# Patient Record
Sex: Female | Born: 1985 | Race: Black or African American | Hispanic: No | Marital: Single | Smoking: Current every day smoker
Health system: Southern US, Community
[De-identification: ages and names within clinical notes are randomized; demographics above are authoritative.]

## PROBLEM LIST (undated history)

## (undated) ENCOUNTER — Inpatient Hospital Stay (HOSPITAL_COMMUNITY): Payer: Self-pay

## (undated) DIAGNOSIS — R87629 Unspecified abnormal cytological findings in specimens from vagina: Secondary | ICD-10-CM

## (undated) DIAGNOSIS — K2970 Gastritis, unspecified, without bleeding: Secondary | ICD-10-CM

## (undated) DIAGNOSIS — R87619 Unspecified abnormal cytological findings in specimens from cervix uteri: Secondary | ICD-10-CM

## (undated) DIAGNOSIS — Z8619 Personal history of other infectious and parasitic diseases: Secondary | ICD-10-CM

## (undated) DIAGNOSIS — D649 Anemia, unspecified: Secondary | ICD-10-CM

## (undated) DIAGNOSIS — IMO0002 Reserved for concepts with insufficient information to code with codable children: Secondary | ICD-10-CM

## (undated) HISTORY — DX: Unspecified abnormal cytological findings in specimens from vagina: R87.629

## (undated) HISTORY — PX: HERNIA REPAIR: SHX51

## (undated) HISTORY — PX: LEEP: SHX91

## (undated) HISTORY — DX: Personal history of other infectious and parasitic diseases: Z86.19

## (undated) HISTORY — DX: Anemia, unspecified: D64.9

---
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
## 2003-03-26 ENCOUNTER — Emergency Department (HOSPITAL_COMMUNITY): Admission: AD | Admit: 2003-03-26 | Discharge: 2003-03-26 | Payer: Self-pay | Admitting: Family Medicine

## 2003-07-24 ENCOUNTER — Emergency Department (HOSPITAL_COMMUNITY): Admission: EM | Admit: 2003-07-24 | Discharge: 2003-07-24 | Payer: Self-pay | Admitting: Emergency Medicine

## 2003-10-25 ENCOUNTER — Inpatient Hospital Stay (HOSPITAL_COMMUNITY): Admission: AD | Admit: 2003-10-25 | Discharge: 2003-10-26 | Payer: Self-pay | Admitting: Family Medicine

## 2003-10-29 ENCOUNTER — Inpatient Hospital Stay (HOSPITAL_COMMUNITY): Admission: AD | Admit: 2003-10-29 | Discharge: 2003-10-30 | Payer: Self-pay | Admitting: Obstetrics and Gynecology

## 2003-10-30 ENCOUNTER — Observation Stay (HOSPITAL_COMMUNITY): Admission: AD | Admit: 2003-10-30 | Discharge: 2003-10-31 | Payer: Self-pay | Admitting: Obstetrics and Gynecology

## 2003-10-30 ENCOUNTER — Ambulatory Visit: Payer: Self-pay | Admitting: Obstetrics and Gynecology

## 2003-11-04 ENCOUNTER — Inpatient Hospital Stay (HOSPITAL_COMMUNITY): Admission: AD | Admit: 2003-11-04 | Discharge: 2003-11-04 | Payer: Self-pay | Admitting: Family Medicine

## 2003-11-06 ENCOUNTER — Inpatient Hospital Stay (HOSPITAL_COMMUNITY): Admission: AD | Admit: 2003-11-06 | Discharge: 2003-11-06 | Payer: Self-pay | Admitting: *Deleted

## 2004-01-10 ENCOUNTER — Ambulatory Visit (HOSPITAL_COMMUNITY): Admission: RE | Admit: 2004-01-10 | Discharge: 2004-01-10 | Payer: Self-pay | Admitting: *Deleted

## 2004-01-24 ENCOUNTER — Ambulatory Visit (HOSPITAL_COMMUNITY): Admission: RE | Admit: 2004-01-24 | Discharge: 2004-01-24 | Payer: Self-pay | Admitting: *Deleted

## 2004-02-02 ENCOUNTER — Inpatient Hospital Stay (HOSPITAL_COMMUNITY): Admission: AD | Admit: 2004-02-02 | Discharge: 2004-02-02 | Payer: Self-pay | Admitting: *Deleted

## 2004-03-25 ENCOUNTER — Inpatient Hospital Stay (HOSPITAL_COMMUNITY): Admission: AD | Admit: 2004-03-25 | Discharge: 2004-03-25 | Payer: Self-pay | Admitting: *Deleted

## 2004-04-03 ENCOUNTER — Ambulatory Visit: Payer: Self-pay | Admitting: Family Medicine

## 2004-04-17 ENCOUNTER — Ambulatory Visit: Payer: Self-pay | Admitting: Family Medicine

## 2004-04-28 ENCOUNTER — Ambulatory Visit (HOSPITAL_COMMUNITY): Admission: RE | Admit: 2004-04-28 | Discharge: 2004-04-28 | Payer: Self-pay | Admitting: *Deleted

## 2004-05-08 ENCOUNTER — Ambulatory Visit: Payer: Self-pay | Admitting: Family Medicine

## 2004-05-22 ENCOUNTER — Ambulatory Visit: Payer: Self-pay | Admitting: *Deleted

## 2004-05-29 ENCOUNTER — Ambulatory Visit: Payer: Self-pay | Admitting: Family Medicine

## 2004-06-05 ENCOUNTER — Ambulatory Visit: Payer: Self-pay | Admitting: Family Medicine

## 2004-06-12 ENCOUNTER — Ambulatory Visit: Payer: Self-pay | Admitting: Family Medicine

## 2004-06-16 ENCOUNTER — Ambulatory Visit: Payer: Self-pay | Admitting: *Deleted

## 2004-06-19 ENCOUNTER — Ambulatory Visit: Payer: Self-pay | Admitting: Family Medicine

## 2004-06-24 ENCOUNTER — Inpatient Hospital Stay (HOSPITAL_COMMUNITY): Admission: AD | Admit: 2004-06-24 | Discharge: 2004-06-28 | Payer: Self-pay | Admitting: Obstetrics and Gynecology

## 2004-06-24 ENCOUNTER — Ambulatory Visit: Payer: Self-pay | Admitting: Obstetrics and Gynecology

## 2004-07-04 ENCOUNTER — Inpatient Hospital Stay (HOSPITAL_COMMUNITY): Admission: AD | Admit: 2004-07-04 | Discharge: 2004-07-04 | Payer: Self-pay | Admitting: Obstetrics and Gynecology

## 2004-11-19 ENCOUNTER — Inpatient Hospital Stay (HOSPITAL_COMMUNITY): Admission: AD | Admit: 2004-11-19 | Discharge: 2004-11-19 | Payer: Self-pay | Admitting: Obstetrics and Gynecology

## 2005-04-03 ENCOUNTER — Inpatient Hospital Stay (HOSPITAL_COMMUNITY): Admission: AD | Admit: 2005-04-03 | Discharge: 2005-04-03 | Payer: Self-pay | Admitting: Obstetrics & Gynecology

## 2005-04-21 ENCOUNTER — Emergency Department (HOSPITAL_COMMUNITY): Admission: EM | Admit: 2005-04-21 | Discharge: 2005-04-21 | Payer: Self-pay | Admitting: Family Medicine

## 2005-07-26 ENCOUNTER — Inpatient Hospital Stay (HOSPITAL_COMMUNITY): Admission: AD | Admit: 2005-07-26 | Discharge: 2005-07-26 | Payer: Self-pay | Admitting: Obstetrics and Gynecology

## 2005-08-09 ENCOUNTER — Inpatient Hospital Stay (HOSPITAL_COMMUNITY): Admission: AD | Admit: 2005-08-09 | Discharge: 2005-08-09 | Payer: Self-pay | Admitting: Obstetrics and Gynecology

## 2005-10-15 ENCOUNTER — Ambulatory Visit (HOSPITAL_COMMUNITY): Admission: RE | Admit: 2005-10-15 | Discharge: 2005-10-15 | Payer: Self-pay | Admitting: Obstetrics

## 2006-02-23 ENCOUNTER — Inpatient Hospital Stay (HOSPITAL_COMMUNITY): Admission: AD | Admit: 2006-02-23 | Discharge: 2006-02-23 | Payer: Self-pay | Admitting: Obstetrics & Gynecology

## 2006-03-12 ENCOUNTER — Inpatient Hospital Stay (HOSPITAL_COMMUNITY): Admission: AD | Admit: 2006-03-12 | Discharge: 2006-03-12 | Payer: Self-pay | Admitting: Obstetrics

## 2006-03-24 ENCOUNTER — Inpatient Hospital Stay (HOSPITAL_COMMUNITY): Admission: AD | Admit: 2006-03-24 | Discharge: 2006-03-27 | Payer: Self-pay | Admitting: Obstetrics

## 2006-04-16 ENCOUNTER — Emergency Department (HOSPITAL_COMMUNITY): Admission: EM | Admit: 2006-04-16 | Discharge: 2006-04-16 | Payer: Self-pay | Admitting: Emergency Medicine

## 2006-09-12 ENCOUNTER — Emergency Department (HOSPITAL_COMMUNITY): Admission: EM | Admit: 2006-09-12 | Discharge: 2006-09-12 | Payer: Self-pay | Admitting: Emergency Medicine

## 2006-09-23 ENCOUNTER — Emergency Department (HOSPITAL_COMMUNITY): Admission: EM | Admit: 2006-09-23 | Discharge: 2006-09-23 | Payer: Self-pay | Admitting: Emergency Medicine

## 2007-07-08 ENCOUNTER — Emergency Department (HOSPITAL_COMMUNITY): Admission: EM | Admit: 2007-07-08 | Discharge: 2007-07-08 | Payer: Self-pay | Admitting: Emergency Medicine

## 2007-07-09 ENCOUNTER — Emergency Department (HOSPITAL_COMMUNITY): Admission: EM | Admit: 2007-07-09 | Discharge: 2007-07-09 | Payer: Self-pay | Admitting: Emergency Medicine

## 2007-11-27 ENCOUNTER — Emergency Department (HOSPITAL_COMMUNITY): Admission: EM | Admit: 2007-11-27 | Discharge: 2007-11-27 | Payer: Self-pay | Admitting: Family Medicine

## 2008-02-28 ENCOUNTER — Inpatient Hospital Stay (HOSPITAL_COMMUNITY): Admission: AD | Admit: 2008-02-28 | Discharge: 2008-02-28 | Payer: Self-pay | Admitting: Obstetrics & Gynecology

## 2008-08-28 ENCOUNTER — Inpatient Hospital Stay (HOSPITAL_COMMUNITY): Admission: AD | Admit: 2008-08-28 | Discharge: 2008-08-28 | Payer: Self-pay | Admitting: Obstetrics

## 2008-09-09 ENCOUNTER — Inpatient Hospital Stay (HOSPITAL_COMMUNITY): Admission: AD | Admit: 2008-09-09 | Discharge: 2008-09-09 | Payer: Self-pay | Admitting: Obstetrics & Gynecology

## 2008-09-09 ENCOUNTER — Ambulatory Visit: Payer: Self-pay | Admitting: Advanced Practice Midwife

## 2008-11-21 ENCOUNTER — Inpatient Hospital Stay (HOSPITAL_COMMUNITY): Admission: AD | Admit: 2008-11-21 | Discharge: 2008-11-21 | Payer: Self-pay | Admitting: Obstetrics

## 2008-11-27 ENCOUNTER — Emergency Department (HOSPITAL_COMMUNITY): Admission: EM | Admit: 2008-11-27 | Discharge: 2008-11-27 | Payer: Self-pay | Admitting: Emergency Medicine

## 2009-01-11 ENCOUNTER — Emergency Department (HOSPITAL_COMMUNITY): Admission: EM | Admit: 2009-01-11 | Discharge: 2009-01-11 | Payer: Self-pay | Admitting: Family Medicine

## 2009-03-07 ENCOUNTER — Emergency Department (HOSPITAL_COMMUNITY): Admission: EM | Admit: 2009-03-07 | Discharge: 2009-03-07 | Payer: Self-pay | Admitting: Emergency Medicine

## 2009-09-15 ENCOUNTER — Emergency Department (HOSPITAL_COMMUNITY)
Admission: EM | Admit: 2009-09-15 | Discharge: 2009-09-15 | Payer: Self-pay | Source: Home / Self Care | Admitting: Emergency Medicine

## 2010-04-16 LAB — CBC
HCT: 41.1 % (ref 36.0–46.0)
Hemoglobin: 13.7 g/dL (ref 12.0–15.0)
Platelets: 251 10*3/uL (ref 150–400)
RBC: 4.23 MIL/uL (ref 3.87–5.11)
WBC: 9.8 10*3/uL (ref 4.0–10.5)

## 2010-04-16 LAB — POCT PREGNANCY, URINE
Preg Test, Ur: NEGATIVE
Preg Test, Ur: NEGATIVE

## 2010-04-16 LAB — WET PREP, GENITAL
Trich, Wet Prep: NONE SEEN
Yeast Wet Prep HPF POC: NONE SEEN

## 2010-04-16 LAB — URINALYSIS, ROUTINE W REFLEX MICROSCOPIC
Bilirubin Urine: NEGATIVE
Bilirubin Urine: NEGATIVE
Glucose, UA: NEGATIVE mg/dL
Glucose, UA: NEGATIVE mg/dL
Ketones, ur: NEGATIVE mg/dL
Ketones, ur: NEGATIVE mg/dL
Nitrite: NEGATIVE
Nitrite: NEGATIVE
Protein, ur: 100 mg/dL — AB
Protein, ur: NEGATIVE mg/dL
Specific Gravity, Urine: 1.019 (ref 1.005–1.030)
Specific Gravity, Urine: 1.015 (ref 1.005–1.030)
Urobilinogen, UA: 1 mg/dL (ref 0.0–1.0)
Urobilinogen, UA: 0.2 mg/dL (ref 0.0–1.0)
pH: 7.5 (ref 5.0–8.0)
pH: 6.5 (ref 5.0–8.0)

## 2010-04-16 LAB — GC/CHLAMYDIA PROBE AMP, GENITAL
Chlamydia, DNA Probe: NEGATIVE
GC Probe Amp, Genital: NEGATIVE

## 2010-04-16 LAB — URINE MICROSCOPIC-ADD ON

## 2010-04-19 LAB — URINALYSIS, ROUTINE W REFLEX MICROSCOPIC
Bilirubin Urine: NEGATIVE
Nitrite: NEGATIVE
Specific Gravity, Urine: >1.03 — ABNORMAL HIGH (ref 1.005–1.030)
pH: 6 (ref 5.0–8.0)

## 2010-04-19 LAB — URINE CULTURE

## 2010-04-19 LAB — URINE MICROSCOPIC-ADD ON

## 2010-04-19 LAB — GC/CHLAMYDIA PROBE AMP, GENITAL: GC Probe Amp, Genital: POSITIVE — AB

## 2010-04-19 LAB — WET PREP, GENITAL: Yeast Wet Prep HPF POC: NONE SEEN

## 2010-04-29 LAB — URINALYSIS, ROUTINE W REFLEX MICROSCOPIC
Bilirubin Urine: NEGATIVE
Glucose, UA: NEGATIVE mg/dL
Hgb urine dipstick: NEGATIVE
Ketones, ur: NEGATIVE mg/dL
Nitrite: NEGATIVE
pH: 6.5 (ref 5.0–8.0)

## 2010-04-29 LAB — WET PREP, GENITAL
Clue Cells Wet Prep HPF POC: NONE SEEN
Trich, Wet Prep: NONE SEEN
Yeast Wet Prep HPF POC: NONE SEEN

## 2010-04-29 LAB — POCT PREGNANCY, URINE: Preg Test, Ur: NEGATIVE
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
## 2010-05-30 NOTE — Op Note (Signed)
[facility]  Patient:    Dawn Brock, Dawn Brock Visit Number: [id number] MRN: [id number]          Service Type: DSU Location: [facility] Attending Physician:  Jaymes Graff A Dictated by:   Pierre Bali Normand Sloop, M.D. Proc. Date: 04/28/01 Admit Date:  04/28/2001                             Operative Report  PREOPERATIVE DIAGNOSIS:       Cervical dysplasia.  POSTOPERATIVE DIAGNOSIS:      Cervical dysplasia.  OPERATION:                    1. Loop electrosurgical excision procedure.                               2. Endocervical curettage.  SURGEON:                      Naima A. Normand Sloop, M.D.  ANESTHESIA:                   MAC with 10 cc 1% lidocaine cervical block.  ESTIMATED BLOOD LOSS:         Minimal.  COMPLICATIONS:                None.  FINDINGS:                     Normal size uterus, mobile.  No adnexal masses. Vulva and vaginal exams within normal limits.  There was an abnormal lightening area with Lugol solution at 12, 1, and 6 oclock.  The patient went to the recovery room in stable condition.  DESCRIPTION OF PROCEDURE:     The patient was taken to the operating room where she was given MAC anesthesia with a cervical block after being placed in dorsolithotomy position and prepped and draped in the normal sterile fashion. A bivalve speculum was placed into the vagina.  The anterior lip of the cervix was grasped with a single-tooth tenaculum.  The 10 cc 1% lidocaine cervical block was administered.  Lugol solution was placed on the cervix.  The abnormal areas were consistent with colposcopy done in the office.  The abnormal areas were removed in entirety with loop cautery.  The endocervical curet was placed into the cervix, and endocervical curettage was done. Specimen was sent to pathology.  The cervical bed was made hemostatic using Bovie cautery and mild salt solution.  All instruments were removed from the vagina.  Tenaculum was removed from the  cervix with good hemostasis noted. Sponge counts were correct x 2.  The patient was taken to the recovery room in stable condition. Dictated by:   Pierre Bali. Normand Sloop, M.D. Attending Physician:  Michael Litter DD:  04/28/01 TD:  04/29/01 Job: [id number]

## 2010-05-30 NOTE — Discharge Summary (Signed)
NAME:  Dawn Brock, Dawn Brock   ACCOUNT NO.:  [id number]   MEDICAL RECORD NO.:  [id number]          PATIENT TYPE:  INP   LOCATION:  9313                          FACILITY:  [facility]   PHYSICIAN:  Phil D. Okey Dupre, M.D.     DATE OF BIRTH:  11/14/1985   DATE OF ADMISSION:  10/30/2003  DATE OF DISCHARGE:                                 DISCHARGE SUMMARY   ADMISSION DIAGNOSES:  1.  A 25 year old gravida 1 at 7 and 3 weeks with emesis status post      treatment for gonorrhea and chlamydia.  2.  No history of hyperemesis with this pregnancy.   DISCHARGE DIAGNOSES:  1.  A 25 year old gravida 1 at 7 and 3 weeks with emesis.  2.  Positive gonorrhea and chlamydia.   DISCHARGE MEDICATIONS:  1.  Phenergan 25 mg q.6h. p.r.n. #30 with one refill.  2.  Zofran 4 mg q.6h. p.r.n. #30 no refills.  3.  Prenatal vitamins one p.o. daily.   ADMISSION HISTORY:  Ms. [name] was admitted at 7 weeks with emesis.  She has no history of hyperemesis and was given a Zithromax slurry as well  as an IM injection of Rocephin and she promptly vomited and continued to  vomit throughout the day in maternity admissions.  She was admitted for  observation.  She was given IV fluids and Phenergan.  This seemed to help  her vomiting, although she had persistent nausea.   The patient was discharged to home in stable condition.   INSTRUCTIONS GIVEN TO PATIENT:  The patient was told of her above medical  regimen.  She should call [facility] for a prenatal appointment to start  prenatal care as soon as possible.   As far as her gonorrhea and chlamydia, her gonorrhea was appropriately  treated with Rocephin and she was then given a gram of Zithromax IV for  chlamydia prior to discharge.      LC/MEDQ  D:  10/31/2003  T:  10/31/2003  Job:  [id number]   cc:   [facility] on [address]

## 2010-08-09 ENCOUNTER — Inpatient Hospital Stay (HOSPITAL_COMMUNITY)
Admission: AD | Admit: 2010-08-09 | Discharge: 2010-08-09 | Disposition: A | Discharge status: Home / Self Care | Payer: Medicaid Other | Source: Ambulatory Visit | Attending: Obstetrics & Gynecology | Admitting: Obstetrics & Gynecology

## 2010-08-09 ENCOUNTER — Encounter (HOSPITAL_COMMUNITY): Payer: Self-pay | Admitting: *Deleted

## 2010-08-09 DIAGNOSIS — N898 Other specified noninflammatory disorders of vagina: Secondary | ICD-10-CM | POA: Insufficient documentation

## 2010-08-09 DIAGNOSIS — R109 Unspecified abdominal pain: Secondary | ICD-10-CM | POA: Insufficient documentation

## 2010-08-09 DIAGNOSIS — A599 Trichomoniasis, unspecified: Secondary | ICD-10-CM

## 2010-08-09 LAB — URINALYSIS, ROUTINE W REFLEX MICROSCOPIC
Ketones, ur: NEGATIVE mg/dL
Nitrite: NEGATIVE
Protein, ur: NEGATIVE mg/dL
Urobilinogen, UA: 1 mg/dL (ref 0.0–1.0)

## 2010-08-09 LAB — URINE MICROSCOPIC-ADD ON

## 2010-08-09 LAB — WET PREP, GENITAL: Yeast Wet Prep HPF POC: NONE SEEN

## 2010-08-09 LAB — CBC
MCH: 30.7 pg (ref 26.0–34.0)
MCHC: 32.8 g/dL (ref 30.0–36.0)
RDW: 14.3 % (ref 11.5–15.5)

## 2010-08-09 MED ORDER — NAPROXEN SODIUM 550 MG PO TABS: 550.0000 mg | ORAL_TABLET | Freq: Two times a day (BID) | ORAL | Status: DC

## 2010-08-09 MED ORDER — METRONIDAZOLE 500 MG PO TABS: 500.0000 mg | ORAL_TABLET | Freq: Two times a day (BID) | ORAL | Status: AC

## 2010-08-09 MED ORDER — KETOROLAC TROMETHAMINE 60 MG/2ML IM SOLN
60.0000 mg | Freq: Once | INTRAMUSCULAR | Status: AC
  Administered 2010-08-09: 60 mg via INTRAMUSCULAR
  Filled 2010-08-09: qty 2
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
## 2010-08-09 NOTE — Progress Notes (Signed)
Pt states, " I had a regular period 7-19 that lasted for 5 days having normal cramping pain, and then I restarted on Thurs and using about five pads per day. The cramping has never stopped from the 07-31-10. After my period stopped I had a yellow discharge."

## 2010-08-09 NOTE — ED Provider Notes (Signed)
Agree with above note.  Luise Yamamoto A 08/09/2010 9:18 PM   

## 2010-08-09 NOTE — Progress Notes (Signed)
Pt stateds she had  period on 7/19 and it lasted 5 days and started bleeding again 2 days ago

## 2010-08-11 NOTE — Treatment Plan (Signed)
Pt called for another RX of Flagyl, pt states she lost her RX. Flagyl 500mg  1 tab bid #14 No refills. Called into [facility] on [address].

## 2010-08-12 LAB — GC/CHLAMYDIA PROBE AMP, GENITAL: Chlamydia, DNA Probe: NEGATIVE

## 2010-09-14 ENCOUNTER — Emergency Department (HOSPITAL_COMMUNITY)
Admission: EM | Admit: 2010-09-14 | Discharge: 2010-09-14 | Disposition: A | Discharge status: Home / Self Care | Payer: Medicaid Other | Attending: Emergency Medicine | Admitting: Emergency Medicine

## 2010-09-14 DIAGNOSIS — M25539 Pain in unspecified wrist: Secondary | ICD-10-CM | POA: Insufficient documentation

## 2010-09-14 DIAGNOSIS — S4980XA Other specified injuries of shoulder and upper arm, unspecified arm, initial encounter: Secondary | ICD-10-CM | POA: Insufficient documentation

## 2010-09-14 DIAGNOSIS — M79609 Pain in unspecified limb: Secondary | ICD-10-CM | POA: Insufficient documentation

## 2010-09-14 DIAGNOSIS — R51 Headache: Secondary | ICD-10-CM | POA: Insufficient documentation

## 2010-09-14 DIAGNOSIS — S46909A Unspecified injury of unspecified muscle, fascia and tendon at shoulder and upper arm level, unspecified arm, initial encounter: Secondary | ICD-10-CM | POA: Insufficient documentation

## 2010-09-14 DIAGNOSIS — IMO0002 Reserved for concepts with insufficient information to code with codable children: Secondary | ICD-10-CM | POA: Insufficient documentation

## 2010-10-09 LAB — STREP A DNA PROBE

## 2010-10-09 LAB — RAPID STREP SCREEN (MED CTR MEBANE ONLY): Streptococcus, Group A Screen (Direct): NEGATIVE

## 2010-10-09 LAB — POCT INFECTIOUS MONO SCREEN: Mono Screen: NEGATIVE

## 2011-02-05 ENCOUNTER — Emergency Department (HOSPITAL_COMMUNITY)
Admission: EM | Admit: 2011-02-05 | Discharge: 2011-02-05 | Disposition: A | Discharge status: Home / Self Care | Payer: Medicaid Other | Attending: Emergency Medicine | Admitting: Emergency Medicine

## 2011-02-05 ENCOUNTER — Encounter (HOSPITAL_COMMUNITY): Payer: Self-pay | Admitting: Emergency Medicine

## 2011-02-05 DIAGNOSIS — F17200 Nicotine dependence, unspecified, uncomplicated: Secondary | ICD-10-CM | POA: Insufficient documentation

## 2011-02-05 DIAGNOSIS — R599 Enlarged lymph nodes, unspecified: Secondary | ICD-10-CM | POA: Insufficient documentation

## 2011-02-05 DIAGNOSIS — J029 Acute pharyngitis, unspecified: Secondary | ICD-10-CM | POA: Insufficient documentation

## 2011-02-05 MED ORDER — HYDROCODONE-ACETAMINOPHEN 5-325 MG PO TABS
2.0000 | ORAL_TABLET | Freq: Once | ORAL | Status: AC
  Administered 2011-02-05: 2 via ORAL
  Filled 2011-02-05: qty 2

## 2011-02-05 MED ORDER — HYDROCODONE-ACETAMINOPHEN 5-325 MG PO TABS: 2.0000 | ORAL_TABLET | ORAL | Status: AC | PRN

## 2011-02-05 MED ORDER — PENICILLIN V POTASSIUM 500 MG PO TABS: 500.0000 mg | ORAL_TABLET | Freq: Three times a day (TID) | ORAL | Status: AC

## 2011-02-05 NOTE — ED Notes (Signed)
Pt alert, nad, c/o sore throat, "knot" in throat, onset several days ago,  resp even unlabored, skin pwd

## 2011-02-05 NOTE — ED Provider Notes (Signed)
History     CSN: [id number]  Arrival date & time 02/05/11  1907   First MD Initiated Contact with Patient 02/05/11 2025      Chief Complaint  Patient presents with  . Sore Throat  . Lymphadenopathy    (Consider location/radiation/quality/duration/timing/severity/associated sxs/prior treatment) HPI  Pt here from home with complaints of sore throat, fever, and "swollen tonsils". She states the symptoms have been going on for a couple of days now and she can't take the pain anymore. She denies a history recurrent sore throats.Denies neck pain, headaches, N/V/D, body aches, weakness, chills.  Past Medical History  Diagnosis Date  . No pertinent past medical history     Past Surgical History  Procedure Date  . Leep     Family History  Problem Relation Age of Onset  . Hypertension Mother     History  Substance Use Topics  . Smoking status: Current Everyday Smoker -- 0.5 packs/day  . Smokeless tobacco: Not on file  . Alcohol Use: 7.0 oz/week    14 drink(s) per week    OB History    Grav Para Term Preterm Abortions TAB SAB Ect Mult Living   2 2 0 0 0 0 0 0 0 2       Review of Systems  All other systems reviewed and are negative.    Allergies  Review of patient's allergies indicates no known allergies.  Home Medications   Current Outpatient Rx  Name Route Sig Dispense Refill  . HYDROCODONE-ACETAMINOPHEN 5-325 MG PO TABS Oral Take 2 tablets by mouth every 4 (four) hours as needed for pain. 6 tablet 0  . PENICILLIN V POTASSIUM 500 MG PO TABS Oral Take 1 tablet (500 mg total) by mouth 3 (three) times daily. 30 tablet 0    BP 117/81  Pulse 68  Temp(Src) 98.2 F (36.8 C) (Oral)  Resp 20  SpO2 99%  LMP 02/03/2011  Physical Exam  Nursing note and vitals reviewed. Constitutional: She appears well-developed and well-nourished.  HENT:  Head: Normocephalic and atraumatic.  Mouth/Throat: Uvula is midline and mucous membranes are normal. Oropharyngeal exudate  present.  Eyes: Conjunctivae are normal. Pupils are equal, round, and reactive to light.  Neck: Trachea normal, normal range of motion and full passive range of motion without pain. Neck supple.  Cardiovascular: Normal rate, regular rhythm and normal pulses.   Pulmonary/Chest: Effort normal. Chest wall is not dull to percussion. She exhibits no tenderness, no crepitus, no edema, no deformity and no retraction.  Abdominal: Soft. Normal appearance.  Musculoskeletal: Normal range of motion.  Neurological: She is alert. She has normal strength.  Skin: Skin is warm, dry and intact.  Psychiatric: Her speech is normal. Cognition and memory are normal.    ED Course  Procedures (including critical care time)   Labs Reviewed  RAPID STREP SCREEN   No results found.   1. Pharyngitis       MDM  Pt has tonsilar exudates. Will give abx and pain medication. Pt to return to ED if symptoms change or worsen.        Dorthula Matas, PA 02/05/11 2231

## 2011-02-06 NOTE — ED Provider Notes (Signed)
Medical screening examination/treatment/procedure(s) were performed by non-physician practitioner and as supervising physician I was immediately available for consultation/collaboration. Devoria Albe, MD, Armando Gang   Ward Givens, MD 02/06/11 [phone]

## 2011-05-25 ENCOUNTER — Encounter (HOSPITAL_COMMUNITY): Payer: Self-pay | Admitting: Emergency Medicine

## 2011-05-25 ENCOUNTER — Emergency Department (HOSPITAL_COMMUNITY)
Admission: EM | Admit: 2011-05-25 | Discharge: 2011-05-25 | Disposition: A | Discharge status: Home / Self Care | Payer: Self-pay | Attending: Emergency Medicine | Admitting: Emergency Medicine

## 2011-05-25 DIAGNOSIS — S6990XA Unspecified injury of unspecified wrist, hand and finger(s), initial encounter: Secondary | ICD-10-CM | POA: Insufficient documentation

## 2011-05-25 DIAGNOSIS — IMO0002 Reserved for concepts with insufficient information to code with codable children: Secondary | ICD-10-CM

## 2011-05-25 DIAGNOSIS — S61209A Unspecified open wound of unspecified finger without damage to nail, initial encounter: Secondary | ICD-10-CM | POA: Insufficient documentation

## 2011-05-25 DIAGNOSIS — M79609 Pain in unspecified limb: Secondary | ICD-10-CM | POA: Insufficient documentation

## 2011-05-25 DIAGNOSIS — W268XXA Contact with other sharp object(s), not elsewhere classified, initial encounter: Secondary | ICD-10-CM | POA: Insufficient documentation

## 2011-05-25 MED ORDER — IBUPROFEN 800 MG PO TABS
800.0000 mg | ORAL_TABLET | Freq: Once | ORAL | Status: AC
  Administered 2011-05-25: 800 mg via ORAL
  Filled 2011-05-25: qty 1

## 2011-05-25 NOTE — ED Notes (Signed)
Cut left middle finger on a piece of tile

## 2011-05-25 NOTE — ED Notes (Signed)
NP at bedside.

## 2011-05-25 NOTE — Progress Notes (Signed)
Orthopedic Tech Progress Note Patient Details:  Dawn Brock 06/10/1985 [id number]  Type of Splint: Finger Splint Location: (L) UE Splint Interventions: Application    Jennye Moccasin 05/25/2011, 5:51 PM

## 2011-05-25 NOTE — Discharge Instructions (Signed)
Ms. Teague we glued a laceration on her finger then we applied went keep the splint on for one or 2 days to make sure the laceration does not break open again. Return to the ER for severe pain. He can take ibuprofen 800 for pain. Followup with your PCP otherwise if you need to  Laceration Care, Adult A laceration is a cut that goes through all layers of the skin. The cut goes into the tissue beneath the skin. HOME CARE For stitches (sutures) or staples:  Keep the cut clean and dry.   If you have a bandage (dressing), change it at least once a day. Change the bandage if it gets wet or dirty, or as told by your doctor.   Wash the cut with soap and water 2 times a day. Rinse the cut with water. Pat it dry with a clean towel.   Put a thin layer of medicated cream on the cut as told by your doctor.   You may shower after the first 24 hours. Do not soak the cut in water until the stitches are removed.   Only take medicines as told by your doctor.   Have your stitches or staples removed as told by your doctor.  For skin adhesive strips:  Keep the cut clean and dry.   Do not get the strips wet. You may take a bath, but be careful to keep the cut dry.   If the cut gets wet, pat it dry with a clean towel.   The strips will fall off on their own. Do not remove the strips that are still stuck to the cut.  For wound glue:  You may shower or take baths. Do not soak or scrub the cut. Do not swim. Avoid heavy sweating until the glue falls off on its own. After a shower or bath, pat the cut dry with a clean towel.   Do not put medicine on your cut until the glue falls off.   If you have a bandage, do not put tape over the glue.   Avoid lots of sunlight or tanning lamps until the glue falls off. Put sunscreen on the cut for the first year to reduce your scar.   The glue will fall off on its own. Do not pick at the glue.  You may need a tetanus shot if:  You cannot remember when you had your  last tetanus shot.   You have never had a tetanus shot.  If you need a tetanus shot and you choose not to have one, you may get tetanus. Sickness from tetanus can be serious. GET HELP RIGHT AWAY IF:   Your pain does not get better with medicine.   Your arm, hand, leg, or foot loses feeling (numbness) or changes color.   Your cut is bleeding.   Your joint feels weak, or you cannot use your joint.   You have painful lumps on your body.   Your cut is red, puffy (swollen), or painful.   You have a red line on the skin near the cut.   You have yellowish-white fluid (pus) coming from the cut.   You have a fever.   You have a bad smell coming from the cut or bandage.   Your cut breaks open before or after stitches are removed.   You notice something coming out of the cut, such as wood or glass.   You cannot move a finger or toe.  MAKE SURE YOU:  Understand these instructions.   Will watch your condition.   Will get help right away if you are not doing well or get worse.  Document Released: 06/17/2007 Document Revised: 12/18/2010 Document Reviewed: 06/24/2010 [facility] Patient Information 2012 [place], [place].

## 2011-05-26 NOTE — ED Provider Notes (Signed)
History     CSN: [id number]  Arrival date & time 05/25/11  1456   First MD Initiated Contact with Patient 05/25/11 1625      Chief Complaint  Patient presents with  . Finger Injury    (Consider location/radiation/quality/duration/timing/severity/associated sxs/prior treatment) Patient is a 26 y.o. female presenting with skin laceration. The history is provided by the patient. No language interpreter was used.  Laceration  The incident occurred 3 to 5 hours ago. The laceration is located on the left hand. The laceration is 2 cm in size. Injury mechanism: tile. The pain is at a severity of 4/10. The pain is mild. The pain has been constant since onset. She reports no foreign bodies present. Her tetanus status is UTD.  Laceration to L middle finger on tile in the bathroom.  No pmh.  Past Medical History  Diagnosis Date  . No pertinent past medical history     Past Surgical History  Procedure Date  . Leep     Family History  Problem Relation Age of Onset  . Hypertension Mother     History  Substance Use Topics  . Smoking status: Current Everyday Smoker -- 0.5 packs/day  . Smokeless tobacco: Not on file  . Alcohol Use: 7.0 oz/week    14 drink(s) per week    OB History    Grav Para Term Preterm Abortions TAB SAB Ect Mult Living   2 2 0 0 0 0 0 0 0 2       Review of Systems  Constitutional: Negative.   HENT: Negative.   Eyes: Negative.   Respiratory: Negative.   Cardiovascular: Negative.   Gastrointestinal: Negative.   Skin:       Lsceration  Neurological: Negative.   Psychiatric/Behavioral: Negative.   All other systems reviewed and are negative.    Allergies  Review of patient's allergies indicates no known allergies.  Home Medications  No current outpatient prescriptions on file.  BP 126/73  Pulse 94  Temp(Src) 98.1 F (36.7 C) (Oral)  Resp 16  SpO2 98%  Physical Exam  Nursing note and vitals reviewed. Constitutional: She is oriented to  person, place, and time. She appears well-developed and well-nourished.  HENT:  Head: Normocephalic and atraumatic.  Eyes: Conjunctivae and EOM are normal. Pupils are equal, round, and reactive to light.  Neck: Normal range of motion. Neck supple.  Cardiovascular: Normal rate.   Pulmonary/Chest: Effort normal.  Abdominal: Soft.  Musculoskeletal: Normal range of motion. She exhibits no edema and no tenderness.  Neurological: She is alert and oriented to person, place, and time. She has normal reflexes.  Skin: Skin is warm and dry.       2cm lacer superficial to middle knuckle Bleeding controled.   Psychiatric: She has a normal mood and affect.    ED Course  Procedures (including critical care time)  Labs Reviewed - No data to display No results found.   1. Laceration       MDM  Superficial 2cm laceration to L middle finger repaired with steri-strip and dermabond.  Tetanus utd.  Splint x 2 days.  Return if worse.         Remi Haggard, NP 05/26/11 1527

## 2011-05-27 NOTE — ED Provider Notes (Signed)
Medical screening examination/treatment/procedure(s) were performed by non-physician practitioner and as supervising physician I was immediately available for consultation/collaboration.  Chiana Wamser, MD 05/27/11 1620 

## 2011-06-13 ENCOUNTER — Emergency Department (HOSPITAL_COMMUNITY)
Admission: EM | Admit: 2011-06-13 | Discharge: 2011-06-14 | Disposition: A | Discharge status: Home / Self Care | Payer: Self-pay | Attending: Emergency Medicine | Admitting: Emergency Medicine

## 2011-06-13 ENCOUNTER — Encounter (HOSPITAL_COMMUNITY): Payer: Self-pay | Admitting: Emergency Medicine

## 2011-06-13 DIAGNOSIS — F411 Generalized anxiety disorder: Secondary | ICD-10-CM | POA: Insufficient documentation

## 2011-06-13 DIAGNOSIS — R10817 Generalized abdominal tenderness: Secondary | ICD-10-CM | POA: Insufficient documentation

## 2011-06-13 DIAGNOSIS — K2920 Alcoholic gastritis without bleeding: Secondary | ICD-10-CM

## 2011-06-13 LAB — URINALYSIS, ROUTINE W REFLEX MICROSCOPIC
Bilirubin Urine: NEGATIVE
Nitrite: NEGATIVE
Protein, ur: 30 mg/dL — AB
Specific Gravity, Urine: 1.03 (ref 1.005–1.030)
Urobilinogen, UA: 0.2 mg/dL (ref 0.0–1.0)

## 2011-06-13 LAB — DIFFERENTIAL
Eosinophils Absolute: 0 10*3/uL (ref 0.0–0.7)
Eosinophils Relative: 0 % (ref 0–5)
Lymphs Abs: 1.7 10*3/uL (ref 0.7–4.0)
Monocytes Absolute: 0.6 10*3/uL (ref 0.1–1.0)
Monocytes Relative: 5 % (ref 3–12)

## 2011-06-13 LAB — CBC
HCT: 41.6 % (ref 36.0–46.0)
Hemoglobin: 14.1 g/dL (ref 12.0–15.0)
MCH: 32.8 pg (ref 26.0–34.0)
MCV: 96.7 fL (ref 78.0–100.0)
Platelets: 208 10*3/uL (ref 150–400)
RBC: 4.3 MIL/uL (ref 3.87–5.11)

## 2011-06-13 LAB — COMPREHENSIVE METABOLIC PANEL
BUN: 8 mg/dL (ref 6–23)
CO2: 23 mEq/L (ref 19–32)
Calcium: 9.3 mg/dL (ref 8.4–10.5)
GFR calc Af Amer: >90 mL/min (ref >90)
GFR calc non Af Amer: >90 mL/min (ref >90)
Glucose, Bld: 101 mg/dL — ABNORMAL HIGH (ref 70–99)
Total Protein: 7.8 g/dL (ref 6.0–8.3)

## 2011-06-13 LAB — LIPASE, BLOOD: Lipase: 12 U/L (ref 11–59)

## 2011-06-13 LAB — POCT PREGNANCY, URINE: Preg Test, Ur: NEGATIVE

## 2011-06-13 LAB — URINE MICROSCOPIC-ADD ON

## 2011-06-13 MED ORDER — METOCLOPRAMIDE HCL 5 MG/ML IJ SOLN
10.0000 mg | Freq: Once | INTRAMUSCULAR | Status: AC
  Administered 2011-06-13: 10 mg via INTRAVENOUS
  Filled 2011-06-13: qty 2

## 2011-06-13 MED ORDER — PANTOPRAZOLE SODIUM 40 MG IV SOLR
40.0000 mg | Freq: Once | INTRAVENOUS | Status: AC
  Administered 2011-06-13: 40 mg via INTRAVENOUS
  Filled 2011-06-13: qty 40

## 2011-06-13 MED ORDER — ONDANSETRON HCL 4 MG/2ML IJ SOLN
INTRAMUSCULAR | Status: AC
  Administered 2011-06-13: 18:00:00
  Filled 2011-06-13: qty 2

## 2011-06-13 MED ORDER — LORAZEPAM 2 MG/ML IJ SOLN
0.5000 mg | Freq: Once | INTRAMUSCULAR | Status: AC
  Administered 2011-06-13: 0.5 mg via INTRAVENOUS
  Filled 2011-06-13: qty 1

## 2011-06-13 MED ORDER — ONDANSETRON HCL 4 MG/2ML IJ SOLN
4.0000 mg | Freq: Once | INTRAMUSCULAR | Status: AC
  Administered 2011-06-13: 4 mg via INTRAVENOUS
  Filled 2011-06-13: qty 2

## 2011-06-13 MED ORDER — PROMETHAZINE HCL 25 MG PO TABS: 25.0000 mg | ORAL_TABLET | Freq: Four times a day (QID) | ORAL | Status: DC | PRN

## 2011-06-13 MED ORDER — SUCRALFATE 1 G PO TABS: 1.0000 g | ORAL_TABLET | Freq: Four times a day (QID) | ORAL | Status: DC

## 2011-06-13 MED ORDER — SODIUM CHLORIDE 0.9 % IV SOLN
Freq: Once | INTRAVENOUS | Status: AC
  Administered 2011-06-13: 21:00:00 via INTRAVENOUS

## 2011-06-13 MED ORDER — PANTOPRAZOLE SODIUM 20 MG PO TBEC: 20.0000 mg | DELAYED_RELEASE_TABLET | Freq: Every day | ORAL | Status: DC

## 2011-06-13 MED ORDER — SUCRALFATE 1 G PO TABS
1.0000 g | ORAL_TABLET | Freq: Once | ORAL | Status: AC
  Administered 2011-06-13: 1 g via ORAL
  Filled 2011-06-13: qty 1

## 2011-06-13 MED ORDER — LORAZEPAM 2 MG/ML IJ SOLN
1.0000 mg | Freq: Once | INTRAMUSCULAR | Status: AC
  Administered 2011-06-13: 1 mg via INTRAVENOUS
  Filled 2011-06-13: qty 1

## 2011-06-13 MED ORDER — HYDROMORPHONE HCL PF 1 MG/ML IJ SOLN
0.5000 mg | Freq: Once | INTRAMUSCULAR | Status: AC
  Administered 2011-06-13: 0.5 mg via INTRAVENOUS
  Filled 2011-06-13: qty 1

## 2011-06-13 MED ORDER — DIPHENHYDRAMINE HCL 50 MG/ML IJ SOLN
25.0000 mg | Freq: Once | INTRAMUSCULAR | Status: AC
  Administered 2011-06-13: 25 mg via INTRAVENOUS
  Filled 2011-06-13: qty 1

## 2011-06-13 MED ORDER — SODIUM CHLORIDE 0.9 % IV BOLUS (SEPSIS)
1000.0000 mL | Freq: Once | INTRAVENOUS | Status: AC
  Administered 2011-06-13: 1000 mL via INTRAVENOUS

## 2011-06-13 MED ORDER — GI COCKTAIL ~~LOC~~
30.0000 mL | Freq: Once | ORAL | Status: AC
  Administered 2011-06-13: 30 mL via ORAL
  Filled 2011-06-13: qty 30

## 2011-06-13 NOTE — ED Notes (Signed)
Pt. Was vomiting after taking  Sucralfate Po tab, MD notified. pending for discharge for now, kept monitored.
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
## 2011-06-13 NOTE — ED Notes (Signed)
Per EMS-4mg  Zofran and fluid bolus given

## 2011-06-13 NOTE — ED Notes (Signed)
Pt notified of the need for urine. States that she is unable to get up and provide a specimen at this time

## 2011-06-13 NOTE — Discharge Instructions (Signed)
Gastritis Gastritis is an inflammation (the body's way of reacting to injury and/or infection) of the stomach. It is often caused by viral or bacterial (germ) infections. It can also be caused by chemicals (including alcohol) and medications. This illness may be associated with generalized malaise (feeling tired, not well), cramps, and fever. The illness may last 2 to 7 days. If symptoms of gastritis continue, gastroscopy (looking into the stomach with a telescope-like instrument), biopsy (taking tissue samples), and/or blood tests may be necessary to determine the cause. Antibiotics will not affect the illness unless there is a bacterial infection present. One common bacterial cause of gastritis is an organism known as H. Pylori. This can be treated with antibiotics. Other forms of gastritis are caused by too much acid in the stomach. They can be treated with medications such as H2 blockers and antacids. Home treatment is usually all that is needed. Young children will quickly become dehydrated (loss of body fluids) if vomiting and diarrhea are both present. Medications may be given to control nausea. Medications are usually not given for diarrhea unless especially bothersome. Some medications slow the removal of the virus from the gastrointestinal tract. This slows down the healing process. HOME CARE INSTRUCTIONS Home care instructions for nausea and vomiting:  For adults: drink small amounts of fluids often. Drink at least 2 quarts a day. Take sips frequently. Do not drink large amounts of fluid at one time. This may worsen the nausea.   Only take over-the-counter or prescription medicines for pain, discomfort, or fever as directed by your caregiver.   Drink clear liquids only. Those are anything you can see through such as water, broth, or soft drinks.   Once you are keeping clear liquids down, you may start full liquids, soups, juices, and ice cream or sherbet. Slowly add bland (plain, not spicy)  foods to your diet.  Home care instructions for diarrhea:  Diarrhea can be caused by bacterial infections or a virus. Your condition should improve with time, rest, fluids, and/or anti-diarrheal medication.   Until your diarrhea is under control, you should drink clear liquids often in small amounts. Clear liquids include: water, broth, jell-o water and weak tea.  Avoid:  Milk.   Fruits.   Tobacco.   Alcohol.   Extremely hot or cold fluids.   Too much intake of anything at one time.  When your diarrhea stops you may add the following foods, which help the stool to become more formed:  Rice.   Bananas.   Apples without skin.   Dry toast.  Once these foods are tolerated you may add low-fat yogurt and low-fat cottage cheese. They will help to restore the normal bacterial balance in your bowel. Wash your hands well to avoid spreading bacteria (germ) or virus. SEEK IMMEDIATE MEDICAL CARE IF:   You are unable to keep fluids down.   Vomiting or diarrhea become persistent (constant).   Abdominal pain develops, increases, or localizes. (Right sided pain can be appendicitis. Left sided pain in adults can be diverticulitis.)   You develop a fever (an oral temperature above 102 F (38.9 C)).   Diarrhea becomes excessive or contains blood or mucus.   You have excessive weakness, dizziness, fainting or extreme thirst.   You are not improving or you are getting worse.   You have any other questions or concerns.  Document Released: 12/23/2000 Document Revised: 12/18/2010 Document Reviewed: 12/29/2004 [employer] Patient Information 2012 [employer]. 

## 2011-06-13 NOTE — ED Notes (Signed)
Pt laying on rt side, spitting into emesis bag. Reports heaving drinking of beer and liquor last night into early morning.

## 2011-06-13 NOTE — ED Notes (Signed)
[id number]<AV> Expected date:06/13/11<BR> Expected time:<BR> Means of arrival:<BR> Comments:<BR> EMS 41 GC - n/v

## 2011-06-13 NOTE — ED Provider Notes (Signed)
History     CSN: [id number]  Arrival date & time 06/13/11  1803   First MD Initiated Contact with Patient 06/13/11 1848      Chief Complaint  Patient presents with  . Nausea  . Emesis    (Consider location/radiation/quality/duration/timing/severity/associated sxs/prior treatment) Patient is a 26 y.o. female presenting with vomiting. The history is provided by the patient.  Emesis    patient here with abdominal pain and vomiting she began this morning. Patient notes that she consumed copious amounts of beer and liquor last night. She also admitted to using marijuana and smoking crack cocaine. Location of pain is epigastric described as burning and sharp with radiation to her back. Her emesis has been nonbilious. No change in her stool. No fever reported. No medications used prior to arrival  Past Medical History  Diagnosis Date  . No pertinent past medical history     Past Surgical History  Procedure Date  . Leep     Family History  Problem Relation Age of Onset  . Hypertension Mother     History  Substance Use Topics  . Smoking status: Current Everyday Smoker -- 0.5 packs/day  . Smokeless tobacco: Not on file  . Alcohol Use: 7.0 oz/week    14 drink(s) per week    OB History    Grav Para Term Preterm Abortions TAB SAB Ect Mult Living   2 2 0 0 0 0 0 0 0 2       Review of Systems  Gastrointestinal: Positive for vomiting.  All other systems reviewed and are negative.    Allergies  Review of patient's allergies indicates no known allergies.  Home Medications  No current outpatient prescriptions on file.  BP 123/50  Pulse 54  Temp(Src) 97.7 F (36.5 C) (Axillary)  Resp 20  SpO2 100%  Physical Exam  Nursing note and vitals reviewed. Constitutional: She is oriented to person, place, and time. She appears well-developed and well-nourished.  Non-toxic appearance. No distress.  HENT:  Head: Normocephalic and atraumatic.  Eyes: Conjunctivae, EOM and lids  are normal. Pupils are equal, round, and reactive to light.  Neck: Normal range of motion. Neck supple. No tracheal deviation present. No mass present.  Cardiovascular: Normal rate, regular rhythm and normal heart sounds.  Exam reveals no gallop.   No murmur heard. Pulmonary/Chest: Effort normal and breath sounds normal. No stridor. No respiratory distress. She has no decreased breath sounds. She has no wheezes. She has no rhonchi. She has no rales.  Abdominal: Soft. Normal appearance and bowel sounds are normal. She exhibits no distension. There is generalized tenderness. There is no rigidity, no rebound, no guarding and no CVA tenderness.  Musculoskeletal: Normal range of motion. She exhibits no edema and no tenderness.  Neurological: She is alert and oriented to person, place, and time. She has normal strength. No cranial nerve deficit or sensory deficit. GCS eye subscore is 4. GCS verbal subscore is 5. GCS motor subscore is 6.  Skin: Skin is warm and dry. No abrasion and no rash noted.  Psychiatric: Her speech is normal. Her mood appears anxious. She is slowed.    ED Course  Procedures (including critical care time)   Labs Reviewed  CBC  DIFFERENTIAL  COMPREHENSIVE METABOLIC PANEL  LIPASE, BLOOD  URINALYSIS, ROUTINE W REFLEX MICROSCOPIC   No results found.   No diagnosis found.    MDM  Patient given IV fluids, antibiotics, and medications for treatment of her alcoholic gastritis. Repeat abdominal exam  prior to discharge stable. She will be discharged       Toy Baker, MD 06/13/11 2157

## 2011-06-14 ENCOUNTER — Emergency Department (HOSPITAL_COMMUNITY)
Admission: EM | Admit: 2011-06-14 | Discharge: 2011-06-14 | Disposition: A | Discharge status: Home / Self Care | Payer: Self-pay | Attending: Emergency Medicine | Admitting: Emergency Medicine

## 2011-06-14 ENCOUNTER — Emergency Department (HOSPITAL_COMMUNITY): Payer: Self-pay

## 2011-06-14 ENCOUNTER — Encounter (HOSPITAL_COMMUNITY): Payer: Self-pay | Admitting: Nurse Practitioner

## 2011-06-14 DIAGNOSIS — R109 Unspecified abdominal pain: Secondary | ICD-10-CM | POA: Insufficient documentation

## 2011-06-14 DIAGNOSIS — R079 Chest pain, unspecified: Secondary | ICD-10-CM | POA: Insufficient documentation

## 2011-06-14 DIAGNOSIS — K2970 Gastritis, unspecified, without bleeding: Secondary | ICD-10-CM | POA: Insufficient documentation

## 2011-06-14 DIAGNOSIS — K2990 Gastroduodenitis, unspecified, without bleeding: Secondary | ICD-10-CM | POA: Insufficient documentation

## 2011-06-14 DIAGNOSIS — F17200 Nicotine dependence, unspecified, uncomplicated: Secondary | ICD-10-CM | POA: Insufficient documentation

## 2011-06-14 LAB — DIFFERENTIAL
Basophils Relative: 0 % (ref 0–1)
Eosinophils Absolute: 0 10*3/uL (ref 0.0–0.7)
Eosinophils Relative: 0 % (ref 0–5)
Lymphs Abs: 3.7 10*3/uL (ref 0.7–4.0)
Monocytes Relative: 9 % (ref 3–12)
Neutrophils Relative %: 53 % (ref 43–77)

## 2011-06-14 LAB — COMPREHENSIVE METABOLIC PANEL
CO2: 23 mEq/L (ref 19–32)
Calcium: 9.5 mg/dL (ref 8.4–10.5)
Creatinine, Ser: 0.74 mg/dL (ref 0.50–1.10)
GFR calc Af Amer: >90 mL/min (ref >90)
GFR calc non Af Amer: >90 mL/min (ref >90)
Glucose, Bld: 100 mg/dL — ABNORMAL HIGH (ref 70–99)
Total Protein: 7.6 g/dL (ref 6.0–8.3)

## 2011-06-14 LAB — CBC
MCH: 32.5 pg (ref 26.0–34.0)
MCHC: 34.2 g/dL (ref 30.0–36.0)
MCV: 95 fL (ref 78.0–100.0)
Platelets: 211 10*3/uL (ref 150–400)
RBC: 4.24 MIL/uL (ref 3.87–5.11)

## 2011-06-14 LAB — LIPASE, BLOOD: Lipase: 28 U/L (ref 11–59)

## 2011-06-14 MED ORDER — PANTOPRAZOLE SODIUM 40 MG IV SOLR
40.0000 mg | Freq: Once | INTRAVENOUS | Status: AC
  Administered 2011-06-14: 40 mg via INTRAVENOUS
  Filled 2011-06-14: qty 40

## 2011-06-14 MED ORDER — HYDROCODONE-ACETAMINOPHEN 5-325 MG PO TABS: 1.0000 | ORAL_TABLET | Freq: Four times a day (QID) | ORAL | Status: AC | PRN

## 2011-06-14 MED ORDER — HYDROMORPHONE HCL PF 1 MG/ML IJ SOLN
1.0000 mg | Freq: Once | INTRAMUSCULAR | Status: AC
  Administered 2011-06-14: 1 mg via INTRAVENOUS
  Filled 2011-06-14: qty 1

## 2011-06-14 MED ORDER — SODIUM CHLORIDE 0.9 % IV BOLUS (SEPSIS): 250.0000 mL | Freq: Once | INTRAVENOUS | Status: DC

## 2011-06-14 MED ORDER — SODIUM CHLORIDE 0.9 % IV SOLN: INTRAVENOUS | Status: DC

## 2011-06-14 MED ORDER — ONDANSETRON HCL 4 MG/2ML IJ SOLN
4.0000 mg | Freq: Once | INTRAMUSCULAR | Status: AC
  Administered 2011-06-14: 4 mg via INTRAVENOUS
  Filled 2011-06-14: qty 2

## 2011-06-14 MED ORDER — PROMETHAZINE HCL 25 MG PO TABS: 25.0000 mg | ORAL_TABLET | Freq: Four times a day (QID) | ORAL | Status: DC | PRN

## 2011-06-14 MED ORDER — FAMOTIDINE 20 MG PO TABS: 20.0000 mg | ORAL_TABLET | Freq: Two times a day (BID) | ORAL | Status: DC

## 2011-06-14 MED ORDER — SODIUM CHLORIDE 0.9 % IV BOLUS (SEPSIS)
1000.0000 mL | Freq: Once | INTRAVENOUS | Status: AC
  Administered 2011-06-14: 1000 mL via INTRAVENOUS

## 2011-06-14 NOTE — ED Notes (Signed)
Pt states n/v/abd pain since yesterday. Went to [facility] for same and was given rx for pain and nausea, "but i cant afford to get them filled." states she has had nothing to eat or drink for 24 hours.

## 2011-06-14 NOTE — ED Provider Notes (Signed)
History     CSN: [id number]  Arrival date & time 06/14/11  1738   First MD Initiated Contact with Patient 06/14/11 1900      Chief Complaint  Patient presents with  . Abdominal Pain    (Consider location/radiation/quality/duration/timing/severity/associated sxs/prior treatment) Patient is a 26 y.o. female presenting with abdominal pain. The history is provided by the patient.  Abdominal Pain The primary symptoms of the illness include abdominal pain, fatigue, nausea and vomiting. The primary symptoms of the illness do not include fever, shortness of breath, diarrhea, hematemesis, hematochezia or dysuria. The current episode started yesterday. The onset of the illness was sudden. The problem has not changed since onset. The abdominal pain began yesterday. The abdominal pain has been gradually worsening since its onset. The abdominal pain is generalized. The abdominal pain does not radiate. The severity of the abdominal pain is 10/10. The abdominal pain is relieved by nothing. The abdominal pain is exacerbated by vomiting.  The illness is associated with alcohol use. Symptoms associated with the illness do not include back pain.    Past Medical History  Diagnosis Date  . No pertinent past medical history     Past Surgical History  Procedure Date  . Leep     Family History  Problem Relation Age of Onset  . Hypertension Mother     History  Substance Use Topics  . Smoking status: Current Everyday Smoker -- 0.5 packs/day  . Smokeless tobacco: Not on file  . Alcohol Use: 7.0 oz/week    14 drink(s) per week    OB History    Grav Para Term Preterm Abortions TAB SAB Ect Mult Living   2 2 0 0 0 0 0 0 0 2       Review of Systems  Constitutional: Positive for fatigue. Negative for fever.  HENT: Negative for congestion and neck pain.   Eyes: Negative for redness and visual disturbance.  Respiratory: Negative for shortness of breath.   Cardiovascular: Positive for chest pain.    Gastrointestinal: Positive for nausea, vomiting and abdominal pain. Negative for diarrhea, hematochezia and hematemesis.  Genitourinary: Negative for dysuria.  Musculoskeletal: Negative for back pain.  Skin: Negative for rash.  Neurological: Negative for syncope and headaches.  Hematological: Does not bruise/bleed easily.    Allergies  Review of patient's allergies indicates no known allergies.  Home Medications   Current Outpatient Rx  Name Route Sig Dispense Refill  . BISMUTH SUBSALICYLATE 262 MG/15ML PO SUSP Oral Take 30 mLs by mouth every 6 (six) hours as needed. For nausea    . DIPHENHYDRAMINE-APAP (SLEEP) 25-500 MG PO TABS Oral Take 1 tablet by mouth at bedtime as needed. For sleep    . FAMOTIDINE 20 MG PO TABS Oral Take 1 tablet (20 mg total) by mouth 2 (two) times daily. 30 tablet 0  . HYDROCODONE-ACETAMINOPHEN 5-325 MG PO TABS Oral Take 1-2 tablets by mouth every 6 (six) hours as needed for pain. 12 tablet 0  . PROMETHAZINE HCL 25 MG PO TABS Oral Take 1 tablet (25 mg total) by mouth every 6 (six) hours as needed for nausea. 12 tablet 0    BP 104/57  Pulse 54  Temp(Src) 98.9 F (37.2 C) (Oral)  Resp 14  Ht 5\' 7"  (1.702 m)  Wt 155 lb (70.308 kg)  BMI 24.28 kg/m2  SpO2 97%  Physical Exam  Nursing note and vitals reviewed. Constitutional: She is oriented to person, place, and time. She appears well-developed and well-nourished.  HENT:  Head: Normocephalic and atraumatic.       Mucous membranes dry.  Eyes: Conjunctivae and EOM are normal. Pupils are equal, round, and reactive to light.  Neck: Normal range of motion. Neck supple.  Cardiovascular: Normal rate, regular rhythm and normal heart sounds.   No murmur heard. Pulmonary/Chest: Effort normal and breath sounds normal. No respiratory distress.  Abdominal: Soft. Bowel sounds are normal. There is no tenderness. There is no guarding.  Musculoskeletal: Normal range of motion. She exhibits no edema and no tenderness.   Neurological: She is alert and oriented to person, place, and time. No cranial nerve deficit. She exhibits normal muscle tone. Coordination normal.  Skin: Skin is warm. No rash noted. She is not diaphoretic. No erythema.    ED Course  Procedures (including critical care time)  Labs Reviewed  COMPREHENSIVE METABOLIC PANEL - Abnormal; Notable for the following:    Potassium 3.1 (*)    Glucose, Bld 100 (*)    All other components within normal limits  CBC  DIFFERENTIAL  LIPASE, BLOOD   Dg Abd Acute W/chest  06/14/2011  *RADIOLOGY REPORT*  Clinical Data: Abdominal pain, nausea and vomiting.  ACUTE ABDOMEN SERIES (ABDOMEN 2 VIEW & CHEST 1 VIEW)  Comparison: None  Findings: The upright chest x-ray is normal.  Two views of the abdomen demonstrate a normal bowel gas pattern. No findings for obstruction and/or perforation.  No worrisome calcifications.  The soft tissue shadows are maintained.  No significant bony findings.  IMPRESSION:  1.  No acute cardiopulmonary findings. 2.  No plain film findings for an acute abdominal process.  Original Report Authenticated By: P. Loralie Champagne, M.D.   Results for orders placed during the hospital encounter of 06/14/11  CBC      Component Value Range   WBC 9.7  4.0 - 10.5 (K/uL)   RBC 4.24  3.87 - 5.11 (MIL/uL)   Hemoglobin 13.8  12.0 - 15.0 (g/dL)   HCT 16.1  09.6 - 04.5 (%)   MCV 95.0  78.0 - 100.0 (fL)   MCH 32.5  26.0 - 34.0 (pg)   MCHC 34.2  30.0 - 36.0 (g/dL)   RDW 40.9  81.1 - 91.4 (%)   Platelets 211  150 - 400 (K/uL)  DIFFERENTIAL      Component Value Range   Neutrophils Relative 53  43 - 77 (%)   Neutro Abs 5.1  1.7 - 7.7 (K/uL)   Lymphocytes Relative 38  12 - 46 (%)   Lymphs Abs 3.7  0.7 - 4.0 (K/uL)   Monocytes Relative 9  3 - 12 (%)   Monocytes Absolute 0.8  0.1 - 1.0 (K/uL)   Eosinophils Relative 0  0 - 5 (%)   Eosinophils Absolute 0.0  0.0 - 0.7 (K/uL)   Basophils Relative 0  0 - 1 (%)   Basophils Absolute 0.0  0.0 - 0.1 (K/uL)   LIPASE, BLOOD      Component Value Range   Lipase 28  11 - 59 (U/L)  COMPREHENSIVE METABOLIC PANEL      Component Value Range   Sodium 142  135 - 145 (mEq/L)   Potassium 3.1 (*) 3.5 - 5.1 (mEq/L)   Chloride 106  96 - 112 (mEq/L)   CO2 23  19 - 32 (mEq/L)   Glucose, Bld 100 (*) 70 - 99 (mg/dL)   BUN 9  6 - 23 (mg/dL)   Creatinine, Ser 7.82  0.50 - 1.10 (mg/dL)   Calcium 9.5  8.4 - 10.5 (mg/dL)   Total Protein 7.6  6.0 - 8.3 (g/dL)   Albumin 4.4  3.5 - 5.2 (g/dL)   AST 30  0 - 37 (U/L)   ALT 15  0 - 35 (U/L)   Alkaline Phosphatase 68  39 - 117 (U/L)   Total Bilirubin 0.8  0.3 - 1.2 (mg/dL)   GFR calc non Af Amer >90  >90 (mL/min)   GFR calc Af Amer >90  >90 (mL/min)     1. Gastritis   2. Abdominal pain       MDM   The patient seen at [facility] long yesterday diagnosis of a call gastritis was hydrated antinausea medicine provided labs done. Patient discharged home with the Carafate and Phenergan. But did not get them filled states he Carafate was too expensive. Patient's here again today with persistent vomiting abdominal pain nausea. Workup here to day patient had IV fluids lab workup without any significant changes compared to yesterday abdominal films flat plate and upright without any signs of obstruction patient felt sniffily better with pain medication and Zofran. Patient hydrated in the ED we sent home with Pepcid Phenergan and hydrocodone. Resource guide provided to help find a primary care Dr. Abdomen was soft nontender no acute surgical process anticipated.        Shelda Jakes, MD 06/14/11 2233

## 2011-06-14 NOTE — ED Notes (Signed)
Patient here for nausea and vomiting.  Patient states she was seen at [facility] for same yesterday after consumption of alcohol, THC and smoking crack.  Patient is vomiting, clear mucous at this time.  Patient was vomiting on floor, after being given multiple emesis bags and buckets.  Patient states she did not have time to fill her prescriptions that she was given at [facility].

## 2011-06-14 NOTE — ED Notes (Addendum)
Pt states pain began yesterday morning and she has not kept anything down. Pt is actively heaving and vomiting. Denies diarrhea. Cannot remember if she ate anything unusual. Was seen at [facility] yesterday for same problem. Did not fill Rx given when d/c'd.

## 2011-06-14 NOTE — Discharge Instructions (Signed)
Gastritis Gastritis is an irritation of the stomach. It can be caused by anything that bothers the stomach. Some irritants include:  Alcohol.   Caffeine.   Nicotine.   Spicy, acidic, greasy, and fried foods.   Medicines for pain and arthritis.   Emotional distress.  HOME CARE   Only take medicine as told by your doctor.   Take small sips of clear liquids often. Do not drink large amounts of liquids at one time.   If you have watery poop (diarrhea), avoid milk, fruit, tobacco, alcohol, really hot or cold liquids, or too much of anything at one time.   Rest.   Wash your hands often.   Once you can keep clear liquids down, start soups, juices, apple sauce, crackers, and sherbet. Slowly add plain, not spicy, foods to your diet.  GET HELP RIGHT AWAY IF:   You cannot keep fluids down.   You cannot stop throwing up (vomiting) or you throw up blood.   You have more stomach or chest pain.   You have a temperature by mouth above 102 F (38.9 C), not controlled by medicine.   You pass out (faint), feel lightheaded, or are more thirsty than normal.   You have bloody or black poop (stools).   Your watery poop will not go away.   You are not improving or are getting worse.  MAKE SURE YOU:   Understand these instructions.   Will watch your condition.   Will get help right away if you are not doing well or get worse.  Document Released: 06/17/2007 Document Revised: 12/18/2010 Document Reviewed: 11/16/2008 [facility] Patient Information 2012 [place], [place].  Take medications as directed. Take the Pepcid to calm her stomach. Take pain medicine as needed take Phenergan for nausea and vomiting. All these meds are recently priced may want to try to get him at [employer]. Resource guide provided below to help you find a primary care Dr. Return for new worse symptoms.   RESOURCE GUIDE  Chronic Pain Problems: Contact [facility]  [phone] Patients need to be  referred by their primary care doctor.  Insufficient Money for Medicine: Contact United Way:  call "211" or [facility] [phone].  No Primary Care Doctor: - Call [facility]  [phone] - can help you locate a primary care doctor that  accepts your insurance, provides certain services, etc. - Physician Referral [phone]  Agencies that provide inexpensive medical care: - [facility]  [phone] - [facility]  [phone] - [facility]  [phone] [facility]  [phone] - [facility]  [phone] [facility]  [phone]  Medicaid-accepting [facility] Services Providers: - [facility]- [address]  [phone], Mon-Fri 9am-7pm, Sat 9am-1pm - [facility]- [address] [place], Suite [place]  [phone] - [facility]- [address], [address] [place]  [phone] [facility]- [address]  [phone] - Renaye Rakers- [address] [place], [address], [phone]  Only accepts [place] patients after they have their name  applied to their card  Self Pay (no insurance) in [place]: - Sickle Cell Patients: Dr Willey Blade, [facility]  [address] [place], [phone] - [facility]- [address] [place]  [phone]       [facility] [place]- [address]       -     [employer]- see information above (Speak to [employer] if you do not have insurance)       -  [facility]- [address] [place], [phone]       -  [facility]- [address],  [phone]       -  [facility]- [address], [phone]       -  Dr Julio Sicks-  [address], [place], [phone]       -  [facility]- [address], [phone]       -  [facility]- [address], [phone], also [address] [phone]       -    [facility] [place]- [address] [place], [phone], 1st & 3rd Saturday   every month, 10am-1pm  1) Find a Doctor and Pay Out of Pocket Although you won't have to find out who is covered by your insurance plan, it is a good idea to ask around and get recommendations. You will then need to call the office and see if the doctor you have chosen will accept you as a new patient and what types of options they offer for patients who are self-pay. Some doctors offer discounts or will set up payment plans for their patients who do not have insurance, but you will need to ask so you aren't surprised when you get to your appointment.  2) Contact Your Local Health Department Not all health departments have doctors that can see patients for sick visits, but many do, so it is worth a call to see if yours does. If you don't know where your local health department is, you can check in your phone book. The CDC also has a tool to help you locate your state's health department, and many state websites also have listings of all of their local health departments.  3) Find a Walk-in Clinic If your illness is not likely to be very severe or complicated, you may want to try a walk in clinic. These are popping up all over the country in pharmacies, drugstores, and shopping centers. They're usually staffed by nurse practitioners or physician assistants that have been trained to treat common illnesses and complaints. They're usually fairly quick and inexpensive. However, if you have serious medical issues or chronic medical problems, these are probably not your best option  STD Testing - [facility] Department of [facility] [place], STD Clinic, [address] [place], phone [phone] or [phone].  Monday - Friday, call for an appointment. [facility] Department of [employer], STD Clinic, [address], [place], phone [phone] or [phone].   Monday - Friday, call for an appointment.  Abuse/Neglect: [facility] Child Abuse Hotline 317-733-5999 [facility] Child Abuse Hotline [phone] (After Hours)  Emergency Shelter:  [facility] 270-821-2368  Maternity Homes: - [facility] at [facility] [place] [facility] [phone] - [facility] [phone]  MRSA Hotline #:   435-216-9665  [facility] Resources  [facility]  [employer] [facility] Dept. [address]                 [address]         [address] [place] [address]  [address]  Cristobal Goldmann Phone:  [phone]                                  Phone:  [phone]                   Phone:  [phone]  [facility], [phone] - Mid - [facility] - [phone]       -     [facility] in [place], [address],                                  [phone], [facility] Child Abuse Hotline 289-785-7810 or 802-753-6564 (After Hours)   Behavioral Health Services  Substance Abuse Resources: - [facility] and Drug Services  [phone] - [facility] [phone] - [facility] [place] [phone] Floydene Flock [phone] - Residential & Outpatient Substance Abuse Program  [phone]  Psychological Services: [facility]  [phone] [facility] Services  [phone] - [facility], [phone] [place]. [address], [place], ACCESS LINE: [phone] or [phone], [email or web address]  Dental Assistance  If unable to pay or uninsured, contact:  [facility] or [facility]. to become qualified for the adult dental clinic.  Patients with Medicaid: [facility] [phone] [address], [phone] [address]  [phone]  If unable to pay, or uninsured, contact [facility] [phone]) or [facility] Department [phone] in [place], [phone] in [facility]) to become qualified for the adult dental clinic  Other Low-Cost Community Dental Services: - [facility]- [address] [place], [place], [place], [address], [phone], [phone], 2nd and 4th Thursday of the month at 6:30am.  10 clients each day by appointment, can sometimes see walk-in patients if someone does not show for an appointment. [facility] [place]- [address] Ether Griffins [place], [place], [address], [phone] - [facility]- [address], [place], [place], [address], [phone] - [place] [facility]- [phone] [facility] Department- [phone] [facility] Department- [phone]

## 2011-07-16 ENCOUNTER — Observation Stay (HOSPITAL_COMMUNITY)
Admission: EM | Admit: 2011-07-16 | Discharge: 2011-07-17 | Disposition: A | Discharge status: Home / Self Care | Payer: Self-pay | Attending: Emergency Medicine | Admitting: Emergency Medicine

## 2011-07-16 ENCOUNTER — Encounter (HOSPITAL_COMMUNITY): Payer: Self-pay | Admitting: *Deleted

## 2011-07-16 DIAGNOSIS — K2970 Gastritis, unspecified, without bleeding: Secondary | ICD-10-CM

## 2011-07-16 DIAGNOSIS — K2920 Alcoholic gastritis without bleeding: Principal | ICD-10-CM | POA: Insufficient documentation

## 2011-07-16 LAB — COMPREHENSIVE METABOLIC PANEL
Albumin: 4.6 g/dL (ref 3.5–5.2)
Alkaline Phosphatase: 72 U/L (ref 39–117)
BUN: 7 mg/dL (ref 6–23)
CO2: 23 mEq/L (ref 19–32)
Chloride: 107 mEq/L (ref 96–112)
GFR calc non Af Amer: >90 mL/min (ref >90)
Glucose, Bld: 127 mg/dL — ABNORMAL HIGH (ref 70–99)
Potassium: 3.5 mEq/L (ref 3.5–5.1)
Total Bilirubin: 0.5 mg/dL (ref 0.3–1.2)

## 2011-07-16 LAB — CBC WITH DIFFERENTIAL/PLATELET
Basophils Relative: 0 % (ref 0–1)
HCT: 39.6 % (ref 36.0–46.0)
Hemoglobin: 13.6 g/dL (ref 12.0–15.0)
Lymphocytes Relative: 20 % (ref 12–46)
Lymphs Abs: 2 10*3/uL (ref 0.7–4.0)
Monocytes Relative: 3 % (ref 3–12)
Neutro Abs: 7.9 10*3/uL — ABNORMAL HIGH (ref 1.7–7.7)
Neutrophils Relative %: 77 % (ref 43–77)
RBC: 4.18 MIL/uL (ref 3.87–5.11)

## 2011-07-16 LAB — URINALYSIS, ROUTINE W REFLEX MICROSCOPIC
Bilirubin Urine: NEGATIVE
Ketones, ur: 15 mg/dL — AB
Nitrite: NEGATIVE
pH: 7.5 (ref 5.0–8.0)

## 2011-07-16 LAB — URINE MICROSCOPIC-ADD ON

## 2011-07-16 LAB — LIPASE, BLOOD: Lipase: 12 U/L (ref 11–59)

## 2011-07-16 MED ORDER — LANSOPRAZOLE 30 MG PO CPDR: 30.0000 mg | DELAYED_RELEASE_CAPSULE | Freq: Every day | ORAL | Status: DC

## 2011-07-16 MED ORDER — DICYCLOMINE HCL 10 MG/ML IM SOLN
20.0000 mg | Freq: Once | INTRAMUSCULAR | Status: AC
  Administered 2011-07-16: 20 mg via INTRAMUSCULAR
  Filled 2011-07-16: qty 2

## 2011-07-16 MED ORDER — SODIUM CHLORIDE 0.9 % IV BOLUS (SEPSIS)
1000.0000 mL | Freq: Once | INTRAVENOUS | Status: AC
  Administered 2011-07-16: 1000 mL via INTRAVENOUS

## 2011-07-16 MED ORDER — TRAMADOL HCL 50 MG PO TABS: 50.0000 mg | ORAL_TABLET | Freq: Four times a day (QID) | ORAL | Status: AC | PRN

## 2011-07-16 MED ORDER — ONDANSETRON HCL 4 MG/2ML IJ SOLN
4.0000 mg | Freq: Once | INTRAMUSCULAR | Status: AC
  Administered 2011-07-16: 4 mg via INTRAVENOUS

## 2011-07-16 MED ORDER — GI COCKTAIL ~~LOC~~
30.0000 mL | Freq: Once | ORAL | Status: AC
  Administered 2011-07-16: 30 mL via ORAL
  Filled 2011-07-16: qty 30

## 2011-07-16 MED ORDER — ONDANSETRON HCL 4 MG/2ML IJ SOLN
INTRAMUSCULAR | Status: AC
Start: 2011-07-16 — End: 2011-07-17
  Filled 2011-07-16: qty 2

## 2011-07-16 MED ORDER — PROMETHAZINE HCL 25 MG/ML IJ SOLN
25.0000 mg | INTRAMUSCULAR | Status: AC
  Administered 2011-07-16: 25 mg via INTRAVENOUS
  Filled 2011-07-16: qty 1

## 2011-07-16 MED ORDER — ONDANSETRON HCL 4 MG/2ML IJ SOLN
INTRAMUSCULAR | Status: AC
  Filled 2011-07-16: qty 2

## 2011-07-16 MED ORDER — PANTOPRAZOLE SODIUM 40 MG IV SOLR
40.0000 mg | Freq: Once | INTRAVENOUS | Status: AC
  Administered 2011-07-16: 40 mg via INTRAVENOUS
  Filled 2011-07-16: qty 40

## 2011-07-16 MED ORDER — PROMETHAZINE HCL 25 MG PO TABS: 25.0000 mg | ORAL_TABLET | Freq: Four times a day (QID) | ORAL | Status: DC | PRN

## 2011-07-16 NOTE — ED Provider Notes (Addendum)
History     CSN: [id number]  Arrival date & time 07/16/11  6962   First MD Initiated Contact with Patient 07/16/11 1905      Chief Complaint  Patient presents with  . Abdominal Pain    (Consider location/radiation/quality/duration/timing/severity/associated sxs/prior treatment) HPI Pt has been seen before for similar symptoms and diagnosed with alcoholic gastritis. She states that abdominal pain and vomiting are similar to past presentations. This episodes started this AM. Pain is in the upper abd and does not radiate. States she drank several alcoholic drinks last night. She has had multiple episodes of vomiting. No blood or coffee ground appearance. Normal BM today. No tarry stool. No fever, chills, urinary or vaginal complaints Past Medical History  Diagnosis Date  . No pertinent past medical history     Past Surgical History  Procedure Date  . Leep     Family History  Problem Relation Age of Onset  . Hypertension Mother     History  Substance Use Topics  . Smoking status: Current Everyday Smoker -- 0.5 packs/day  . Smokeless tobacco: Not on file  . Alcohol Use: 7.0 oz/week    14 drink(s) per week    OB History    Grav Para Term Preterm Abortions TAB SAB Ect Mult Living   2 2 0 0 0 0 0 0 0 2       Review of Systems  Constitutional: Positive for diaphoresis. Negative for fever and chills.  Respiratory: Negative for shortness of breath.   Cardiovascular: Negative for chest pain.  Gastrointestinal: Positive for nausea, vomiting and abdominal pain. Negative for diarrhea, constipation and blood in stool.  Genitourinary: Negative for dysuria, vaginal bleeding and vaginal discharge.  Skin: Negative for pallor and rash.  Neurological: Negative for weakness and numbness.    Allergies  Review of patient's allergies indicates no known allergies.  Home Medications   Current Outpatient Rx  Name Route Sig Dispense Refill  . LANSOPRAZOLE 30 MG PO CPDR Oral Take 1  capsule (30 mg total) by mouth daily. 30 capsule 0  . PROMETHAZINE HCL 25 MG PO TABS Oral Take 1 tablet (25 mg total) by mouth every 6 (six) hours as needed for nausea. 30 tablet 0  . TRAMADOL HCL 50 MG PO TABS Oral Take 1 tablet (50 mg total) by mouth every 6 (six) hours as needed for pain. 15 tablet 0    BP 113/69  Pulse 69  Temp 97.8 F (36.6 C) (Oral)  Resp 16  SpO2 100%  Physical Exam  Nursing note and vitals reviewed. Constitutional: She is oriented to person, place, and time. She appears well-developed and well-nourished.       Vomiting in triage. Vomit is clear.   HENT:  Head: Normocephalic and atraumatic.  Mouth/Throat: Oropharynx is clear and moist.  Eyes: EOM are normal. Pupils are equal, round, and reactive to light.  Neck: Normal range of motion. Neck supple.  Cardiovascular: Normal rate and regular rhythm.   Pulmonary/Chest: Effort normal and breath sounds normal. No respiratory distress. She has no wheezes. She has no rales.  Abdominal: Soft. Bowel sounds are normal. There is tenderness (Diffuse bl upper quadrant TTP). There is no rebound and no guarding.  Musculoskeletal: Normal range of motion. She exhibits no edema and no tenderness.  Neurological: She is alert and oriented to person, place, and time.  Skin: Skin is warm. No rash noted. She is diaphoretic. No erythema.  Psychiatric: She has a normal mood and affect. Her behavior  is normal.    ED Course  Procedures (including critical care time)  Labs Reviewed  CBC WITH DIFFERENTIAL - Abnormal; Notable for the following:    Neutro Abs 7.9 (*)     All other components within normal limits  COMPREHENSIVE METABOLIC PANEL - Abnormal; Notable for the following:    Glucose, Bld 127 (*)     All other components within normal limits  URINALYSIS, ROUTINE W REFLEX MICROSCOPIC - Abnormal; Notable for the following:    Color, Urine AMBER (*)  BIOCHEMICALS MAY BE AFFECTED BY COLOR   APPearance CLOUDY (*)     Ketones, ur  15 (*)     Protein, ur 30 (*)     Leukocytes, UA SMALL (*)     All other components within normal limits  URINE MICROSCOPIC-ADD ON - Abnormal; Notable for the following:    Squamous Epithelial / LPF MANY (*)     Bacteria, UA FEW (*)     All other components within normal limits  LIPASE, BLOOD  PREGNANCY, URINE   No results found.   1. Gastritis       MDM   Pt resting pain improved, tolerating PO's. Advised to avoid alcohol.        Loren Racer, MD 07/16/11 2251   At time of discharge, I was asked to assess patient for return of epigastric pain with nausea and vomiting. Patient states her pain is returned and is severe and sharp in quality and not radiating. She is actively vomiting dark emesis without blood. She denies any blood in stools or black or cardiac stools. She has some mild epigastric tenderness but no right upper quadrant tenderness and no rebound or guarding. Heart regular rate and rhythm with lungs clear bilaterally. Moves all extremity x4 without peripheral edema. No rashes. Skin is warm and dry. Patient still has IV in place IV Phenergan ordered. Decision made to place patient on CT observation protocol for hyperemesis. IV fluids initiated. Plan serial rechecks and continue antiemetics as needed. Chest x-ray ordered.  Results for orders placed during the hospital encounter of 07/16/11  CBC WITH DIFFERENTIAL      Component Value Range   WBC 10.2  4.0 - 10.5 K/uL   RBC 4.18  3.87 - 5.11 MIL/uL   Hemoglobin 13.6  12.0 - 15.0 g/dL   HCT 46.9  62.9 - 52.8 %   MCV 94.7  78.0 - 100.0 fL   MCH 32.5  26.0 - 34.0 pg   MCHC 34.3  30.0 - 36.0 g/dL   RDW 41.3  24.4 - 01.0 %   Platelets 216  150 - 400 K/uL   Neutrophils Relative 77  43 - 77 %   Neutro Abs 7.9 (*) 1.7 - 7.7 K/uL   Lymphocytes Relative 20  12 - 46 %   Lymphs Abs 2.0  0.7 - 4.0 K/uL   Monocytes Relative 3  3 - 12 %   Monocytes Absolute 0.3  0.1 - 1.0 K/uL   Eosinophils Relative 0  0 - 5 %    Eosinophils Absolute 0.0  0.0 - 0.7 K/uL   Basophils Relative 0  0 - 1 %   Basophils Absolute 0.0  0.0 - 0.1 K/uL  COMPREHENSIVE METABOLIC PANEL      Component Value Range   Sodium 144  135 - 145 mEq/L   Potassium 3.5  3.5 - 5.1 mEq/L   Chloride 107  96 - 112 mEq/L   CO2 23  19 -  32 mEq/L   Glucose, Bld 127 (*) 70 - 99 mg/dL   BUN 7  6 - 23 mg/dL   Creatinine, Ser 2.95  0.50 - 1.10 mg/dL   Calcium 9.7  8.4 - 62.1 mg/dL   Total Protein 7.9  6.0 - 8.3 g/dL   Albumin 4.6  3.5 - 5.2 g/dL   AST 20  0 - 37 U/L   ALT 7  0 - 35 U/L   Alkaline Phosphatase 72  39 - 117 U/L   Total Bilirubin 0.5  0.3 - 1.2 mg/dL   GFR calc non Af Amer >90  >90 mL/min   GFR calc Af Amer >90  >90 mL/min  LIPASE, BLOOD      Component Value Range   Lipase 12  11 - 59 U/L  URINALYSIS, ROUTINE W REFLEX MICROSCOPIC      Component Value Range   Color, Urine AMBER (*) YELLOW   APPearance CLOUDY (*) CLEAR   Specific Gravity, Urine 1.029  1.005 - 1.030   pH 7.5  5.0 - 8.0   Glucose, UA NEGATIVE  NEGATIVE mg/dL   Hgb urine dipstick NEGATIVE  NEGATIVE   Bilirubin Urine NEGATIVE  NEGATIVE   Ketones, ur 15 (*) NEGATIVE mg/dL   Protein, ur 30 (*) NEGATIVE mg/dL   Urobilinogen, UA 0.2  0.0 - 1.0 mg/dL   Nitrite NEGATIVE  NEGATIVE   Leukocytes, UA SMALL (*) NEGATIVE  PREGNANCY, URINE      Component Value Range   Preg Test, Ur NEGATIVE  NEGATIVE  URINE MICROSCOPIC-ADD ON      Component Value Range   Squamous Epithelial / LPF MANY (*) RARE   WBC, UA 7-10  <3 WBC/hpf   RBC / HPF 0-2  <3 RBC/hpf   Bacteria, UA FEW (*) RARE   Urine-Other MUCOUS PRESENT     Dg Chest Portable 1 View  07/17/2011  *RADIOLOGY REPORT*  Clinical Data: Vomiting.  Abdominal pain.  PORTABLE CHEST - 1 VIEW  Comparison: 04/16/2006  Findings: Heart and mediastinal contours are within normal limits. No focal opacities or effusions.  No acute bony abnormality.  IMPRESSION: No active cardiopulmonary disease.  Original Report Authenticated By: Cyndie Chime, M.D.    Recheck at 7:40 AM is feeling much better drinking ginger ale. Patient feels comfortable for discharge home. Abdomen soft nontender without distention. Heart regular rate and rhythm lungs clear. Discharge instructions by the width prescriptions. Plan outpatient followup as needed.   Sunnie Nielsen, MD 07/17/11 [phone]

## 2011-07-16 NOTE — ED Notes (Signed)
Received report from Alvino Chapel, Charity fundraiser. Patient alert and responsive. After given ordered GI cocktail, patient began with heavy emesis.

## 2011-07-16 NOTE — ED Notes (Signed)
MD at bedside. 

## 2011-07-16 NOTE — ED Notes (Signed)
Patient continues to have episodes of emesis. Now with bile colored emesis.

## 2011-07-16 NOTE — ED Notes (Signed)
abd pain nv no diarrhea since this am.  Actively vomiting in triage

## 2011-07-16 NOTE — ED Notes (Signed)
Patient was noted to continue to have emesis after PO challenge. EDP was made aware. Medication for stomach spasms was ordered.

## 2011-07-17 ENCOUNTER — Observation Stay (HOSPITAL_COMMUNITY): Payer: Self-pay

## 2011-07-17 MED ORDER — PROMETHAZINE HCL 25 MG/ML IJ SOLN
25.0000 mg | Freq: Once | INTRAMUSCULAR | Status: AC
  Administered 2011-07-17: 25 mg via INTRAVENOUS
  Filled 2011-07-17: qty 1

## 2011-07-17 MED ORDER — SODIUM CHLORIDE 0.9 % IV SOLN
1000.0000 mL | INTRAVENOUS | Status: DC
  Administered 2011-07-17: 1000 mL via INTRAVENOUS

## 2011-07-17 MED ORDER — ONDANSETRON HCL 4 MG/2ML IJ SOLN
4.0000 mg | Freq: Four times a day (QID) | INTRAMUSCULAR | Status: DC | PRN
Start: 2011-07-17 — End: 2011-07-17

## 2011-07-17 MED ORDER — HYDROMORPHONE HCL PF 1 MG/ML IJ SOLN
1.0000 mg | Freq: Once | INTRAMUSCULAR | Status: AC
  Administered 2011-07-17: 1 mg via INTRAVENOUS
  Filled 2011-07-17: qty 1

## 2011-07-17 NOTE — ED Notes (Signed)
Pt ready to leave. Reports no nausea or abdominal pain at this time. Pt shown to restroom and given coke before discharge.

## 2011-07-17 NOTE — ED Notes (Signed)
Patient states she feels a little better after receiving IV pain medication.

## 2011-07-17 NOTE — Progress Notes (Signed)
Observation review is complete. 

## 2011-07-17 NOTE — ED Notes (Signed)
Care assumed at this time, nad noted, abc intact.

## 2011-07-17 NOTE — ED Notes (Signed)
Pt states she would like to sleep a little bit longer while she waits for her ride to pick her up, able to tolerate gingerale. Verbalized to pt this would be fine.

## 2011-07-17 NOTE — ED Notes (Signed)
Transferred from B15 for eval of generalized abd pain.  Rates pain 6/10 .  Denies nausea at this time

## 2011-07-17 NOTE — ED Notes (Signed)
Pt report to paula t in cdu

## 2011-07-17 NOTE — ED Notes (Signed)
Patient continues to have nausea and vomiting. EDP was made aware of po challenge X 2.

## 2011-09-07 ENCOUNTER — Encounter (HOSPITAL_COMMUNITY): Payer: Self-pay | Admitting: *Deleted

## 2011-09-07 ENCOUNTER — Inpatient Hospital Stay (HOSPITAL_COMMUNITY)
Admission: AD | Admit: 2011-09-07 | Discharge: 2011-09-07 | Disposition: A | Discharge status: Home / Self Care | Payer: Self-pay | Source: Ambulatory Visit | Attending: Obstetrics & Gynecology | Admitting: Obstetrics & Gynecology

## 2011-09-07 DIAGNOSIS — B9689 Other specified bacterial agents as the cause of diseases classified elsewhere: Secondary | ICD-10-CM

## 2011-09-07 DIAGNOSIS — N760 Acute vaginitis: Secondary | ICD-10-CM

## 2011-09-07 DIAGNOSIS — A499 Bacterial infection, unspecified: Secondary | ICD-10-CM

## 2011-09-07 DIAGNOSIS — R109 Unspecified abdominal pain: Secondary | ICD-10-CM | POA: Insufficient documentation

## 2011-09-07 HISTORY — DX: Reserved for concepts with insufficient information to code with codable children: IMO0002

## 2011-09-07 HISTORY — DX: Unspecified abnormal cytological findings in specimens from cervix uteri: R87.619

## 2011-09-07 LAB — WET PREP, GENITAL

## 2011-09-07 LAB — URINALYSIS, ROUTINE W REFLEX MICROSCOPIC
Bilirubin Urine: NEGATIVE
Glucose, UA: NEGATIVE mg/dL
Ketones, ur: 15 mg/dL — AB
Leukocytes, UA: NEGATIVE
pH: 6 (ref 5.0–8.0)

## 2011-09-07 LAB — CBC
MCV: 97.4 fL (ref 78.0–100.0)
Platelets: 224 10*3/uL (ref 150–400)
RDW: 13.1 % (ref 11.5–15.5)
WBC: 7.8 10*3/uL (ref 4.0–10.5)

## 2011-09-07 MED ORDER — METRONIDAZOLE 500 MG PO TABS: 500.0000 mg | ORAL_TABLET | Freq: Two times a day (BID) | ORAL | Status: AC

## 2011-09-07 MED ORDER — NORGESTIMATE-ETH ESTRADIOL 0.25-35 MG-MCG PO TABS: 1.0000 | ORAL_TABLET | Freq: Every day | ORAL | Status: DC

## 2011-09-07 NOTE — MAU Note (Signed)
Sat night started having abdominal pain.  Started period Aug 4.  Missed depo in July

## 2011-09-07 NOTE — Progress Notes (Signed)
States pain has "eased up a little bit." Also C/O irritation "down there."

## 2011-09-07 NOTE — Progress Notes (Signed)
Not in lobby when called 

## 2011-09-07 NOTE — MAU Provider Note (Signed)
History     CSN: [id number]  Arrival date and time: 09/07/11 1356   None     Chief Complaint  Patient presents with  . Abdominal Pain   HPI  Pt is not pregnant and started having abdominal pain 8/24.  She missed her Depo injection due in July. Pt denies fever, nausea or vomiting.  Pt states her urine is dark and has some discomfort/irritation with urination.  She has some beige vaginal discharge.  She last had IC several weeks ago- she uses a condom sometimes.  She is waiting for her insurance to get another Depo injection  Past Medical History  Diagnosis Date  . No pertinent past medical history     Past Surgical History  Procedure Date  . Leep     Family History  Problem Relation Age of Onset  . Hypertension Mother     History  Substance Use Topics  . Smoking status: Current Everyday Smoker -- 0.5 packs/day  . Smokeless tobacco: Not on file  . Alcohol Use: 7.0 oz/week    14 drink(s) per week    Allergies: No Known Allergies  Prescriptions prior to admission  Medication Sig Dispense Refill  . lansoprazole (PREVACID) 30 MG capsule Take 1 capsule (30 mg total) by mouth daily.  30 capsule  0    Review of Systems  Constitutional: Negative for fever and chills.  Gastrointestinal: Positive for abdominal pain. Negative for nausea, vomiting, diarrhea and constipation.  Genitourinary: Positive for dysuria. Negative for hematuria.  Musculoskeletal: Positive for back pain.   Physical Exam   Blood pressure 123/68, pulse 71, temperature 98.1 F (36.7 C), temperature source Oral, resp. rate 20, weight 73.936 kg (163 lb), last menstrual period 08/16/2011.  Physical Exam  Vitals reviewed. Constitutional: She is oriented to person, place, and time. She appears well-developed and well-nourished.  HENT:  Head: Normocephalic.  Eyes: Pupils are equal, round, and reactive to light.  Neck: Normal range of motion. Neck supple.  Cardiovascular: Normal rate.   Respiratory:  Effort normal.  GI: Soft. Bowel sounds are normal. She exhibits no distension. There is no tenderness. There is no rebound and no guarding.  Genitourinary: Vagina normal.       Small amount of watery beige discharge; cervix clean, nontender; uterus NSSC NT adnexa without palpable enlargement or tenderness  Musculoskeletal: Normal range of motion.  Neurological: She is alert and oriented to person, place, and time.  Skin: Skin is warm and dry.  Psychiatric: She has a normal mood and affect.    MAU Course  Procedures Results for orders placed during the hospital encounter of 09/07/11 (from the past 24 hour(s))  URINALYSIS, ROUTINE W REFLEX MICROSCOPIC     Status: Abnormal   Collection Time   09/07/11  2:25 PM      Component Value Range   Color, Urine YELLOW  YELLOW   APPearance CLEAR  CLEAR   Specific Gravity, Urine >1.030 (*) 1.005 - 1.030   pH 6.0  5.0 - 8.0   Glucose, UA NEGATIVE  NEGATIVE mg/dL   Hgb urine dipstick NEGATIVE  NEGATIVE   Bilirubin Urine NEGATIVE  NEGATIVE   Ketones, ur 15 (*) NEGATIVE mg/dL   Protein, ur NEGATIVE  NEGATIVE mg/dL   Urobilinogen, UA 0.2  0.0 - 1.0 mg/dL   Nitrite NEGATIVE  NEGATIVE   Leukocytes, UA NEGATIVE  NEGATIVE  POCT PREGNANCY, URINE     Status: Normal   Collection Time   09/07/11  2:31 PM  Component Value Range   Preg Test, Ur NEGATIVE  NEGATIVE  CBC     Status: Normal   Collection Time   09/07/11  3:41 PM      Component Value Range   WBC 7.8  4.0 - 10.5 K/uL   RBC 4.24  3.87 - 5.11 MIL/uL   Hemoglobin 13.6  12.0 - 15.0 g/dL   HCT 16.1  09.6 - 04.5 %   MCV 97.4  78.0 - 100.0 fL   MCH 32.1  26.0 - 34.0 pg   MCHC 32.9  30.0 - 36.0 g/dL   RDW 40.9  81.1 - 91.4 %   Platelets 224  150 - 400 K/uL   Results for orders placed during the hospital encounter of 09/07/11 (from the past 24 hour(s))  URINALYSIS, ROUTINE W REFLEX MICROSCOPIC     Status: Abnormal   Collection Time   09/07/11  2:25 PM      Component Value Range   Color, Urine  YELLOW  YELLOW   APPearance CLEAR  CLEAR   Specific Gravity, Urine >1.030 (*) 1.005 - 1.030   pH 6.0  5.0 - 8.0   Glucose, UA NEGATIVE  NEGATIVE mg/dL   Hgb urine dipstick NEGATIVE  NEGATIVE   Bilirubin Urine NEGATIVE  NEGATIVE   Ketones, ur 15 (*) NEGATIVE mg/dL   Protein, ur NEGATIVE  NEGATIVE mg/dL   Urobilinogen, UA 0.2  0.0 - 1.0 mg/dL   Nitrite NEGATIVE  NEGATIVE   Leukocytes, UA NEGATIVE  NEGATIVE  POCT PREGNANCY, URINE     Status: Normal   Collection Time   09/07/11  2:31 PM      Component Value Range   Preg Test, Ur NEGATIVE  NEGATIVE  CBC     Status: Normal   Collection Time   09/07/11  3:41 PM      Component Value Range   WBC 7.8  4.0 - 10.5 K/uL   RBC 4.24  3.87 - 5.11 MIL/uL   Hemoglobin 13.6  12.0 - 15.0 g/dL   HCT 78.2  95.6 - 21.3 %   MCV 97.4  78.0 - 100.0 fL   MCH 32.1  26.0 - 34.0 pg   MCHC 32.9  30.0 - 36.0 g/dL   RDW 08.6  57.8 - 46.9 %   Platelets 224  150 - 400 K/uL  WET PREP, GENITAL     Status: Abnormal   Collection Time   09/07/11  5:55 PM      Component Value Range   Yeast Wet Prep HPF POC NONE SEEN  NONE SEEN   Trich, Wet Prep NONE SEEN  NONE SEEN   Clue Cells Wet Prep HPF POC MODERATE (*) NONE SEEN   WBC, Wet Prep HPF POC FEW (*) NONE SEEN    Assessment and Plan  Bacterial vaginosis- prescription for Flagyl 500mg  BID for 7 days Contraception- prescription for OrthoCyclen until pt can get Depo  [name] 09/07/2011, 4:27 PM

## 2011-11-19 ENCOUNTER — Emergency Department (HOSPITAL_COMMUNITY)
Admission: EM | Admit: 2011-11-19 | Discharge: 2011-11-19 | Disposition: A | Discharge status: Home / Self Care | Payer: Self-pay | Attending: Emergency Medicine | Admitting: Emergency Medicine

## 2011-11-19 ENCOUNTER — Encounter (HOSPITAL_COMMUNITY): Payer: Self-pay | Admitting: Emergency Medicine

## 2011-11-19 DIAGNOSIS — Z87440 Personal history of urinary (tract) infections: Secondary | ICD-10-CM | POA: Insufficient documentation

## 2011-11-19 DIAGNOSIS — F17200 Nicotine dependence, unspecified, uncomplicated: Secondary | ICD-10-CM | POA: Insufficient documentation

## 2011-11-19 DIAGNOSIS — R112 Nausea with vomiting, unspecified: Secondary | ICD-10-CM | POA: Insufficient documentation

## 2011-11-19 LAB — COMPREHENSIVE METABOLIC PANEL
ALT: 7 U/L (ref 0–35)
AST: 18 U/L (ref 0–37)
Albumin: 4.6 g/dL (ref 3.5–5.2)
Alkaline Phosphatase: 85 U/L (ref 39–117)
Potassium: 3.4 mEq/L — ABNORMAL LOW (ref 3.5–5.1)
Sodium: 141 mEq/L (ref 135–145)
Total Protein: 8.3 g/dL (ref 6.0–8.3)

## 2011-11-19 LAB — URINE MICROSCOPIC-ADD ON

## 2011-11-19 LAB — CBC WITH DIFFERENTIAL/PLATELET
Basophils Absolute: 0 10*3/uL (ref 0.0–0.1)
Basophils Relative: 0 % (ref 0–1)
Eosinophils Absolute: 0.1 10*3/uL (ref 0.0–0.7)
Lymphs Abs: 2.8 10*3/uL (ref 0.7–4.0)
MCH: 33.3 pg (ref 26.0–34.0)
MCHC: 34.8 g/dL (ref 30.0–36.0)
Neutrophils Relative %: 54 % (ref 43–77)
Platelets: 241 10*3/uL (ref 150–400)
RBC: 4.35 MIL/uL (ref 3.87–5.11)
RDW: 12.3 % (ref 11.5–15.5)

## 2011-11-19 LAB — POCT PREGNANCY, URINE: Preg Test, Ur: NEGATIVE

## 2011-11-19 LAB — URINALYSIS, ROUTINE W REFLEX MICROSCOPIC
Bilirubin Urine: NEGATIVE
Hgb urine dipstick: NEGATIVE
Nitrite: NEGATIVE
Specific Gravity, Urine: 1.035 — ABNORMAL HIGH (ref 1.005–1.030)
pH: 8 (ref 5.0–8.0)

## 2011-11-19 MED ORDER — ONDANSETRON 4 MG PO TBDP
4.0000 mg | ORAL_TABLET | Freq: Once | ORAL | Status: AC
  Administered 2011-11-19: 4 mg via ORAL
  Filled 2011-11-19: qty 1

## 2011-11-19 MED ORDER — ONDANSETRON 4 MG PO TBDP
4.0000 mg | ORAL_TABLET | Freq: Once | ORAL | Status: AC
  Administered 2011-11-19: 4 mg via ORAL

## 2011-11-19 MED ORDER — PROMETHAZINE HCL 25 MG PO TABS: 25.0000 mg | ORAL_TABLET | Freq: Four times a day (QID) | ORAL | Status: DC | PRN

## 2011-11-19 MED ORDER — ONDANSETRON 4 MG PO TBDP
ORAL_TABLET | ORAL | Status: AC
  Filled 2011-11-19: qty 1

## 2011-11-19 NOTE — ED Notes (Signed)
Pt has had 1 1/2 cans of ginger ale. Denies nausea. States "I just feel woozy."

## 2011-11-19 NOTE — ED Notes (Signed)
Patient complaining of nausea and vomiting since 0400 this morning; reports 5 episodes of vomiting within the last 24 hours.  Patient denies abdominal pain and diarrhea.

## 2011-11-19 NOTE — ED Provider Notes (Signed)
History     CSN: [id number]  Arrival date & time 11/19/11  1847   First MD Initiated Contact with Patient 11/19/11 2223      Chief Complaint  Patient presents with  . Emesis    (Consider location/radiation/quality/duration/timing/severity/associated sxs/prior treatment) HPI Comments: Patient presents today with a chief complaint of nausea and vomiting.  Symptoms began earlier today.  She reports that she has had 5-7 episodes of vomiting.  No blood in her emesis.  She has not taken anything for symptoms prior to arrival.  She denies abdominal pain.  Denies diarrhea.  Last BM was earlier today and was normal.  She reports that she is currently on her menstrual cycle.  She denies vaginal discharge.  No fever or chills.  No known sick contacts.  The history is provided by the patient.    Past Medical History  Diagnosis Date  . Abnormal Pap smear   . Urinary tract infection     Past Surgical History  Procedure Date  . Leep   . Leep   . Hernia repair     As an infant    Family History  Problem Relation Age of Onset  . Hypertension Mother     History  Substance Use Topics  . Smoking status: Current Every Day Smoker -- 0.5 packs/day  . Smokeless tobacco: Never Used  . Alcohol Use: 0.0 oz/week     Comment: Weekends    OB History    Grav Para Term Preterm Abortions TAB SAB Ect Mult Living   2 2 0 0 0 0 0 0 0 2       Review of Systems  Constitutional: Negative for fever and chills.  Gastrointestinal: Positive for nausea and vomiting. Negative for abdominal pain, diarrhea, constipation, blood in stool and abdominal distention.  Genitourinary: Negative for dysuria, urgency, hematuria, flank pain, vaginal bleeding, vaginal discharge and pelvic pain.    Allergies  Review of patient's allergies indicates no known allergies.  Home Medications  No current outpatient prescriptions on file.  BP 132/83  Pulse 59  Temp 98.6 F (37 C) (Oral)  Resp 18  SpO2 96%  LMP  11/12/2011  Physical Exam  Nursing note and vitals reviewed. Constitutional: She appears well-developed and well-nourished. No distress.  HENT:  Head: Normocephalic and atraumatic.  Mouth/Throat: Oropharynx is clear and moist.  Neck: Neck supple.  Cardiovascular: Normal rate, regular rhythm and normal heart sounds.   Pulmonary/Chest: Effort normal and breath sounds normal.  Abdominal: Soft. Bowel sounds are normal. She exhibits no distension and no mass. There is no tenderness. There is no rebound and no guarding.  Neurological: She is alert.  Skin: Skin is warm and dry. She is not diaphoretic.  Psychiatric: She has a normal mood and affect.    ED Course  Procedures (including critical care time)  Labs Reviewed  COMPREHENSIVE METABOLIC PANEL - Abnormal; Notable for the following:    Potassium 3.4 (*)     All other components within normal limits  URINALYSIS, ROUTINE W REFLEX MICROSCOPIC - Abnormal; Notable for the following:    Color, Urine AMBER (*)  BIOCHEMICALS MAY BE AFFECTED BY COLOR   APPearance HAZY (*)     Specific Gravity, Urine 1.035 (*)     Ketones, ur 15 (*)     Protein, ur 100 (*)     Leukocytes, UA TRACE (*)     All other components within normal limits  URINE MICROSCOPIC-ADD ON - Abnormal; Notable for the following:  Squamous Epithelial / LPF FEW (*)     Bacteria, UA FEW (*)     All other components within normal limits  CBC WITH DIFFERENTIAL  LIPASE, BLOOD  POCT PREGNANCY, URINE  URINE CULTURE   No results found.   No diagnosis found.  Patient able to tolerate po liquids.  Patient drank 8 ounces of Ginger Ale without difficulty.  MDM  Patient presents with a chief complaint of nausea and vomiting.  No abdominal pain on exam.  Urine pregnancy and UA negative.  Labs unremarkable.  Patient able to tolerate po liquids.  Therefore, feel that patient can be discharged home.  Return precautions discussed.          Pascal Lux [place], PA-C 11/20/11  2314

## 2011-11-19 NOTE — ED Notes (Signed)
PT states that the Zofran has "helped me feel better." Reports mild nausea.

## 2011-11-19 NOTE — ED Notes (Addendum)
Pt states that "the ginger ale made my stomach feel better." Requesting more nausea medication.

## 2011-11-20 LAB — URINE CULTURE: Colony Count: NO GROWTH

## 2011-11-21 NOTE — ED Provider Notes (Signed)
Medical screening examination/treatment/procedure(s) were performed by non-physician practitioner and as supervising physician I was immediately available for consultation/collaboration.   Celene Kras, MD 11/21/11 1556

## 2011-11-24 ENCOUNTER — Inpatient Hospital Stay (HOSPITAL_COMMUNITY)
Admission: AD | Admit: 2011-11-24 | Discharge: 2011-11-24 | Disposition: A | Discharge status: Home / Self Care | Payer: Self-pay | Source: Ambulatory Visit | Attending: Obstetrics & Gynecology | Admitting: Obstetrics & Gynecology

## 2011-11-24 ENCOUNTER — Encounter (HOSPITAL_COMMUNITY): Payer: Self-pay | Admitting: *Deleted

## 2011-11-24 DIAGNOSIS — M79609 Pain in unspecified limb: Secondary | ICD-10-CM | POA: Insufficient documentation

## 2011-11-24 DIAGNOSIS — IMO0002 Reserved for concepts with insufficient information to code with codable children: Secondary | ICD-10-CM

## 2011-11-24 DIAGNOSIS — L02419 Cutaneous abscess of limb, unspecified: Secondary | ICD-10-CM

## 2011-11-24 MED ORDER — OXYCODONE-ACETAMINOPHEN 5-325 MG PO TABS
1.0000 | ORAL_TABLET | Freq: Once | ORAL | Status: AC
  Administered 2011-11-24: 1 via ORAL
  Filled 2011-11-24: qty 1

## 2011-11-24 NOTE — MAU Provider Note (Signed)
  History     CSN: [id number]  Arrival date and time: 11/24/11 2052   First Provider Initiated Contact with Patient 11/24/11 2241      Chief Complaint  Patient presents with  . Cyst   HPI MARKELLE ZARAGOZA 26 y.o. Here with swelling and pain under left arm for 2 days.  No other problems.  Has not tried any warm soaks or OTC pain medication.  OB History    Grav Para Term Preterm Abortions TAB SAB Ect Mult Living   2 2 0 0 0 0 0 0 0 2       Past Medical History  Diagnosis Date  . Abnormal Pap smear   . Urinary tract infection     Past Surgical History  Procedure Date  . Leep   . Leep   . Hernia repair     As an infant    Family History  Problem Relation Age of Onset  . Hypertension Mother     History  Substance Use Topics  . Smoking status: Current Every Day Smoker -- 0.5 packs/day  . Smokeless tobacco: Never Used  . Alcohol Use: 0.0 oz/week     Comment: Weekends    Allergies: No Known Allergies  No prescriptions prior to admission    Review of Systems  Constitutional: Negative for fever.  Gastrointestinal: Negative for nausea and vomiting.  Musculoskeletal:       Pain in left armpit   Physical Exam   Blood pressure 122/71, pulse 65, temperature 98.6 F (37 C), temperature source Oral, resp. rate 18, height 5\' 6"  (1.676 m), weight 75.297 kg (166 lb), last menstrual period 11/12/2011.  Physical Exam  Nursing note and vitals reviewed. Constitutional: She is oriented to person, place, and time. She appears well-developed and well-nourished.  HENT:  Head: Normocephalic.  Eyes: EOM are normal.  Neck: Neck supple.  Musculoskeletal: Normal range of motion.       Left armpit has swollen and very tender area which is 5cm x 2cm .  No area of pointing or drainage seen.  Almost unable to palpate due to the severity of client's pain.  Neurological: She is alert and oriented to person, place, and time.  Skin: Skin is warm and dry.  Psychiatric: She has a  normal mood and affect.    MAU Course  Procedures  MDM  Assessment and Plan  Abscess in left armpit  Plan Will give Percocet one tablet PO now for pain. Follow up at Urgent Care tomorrow for further evaluation and possible drainage.  Jabarri Stefanelli 11/24/2011, 11:11 PM

## 2011-11-24 NOTE — MAU Note (Signed)
Patient complains of a bump under the left arm pit that is causing a lot of pain for the last two days.

## 2011-11-25 ENCOUNTER — Encounter (HOSPITAL_COMMUNITY): Payer: Self-pay

## 2011-11-25 ENCOUNTER — Emergency Department (INDEPENDENT_AMBULATORY_CARE_PROVIDER_SITE_OTHER)
Admission: EM | Admit: 2011-11-25 | Discharge: 2011-11-25 | Disposition: A | Discharge status: Home / Self Care | Payer: Self-pay | Source: Home / Self Care | Attending: Family Medicine | Admitting: Family Medicine

## 2011-11-25 DIAGNOSIS — L02412 Cutaneous abscess of left axilla: Secondary | ICD-10-CM

## 2011-11-25 DIAGNOSIS — IMO0002 Reserved for concepts with insufficient information to code with codable children: Secondary | ICD-10-CM

## 2011-11-25 MED ORDER — SULFAMETHOXAZOLE-TRIMETHOPRIM 800-160 MG PO TABS: 1.0000 | ORAL_TABLET | Freq: Two times a day (BID) | ORAL | Status: AC

## 2011-11-25 MED ORDER — IBUPROFEN 600 MG PO TABS: 600.0000 mg | ORAL_TABLET | Freq: Three times a day (TID) | ORAL | Status: DC

## 2011-11-25 MED ORDER — TRAMADOL HCL 50 MG PO TABS: 50.0000 mg | ORAL_TABLET | Freq: Four times a day (QID) | ORAL | Status: DC | PRN

## 2011-11-25 NOTE — MAU Provider Note (Signed)
Attestation of Attending Supervision of Advanced Practitioner (CNM/NP): Evaluation and management procedures were performed by the Advanced Practitioner under my supervision and collaboration.  I have reviewed the Advanced Practitioner's note and chart, and I agree with the management and plan.  Nataly Pacifico, MD, FACOG Attending Obstetrician & Gynecologist Faculty Practice, [facility]  

## 2011-11-25 NOTE — ED Notes (Signed)
Patient c/o boil/abcess under left arm since 11/22/2011

## 2011-11-25 NOTE — ED Provider Notes (Signed)
History     CSN: [id number]  Arrival date & time 11/25/11  1349   First MD Initiated Contact with Patient 11/25/11 1419      Chief Complaint  Patient presents with  . Recurrent Skin Infections    (Consider location/radiation/quality/duration/timing/severity/associated sxs/prior treatment) HPI Comments: 26 year old nondiabetic female here complaining of a tender area in her left axilla for 3 days. She was seen yesterday at [facility] hospital was given a prescription for Percocet and asked to put warm compresses and come here today for follow up and possible I&D. Denies fever or chills. No spontaneous drainage. No nausea vomiting.   Past Medical History  Diagnosis Date  . Abnormal Pap smear   . Urinary tract infection     Past Surgical History  Procedure Date  . Leep   . Leep   . Hernia repair     As an infant    Family History  Problem Relation Age of Onset  . Hypertension Mother     History  Substance Use Topics  . Smoking status: Current Every Day Smoker -- 0.5 packs/day  . Smokeless tobacco: Never Used  . Alcohol Use: 0.0 oz/week     Comment: Weekends    OB History    Grav Para Term Preterm Abortions TAB SAB Ect Mult Living   2 2 0 0 0 0 0 0 0 2       Review of Systems  Constitutional: Negative for fever and chills.  Gastrointestinal: Negative for nausea and vomiting.  Skin:       As per HPI  Neurological: Negative for headaches.  All other systems reviewed and are negative.    Allergies  Review of patient's allergies indicates no known allergies.  Home Medications   Current Outpatient Rx  Name  Route  Sig  Dispense  Refill  . IBUPROFEN 600 MG PO TABS   Oral   Take 1 tablet (600 mg total) by mouth 3 (three) times daily. Take with food   20 tablet   0   . SULFAMETHOXAZOLE-TRIMETHOPRIM 800-160 MG PO TABS   Oral   Take 1 tablet by mouth 2 (two) times daily.   14 tablet   0   . TRAMADOL HCL 50 MG PO TABS   Oral   Take 1 tablet (50 mg  total) by mouth every 6 (six) hours as needed for pain.   15 tablet   0     BP 112/85  Pulse 63  Temp 98.7 F (37.1 C) (Oral)  Resp 22  SpO2 100%  LMP 11/12/2011  Physical Exam  Nursing note and vitals reviewed. Constitutional: She is oriented to person, place, and time. She appears well-developed and well-nourished. No distress.  HENT:  Head: Normocephalic and atraumatic.  Eyes: Conjunctivae normal are normal. No scleral icterus.  Neck: Neck supple.  Cardiovascular: Normal heart sounds.   Pulmonary/Chest: Breath sounds normal.  Lymphadenopathy:    She has no cervical adenopathy.  Neurological: She is alert and oriented to person, place, and time.  Skin: She is not diaphoretic.       Left axilla: 2 area of about 3 cm and 2 cm indurations and tenderness in left arm pit. Associated with focal erythema. No spontaneous drainage    ED Course  INCISION AND DRAINAGE Performed by: Sharin Grave Authorized by: Sharin Grave Consent: Verbal consent obtained. Risks and benefits: risks, benefits and alternatives were discussed Consent given by: patient Patient understanding: patient states understanding of the procedure being performed Patient consent:  the patient's understanding of the procedure matches consent given Type: abscess (infected epydermal cyst) Body area: upper extremity (left axilla) Anesthesia: local infiltration Local anesthetic: lidocaine 1% with epinephrine Anesthetic total: 3 ml Scalpel size: 11 Incision type: 2 single straight incitions. Complexity: simple Drainage: purulent and serous (one lesion with only sebaceus content excised. other lesion purulent  after sebaceus plug .) Drainage amount: moderate Wound treatment: wound left open Packing material: no packing. Patient tolerance: Patient tolerated the procedure well with no immediate complications. Comments: Antibiotic ointment and dry dressing applied on top.   (including critical care  time)  Labs Reviewed - No data to display No results found.   1. Cutaneous abscess of left axilla       MDM  Epidermal cyst over infected; status post incision and drainage today. Wound culture pending. Treated with Septra, ibuprofen and tramadol. Wound care instructions and red flags that should prompt her return to medical attention discussed with patient and provided in writing.          Sharin Grave, MD 11/26/11 [phone]

## 2011-12-05 ENCOUNTER — Emergency Department (HOSPITAL_COMMUNITY)
Admission: EM | Admit: 2011-12-05 | Discharge: 2011-12-06 | Disposition: A | Discharge status: Home / Self Care | Payer: Self-pay | Attending: Emergency Medicine | Admitting: Emergency Medicine

## 2011-12-05 ENCOUNTER — Encounter (HOSPITAL_COMMUNITY): Payer: Self-pay | Admitting: *Deleted

## 2011-12-05 DIAGNOSIS — Z87440 Personal history of urinary (tract) infections: Secondary | ICD-10-CM | POA: Insufficient documentation

## 2011-12-05 DIAGNOSIS — K2920 Alcoholic gastritis without bleeding: Secondary | ICD-10-CM | POA: Insufficient documentation

## 2011-12-05 DIAGNOSIS — R1013 Epigastric pain: Secondary | ICD-10-CM | POA: Insufficient documentation

## 2011-12-05 DIAGNOSIS — F17200 Nicotine dependence, unspecified, uncomplicated: Secondary | ICD-10-CM | POA: Insufficient documentation

## 2011-12-05 MED ORDER — SODIUM CHLORIDE 0.9 % IV BOLUS (SEPSIS)
1000.0000 mL | Freq: Once | INTRAVENOUS | Status: AC
  Administered 2011-12-05: 1000 mL via INTRAVENOUS

## 2011-12-05 MED ORDER — FAMOTIDINE 20 MG PO TABS
20.0000 mg | ORAL_TABLET | Freq: Once | ORAL | Status: AC
  Administered 2011-12-06: 20 mg via ORAL
  Filled 2011-12-05: qty 1

## 2011-12-05 MED ORDER — SODIUM CHLORIDE 0.9 % IV BOLUS (SEPSIS)
1000.0000 mL | Freq: Once | INTRAVENOUS | Status: AC
  Administered 2011-12-06: 1000 mL via INTRAVENOUS

## 2011-12-05 MED ORDER — ONDANSETRON 8 MG PO TBDP
8.0000 mg | ORAL_TABLET | Freq: Once | ORAL | Status: AC
  Administered 2011-12-05: 8 mg via ORAL
  Filled 2011-12-05: qty 1

## 2011-12-05 MED ORDER — PANTOPRAZOLE SODIUM 40 MG IV SOLR
40.0000 mg | Freq: Once | INTRAVENOUS | Status: AC
  Administered 2011-12-05: 40 mg via INTRAVENOUS
  Filled 2011-12-05: qty 40

## 2011-12-05 MED ORDER — GI COCKTAIL ~~LOC~~
30.0000 mL | Freq: Once | ORAL | Status: AC
  Administered 2011-12-06: 30 mL via ORAL
  Filled 2011-12-05: qty 30

## 2011-12-05 NOTE — ED Provider Notes (Signed)
History     CSN: [id number]  Arrival date & time 12/05/11  2156   First MD Initiated Contact with Patient 12/05/11 2254      Chief Complaint  Patient presents with  . Emesis  . Alcohol Intoxication    (Consider location/radiation/quality/duration/timing/severity/associated sxs/prior treatment) HPI History provided by patient. Presents with epigastric pain after being awake for over 24 hours. On Friday night admits to excessive amounts of alcohol and taking ecstasy. She drank a half gallon of liquor and 6 pack of beer and tonight is vomiting and feels terrible presents here for evaluation. No hematemesis. No fevers or chills. No trauma. She denies syncope. No diarrhea. Moderate severity. Past Medical History  Diagnosis Date  . Abnormal Pap smear   . Urinary tract infection     Past Surgical History  Procedure Date  . Leep   . Leep   . Hernia repair     As an infant    Family History  Problem Relation Age of Onset  . Hypertension Mother     History  Substance Use Topics  . Smoking status: Current Every Day Smoker -- 0.5 packs/day    Types: Cigarettes  . Smokeless tobacco: Never Used  . Alcohol Use: 0.0 oz/week     Comment: Weekends    OB History    Grav Para Term Preterm Abortions TAB SAB Ect Mult Living   2 2 0 0 0 0 0 0 0 2       Review of Systems  Constitutional: Negative for fever and chills.  HENT: Negative for neck pain and neck stiffness.   Eyes: Negative for pain.  Respiratory: Negative for shortness of breath.   Cardiovascular: Negative for chest pain.  Gastrointestinal: Positive for vomiting and abdominal pain. Negative for blood in stool.  Genitourinary: Negative for dysuria.  Musculoskeletal: Negative for back pain.  Skin: Negative for rash.  Neurological: Negative for seizures.  All other systems reviewed and are negative.    Allergies  Review of patient's allergies indicates no known allergies.  Home Medications   Current Outpatient  Rx  Name  Route  Sig  Dispense  Refill  . IBUPROFEN 600 MG PO TABS   Oral   Take 1 tablet (600 mg total) by mouth 3 (three) times daily. Take with food   20 tablet   0   . TRAMADOL HCL 50 MG PO TABS   Oral   Take 1 tablet (50 mg total) by mouth every 6 (six) hours as needed for pain.   15 tablet   0     BP 124/64  Pulse 64  Temp 97.5 F (36.4 C) (Oral)  Resp 15  Ht 5\' 7"  (1.702 m)  Wt 166 lb (75.297 kg)  BMI 26.00 kg/m2  SpO2 100%  LMP 11/12/2011  Physical Exam  Constitutional: She is oriented to person, place, and time. She appears well-developed and well-nourished.  HENT:  Head: Normocephalic and atraumatic.       Mildly dry mm  Eyes: Conjunctivae normal and EOM are normal. Pupils are equal, round, and reactive to light.  Neck: Trachea normal. Neck supple. No thyromegaly present.  Cardiovascular: Normal rate, regular rhythm, S1 normal, S2 normal and normal pulses.     No systolic murmur is present   No diastolic murmur is present  Pulses:      Radial pulses are 2+ on the right side, and 2+ on the left side.  Pulmonary/Chest: Effort normal and breath sounds normal. She has no  wheezes. She has no rhonchi. She has no rales. She exhibits no tenderness.  Abdominal: Soft. Normal appearance and bowel sounds are normal. There is no CVA tenderness and negative Murphy's sign.       TTP epigastric , no acute ABD  Musculoskeletal:       BLE:s Calves nontender, no cords or erythema, negative Homans sign  Neurological: She is alert and oriented to person, place, and time. She has normal strength. No cranial nerve deficit or sensory deficit. GCS eye subscore is 4. GCS verbal subscore is 5. GCS motor subscore is 6.  Skin: Skin is warm and dry. No rash noted. She is not diaphoretic.  Psychiatric: Her speech is normal.       Cooperative and appropriate    ED Course  Procedures (including critical care time)  Results for orders placed during the hospital encounter of 12/05/11    LIPASE, BLOOD      Component Value Range   Lipase 13  11 - 59 U/L    IVFs. zofran Pepcid, protonix. GI cocktail  4:01 AM condition improved. No emesis in ED. Tolerating by mouth fluids.  Alcohol precautions verbalizes understood. Plan discharge home with Pepcid Protonix MDM   Epigastric pain after drinking excessive amounts of alcohol. Presentation suggests alcohol gastritis. Left upper quadrant abdominal pain lipase checked as above is within normal limits. IV fluids provided. GI medications provided. Condition improved.        Sunnie Nielsen, MD 12/06/11 [phone]

## 2011-12-05 NOTE — ED Notes (Signed)
Pt to room via [facility] for c/o N/V, ETOH; pt reports out drinking last pm until 3am; pt reports >1/2 gallon of liquor and more than a 6 pack of beer; pt also reports taking 1 unknown pill; pt reports N/V since early am; diarrhea x 2; LUQ abd pain. Pt received 20g IV to Rt AC and 4mg  Zofran en route via EMS.

## 2011-12-05 NOTE — ED Notes (Signed)
Pt reports constant nausea and vomiting after ETOH consumption last pm; pt reports drinking more than 1/2 gallon of liquor and more than a 6 pack of beer. Pt also reports taking unknown pill while out last pm as well. Pt now c/o N/V abd pain to LUQ.

## 2011-12-05 NOTE — ED Notes (Signed)
WUJ:[id number]<JY> Expected date:12/05/11<BR> Expected time: 9:30 PM<BR> Means of arrival:Ambulance<BR> Comments:<BR> RM 10: ETOH, n/v

## 2011-12-06 MED ORDER — LORAZEPAM 2 MG/ML IJ SOLN
1.0000 mg | Freq: Once | INTRAMUSCULAR | Status: AC
  Administered 2011-12-06: 1 mg via INTRAVENOUS
  Filled 2011-12-06: qty 1

## 2011-12-06 MED ORDER — HYDROMORPHONE HCL PF 1 MG/ML IJ SOLN: 1.0000 mg | Freq: Once | INTRAMUSCULAR | Status: DC

## 2011-12-06 MED ORDER — ONDANSETRON HCL 4 MG/2ML IJ SOLN
4.0000 mg | Freq: Once | INTRAMUSCULAR | Status: AC
  Administered 2011-12-06: 4 mg via INTRAVENOUS
  Filled 2011-12-06: qty 2

## 2011-12-06 MED ORDER — FAMOTIDINE 20 MG PO TABS: 20.0000 mg | ORAL_TABLET | Freq: Two times a day (BID) | ORAL | Status: DC

## 2011-12-06 MED ORDER — PANTOPRAZOLE SODIUM 20 MG PO TBEC: 20.0000 mg | DELAYED_RELEASE_TABLET | Freq: Every day | ORAL | Status: DC

## 2011-12-06 MED ORDER — SODIUM CHLORIDE 0.9 % IV SOLN
INTRAVENOUS | Status: DC
  Administered 2011-12-06: 01:00:00 via INTRAVENOUS

## 2011-12-06 NOTE — ED Notes (Signed)
Message left for mother and sister to come pick up pt; pt does not know anyone else to call at present.

## 2011-12-06 NOTE — ED Notes (Signed)
Pt taking sips of fluids but makes gaging noise while taking sips; no emesis noted; Dr Dierdre Highman notified.

## 2011-12-06 NOTE — ED Notes (Signed)
Pt has ambulated to BR x 2 without difficulty; steady gait.

## 2011-12-07 ENCOUNTER — Emergency Department (HOSPITAL_COMMUNITY)
Admission: EM | Admit: 2011-12-07 | Discharge: 2011-12-07 | Disposition: A | Discharge status: Home / Self Care | Payer: Self-pay | Attending: Emergency Medicine | Admitting: Emergency Medicine

## 2011-12-07 ENCOUNTER — Encounter (HOSPITAL_COMMUNITY): Payer: Self-pay | Admitting: *Deleted

## 2011-12-07 DIAGNOSIS — B9689 Other specified bacterial agents as the cause of diseases classified elsewhere: Secondary | ICD-10-CM

## 2011-12-07 DIAGNOSIS — Z3202 Encounter for pregnancy test, result negative: Secondary | ICD-10-CM | POA: Insufficient documentation

## 2011-12-07 DIAGNOSIS — R112 Nausea with vomiting, unspecified: Secondary | ICD-10-CM | POA: Insufficient documentation

## 2011-12-07 DIAGNOSIS — N760 Acute vaginitis: Secondary | ICD-10-CM | POA: Insufficient documentation

## 2011-12-07 DIAGNOSIS — F17200 Nicotine dependence, unspecified, uncomplicated: Secondary | ICD-10-CM | POA: Insufficient documentation

## 2011-12-07 DIAGNOSIS — R51 Headache: Secondary | ICD-10-CM | POA: Insufficient documentation

## 2011-12-07 DIAGNOSIS — N390 Urinary tract infection, site not specified: Secondary | ICD-10-CM | POA: Insufficient documentation

## 2011-12-07 LAB — URINALYSIS, ROUTINE W REFLEX MICROSCOPIC
Glucose, UA: NEGATIVE mg/dL
Ketones, ur: >80 mg/dL — AB
Nitrite: NEGATIVE
Protein, ur: 30 mg/dL — AB
Specific Gravity, Urine: 1.037 — ABNORMAL HIGH (ref 1.005–1.030)
Urobilinogen, UA: 1 mg/dL (ref 0.0–1.0)
pH: 7.5 (ref 5.0–8.0)

## 2011-12-07 LAB — CBC WITH DIFFERENTIAL/PLATELET
Basophils Absolute: 0 10*3/uL (ref 0.0–0.1)
Basophils Relative: 0 % (ref 0–1)
Eosinophils Absolute: 0 10*3/uL (ref 0.0–0.7)
Eosinophils Relative: 0 % (ref 0–5)
HCT: 41.9 % (ref 36.0–46.0)
Hemoglobin: 14.6 g/dL (ref 12.0–15.0)
Lymphocytes Relative: 30 % (ref 12–46)
Lymphs Abs: 2.3 10*3/uL (ref 0.7–4.0)
MCH: 33 pg (ref 26.0–34.0)
MCHC: 34.8 g/dL (ref 30.0–36.0)
MCV: 94.6 fL (ref 78.0–100.0)
Monocytes Absolute: 0.7 10*3/uL (ref 0.1–1.0)
Monocytes Relative: 10 % (ref 3–12)
Neutro Abs: 4.7 10*3/uL (ref 1.7–7.7)
Neutrophils Relative %: 61 % (ref 43–77)
Platelets: 224 10*3/uL (ref 150–400)
RBC: 4.43 MIL/uL (ref 3.87–5.11)
RDW: 12.3 % (ref 11.5–15.5)
WBC: 7.8 10*3/uL (ref 4.0–10.5)

## 2011-12-07 LAB — BASIC METABOLIC PANEL
BUN: 10 mg/dL (ref 6–23)
CO2: 27 mEq/L (ref 19–32)
Calcium: 10 mg/dL (ref 8.4–10.5)
Chloride: 101 mEq/L (ref 96–112)
Creatinine, Ser: 0.73 mg/dL (ref 0.50–1.10)
GFR calc Af Amer: >90 mL/min (ref >90)
GFR calc non Af Amer: >90 mL/min (ref >90)
Glucose, Bld: 99 mg/dL (ref 70–99)
Potassium: 3 mEq/L — ABNORMAL LOW (ref 3.5–5.1)
Sodium: 140 mEq/L (ref 135–145)

## 2011-12-07 LAB — URINE MICROSCOPIC-ADD ON

## 2011-12-07 LAB — POCT PREGNANCY, URINE: Preg Test, Ur: NEGATIVE

## 2011-12-07 LAB — LIPASE, BLOOD: Lipase: 28 U/L (ref 11–59)

## 2011-12-07 LAB — WET PREP, GENITAL: Trich, Wet Prep: NONE SEEN

## 2011-12-07 MED ORDER — SODIUM CHLORIDE 0.9 % IV BOLUS (SEPSIS)
1000.0000 mL | Freq: Once | INTRAVENOUS | Status: AC
  Administered 2011-12-07: 1000 mL via INTRAVENOUS

## 2011-12-07 MED ORDER — HYDROMORPHONE HCL PF 1 MG/ML IJ SOLN
1.0000 mg | Freq: Once | INTRAMUSCULAR | Status: AC
  Administered 2011-12-07: 1 mg via INTRAVENOUS
  Filled 2011-12-07: qty 1

## 2011-12-07 MED ORDER — ONDANSETRON HCL 4 MG PO TABS: 4.0000 mg | ORAL_TABLET | Freq: Four times a day (QID) | ORAL | Status: DC

## 2011-12-07 MED ORDER — ONDANSETRON HCL 4 MG/2ML IJ SOLN
4.0000 mg | Freq: Once | INTRAMUSCULAR | Status: AC
  Administered 2011-12-07: 4 mg via INTRAVENOUS
  Filled 2011-12-07: qty 2

## 2011-12-07 MED ORDER — METRONIDAZOLE 500 MG PO TABS: 500.0000 mg | ORAL_TABLET | Freq: Two times a day (BID) | ORAL | Status: DC

## 2011-12-07 MED ORDER — CEPHALEXIN 500 MG PO CAPS: 500.0000 mg | ORAL_CAPSULE | Freq: Four times a day (QID) | ORAL | Status: DC

## 2011-12-07 NOTE — ED Provider Notes (Signed)
History     CSN: [id number]  Arrival date & time 12/07/11  1233   First MD Initiated Contact with Patient 12/07/11 1345      Chief Complaint  Patient presents with  . Emesis  . Abdominal Pain    (Consider location/radiation/quality/duration/timing/severity/associated sxs/prior treatment) HPI Comments: 26 year old female presents the emergency department complaining of continuing nausea and vomiting since early Saturday morning. She was seen in the emergency department yesterday morning and was diagnosed with alcoholic gastritis. Patient states she was unable to get medications filled due to money problems. The nausea and vomiting has worsened and now she has developed generalized abdominal pain. Denies any more alcohol intake. Admits to associated thick vaginal discharge. Denies vaginal bleeding. States she is beginning to feel very weak. Unable to keep any food down.  The history is provided by the patient.    Past Medical History  Diagnosis Date  . Abnormal Pap smear   . Urinary tract infection     Past Surgical History  Procedure Date  . Leep   . Leep   . Hernia repair     As an infant    Family History  Problem Relation Age of Onset  . Hypertension Mother     History  Substance Use Topics  . Smoking status: Current Every Day Smoker -- 0.5 packs/day    Types: Cigarettes  . Smokeless tobacco: Never Used  . Alcohol Use: 0.0 oz/week     Comment: Weekends    OB History    Grav Para Term Preterm Abortions TAB SAB Ect Mult Living   2 2 0 0 0 0 0 0 0 2       Review of Systems  Constitutional: Negative for fever and chills.  HENT: Negative for neck pain and neck stiffness.   Respiratory: Negative for shortness of breath.   Cardiovascular: Negative for chest pain.  Gastrointestinal: Positive for nausea, vomiting and abdominal pain.  Genitourinary: Positive for vaginal discharge. Negative for dysuria, urgency, frequency, vaginal bleeding and difficulty urinating.   Musculoskeletal: Negative.   Skin: Negative.   Neurological: Positive for headaches. Negative for dizziness and light-headedness.  Psychiatric/Behavioral: Negative for confusion.    Allergies  Review of patient's allergies indicates no known allergies.  Home Medications   Current Outpatient Rx  Name  Route  Sig  Dispense  Refill  . FAMOTIDINE 20 MG PO TABS   Oral   Take 1 tablet (20 mg total) by mouth 2 (two) times daily.   30 tablet   0   . IBUPROFEN 600 MG PO TABS   Oral   Take 1 tablet (600 mg total) by mouth 3 (three) times daily. Take with food   20 tablet   0   . PANTOPRAZOLE SODIUM 20 MG PO TBEC   Oral   Take 1 tablet (20 mg total) by mouth daily.   30 tablet   0   . TRAMADOL HCL 50 MG PO TABS   Oral   Take 1 tablet (50 mg total) by mouth every 6 (six) hours as needed for pain.   15 tablet   0     BP 125/71  Pulse 53  Temp 98.5 F (36.9 C) (Oral)  Resp 20  SpO2 98%  LMP 11/12/2011  Physical Exam  Nursing note and vitals reviewed. Constitutional: She is oriented to person, place, and time. She appears well-developed and well-nourished. No distress.       Groaning while laying in the bed.  HENT:  Head: Normocephalic and atraumatic.  Mouth/Throat: Oropharynx is clear and moist.  Eyes: Conjunctivae normal and EOM are normal. Pupils are equal, round, and reactive to light.  Neck: Normal range of motion. Neck supple.  Cardiovascular: Normal rate, regular rhythm, normal heart sounds and intact distal pulses.   Pulmonary/Chest: Effort normal and breath sounds normal.  Abdominal: Soft. Normal appearance and bowel sounds are normal. There is tenderness in the suprapubic area. There is guarding. There is no rigidity, no rebound and no CVA tenderness.  Genitourinary: Uterus normal. Cervix exhibits discharge. Cervix exhibits no motion tenderness and no friability. Right adnexum displays no mass, no tenderness and no fullness. Left adnexum displays no mass, no  tenderness and no fullness. No erythema, tenderness or bleeding around the vagina. Vaginal discharge (thick, white, strong foul odor) found.  Neurological: She is alert and oriented to person, place, and time.  Skin: Skin is warm and dry.  Psychiatric: Her speech is normal.       Poor eye contact.    ED Course  Procedures (including critical care time)  Labs Reviewed  BASIC METABOLIC PANEL - Abnormal; Notable for the following:    Potassium 3.0 (*)     All other components within normal limits  URINALYSIS, ROUTINE W REFLEX MICROSCOPIC - Abnormal; Notable for the following:    Color, Urine AMBER (*)  BIOCHEMICALS MAY BE AFFECTED BY COLOR   APPearance HAZY (*)     Specific Gravity, Urine 1.037 (*)     Hgb urine dipstick TRACE (*)     Bilirubin Urine SMALL (*)     Ketones, ur >80 (*)     Protein, ur 30 (*)     Leukocytes, UA SMALL (*)     All other components within normal limits  WET PREP, GENITAL - Abnormal; Notable for the following:    Clue Cells Wet Prep HPF POC FEW (*)     WBC, Wet Prep HPF POC FEW (*)     All other components within normal limits  URINE MICROSCOPIC-ADD ON - Abnormal; Notable for the following:    Squamous Epithelial / LPF FEW (*)     Bacteria, UA FEW (*)     All other components within normal limits  CBC WITH DIFFERENTIAL  LIPASE, BLOOD  POCT PREGNANCY, URINE  GC/CHLAMYDIA PROBE AMP  URINE CULTURE   No results found.   1. Urinary tract infection   2. Nausea & vomiting   3. BV (bacterial vaginosis)       MDM  26 y/o female diagnosed with alcoholic gastritis yesterday returning to ED with continuing nausea, vomiting and vaginal discharge. Urine +UTI. Foul smelling thick white discharge on pelvic exam. Wet prep with few clue cells, correlated with exam will treat for BV. Keflex given for UTI, flagyl for BV, zofran for nausea. Return precautions discussed.       Trevor Mace, PA-C 12/07/11 [phone]

## 2011-12-07 NOTE — ED Notes (Signed)
Pt states she was seen here on Friday and has not been able to stop vomiting since pt feels so weak has not been able to get rx filled she had no $

## 2011-12-07 NOTE — ED Notes (Signed)
Pt feels much better has stopped vomiting 

## 2011-12-07 NOTE — ED Notes (Signed)
Pt is here with vomiting since Saturday and left lower abdominal pain.  Pt does report some diarrhea

## 2011-12-08 LAB — URINE CULTURE
Colony Count: NO GROWTH
Culture: NO GROWTH

## 2011-12-08 LAB — GC/CHLAMYDIA PROBE AMP: GC Probe RNA: NEGATIVE

## 2011-12-08 NOTE — ED Provider Notes (Signed)
Medical screening examination/treatment/procedure(s) were performed by non-physician practitioner and as supervising physician I was immediately available for consultation/collaboration.  Raeford Razor, MD 12/08/11 [phone]

## 2011-12-09 ENCOUNTER — Emergency Department (HOSPITAL_COMMUNITY)
Admission: EM | Admit: 2011-12-09 | Discharge: 2011-12-09 | Disposition: A | Discharge status: Home / Self Care | Payer: Self-pay | Attending: Emergency Medicine | Admitting: Emergency Medicine

## 2011-12-09 ENCOUNTER — Emergency Department (HOSPITAL_COMMUNITY): Payer: Self-pay

## 2011-12-09 ENCOUNTER — Encounter (HOSPITAL_COMMUNITY): Payer: Self-pay | Admitting: Emergency Medicine

## 2011-12-09 DIAGNOSIS — F17200 Nicotine dependence, unspecified, uncomplicated: Secondary | ICD-10-CM | POA: Insufficient documentation

## 2011-12-09 DIAGNOSIS — E876 Hypokalemia: Secondary | ICD-10-CM | POA: Insufficient documentation

## 2011-12-09 DIAGNOSIS — Z87440 Personal history of urinary (tract) infections: Secondary | ICD-10-CM | POA: Insufficient documentation

## 2011-12-09 DIAGNOSIS — R109 Unspecified abdominal pain: Secondary | ICD-10-CM | POA: Insufficient documentation

## 2011-12-09 DIAGNOSIS — Z79899 Other long term (current) drug therapy: Secondary | ICD-10-CM | POA: Insufficient documentation

## 2011-12-09 DIAGNOSIS — R112 Nausea with vomiting, unspecified: Secondary | ICD-10-CM

## 2011-12-09 DIAGNOSIS — B9689 Other specified bacterial agents as the cause of diseases classified elsewhere: Secondary | ICD-10-CM

## 2011-12-09 DIAGNOSIS — N83209 Unspecified ovarian cyst, unspecified side: Secondary | ICD-10-CM | POA: Insufficient documentation

## 2011-12-09 DIAGNOSIS — N760 Acute vaginitis: Secondary | ICD-10-CM | POA: Insufficient documentation

## 2011-12-09 DIAGNOSIS — N83201 Unspecified ovarian cyst, right side: Secondary | ICD-10-CM

## 2011-12-09 DIAGNOSIS — N390 Urinary tract infection, site not specified: Secondary | ICD-10-CM | POA: Insufficient documentation

## 2011-12-09 LAB — URINALYSIS, ROUTINE W REFLEX MICROSCOPIC
Glucose, UA: NEGATIVE mg/dL
Ketones, ur: 40 mg/dL — AB
Leukocytes, UA: NEGATIVE
Protein, ur: 30 mg/dL — AB

## 2011-12-09 LAB — COMPREHENSIVE METABOLIC PANEL
AST: 33 U/L (ref 0–37)
BUN: 7 mg/dL (ref 6–23)
CO2: 28 mEq/L (ref 19–32)
Chloride: 98 mEq/L (ref 96–112)
Creatinine, Ser: 0.89 mg/dL (ref 0.50–1.10)
GFR calc non Af Amer: 87 mL/min — ABNORMAL LOW (ref >90)
Total Bilirubin: 1.4 mg/dL — ABNORMAL HIGH (ref 0.3–1.2)

## 2011-12-09 LAB — URINE MICROSCOPIC-ADD ON

## 2011-12-09 LAB — CBC WITH DIFFERENTIAL/PLATELET
HCT: 43.9 % (ref 36.0–46.0)
Hemoglobin: 14.8 g/dL (ref 12.0–15.0)
Lymphocytes Relative: 43 % (ref 12–46)
Monocytes Absolute: 0.5 10*3/uL (ref 0.1–1.0)
Monocytes Relative: 8 % (ref 3–12)
Neutro Abs: 3 10*3/uL (ref 1.7–7.7)
WBC: 6.2 10*3/uL (ref 4.0–10.5)

## 2011-12-09 MED ORDER — ONDANSETRON HCL 4 MG/2ML IJ SOLN
4.0000 mg | Freq: Once | INTRAMUSCULAR | Status: AC
  Administered 2011-12-09: 4 mg via INTRAVENOUS
  Filled 2011-12-09: qty 2

## 2011-12-09 MED ORDER — OXYCODONE-ACETAMINOPHEN 5-325 MG PO TABS: ORAL_TABLET | ORAL | Status: DC

## 2011-12-09 MED ORDER — POTASSIUM CHLORIDE 20 MEQ/15ML (10%) PO LIQD
40.0000 meq | Freq: Once | ORAL | Status: AC
  Administered 2011-12-09: 40 meq via ORAL
  Filled 2011-12-09: qty 30

## 2011-12-09 MED ORDER — PROMETHAZINE HCL 25 MG/ML IJ SOLN
25.0000 mg | Freq: Once | INTRAMUSCULAR | Status: AC
  Administered 2011-12-09: 25 mg via INTRAVENOUS
  Filled 2011-12-09: qty 1

## 2011-12-09 MED ORDER — SODIUM CHLORIDE 0.9 % IV BOLUS (SEPSIS)
1000.0000 mL | Freq: Once | INTRAVENOUS | Status: AC
  Administered 2011-12-09: 1000 mL via INTRAVENOUS

## 2011-12-09 MED ORDER — CEPHALEXIN 500 MG PO CAPS: 500.0000 mg | ORAL_CAPSULE | Freq: Four times a day (QID) | ORAL | Status: DC

## 2011-12-09 MED ORDER — IOHEXOL 300 MG/ML  SOLN: 20.0000 mL | INTRAMUSCULAR | Status: AC

## 2011-12-09 MED ORDER — SODIUM CHLORIDE 0.9 % IV SOLN
Freq: Once | INTRAVENOUS | Status: AC
  Administered 2011-12-09: 16:00:00 via INTRAVENOUS

## 2011-12-09 MED ORDER — IOHEXOL 300 MG/ML  SOLN
100.0000 mL | Freq: Once | INTRAMUSCULAR | Status: AC | PRN
  Administered 2011-12-09: 100 mL via INTRAVENOUS

## 2011-12-09 MED ORDER — POTASSIUM CHLORIDE 10 MEQ/100ML IV SOLN
10.0000 meq | Freq: Once | INTRAVENOUS | Status: AC
  Administered 2011-12-09: 10 meq via INTRAVENOUS
  Filled 2011-12-09: qty 100

## 2011-12-09 MED ORDER — METRONIDAZOLE 500 MG PO TABS: 500.0000 mg | ORAL_TABLET | Freq: Two times a day (BID) | ORAL | Status: DC

## 2011-12-09 MED ORDER — MORPHINE SULFATE 4 MG/ML IJ SOLN
4.0000 mg | Freq: Once | INTRAMUSCULAR | Status: AC
  Administered 2011-12-09: 4 mg via INTRAVENOUS
  Filled 2011-12-09: qty 1

## 2011-12-09 NOTE — ED Notes (Signed)
Pt returned from radiology.

## 2011-12-09 NOTE — ED Provider Notes (Signed)
Dawn Brock is a 26 y.o. female transferred to CDU from [facility]. Signout from [place] Muthersbaugh as follows: Patient is into the emergency room 3 times in the last 5 days for abdominal pain, nausea and vomiting. Last time she was seen, a pelvic exam was performed she was diagnosed with bacterial vaginosis and UTI; started on Keflex and Flagyl. Patient is presenting today with persistent abdominal pain and vomiting.  Plan is to rehydrate the patient, obtaining abdominal CT and performed by mouth challenge.  Pt seen and examined at the bedside she is resting comfortably and states that her abdominal pain has resolved and now she has low back pain, rated at 7/10. She is tolerating her PO contrast. Lung sounds are clear to auscultation bilaterally, there is no CVA tenderness bilaterally, heart is regular rate and rhythm with no murmurs rubs and gallops. Patient has normal bowel sounds with no tenderness to palpation or peritoneal signs.  5:20 repeat abdominal exam remains benign and nonsurgical. Patient states that she could not afford her Keflex or Flagyl she only filled the prescription for Zofran. She states that she went to [facility] and a total of all 3 medications was $90.   Results for orders placed during the hospital encounter of 12/09/11  CBC WITH DIFFERENTIAL      Component Value Range   WBC 6.2  4.0 - 10.5 K/uL   RBC 4.67  3.87 - 5.11 MIL/uL   Hemoglobin 14.8  12.0 - 15.0 g/dL   HCT 16.1  09.6 - 04.5 %   MCV 94.0  78.0 - 100.0 fL   MCH 31.7  26.0 - 34.0 pg   MCHC 33.7  30.0 - 36.0 g/dL   RDW 40.9  81.1 - 91.4 %   Platelets 210  150 - 400 K/uL   Neutrophils Relative 49  43 - 77 %   Neutro Abs 3.0  1.7 - 7.7 K/uL   Lymphocytes Relative 43  12 - 46 %   Lymphs Abs 2.6  0.7 - 4.0 K/uL   Monocytes Relative 8  3 - 12 %   Monocytes Absolute 0.5  0.1 - 1.0 K/uL   Eosinophils Relative 0  0 - 5 %   Eosinophils Absolute 0.0  0.0 - 0.7 K/uL   Basophils Relative 0  0 - 1 %   Basophils  Absolute 0.0  0.0 - 0.1 K/uL  COMPREHENSIVE METABOLIC PANEL      Component Value Range   Sodium 138  135 - 145 mEq/L   Potassium 3.0 (*) 3.5 - 5.1 mEq/L   Chloride 98  96 - 112 mEq/L   CO2 28  19 - 32 mEq/L   Glucose, Bld 99  70 - 99 mg/dL   BUN 7  6 - 23 mg/dL   Creatinine, Ser 7.82  0.50 - 1.10 mg/dL   Calcium 9.9  8.4 - 95.6 mg/dL   Total Protein 8.3  6.0 - 8.3 g/dL   Albumin 4.5  3.5 - 5.2 g/dL   AST 33  0 - 37 U/L   ALT 22  0 - 35 U/L   Alkaline Phosphatase 82  39 - 117 U/L   Total Bilirubin 1.4 (*) 0.3 - 1.2 mg/dL   GFR calc non Af Amer 87 (*) >90 mL/min   GFR calc Af Amer >90  >90 mL/min  LIPASE, BLOOD      Component Value Range   Lipase 31  11 - 59 U/L  URINALYSIS, ROUTINE W REFLEX MICROSCOPIC  Component Value Range   Color, Urine AMBER (*) YELLOW   APPearance CLOUDY (*) CLEAR   Specific Gravity, Urine 1.027  1.005 - 1.030   pH 7.5  5.0 - 8.0   Glucose, UA NEGATIVE  NEGATIVE mg/dL   Hgb urine dipstick NEGATIVE  NEGATIVE   Bilirubin Urine SMALL (*) NEGATIVE   Ketones, ur 40 (*) NEGATIVE mg/dL   Protein, ur 30 (*) NEGATIVE mg/dL   Urobilinogen, UA 2.0 (*) 0.0 - 1.0 mg/dL   Nitrite NEGATIVE  NEGATIVE   Leukocytes, UA NEGATIVE  NEGATIVE  POCT PREGNANCY, URINE      Component Value Range   Preg Test, Ur NEGATIVE  NEGATIVE  URINE MICROSCOPIC-ADD ON      Component Value Range   Squamous Epithelial / LPF RARE  RARE   WBC, UA 0-2  <3 WBC/hpf   RBC / HPF 0-2  <3 RBC/hpf   Bacteria, UA FEW (*) RARE   Urine-Other AMORPHOUS URATES/PHOSPHATES     Ct Abdomen Pelvis W Contrast  12/09/2011  *RADIOLOGY REPORT*  Clinical Data: Nausea and vomiting  CT ABDOMEN AND PELVIS WITH CONTRAST  Technique:  Multidetector CT imaging of the abdomen and pelvis was performed following the standard protocol during bolus administration of intravenous contrast.  Contrast: OMNIPAQUE IOHEXOL 300 MG/ML  SOLN  Comparison: Pelvic ultrasound 11/21/2008  Findings: Lung bases are clear. No  pericardial fluid.  No focal hepatic lesion.  The gallbladder, pancreas, spleen, adrenal glands, and kidneys are normal.  The stomach, small bowel, appendix, and cecum are normal.  The colon and rectosigmoid colon normal.  Trace of free fluid the pelvis.  The bladder is normal.  There is a 3.8 x 3.3 cm cyst associated with the right ovary which has simple fluid attenuation.  The uterus is normal.  The left ovary is normal.  No pelvic lymphadenopathy. Review of  bone windows demonstrates no aggressive osseous lesions.  IMPRESSION:  1.  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  This could be confirmed at follow-up ultrasound in 6 to 12 weeks. 2.  Normal gallbladder and appendix. 3.  Trace free fluid the pelvis is likely physiologic.   Original Report Authenticated By: Genevive Bi, M.D.    Dg Abd Acute W/chest  12/09/2011  *RADIOLOGY REPORT*  Clinical Data: Abdominal pain, vomiting, chest pain, shortness of breath and fever.  ACUTE ABDOMEN SERIES (ABDOMEN 2 VIEW & CHEST 1 VIEW)  Comparison: Chest radiograph 07/17/2011.  Findings: Frontal view of the chest shows midline trachea and normal heart size.  Lungs are clear.  No pleural fluid.  Two views of the abdomen show stool and gas scattered in the colon. No small bowel dilatation.  Phleboliths are seen in the anatomic pelvis.  IMPRESSION: Negative.   Original Report Authenticated By: Leanna Battles, M.D.    CT abdomen pelvis shows no acute abnormality there is a cyst in the right ovary is likely a functional ovarian cyst. Discussed results with attending Dr. Ranae Palms who aggrees that further workup of this is warranted at this time.  6:45 discussed CT results with patient. Repeat abdominal exam remains nonsurgical patient has had no emesis and denies any abdominal pain at this time.  7:28 patient reexamined. Abdominal exam remains benign with no tenderness to palpation. She continued to tolerate by mouth.  Patient deferred pain medication at this time.   8:15 Patient seen and resting comfortably she has had no emesis. Abdominal exam remains benign with normal bowel  sounds, no tenderness to palpation or rebound. Continues to tolerate by mouth with no emesis  9:04 patient seen and reexamined at the bedside abdominal exam remains benign. Tolerating by mouth patient requests food I advised her to stick to a clear liquid diet.  As patient is not able to afford her Keflex and Flagyl we have consulted case management who will [facility] and we can dispense the medication to her.  Patient has had no episodes of recurrent emesis while observed today. Serial abdominal exams remained benign. She reports a full resolution to her abdominal pain. Vital signs are stable.   Pt verbalized understanding and agrees with care plan. Outpatient follow-up and return precautions given.    New Prescriptions   CEPHALEXIN (KEFLEX) 500 MG CAPSULE    Take 1 capsule (500 mg total) by mouth 4 (four) times daily.   METRONIDAZOLE (FLAGYL) 500 MG TABLET    Take 1 tablet (500 mg total) by mouth 2 (two) times daily.   OXYCODONE-ACETAMINOPHEN (PERCOCET/ROXICET) 5-325 MG PER TABLET    1 to 2 tabs PO q6hrs  PRN for pain     Wynetta Emery, PA-C 12/09/11 2147

## 2011-12-09 NOTE — ED Provider Notes (Signed)
Medical screening examination/treatment/procedure(s) were performed by non-physician practitioner and as supervising physician I was immediately available for consultation/collaboration.   Loren Racer, MD 12/09/11 [phone]

## 2011-12-09 NOTE — ED Notes (Addendum)
Per patient she has been throwing up x5 days and has not been able to eat or drink anything during that time. Pt c/o weakness. Pt states she was seen in the ED last night for the same and has not felt better. Pt is unable to take nausea medication due to vomiting.

## 2011-12-09 NOTE — ED Provider Notes (Signed)
History     CSN: [id number]  Arrival date & time 12/09/11  1023   First MD Initiated Contact with Patient 12/09/11 1050      Chief Complaint  Patient presents with  . Nausea  . Emesis    (Consider location/radiation/quality/duration/timing/severity/associated sxs/prior treatment) HPI Dawn Brock is a 26 y.o. female who presents with complaint of nausea, vomiting, abdominal pain. States symptoms stared several days ago. Nausea, vomiting is persistent, not improved with zofran at home. States has not eaten or drank anything in 4 days. States was seen two days ago, was not given a diagnosis, was started on antibiotic. States pain is worsening. States pain is all over abdomen, denies urinary or vaginal complaints. Denies fever.   Past Medical History  Diagnosis Date  . Abnormal Pap smear   . Urinary tract infection     Past Surgical History  Procedure Date  . Leep   . Leep   . Hernia repair     As an infant    Family History  Problem Relation Age of Onset  . Hypertension Mother     History  Substance Use Topics  . Smoking status: Current Every Day Smoker -- 0.5 packs/day    Types: Cigarettes  . Smokeless tobacco: Never Used  . Alcohol Use: 0.0 oz/week     Comment: Weekends    OB History    Grav Para Term Preterm Abortions TAB SAB Ect Mult Living   2 2 0 0 0 0 0 0 0 2       Review of Systems  Constitutional: Negative for fever and chills.  HENT: Negative for neck pain and neck stiffness.   Respiratory: Negative.   Cardiovascular: Negative.   Gastrointestinal: Positive for nausea, vomiting and abdominal pain.  Genitourinary: Negative for dysuria, hematuria, vaginal bleeding, vaginal discharge, vaginal pain and menstrual problem.  Musculoskeletal: Negative for gait problem.  Skin: Negative.   Neurological: Positive for weakness. Negative for headaches.    Allergies  Review of patient's allergies indicates no known allergies.  Home Medications    Current Outpatient Rx  Name  Route  Sig  Dispense  Refill  . ONDANSETRON HCL 4 MG PO TABS   Oral   Take 1 tablet (4 mg total) by mouth every 6 (six) hours.   12 tablet   0   . CEPHALEXIN 500 MG PO CAPS   Oral   Take 1 capsule (500 mg total) by mouth 4 (four) times daily.   40 capsule   0   . FAMOTIDINE 20 MG PO TABS   Oral   Take 1 tablet (20 mg total) by mouth 2 (two) times daily.   30 tablet   0   . IBUPROFEN 600 MG PO TABS   Oral   Take 1 tablet (600 mg total) by mouth 3 (three) times daily. Take with food   20 tablet   0   . METRONIDAZOLE 500 MG PO TABS   Oral   Take 1 tablet (500 mg total) by mouth 2 (two) times daily. One po bid x 7 days   14 tablet   0   . PANTOPRAZOLE SODIUM 20 MG PO TBEC   Oral   Take 1 tablet (20 mg total) by mouth daily.   30 tablet   0   . TRAMADOL HCL 50 MG PO TABS   Oral   Take 1 tablet (50 mg total) by mouth every 6 (six) hours as needed for pain.   15  tablet   0     BP 136/87  Pulse 58  Temp 98.4 F (36.9 C) (Oral)  Resp 22  SpO2 98%  LMP 11/12/2011  Physical Exam  Nursing note and vitals reviewed. Constitutional: She appears well-developed and well-nourished. No distress.  HENT:  Head: Normocephalic.  Neck: Neck supple.  Cardiovascular: Normal rate, regular rhythm, normal heart sounds and intact distal pulses.   Pulmonary/Chest: Effort normal and breath sounds normal. No respiratory distress. She has no wheezes. She has no rales.  Abdominal: Soft. Bowel sounds are normal. She exhibits no distension. There is tenderness. There is no rebound and no guarding.       Diffuse tenderness  Musculoskeletal: She exhibits no edema.  Neurological: She is alert.  Skin: Skin is warm and dry.  Psychiatric: She has a normal mood and affect.    ED Course  Procedures (including critical care time) Pt with nausea, vomiting, diffuse abdominal pain. Abdomen is soft, doubt acute abdomen. Will get labs, abd x-ray, fluids, anti  emetics.   Results for orders placed during the hospital encounter of 12/09/11  CBC WITH DIFFERENTIAL      Component Value Range   WBC 6.2  4.0 - 10.5 K/uL   RBC 4.67  3.87 - 5.11 MIL/uL   Hemoglobin 14.8  12.0 - 15.0 g/dL   HCT 16.1  09.6 - 04.5 %   MCV 94.0  78.0 - 100.0 fL   MCH 31.7  26.0 - 34.0 pg   MCHC 33.7  30.0 - 36.0 g/dL   RDW 40.9  81.1 - 91.4 %   Platelets 210  150 - 400 K/uL   Neutrophils Relative 49  43 - 77 %   Neutro Abs 3.0  1.7 - 7.7 K/uL   Lymphocytes Relative 43  12 - 46 %   Lymphs Abs 2.6  0.7 - 4.0 K/uL   Monocytes Relative 8  3 - 12 %   Monocytes Absolute 0.5  0.1 - 1.0 K/uL   Eosinophils Relative 0  0 - 5 %   Eosinophils Absolute 0.0  0.0 - 0.7 K/uL   Basophils Relative 0  0 - 1 %   Basophils Absolute 0.0  0.0 - 0.1 K/uL  COMPREHENSIVE METABOLIC PANEL      Component Value Range   Sodium 138  135 - 145 mEq/L   Potassium 3.0 (*) 3.5 - 5.1 mEq/L   Chloride 98  96 - 112 mEq/L   CO2 28  19 - 32 mEq/L   Glucose, Bld 99  70 - 99 mg/dL   BUN 7  6 - 23 mg/dL   Creatinine, Ser 7.82  0.50 - 1.10 mg/dL   Calcium 9.9  8.4 - 95.6 mg/dL   Total Protein 8.3  6.0 - 8.3 g/dL   Albumin 4.5  3.5 - 5.2 g/dL   AST 33  0 - 37 U/L   ALT 22  0 - 35 U/L   Alkaline Phosphatase 82  39 - 117 U/L   Total Bilirubin 1.4 (*) 0.3 - 1.2 mg/dL   GFR calc non Af Amer 87 (*) >90 mL/min   GFR calc Af Amer >90  >90 mL/min  LIPASE, BLOOD      Component Value Range   Lipase 31  11 - 59 U/L  URINALYSIS, ROUTINE W REFLEX MICROSCOPIC      Component Value Range   Color, Urine AMBER (*) YELLOW   APPearance CLOUDY (*) CLEAR   Specific Gravity, Urine 1.027  1.005 - 1.030   pH 7.5  5.0 - 8.0   Glucose, UA NEGATIVE  NEGATIVE mg/dL   Hgb urine dipstick NEGATIVE  NEGATIVE   Bilirubin Urine SMALL (*) NEGATIVE   Ketones, ur 40 (*) NEGATIVE mg/dL   Protein, ur 30 (*) NEGATIVE mg/dL   Urobilinogen, UA 2.0 (*) 0.0 - 1.0 mg/dL   Nitrite NEGATIVE  NEGATIVE   Leukocytes, UA NEGATIVE  NEGATIVE    POCT PREGNANCY, URINE      Component Value Range   Preg Test, Ur NEGATIVE  NEGATIVE  URINE MICROSCOPIC-ADD ON      Component Value Range   Squamous Epithelial / LPF RARE  RARE   WBC, UA 0-2  <3 WBC/hpf   RBC / HPF 0-2  <3 RBC/hpf   Bacteria, UA FEW (*) RARE   Urine-Other AMORPHOUS URATES/PHOSPHATES     Dg Abd Acute W/chest  12/09/2011  *RADIOLOGY REPORT*  Clinical Data: Abdominal pain, vomiting, chest pain, shortness of breath and fever.  ACUTE ABDOMEN SERIES (ABDOMEN 2 VIEW & CHEST 1 VIEW)  Comparison: Chest radiograph 07/17/2011.  Findings: Frontal view of the chest shows midline trachea and normal heart size.  Lungs are clear.  No pleural fluid.  Two views of the abdomen show stool and gas scattered in the colon. No small bowel dilatation.  Phleboliths are seen in the anatomic pelvis.  IMPRESSION: Negative.   Original Report Authenticated By: Leanna Battles, M.D.     2:05 PM Pt reassessed. She has now received 2L of NS, zofran 4mg  x2, phenergan 25mg  IV, morphine 4mg  IV. Pt just now feeling better. States both pain and nausea improved. Abdomen continues to be diffusely tender. Will get CT abd/pelvis for further evaluation.  Pt signed out at shift change, pt placed in CDU awaiting CT.   No diagnosis found.    MDM          Lottie Mussel, PA 12/15/11 [phone]

## 2011-12-18 NOTE — ED Provider Notes (Signed)
Medical screening examination/treatment/procedure(s) were performed by non-physician practitioner and as supervising physician I was immediately available for consultation/collaboration.  Jujhar Everett M Arsen Mangione, MD 12/18/11 0201 

## 2012-01-26 ENCOUNTER — Emergency Department (HOSPITAL_COMMUNITY): Payer: Medicaid Other

## 2012-01-26 ENCOUNTER — Encounter (HOSPITAL_COMMUNITY): Payer: Self-pay | Admitting: *Deleted

## 2012-01-26 ENCOUNTER — Inpatient Hospital Stay (HOSPITAL_COMMUNITY)
Admission: EM | Admit: 2012-01-26 | Discharge: 2012-01-30 | DRG: 690 | Disposition: A | Discharge status: Home / Self Care | Payer: Medicaid Other | Attending: Internal Medicine | Admitting: Internal Medicine

## 2012-01-26 DIAGNOSIS — R824 Acetonuria: Secondary | ICD-10-CM

## 2012-01-26 DIAGNOSIS — R112 Nausea with vomiting, unspecified: Secondary | ICD-10-CM

## 2012-01-26 DIAGNOSIS — F17200 Nicotine dependence, unspecified, uncomplicated: Secondary | ICD-10-CM | POA: Diagnosis present

## 2012-01-26 DIAGNOSIS — E876 Hypokalemia: Secondary | ICD-10-CM

## 2012-01-26 DIAGNOSIS — Z23 Encounter for immunization: Secondary | ICD-10-CM

## 2012-01-26 DIAGNOSIS — N390 Urinary tract infection, site not specified: Principal | ICD-10-CM

## 2012-01-26 LAB — URINALYSIS, ROUTINE W REFLEX MICROSCOPIC
Nitrite: NEGATIVE
Specific Gravity, Urine: 1.03 (ref 1.005–1.030)
Urobilinogen, UA: 1 mg/dL (ref 0.0–1.0)

## 2012-01-26 LAB — CBC WITH DIFFERENTIAL/PLATELET
Basophils Absolute: 0 10*3/uL (ref 0.0–0.1)
Lymphocytes Relative: 27 % (ref 12–46)
Neutro Abs: 6 10*3/uL (ref 1.7–7.7)
Platelets: 269 10*3/uL (ref 150–400)
RBC: 4.82 MIL/uL (ref 3.87–5.11)
RDW: 12.6 % (ref 11.5–15.5)
WBC: 8.9 10*3/uL (ref 4.0–10.5)

## 2012-01-26 LAB — COMPREHENSIVE METABOLIC PANEL
ALT: 8 U/L (ref 0–35)
Albumin: 4.9 g/dL (ref 3.5–5.2)
Alkaline Phosphatase: 90 U/L (ref 39–117)
BUN: 14 mg/dL (ref 6–23)
Potassium: 2.6 mEq/L — CL (ref 3.5–5.1)
Sodium: 141 mEq/L (ref 135–145)
Total Protein: 9 g/dL — ABNORMAL HIGH (ref 6.0–8.3)

## 2012-01-26 LAB — URINE MICROSCOPIC-ADD ON

## 2012-01-26 LAB — HCG, SERUM, QUALITATIVE: Preg, Serum: NEGATIVE

## 2012-01-26 LAB — LIPASE, BLOOD: Lipase: 22 U/L (ref 11–59)

## 2012-01-26 MED ORDER — SODIUM CHLORIDE 0.9 % IV SOLN
INTRAVENOUS | Status: AC
  Administered 2012-01-27 (×2): via INTRAVENOUS

## 2012-01-26 MED ORDER — SODIUM CHLORIDE 0.9 % IV SOLN
INTRAVENOUS | Status: AC
  Administered 2012-01-26: 16:00:00 via INTRAVENOUS

## 2012-01-26 MED ORDER — ZOLPIDEM TARTRATE 5 MG PO TABS
5.0000 mg | ORAL_TABLET | Freq: Once | ORAL | Status: AC
  Administered 2012-01-26: 5 mg via ORAL
  Filled 2012-01-26: qty 1

## 2012-01-26 MED ORDER — CIPROFLOXACIN IN D5W 400 MG/200ML IV SOLN
400.0000 mg | Freq: Two times a day (BID) | INTRAVENOUS | Status: DC
  Administered 2012-01-26 – 2012-01-28 (×4): 400 mg via INTRAVENOUS
  Filled 2012-01-26 (×6): qty 200

## 2012-01-26 MED ORDER — POTASSIUM CHLORIDE 10 MEQ/100ML IV SOLN
10.0000 meq | Freq: Once | INTRAVENOUS | Status: AC
  Administered 2012-01-26: 10 meq via INTRAVENOUS
  Filled 2012-01-26: qty 100

## 2012-01-26 MED ORDER — HYDROMORPHONE HCL PF 1 MG/ML IJ SOLN: 1.0000 mg | INTRAMUSCULAR | Status: AC | PRN

## 2012-01-26 MED ORDER — ACETAMINOPHEN 650 MG RE SUPP: 650.0000 mg | Freq: Four times a day (QID) | RECTAL | Status: DC | PRN

## 2012-01-26 MED ORDER — ONDANSETRON HCL 4 MG/2ML IJ SOLN
4.0000 mg | Freq: Once | INTRAMUSCULAR | Status: AC
  Administered 2012-01-26: 4 mg via INTRAVENOUS
  Filled 2012-01-26: qty 2

## 2012-01-26 MED ORDER — SODIUM CHLORIDE 0.9 % IV BOLUS (SEPSIS)
1000.0000 mL | Freq: Once | INTRAVENOUS | Status: AC
  Administered 2012-01-26: 1000 mL via INTRAVENOUS

## 2012-01-26 MED ORDER — SODIUM CHLORIDE 0.9 % IV BOLUS (SEPSIS)
500.0000 mL | Freq: Once | INTRAVENOUS | Status: AC
  Administered 2012-01-26: 500 mL via INTRAVENOUS

## 2012-01-26 MED ORDER — ACETAMINOPHEN 325 MG PO TABS
650.0000 mg | ORAL_TABLET | Freq: Four times a day (QID) | ORAL | Status: DC | PRN
  Administered 2012-01-27: 650 mg via ORAL
  Filled 2012-01-26: qty 2

## 2012-01-26 MED ORDER — ONDANSETRON HCL 4 MG PO TABS
4.0000 mg | ORAL_TABLET | Freq: Four times a day (QID) | ORAL | Status: DC | PRN
  Administered 2012-01-28 – 2012-01-29 (×2): 4 mg via ORAL
  Filled 2012-01-26 (×2): qty 1

## 2012-01-26 MED ORDER — FENTANYL CITRATE 0.05 MG/ML IJ SOLN
100.0000 ug | Freq: Once | INTRAMUSCULAR | Status: AC
  Administered 2012-01-26: 100 ug via INTRAVENOUS
  Filled 2012-01-26: qty 2

## 2012-01-26 MED ORDER — ALUM & MAG HYDROXIDE-SIMETH 200-200-20 MG/5ML PO SUSP: 30.0000 mL | Freq: Four times a day (QID) | ORAL | Status: DC | PRN

## 2012-01-26 MED ORDER — PANTOPRAZOLE SODIUM 40 MG PO TBEC
40.0000 mg | DELAYED_RELEASE_TABLET | Freq: Two times a day (BID) | ORAL | Status: DC
  Administered 2012-01-26 – 2012-01-29 (×6): 40 mg via ORAL
  Filled 2012-01-26 (×4): qty 1

## 2012-01-26 MED ORDER — DEXTROSE 5 % IV SOLN
1.0000 g | INTRAVENOUS | Status: DC
  Administered 2012-01-26: 1 g via INTRAVENOUS
  Filled 2012-01-26 (×2): qty 10

## 2012-01-26 MED ORDER — ONDANSETRON 4 MG PO TBDP
4.0000 mg | ORAL_TABLET | Freq: Once | ORAL | Status: AC
  Administered 2012-01-26: 4 mg via ORAL

## 2012-01-26 MED ORDER — HEPARIN SODIUM (PORCINE) 5000 UNIT/ML IJ SOLN
5000.0000 [IU] | Freq: Three times a day (TID) | INTRAMUSCULAR | Status: DC
  Administered 2012-01-26 – 2012-01-29 (×7): 5000 [IU] via SUBCUTANEOUS
  Filled 2012-01-26 (×14): qty 1

## 2012-01-26 MED ORDER — ONDANSETRON 4 MG PO TBDP
ORAL_TABLET | ORAL | Status: AC
  Filled 2012-01-26: qty 1

## 2012-01-26 MED ORDER — ONDANSETRON HCL 4 MG/2ML IJ SOLN: 4.0000 mg | Freq: Three times a day (TID) | INTRAMUSCULAR | Status: DC | PRN

## 2012-01-26 MED ORDER — ONDANSETRON HCL 4 MG/2ML IJ SOLN
4.0000 mg | Freq: Four times a day (QID) | INTRAMUSCULAR | Status: DC | PRN
  Administered 2012-01-26 – 2012-01-27 (×3): 4 mg via INTRAVENOUS
  Filled 2012-01-26 (×2): qty 2

## 2012-01-26 MED ORDER — MAGNESIUM OXIDE 400 (241.3 MG) MG PO TABS
400.0000 mg | ORAL_TABLET | Freq: Two times a day (BID) | ORAL | Status: AC
  Administered 2012-01-26 – 2012-01-27 (×2): 400 mg via ORAL
  Filled 2012-01-26 (×2): qty 1

## 2012-01-26 NOTE — H&P (Signed)
[facility] History and Physical  Dawn Brock ZOX:[id number] DOB: 1985-04-01 DOA: 01/26/2012  Referring physician: Dr. Radford Pax PCP: Default, Provider, MD  Specialists: none  Chief Complaint: nausea vomiting  HPI: Dawn Brock is a 27 y.o. female  With past medical history of UTI she has not finished her antibiotics, Comes in for severe nausea and vomiting 1 day prior to admission progressively getting worse. She also feels weak and fatigued. She is complaining of some incontinence with sneezing or coughing. She relates no abdominal pain.  She relates some chills but no fevers. She has missed several doses of her antibiotics.  Review of Systems: The patient denies anorexia, fever, weight loss,, vision loss, decreased hearing, hoarseness, chest pain, syncope, dyspnea on exertion, peripheral edema, balance deficits, hemoptysis, abdominal pain, melena, hematochezia, severe indigestion/heartburn, hematuria, incontinence, genital sores, muscle weakness, suspicious skin lesions, transient blindness, difficulty walking, depression, unusual weight change, abnormal bleeding, enlarged lymph nodes, angioedema, and breast masses.    Past Medical History  Diagnosis Date  . Abnormal Pap smear   . Urinary tract infection    Past Surgical History  Procedure Date  . Leep   . Leep   . Hernia repair     As an infant   Social History:  reports that she has been smoking Cigarettes.  She has a 14 pack-year smoking history. She has never used smokeless tobacco. She reports that she drinks about 7.2 ounces of alcohol per week. She reports that she does not use illicit drugs. As at home by herself  No Known Allergies  Family History  Problem Relation Age of Onset  . Hypertension Mother   . Cirrhosis Father    Prior to Admission medications   Medication Sig Start Date End Date Taking? Authorizing Provider  famotidine (PEPCID) 20 MG tablet Take 1 tablet (20 mg total) by mouth 2  (two) times daily. 12/06/11   Sunnie Nielsen, MD  pantoprazole (PROTONIX) 20 MG tablet Take 1 tablet (20 mg total) by mouth daily. 12/06/11   Sunnie Nielsen, MD  traMADol (ULTRAM) 50 MG tablet Take 1 tablet (50 mg total) by mouth every 6 (six) hours as needed for pain. 11/25/11   Sharin Grave, MD   Physical Exam: Filed Vitals:   01/26/12 1215 01/26/12 1250 01/26/12 1300 01/26/12 1345  BP: 123/91 108/65 143/81 155/79  Pulse: 46 48 51 51  Temp:      TempSrc:      Resp: 14  14 16   SpO2: 100% 100% 98% 100%     General:  Awake alert and oriented x3  Eyes: Anicteric  ENT: Dry mucous membranes  Neck: No JVD  Cardiovascular: Regular at and rhythm with positive S1 and S2 no murmurs rubs gallops  Respiratory: Good air movement and clear to auscultation  Abdomen: Positive bowel sounds suprapubic tenderness nondistended and soft  Skin: No rashes or ulcerations  Musculoskeletal: Intact  Psychiatric: Appropriate  Neurologic: Nonfocal  Labs on Admission:  Basic Metabolic Panel:  Lab 01/26/12 8119  NA 141  K 2.6*  CL 97  CO2 27  GLUCOSE 123*  BUN 14  CREATININE 0.86  CALCIUM 10.4  MG --  PHOS --   Liver Function Tests:  Lab 01/26/12 1010  AST 18  ALT 8  ALKPHOS 90  BILITOT 0.9  PROT 9.0*  ALBUMIN 4.9    Lab 01/26/12 1010  LIPASE 22  AMYLASE --   No results found for this basename: AMMONIA:5 in the last 168 hours CBC:  Lab 01/26/12 1010  WBC 8.9  NEUTROABS 6.0  HGB 15.9*  HCT 45.8  MCV 95.0  PLT 269   Cardiac Enzymes: No results found for this basename: CKTOTAL:5,CKMB:5,CKMBINDEX:5,TROPONINI:5 in the last 168 hours  BNP (last 3 results) No results found for this basename: PROBNP:3 in the last 8760 hours CBG: No results found for this basename: GLUCAP:5 in the last 168 hours  Radiological Exams on Admission: Dg Abd Acute W/chest  01/26/2012  *RADIOLOGY REPORT*  Clinical Data: Lower abdominal pain, nausea, vomiting.  ACUTE ABDOMEN SERIES (ABDOMEN 2  VIEW & CHEST 1 VIEW)  Comparison: 12/09/2011  Findings: Calcified phleboliths in the anatomic pelvis. The bowel gas pattern is normal.  There is no evidence of free intraperitoneal air.  No suspicious radio-opaque calculi or other significant radiographic abnormality is seen. Heart size and mediastinal contours are within normal limits.  Both lungs are clear.  IMPRESSION: No acute findings.   Original Report Authenticated By: Charlett Nose, M.D.     EKG: Sinus bradycardia  Assessment/Plan Hypokalemia: -Most likely secondary to nausea vomiting and decreased intravascular volume. We'll go ahead and start her on KCl by mouth check a magnesium level and replete it. We'll check a basic metabolic panel in the morning. Also start her on aggressive fluid hydration.  Nausea and vomiting: 2/2 UTI: - She was started on antibiotic which she  has not finished taking it,  probably why her UA is inconclusive. -Most likely secondary to her urinary tract infection, she does have suprapubic tenderness along with suprapubic tenderness, I doubt her urine culture will grow anything as she has been partially treated. She has not completed her antibiotics treatment and she has missed several doses. - Go ahead and start her on  Cipro, and reevaluate in the morning once her suprapubic pain is doing. She has been having chills here in the emergency room. She has not had a fever or leukocytosis  Code Status: full Family Communication: none Disposition Plan: home  Time spent: [id number] Rosine Beat [facility] Pager [phone]  If 7PM-7AM, please contact night-coverage [email or web address] Password [email or web address] 01/26/2012, 2:18 PM

## 2012-01-26 NOTE — Progress Notes (Signed)
Pt has had 2 cartons of orange juice and vomited after both of them.  MD notified and new order received.  Will continue to monitor.  Hector Shade [place]

## 2012-01-26 NOTE — ED Notes (Signed)
Pt c/o n/v X 3 days. Pt reports she hasn't been able to keep any food or fluids down. Pt also c/o abd pain across lower part of abd, rates pain at 10/10. Pt reports she feels very dehydrated.

## 2012-01-26 NOTE — ED Notes (Signed)
Attempted to give report, RN unavailable at this time.

## 2012-01-26 NOTE — ED Notes (Signed)
Pt requested and given apple juice. 

## 2012-01-26 NOTE — ED Provider Notes (Signed)
History     CSN: [id number]  Arrival date & time 01/26/12  7829   First MD Initiated Contact with Patient 01/26/12 1116      Chief Complaint  Patient presents with  . Emesis  . Abdominal Pain     HPI Pt is here with vomiting since Sunday and now with abdominal pain and denies any diarrhea.  Numerous ER visits for same complaint in the last 6 months.  Denies alcohol ingestion in the last week to 2 weeks  Past Medical History  Diagnosis Date  . Abnormal Pap smear   . Urinary tract infection     Past Surgical History  Procedure Date  . Leep   . Leep   . Hernia repair     As an infant    Family History  Problem Relation Age of Onset  . Hypertension Mother     History  Substance Use Topics  . Smoking status: Current Every Day Smoker -- 0.5 packs/day    Types: Cigarettes  . Smokeless tobacco: Never Used  . Alcohol Use: 0.0 oz/week     Comment: Weekends    OB History    Grav Para Term Preterm Abortions TAB SAB Ect Mult Living   2 2 0 0 0 0 0 0 0 2       Review of Systems All other systems reviewed and are negative Allergies  Review of patient's allergies indicates no known allergies.  Home Medications   Current Outpatient Rx  Name  Route  Sig  Dispense  Refill  . CEPHALEXIN 500 MG PO CAPS   Oral   Take 1 capsule (500 mg total) by mouth 4 (four) times daily.   40 capsule   0   . CEPHALEXIN 500 MG PO CAPS   Oral   Take 1 capsule (500 mg total) by mouth 4 (four) times daily.   40 capsule   0   . FAMOTIDINE 20 MG PO TABS   Oral   Take 1 tablet (20 mg total) by mouth 2 (two) times daily.   30 tablet   0   . IBUPROFEN 600 MG PO TABS   Oral   Take 1 tablet (600 mg total) by mouth 3 (three) times daily. Take with food   20 tablet   0   . METRONIDAZOLE 500 MG PO TABS   Oral   Take 1 tablet (500 mg total) by mouth 2 (two) times daily. One po bid x 7 days   14 tablet   0   . METRONIDAZOLE 500 MG PO TABS   Oral   Take 1 tablet (500 mg total) by  mouth 2 (two) times daily.   14 tablet   0   . ONDANSETRON HCL 4 MG PO TABS   Oral   Take 1 tablet (4 mg total) by mouth every 6 (six) hours.   12 tablet   0   . OXYCODONE-ACETAMINOPHEN 5-325 MG PO TABS      1 to 2 tabs PO q6hrs  PRN for pain   10 tablet   0   . PANTOPRAZOLE SODIUM 20 MG PO TBEC   Oral   Take 1 tablet (20 mg total) by mouth daily.   30 tablet   0   . TRAMADOL HCL 50 MG PO TABS   Oral   Take 1 tablet (50 mg total) by mouth every 6 (six) hours as needed for pain.   15 tablet   0  BP 108/65  Pulse 48  Temp 97.8 F (36.6 C) (Oral)  Resp 14  SpO2 100%  LMP 01/06/2012  Physical Exam  Nursing note and vitals reviewed. Constitutional: She is oriented to person, place, and time. She appears well-developed and well-nourished. She appears distressed (Appears very nauseated with active vomiting).  HENT:  Head: Normocephalic and atraumatic.  Eyes: Pupils are equal, round, and reactive to light.  Neck: Normal range of motion.  Cardiovascular: Intact distal pulses.  Bradycardia present.   Pulmonary/Chest: No respiratory distress. She has no wheezes. She has no rales.  Abdominal: Normal appearance. She exhibits no distension and no mass. There is tenderness (Diffuse). There is no rebound and no guarding.  Musculoskeletal: Normal range of motion.  Neurological: She is alert and oriented to person, place, and time. No cranial nerve deficit.  Skin: Skin is warm and dry. No rash noted.  Psychiatric: She has a normal mood and affect. Her behavior is normal.    ED Course  Procedures (including critical care time)  Date: 01/26/2012  Rate: 50  Rhythm: Sinus bradycardia  QRS Axis: normal  Intervals: normal  ST/T Wave abnormalities: normal  Conduction Disutrbances: none  Narrative Interpretation: Abnormal EKG   Medications  ondansetron (ZOFRAN-ODT) disintegrating tablet 4 mg (4 mg Oral Given 01/26/12 1011)  fentaNYL (SUBLIMAZE) injection 100 mcg (100 mcg  Intravenous Given 01/26/12 1154)  ondansetron (ZOFRAN) injection 4 mg (4 mg Intravenous Given 01/26/12 1159)  sodium chloride 0.9 % bolus 1,000 mL (1000 mL Intravenous New Bag/Given 01/26/12 1133)  potassium chloride 10 mEq in 100 mL IVPB (10 mEq Intravenous New Bag/Given 01/26/12 1157)      Labs Reviewed  CBC WITH DIFFERENTIAL - Abnormal; Notable for the following:    Hemoglobin 15.9 (*)     All other components within normal limits  URINALYSIS, ROUTINE W REFLEX MICROSCOPIC - Abnormal; Notable for the following:    APPearance CLOUDY (*)     Hgb urine dipstick SMALL (*)     Bilirubin Urine SMALL (*)     Ketones, ur 40 (*)     Protein, ur 100 (*)     Leukocytes, UA LARGE (*)     All other components within normal limits  COMPREHENSIVE METABOLIC PANEL - Abnormal; Notable for the following:    Potassium 2.6 (*)     Glucose, Bld 123 (*)     Total Protein 9.0 (*)     All other components within normal limits  URINE MICROSCOPIC-ADD ON - Abnormal; Notable for the following:    Squamous Epithelial / LPF MANY (*)     Bacteria, UA FEW (*)     All other components within normal limits  LIPASE, BLOOD  HCG, SERUM, QUALITATIVE  POCT PREGNANCY, URINE  URINE CULTURE   Dg Abd Acute W/chest  01/26/2012  *RADIOLOGY REPORT*  Clinical Data: Lower abdominal pain, nausea, vomiting.  ACUTE ABDOMEN SERIES (ABDOMEN 2 VIEW & CHEST 1 VIEW)  Comparison: 12/09/2011  Findings: Calcified phleboliths in the anatomic pelvis. The bowel gas pattern is normal.  There is no evidence of free intraperitoneal air.  No suspicious radio-opaque calculi or other significant radiographic abnormality is seen. Heart size and mediastinal contours are within normal limits.  Both lungs are clear.  IMPRESSION: No acute findings.   Original Report Authenticated By: Charlett Nose, M.D.      1. Hypokalemia   2. Nausea and vomiting   3. Ketonuria       MDM  Nelia Shi, MD 01/26/12 1357

## 2012-01-26 NOTE — Progress Notes (Signed)
MD notified of pt's arrival to floor.  Dawn Brock  

## 2012-01-26 NOTE — ED Notes (Signed)
Pt unable to urinate in triage  

## 2012-01-26 NOTE — ED Notes (Signed)
Pt is here with vomiting since Sunday and now with abdominal pain and denies any diarrhea.  LMP; 01/06/12

## 2012-01-26 NOTE — ED Notes (Signed)
Pt return from radiology and placed back on monitor.

## 2012-01-26 NOTE — ED Notes (Signed)
Pt given ginger ale.

## 2012-01-27 LAB — URINE CULTURE

## 2012-01-27 LAB — COMPREHENSIVE METABOLIC PANEL
Alkaline Phosphatase: 66 U/L (ref 39–117)
BUN: 9 mg/dL (ref 6–23)
CO2: 23 mEq/L (ref 19–32)
Calcium: 8.7 mg/dL (ref 8.4–10.5)
Chloride: 105 mEq/L (ref 96–112)
Creatinine, Ser: 0.75 mg/dL (ref 0.50–1.10)
GFR calc Af Amer: >90 mL/min (ref >90)

## 2012-01-27 MED ORDER — WHITE PETROLATUM GEL
Status: AC
  Administered 2012-01-27: 0.2
  Filled 2012-01-27: qty 5

## 2012-01-27 MED ORDER — POTASSIUM CHLORIDE CRYS ER 20 MEQ PO TBCR
40.0000 meq | EXTENDED_RELEASE_TABLET | Freq: Once | ORAL | Status: AC
  Administered 2012-01-27: 40 meq via ORAL
  Filled 2012-01-27: qty 2

## 2012-01-27 MED ORDER — POTASSIUM CHLORIDE 20 MEQ PO PACK
40.0000 meq | PACK | Freq: Once | ORAL | Status: DC
  Filled 2012-01-27: qty 2

## 2012-01-27 MED ORDER — ZOLPIDEM TARTRATE 5 MG PO TABS
5.0000 mg | ORAL_TABLET | Freq: Every evening | ORAL | Status: AC | PRN
  Administered 2012-01-28: 5 mg via ORAL
  Filled 2012-01-27 (×2): qty 1

## 2012-01-27 NOTE — Progress Notes (Signed)
UR completed 

## 2012-01-27 NOTE — Progress Notes (Signed)
[facility] PROGRESS NOTE  Dawn Brock WUJ:[id number] DOB: 07-12-1985 DOA: 01/26/2012 PCP: Default, Provider, MD  Assessment/Plan: Hypokalemia:  -Most likely secondary to nausea vomiting and decreased intravascular volume.  - will replete k and recheck in the morning  Nausea and vomiting: 2/2 UTI:  - She was started on antibiotic which she has not finished taking it, probably why her UA is inconclusive.  - Most likely secondary to her urinary tract infection, she does have suprapubic tenderness along with suprapubic tenderness, I doubt her urine culture will grow anything as she has been partially treated. She has not completed her antibiotics treatment and she has missed several doses.  - continue Ciprofloxacin - still not tolerating po intake with nausea/vomiting, will continue to monitor   Code Status: Full Family Communication: none (indicate person spoken with, relationship, and if by phone, the number) Disposition Plan: home once able to tolerate po   Consultants:  none  Procedures:  none  Antibiotics:  Ciprofloxacin (1.14.14 -   HPI/Subjective: Feeling well this morning, eager to try po intake.   Objective: Filed Vitals:   01/26/12 1603 01/26/12 1620 01/26/12 1923 01/26/12 2200  BP: 138/65 143/100 119/71 119/79  Pulse: 59 61 61 59  Temp: 99 F (37.2 C)   98.2 F (36.8 C)  TempSrc: Oral   Oral  Resp: 18 19  17   SpO2: 100%   100%    Intake/Output Summary (Last 24 hours) at 01/27/12 0515 Last data filed at 01/26/12 2200  Gross per 24 hour  Intake    750 ml  Output      0 ml  Net    750 ml   There were no vitals filed for this visit.  Exam:   General:  NAD  Cardiovascular: RRR, no MRG  Respiratory: CTA biL, good air movement  Abdomen: soft, NTTP, no suprapubic tenderness this morning  Data Reviewed: Basic Metabolic Panel:  Lab 01/26/12 9562  NA 141  K 2.6*  CL 97  CO2 27  GLUCOSE 123*  BUN 14  CREATININE 0.86  CALCIUM  10.4  MG --  PHOS --   Liver Function Tests:  Lab 01/26/12 1010  AST 18  ALT 8  ALKPHOS 90  BILITOT 0.9  PROT 9.0*  ALBUMIN 4.9    Lab 01/26/12 1010  LIPASE 22  AMYLASE --   No results found for this basename: AMMONIA:5 in the last 168 hours CBC:  Lab 01/26/12 1010  WBC 8.9  NEUTROABS 6.0  HGB 15.9*  HCT 45.8  MCV 95.0  PLT 269   Cardiac Enzymes: No results found for this basename: CKTOTAL:5,CKMB:5,CKMBINDEX:5,TROPONINI:5 in the last 168 hours BNP (last 3 results) No results found for this basename: PROBNP:3 in the last 8760 hours CBG: No results found for this basename: GLUCAP:5 in the last 168 hours  No results found for this or any previous visit (from the past 240 hour(s)).   Studies: Dg Abd Acute W/chest  01/26/2012  *RADIOLOGY REPORT*  Clinical Data: Lower abdominal pain, nausea, vomiting.  ACUTE ABDOMEN SERIES (ABDOMEN 2 VIEW & CHEST 1 VIEW)  Comparison: 12/09/2011  Findings: Calcified phleboliths in the anatomic pelvis. The bowel gas pattern is normal.  There is no evidence of free intraperitoneal air.  No suspicious radio-opaque calculi or other significant radiographic abnormality is seen. Heart size and mediastinal contours are within normal limits.  Both lungs are clear.  IMPRESSION: No acute findings.   Original Report Authenticated By: Charlett Nose, M.D.  Scheduled Meds:   . cefTRIAXone (ROCEPHIN)  IV  1 g Intravenous Q24H  . ciprofloxacin  400 mg Intravenous Q12H  . heparin  5,000 Units Subcutaneous Q8H  . magnesium oxide  400 mg Oral BID  . pantoprazole  40 mg Oral BID AC   Continuous Infusions:   . sodium chloride 125 mL/hr at 01/27/12 0144    Principal Problem:  *Hypokalemia Active Problems:  UTI (lower urinary tract infection)  Nausea and vomiting in adult    Dawn Brock Otelia Sergeant  [facility] Pager [phone]. If 7PM-7AM, please contact night-coverage at [email or web address], password [facility] 01/27/2012, 5:15 AM  LOS: 1 day

## 2012-01-28 LAB — BASIC METABOLIC PANEL
BUN: 4 mg/dL — ABNORMAL LOW (ref 6–23)
CO2: 26 mEq/L (ref 19–32)
Calcium: 8.9 mg/dL (ref 8.4–10.5)
Creatinine, Ser: 0.66 mg/dL (ref 0.50–1.10)
Glucose, Bld: 98 mg/dL (ref 70–99)

## 2012-01-28 MED ORDER — PANTOPRAZOLE SODIUM 40 MG PO TBEC: 40.0000 mg | DELAYED_RELEASE_TABLET | Freq: Every day | ORAL | Status: DC

## 2012-01-28 MED ORDER — HYDROCODONE-ACETAMINOPHEN 5-325 MG PO TABS
1.0000 | ORAL_TABLET | Freq: Once | ORAL | Status: AC
  Administered 2012-01-28: 1 via ORAL
  Filled 2012-01-28: qty 1

## 2012-01-28 MED ORDER — POTASSIUM CHLORIDE 20 MEQ PO PACK
40.0000 meq | PACK | Freq: Once | ORAL | Status: DC
Start: 2012-01-28 — End: 2012-01-28
  Filled 2012-01-28: qty 2

## 2012-01-28 MED ORDER — ALUM & MAG HYDROXIDE-SIMETH 200-200-20 MG/5ML PO SUSP: 30.0000 mL | Freq: Four times a day (QID) | ORAL | Status: DC | PRN

## 2012-01-28 MED ORDER — ONDANSETRON HCL 4 MG PO TABS: 4.0000 mg | ORAL_TABLET | Freq: Four times a day (QID) | ORAL | Status: DC | PRN

## 2012-01-28 MED ORDER — POTASSIUM CHLORIDE ER 10 MEQ PO TBCR: 10.0000 meq | EXTENDED_RELEASE_TABLET | Freq: Every day | ORAL | Status: DC

## 2012-01-28 MED ORDER — PROMETHAZINE HCL 25 MG/ML IJ SOLN
12.5000 mg | Freq: Four times a day (QID) | INTRAMUSCULAR | Status: AC | PRN
  Administered 2012-01-28 – 2012-01-29 (×2): 12.5 mg via INTRAVENOUS
  Filled 2012-01-28 (×2): qty 1

## 2012-01-28 MED ORDER — POTASSIUM CHLORIDE ER 10 MEQ PO TBCR: 10.0000 meq | EXTENDED_RELEASE_TABLET | Freq: Every morning | ORAL | Status: DC

## 2012-01-28 MED ORDER — GATORADE (BH)
240.0000 mL | Freq: Two times a day (BID) | ORAL | Status: DC
  Administered 2012-01-28 – 2012-01-30 (×4): 240 mL via ORAL
  Filled 2012-01-28: qty 480

## 2012-01-28 MED ORDER — ENSURE COMPLETE PO LIQD
237.0000 mL | ORAL | Status: DC
  Administered 2012-01-28 – 2012-01-29 (×2): 237 mL via ORAL

## 2012-01-28 MED ORDER — POTASSIUM CHLORIDE CRYS ER 20 MEQ PO TBCR
40.0000 meq | EXTENDED_RELEASE_TABLET | Freq: Once | ORAL | Status: AC
  Administered 2012-01-28: 40 meq via ORAL
  Filled 2012-01-28: qty 2

## 2012-01-28 MED ORDER — BOOST / RESOURCE BREEZE PO LIQD
1.0000 | ORAL | Status: DC
  Administered 2012-01-28 – 2012-01-29 (×2): 1 via ORAL

## 2012-01-28 NOTE — Progress Notes (Signed)
[facility] PROGRESS NOTE  Dawn Brock WUJ:[id number] DOB: 07-07-1985 DOA: 01/26/2012 PCP: Default, Provider, MD  Assessment/Plan:  Hypokalemia:  -Most likely secondary to nausea vomiting and decreased intravascular volume. Needs repletion today as well. I suspect that her K will normalize once she will be able to tolerate po intake.  - she will need follow up with her PCP on Monday to assess intake and perhaps a BMP recheck.   Nausea and vomiting: 2/2 UTI:  - She was started on antibiotic which she has not finished taking it, probably why her UA is inconclusive. She has already received a total of 5 days Abx including 2 days at home and 3 days here which is enough. She will therefore not need Abx on discharge.  - she has no abdominal pain today and no suprapubic tenderness. She will start eating this morning and if she tolerates she will go home later today.   Code Status: Full Family Communication: none (indicate person spoken with, relationship, and if by phone, the number) Disposition Plan: home once able to tolerate po  Consultants:  none  Procedures:  none  Antibiotics:  Ciprofloxacin (1.14.14 - 1.16.14)  HPI/Subjective: Feeling well this morning, eager to try po intake.   Objective: Filed Vitals:   01/27/12 1450 01/27/12 2102 01/28/12 0624 01/28/12 0816  BP: 129/80 109/69 105/66   Pulse: 55 66 56   Temp: 99 F (37.2 C) 98.7 F (37.1 C) 98.6 F (37 C)   TempSrc: Oral Oral Oral   Resp: 20 16 17    Height:   5\' 7"  (1.702 m)   Weight:   77.2 kg (170 lb 3.1 oz) 72.031 kg (158 lb 12.8 oz)  SpO2: 100% 100% 100%     Intake/Output Summary (Last 24 hours) at 01/28/12 0954 Last data filed at 01/28/12 0624  Gross per 24 hour  Intake 1141.67 ml  Output   1350 ml  Net -208.33 ml   Filed Weights   01/27/12 0630 01/28/12 0624 01/28/12 0816  Weight: 77.111 kg (170 lb) 77.2 kg (170 lb 3.1 oz) 72.031 kg (158 lb 12.8 oz)    Exam:   General:   NAD  Cardiovascular: RRR, no MRG  Respiratory: CTA biL, good air movement  Abdomen: soft, NTTP, no suprapubic tenderness. No CVA tenderness.   Data Reviewed: Basic Metabolic Panel:  Lab 01/28/12 9562 01/27/12 0448 01/26/12 1010  NA 135 141 141  K 3.0* 3.0* 2.6*  CL 100 105 97  CO2 26 23 27   GLUCOSE 98 99 123*  BUN 4* 9 14  CREATININE 0.66 0.75 0.86  CALCIUM 8.9 8.7 10.4  MG -- -- --  PHOS -- -- --   Liver Function Tests:  Lab 01/27/12 0448 01/26/12 1010  AST 13 18  ALT 5 8  ALKPHOS 66 90  BILITOT 1.0 0.9  PROT 6.5 9.0*  ALBUMIN 3.6 4.9    Lab 01/26/12 1010  LIPASE 22  AMYLASE --   CBC:  Lab 01/26/12 1010  WBC 8.9  NEUTROABS 6.0  HGB 15.9*  HCT 45.8  MCV 95.0  PLT 269    Recent Results (from the past 240 hour(s))  URINE CULTURE     Status: Normal   Collection Time   01/26/12 12:02 PM      Component Value Range Status Comment   Specimen Description URINE, RANDOM   Final    Special Requests NONE   Final    Culture  Setup Time 01/26/2012 13:37   Final  Colony Count 75,000 COLONIES/ML   Final    Culture     Final    Value: Multiple bacterial morphotypes present, none predominant. Suggest appropriate recollection if clinically indicated.   Report Status 01/27/2012 FINAL   Final      Studies: Dg Abd Acute W/chest  01/26/2012  *RADIOLOGY REPORT*  Clinical Data: Lower abdominal pain, nausea, vomiting.  ACUTE ABDOMEN SERIES (ABDOMEN 2 VIEW & CHEST 1 VIEW)  Comparison: 12/09/2011  Findings: Calcified phleboliths in the anatomic pelvis. The bowel gas pattern is normal.  There is no evidence of free intraperitoneal air.  No suspicious radio-opaque calculi or other significant radiographic abnormality is seen. Heart size and mediastinal contours are within normal limits.  Both lungs are clear.  IMPRESSION: No acute findings.   Original Report Authenticated By: Charlett Nose, M.D.     Scheduled Meds:    . ciprofloxacin  400 mg Intravenous Q12H  . gatorade (BH)   240 mL Oral BID  . heparin  5,000 Units Subcutaneous Q8H  . pantoprazole  40 mg Oral BID AC  . potassium chloride  40 mEq Oral Once   Continuous Infusions:   Principal Problem:  *Hypokalemia Active Problems:  UTI (lower urinary tract infection)  Nausea and vomiting in adult  [facility], Stevphen Meuse  [facility] Pager [phone]. If 7PM-7AM, please contact night-coverage at [email or web address], password [facility] 01/28/2012, 9:54 AM  LOS: 2 days

## 2012-01-28 NOTE — Progress Notes (Signed)
INITIAL NUTRITION ASSESSMENT  DOCUMENTATION CODES Per approved criteria  -Not Applicable   INTERVENTION: 1. Ensure Complete po daily, each supplement provides 350 kcal and 13 grams of protein. 2. Resource Breeze po daily, each supplement provides 250 kcal and 9 grams of protein. 3. Encouraged bland foods; recommend diet change to [facility] Diet 4. RD to continue to follow nutrition care plan  NUTRITION DIAGNOSIS: Inadequate oral intake related to GI distress as evidenced by pt report of limited tolerance of foods.   Goal: Pt to meet >/= 90% of their estimated nutrition needs.  Monitor:  weight trends, lab trends, I/O's, PO intake, supplement tolerance  Reason for Assessment: Malnutrition Screening  27 y.o. female  Admitting Dx: Hypokalemia  ASSESSMENT: Admitted with hypokalemia 2/2 n/v. She was started on UTI and did not complete her course of abx, apparently has missed several doses.  Advanced to Regular diet this morning however, she reports that she was only able to eat a few bites of eggs and bacon this morning prior to vomiting. Did not attempt to eat lunch. Currently drinking Gatorade, however she has thrown some of that up as well. Pt stated that she was apprehensive about eating dinner. Reviewed choices and placed dinner order for pt. She was agreeable to trying Ensure Complete and Resource Breeze po daily to help with her poor po intake.  Pt with 5% wt loss x two months. This is not significant.  Height: Ht Readings from Last 1 Encounters:  01/28/12 5\' 7"  (1.702 m)    Weight: Wt Readings from Last 1 Encounters:  01/28/12 158 lb 12.8 oz (72.031 kg)    Ideal Body Weight: 135 lb  % Ideal Body Weight: 117%  Wt Readings from Last 10 Encounters:  01/28/12 158 lb 12.8 oz (72.031 kg)  12/05/11 166 lb (75.297 kg)  11/24/11 166 lb (75.297 kg)  09/07/11 163 lb (73.936 kg)  06/14/11 155 lb (70.308 kg)  08/09/10 163 lb 6 oz (74.106 kg)    Usual Body Weight: 166  lb  % Usual Body Weight: 95%  BMI:  Body mass index is 24.87 kg/(m^2). Weight is WNL.  Estimated Nutritional Needs: Kcal: 1600 - 1750 kcal Protein: 70 - 80 grams Fluid: 1.6 - 1.8 liters daily  Skin: intact  Diet Order: General  EDUCATION NEEDS: -No education needs identified at this time   Intake/Output Summary (Last 24 hours) at 01/28/12 1458 Last data filed at 01/28/12 1300  Gross per 24 hour  Intake    840 ml  Output   1750 ml  Net   -910 ml    Last BM: PTA  Labs:   Lab 01/28/12 0625 01/27/12 0448 01/26/12 1010  NA 135 141 141  K 3.0* 3.0* 2.6*  CL 100 105 97  CO2 26 23 27   BUN 4* 9 14  CREATININE 0.66 0.75 0.86  CALCIUM 8.9 8.7 10.4  MG -- -- --  PHOS -- -- --  GLUCOSE 98 99 123*    CBG (last 3)  No results found for this basename: GLUCAP:3 in the last 72 hours  Scheduled Meds:   . gatorade (BH)  240 mL Oral BID  . heparin  5,000 Units Subcutaneous Q8H  . pantoprazole  40 mg Oral BID AC    Continuous Infusions:   Past Medical History  Diagnosis Date  . Abnormal Pap smear   . Urinary tract infection     Past Surgical History  Procedure Date  . Leep   . Leep   .  Hernia repair     As an infant    Jarold Motto MS, RD, LDN Pager: [phone] After-hours pager: [phone]

## 2012-01-29 LAB — BASIC METABOLIC PANEL
BUN: 6 mg/dL (ref 6–23)
CO2: 24 mEq/L (ref 19–32)
Chloride: 100 mEq/L (ref 96–112)
Creatinine, Ser: 0.7 mg/dL (ref 0.50–1.10)

## 2012-01-29 LAB — HEMOGLOBIN A1C: Hgb A1c MFr Bld: 5.7 % — ABNORMAL HIGH (ref <5.7)

## 2012-01-29 MED ORDER — POTASSIUM CHLORIDE 10 MEQ/100ML IV SOLN
INTRAVENOUS | Status: AC
  Administered 2012-01-29: 10 meq
  Filled 2012-01-29: qty 300

## 2012-01-29 MED ORDER — TRAMADOL HCL 50 MG PO TABS
50.0000 mg | ORAL_TABLET | Freq: Two times a day (BID) | ORAL | Status: DC | PRN
  Administered 2012-01-29: 50 mg via ORAL
  Filled 2012-01-29: qty 1

## 2012-01-29 MED ORDER — PANTOPRAZOLE SODIUM 40 MG IV SOLR
40.0000 mg | Freq: Two times a day (BID) | INTRAVENOUS | Status: DC
  Administered 2012-01-29 – 2012-01-30 (×3): 40 mg via INTRAVENOUS
  Filled 2012-01-29 (×4): qty 40

## 2012-01-29 MED ORDER — POTASSIUM CHLORIDE 10 MEQ/100ML IV SOLN
10.0000 meq | INTRAVENOUS | Status: AC
  Administered 2012-01-29: 10 meq via INTRAVENOUS

## 2012-01-29 MED ORDER — POTASSIUM CHLORIDE CRYS ER 20 MEQ PO TBCR
40.0000 meq | EXTENDED_RELEASE_TABLET | Freq: Once | ORAL | Status: AC
  Administered 2012-01-29: 40 meq via ORAL
  Filled 2012-01-29: qty 2

## 2012-01-29 MED ORDER — POLYETHYLENE GLYCOL 3350 17 G PO PACK
17.0000 g | PACK | Freq: Every day | ORAL | Status: DC | PRN
  Filled 2012-01-29: qty 1

## 2012-01-29 MED ORDER — METOCLOPRAMIDE HCL 5 MG PO TABS
5.0000 mg | ORAL_TABLET | Freq: Three times a day (TID) | ORAL | Status: DC
  Administered 2012-01-29 – 2012-01-30 (×3): 5 mg via ORAL
  Filled 2012-01-29 (×6): qty 1

## 2012-01-29 MED ORDER — BISACODYL 10 MG RE SUPP: 10.0000 mg | Freq: Every day | RECTAL | Status: DC | PRN

## 2012-01-29 NOTE — Progress Notes (Signed)
[facility] PROGRESS NOTE  Dawn Brock ZOX:[id number] DOB: 12-Nov-1985 DOA: 01/26/2012 PCP: Default, Provider, MD  Assessment/Plan:  Hypokalemia:  -Most likely secondary to nausea vomiting and decreased intravascular volume. Needs repletion today as well.   Nausea and vomiting: 2/2 UTI:  - She finished 5 days of antibiotics on 01/28/2012. She is still however having significant nausea today. She does try to eat breakfast and had an episode of emesis following that. We switch her back to a clear liquid diet today and continue to monitor.  - she is not a known diabetic to suspect gastroparesis. Will check a HBA1C. - will attempt reglan before meals to see if it helps her nausea.   Code Status: Full Family Communication: none Disposition Plan: home once able to tolerate po  Consultants:  none  Procedures:  none  Antibiotics:  Ciprofloxacin (1.14.14 - 1.16.14)  HPI/Subjective: Feeling well this morning, eager to try po intake, however had significant emesis after breakfast   Objective: Filed Vitals:   01/28/12 0624 01/28/12 0816 01/28/12 2158 01/29/12 0607  BP: 105/66  134/65 112/53  Pulse: 56  51 68  Temp: 98.6 F (37 C)  99.3 F (37.4 C) 98.2 F (36.8 C)  TempSrc: Oral  Oral   Resp: 17  16 18   Height: 5\' 7"  (1.702 m)     Weight: 77.2 kg (170 lb 3.1 oz) 72.031 kg (158 lb 12.8 oz)    SpO2: 100%  100% 100%    Intake/Output Summary (Last 24 hours) at 01/29/12 1137 Last data filed at 01/28/12 1838  Gross per 24 hour  Intake    240 ml  Output    400 ml  Net   -160 ml   Filed Weights   01/27/12 0630 01/28/12 0624 01/28/12 0816  Weight: 77.111 kg (170 lb) 77.2 kg (170 lb 3.1 oz) 72.031 kg (158 lb 12.8 oz)    Exam:   General:  NAD  Cardiovascular: RRR, no MRG  Respiratory: CTA biL, good air movement  Abdomen: soft, NTTP  Data Reviewed: Basic Metabolic Panel:  Lab 01/29/12 8119 01/28/12 0625 01/27/12 0448 01/26/12 1010  NA 136 135 141 141   K 2.9* 3.0* 3.0* 2.6*  CL 100 100 105 97  CO2 24 26 23 27   GLUCOSE 94 98 99 123*  BUN 6 4* 9 14  CREATININE 0.70 0.66 0.75 0.86  CALCIUM 9.1 8.9 8.7 10.4  MG -- -- -- --  PHOS -- -- -- --   Liver Function Tests:  Lab 01/27/12 0448 01/26/12 1010  AST 13 18  ALT 5 8  ALKPHOS 66 90  BILITOT 1.0 0.9  PROT 6.5 9.0*  ALBUMIN 3.6 4.9    Lab 01/26/12 1010  LIPASE 22  AMYLASE --   CBC:  Lab 01/26/12 1010  WBC 8.9  NEUTROABS 6.0  HGB 15.9*  HCT 45.8  MCV 95.0  PLT 269    Recent Results (from the past 240 hour(s))  URINE CULTURE     Status: Normal   Collection Time   01/26/12 12:02 PM      Component Value Range Status Comment   Specimen Description URINE, RANDOM   Final    Special Requests NONE   Final    Culture  Setup Time 01/26/2012 13:37   Final    Colony Count 75,000 COLONIES/ML   Final    Culture     Final    Value: Multiple bacterial morphotypes present, none predominant. Suggest appropriate recollection if clinically indicated.  Report Status 01/27/2012 FINAL   Final     Scheduled Meds:    . feeding supplement  237 mL Oral Q24H  . feeding supplement  1 Container Oral Q24H  . gatorade (BH)  240 mL Oral BID  . heparin  5,000 Units Subcutaneous Q8H  . pantoprazole (PROTONIX) IV  40 mg Intravenous Q12H  . potassium chloride  10 mEq Intravenous Q1 Hr x 3   Continuous Infusions:   Principal Problem:  *Hypokalemia Active Problems:  UTI (lower urinary tract infection)  Nausea and vomiting in adult  [facility], Stevphen Meuse  [facility] Pager [phone]. If 7PM-7AM, please contact night-coverage at [email or web address], password [facility] 01/29/2012, 11:37 AM  LOS: 3 days

## 2012-01-29 NOTE — Progress Notes (Addendum)
Patient continues to vomit , still with low potassium level, unable to absorb p.o. Meds, started new IV to give potassium replacement via IV, pt. Unable to tolerate IV runs, despite NS running with it on slow rate, requested to stop IV, MD made aware.

## 2012-01-30 LAB — BASIC METABOLIC PANEL
Calcium: 9.3 mg/dL (ref 8.4–10.5)
Chloride: 99 mEq/L (ref 96–112)
Creatinine, Ser: 0.7 mg/dL (ref 0.50–1.10)
GFR calc non Af Amer: >90 mL/min (ref >90)
Potassium: 3.3 mEq/L — ABNORMAL LOW (ref 3.5–5.1)

## 2012-01-30 NOTE — Discharge Summary (Signed)
Physician Discharge Summary  JAMILLE FISHER OZH:[id number] DOB: [birth year] DOA: 01/26/2012  PCP: Default, Provider, MD  Admit date: 01/26/2012 Discharge date: 01/30/2012  Time spent: 35 minutes  Recommendations for Outpatient Follow-up:  1. follow up with Urgent care center for a BMP recheck within a week from discharge or establish a PCP.  Discharge Diagnoses:  Principal Problem:  *Hypokalemia Active Problems:  UTI (lower urinary tract infection)  Nausea and vomiting in adult  Discharge Condition: stable  Diet recommendation: regular as tolerated  Filed Weights   01/28/12 0624 01/28/12 0816 01/30/12 0610  Weight: 77.2 kg (170 lb 3.1 oz) 72.031 kg (158 lb 12.8 oz) 74.1 kg (163 lb 5.8 oz)   History of present illness:  With past medical history of UTI she has not finished her antibiotics, Comes in for severe nausea and vomiting 1 day prior to admission progressively getting worse. She also feels weak and fatigued. She is complaining of some incontinence with sneezing or coughing. She relates no abdominal pain. She relates some chills but no fevers. She has missed several doses of her antibiotics.  Hospital Course:  Patients was admitted with severe nausea and vomiting thought secondary to her urinary tract infection. Due to her inability to have any by mouth intake, she was placed on IV ciprofloxacin. That was continued for 3 days, which would complete, along with the 2 days before her hospitalization, her treatment for the urinary tract infection. Patient was persistently hypokalemic during his hospitalization, likely secondary to her poor by mouth intake, nausea and vomiting. By 01/30/2012, she was able to tolerate by mouth intake and was discharged home. On discharge she was given a prescription for potassium chloride, to take 10 mEq per day, and set up an appointment early next week with her primary care doctor or at the urgent care Center for repeat potassium level.  She was  counseled about adequate hydration, and to advance her diet slowly as tolerated. Once she checks her potassium level next week and she is able to eat well, if her potassium repeat was normal she needs to stop her potassium supplements. She expressed understanding.  Procedures:  none (i.e. Studies not automatically included, echos, thoracentesis, etc; not x-rays)  Consultations:  none  Discharge Exam: Filed Vitals:   01/29/12 2130 01/30/12 0515 01/30/12 0610 01/30/12 0632  BP: 100/58 92/59  100/56  Pulse: 77 63    Temp: 98.6 F (37 C) 98.7 F (37.1 C)    TempSrc:      Resp: 16 18    Height:      Weight:   74.1 kg (163 lb 5.8 oz)   SpO2: 100% 100%      General: In no acute distress Cardiovascular: Regular rate and rhythm, no murmurs Respiratory: Clear to auscultation bilaterally Abdomen: Soft nontender to palpation     Medication List     As of 01/30/2012  4:46 PM    TAKE these medications         alum & mag hydroxide-simeth 200-200-20 MG/5ML suspension   Commonly known as: MAALOX/MYLANTA   Take 30 mLs by mouth every 6 (six) hours as needed (dyspepsia).      ondansetron 4 MG tablet   Commonly known as: ZOFRAN   Take 1 tablet (4 mg total) by mouth every 6 (six) hours as needed for nausea.      pantoprazole 40 MG tablet   Commonly known as: PROTONIX   Take 1 tablet (40 mg total) by mouth daily.  potassium chloride 10 MEQ tablet   Commonly known as: K-DUR   Take 1 tablet (10 mEq total) by mouth every morning.           Follow-up Information    Follow up with Default, Provider, MD.   Contact information:   [address] [place] [address] [phone]         The results of significant diagnostics from this hospitalization (including imaging, microbiology, ancillary and laboratory) are listed below for reference.    Significant Diagnostic Studies: Dg Abd Acute W/chest  01/26/2012  *RADIOLOGY REPORT*  Clinical Data: Lower abdominal pain, nausea,  vomiting.  ACUTE ABDOMEN SERIES (ABDOMEN 2 VIEW & CHEST 1 VIEW)  Comparison: 12/09/2011  Findings: Calcified phleboliths in the anatomic pelvis. The bowel gas pattern is normal.  There is no evidence of free intraperitoneal air.  No suspicious radio-opaque calculi or other significant radiographic abnormality is seen. Heart size and mediastinal contours are within normal limits.  Both lungs are clear.  IMPRESSION: No acute findings.   Original Report Authenticated By: Charlett Nose, M.D.    Microbiology: Recent Results (from the past 240 hour(s))  URINE CULTURE     Status: Normal   Collection Time   01/26/12 12:02 PM      Component Value Range Status Comment   Specimen Description URINE, RANDOM   Final    Special Requests NONE   Final    Culture  Setup Time 01/26/2012 13:37   Final    Colony Count 75,000 COLONIES/ML   Final    Culture     Final    Value: Multiple bacterial morphotypes present, none predominant. Suggest appropriate recollection if clinically indicated.   Report Status 01/27/2012 FINAL   Final     Labs: Basic Metabolic Panel:  Lab 01/30/12 3086 01/29/12 0545 01/28/12 0625 01/27/12 0448 01/26/12 1010  NA 135 136 135 141 141  K 3.3* 2.9* 3.0* 3.0* 2.6*  CL 99 100 100 105 97  CO2 21 24 26 23 27   GLUCOSE 98 94 98 99 123*  BUN 6 6 4* 9 14  CREATININE 0.70 0.70 0.66 0.75 0.86  CALCIUM 9.3 9.1 8.9 8.7 10.4  MG -- -- -- -- --  PHOS -- -- -- -- --   Liver Function Tests:  Lab 01/27/12 0448 01/26/12 1010  AST 13 18  ALT 5 8  ALKPHOS 66 90  BILITOT 1.0 0.9  PROT 6.5 9.0*  ALBUMIN 3.6 4.9    Lab 01/26/12 1010  LIPASE 22  AMYLASE --   CBC:  Lab 01/26/12 1010  WBC 8.9  NEUTROABS 6.0  HGB 15.9*  HCT 45.8  MCV 95.0  PLT 269    Signed:  Victoria Euceda  [facility] 01/30/2012, 4:46 PM

## 2012-01-30 NOTE — Progress Notes (Signed)
Patient ready for discharge home.  Patient was given discharge instructions and explained and patient verbalized understanding.  Patient is discharged home and advised to follow up should her symptoms return.  Roland Rack, RN

## 2012-01-30 NOTE — Progress Notes (Signed)
[facility] PROGRESS NOTE  Dawn Brock [id number]:[id number] DOB: 01-Jun-1985 DOA: 01/26/2012 PCP: Default, Provider, MD  Assessment/Plan:  Hypokalemia:  - Improved today. Would discharge on 10 mEq of potassium per day up until she is able to tolerate by mouth intake to the same extent to when she ate before her UTI. She was instructed to followup next week to get another basic metabolic panel checked.  Nausea and vomiting: 2/2 UTI:  - She finished 5 days of antibiotics on 01/28/2012. Her nausea improved this morning.  Code Status: Full Family Communication: none Disposition Plan: home once able to tolerate po  Consultants:  none  Procedures:  none  Antibiotics:  Ciprofloxacin (1.14.14 - 1.16.14)  HPI/Subjective: - In no acute distress, anxious to go home  Objective: Filed Vitals:   01/29/12 2130 01/30/12 0515 01/30/12 0610 01/30/12 0632  BP: 100/58 92/59  100/56  Pulse: 77 63    Temp: 98.6 F (37 C) 98.7 F (37.1 C)    TempSrc:      Resp: 16 18    Height:      Weight:   74.1 kg (163 lb 5.8 oz)   SpO2: 100% 100%      Intake/Output Summary (Last 24 hours) at 01/30/12 1639 Last data filed at 01/29/12 1700  Gross per 24 hour  Intake    120 ml  Output      0 ml  Net    120 ml   Filed Weights   01/28/12 0624 01/28/12 0816 01/30/12 0610  Weight: 77.2 kg (170 lb 3.1 oz) 72.031 kg (158 lb 12.8 oz) 74.1 kg (163 lb 5.8 oz)    Exam:   General:  NAD  Cardiovascular: RRR, no MRG  Respiratory: CTA biL, good air movement  Abdomen: soft, NTTP  Data Reviewed: Basic Metabolic Panel:  Lab 01/30/12 1027 01/29/12 0545 01/28/12 0625 01/27/12 0448 01/26/12 1010  NA 135 136 135 141 141  K 3.3* 2.9* 3.0* 3.0* 2.6*  CL 99 100 100 105 97  CO2 21 24 26 23 27   GLUCOSE 98 94 98 99 123*  BUN 6 6 4* 9 14  CREATININE 0.70 0.70 0.66 0.75 0.86  CALCIUM 9.3 9.1 8.9 8.7 10.4  MG -- -- -- -- --  PHOS -- -- -- -- --   Liver Function Tests:  Lab 01/27/12 0448  01/26/12 1010  AST 13 18  ALT 5 8  ALKPHOS 66 90  BILITOT 1.0 0.9  PROT 6.5 9.0*  ALBUMIN 3.6 4.9     Recent Results (from the past 240 hour(s))  URINE CULTURE     Status: Normal   Collection Time   01/26/12 12:02 PM      Component Value Range Status Comment   Specimen Description URINE, RANDOM   Final    Special Requests NONE   Final    Culture  Setup Time 01/26/2012 13:37   Final    Colony Count 75,000 COLONIES/ML   Final    Culture     Final    Value: Multiple bacterial morphotypes present, none predominant. Suggest appropriate recollection if clinically indicated.   Report Status 01/27/2012 FINAL   Final      Principal Problem:  *Hypokalemia Active Problems:  UTI (lower urinary tract infection)  Nausea and vomiting in adult  [facility], Stevphen Meuse  [facility] Pager [phone]. If 7PM-7AM, please contact night-coverage at [email or web address], password [facility] 01/30/2012, 4:39 PM  LOS: 4 days

## 2012-02-28 ENCOUNTER — Emergency Department (INDEPENDENT_AMBULATORY_CARE_PROVIDER_SITE_OTHER)
Admission: EM | Admit: 2012-02-28 | Discharge: 2012-02-28 | Disposition: A | Discharge status: Home / Self Care | Payer: Self-pay | Source: Home / Self Care

## 2012-02-28 ENCOUNTER — Encounter (HOSPITAL_COMMUNITY): Payer: Self-pay | Admitting: *Deleted

## 2012-02-28 DIAGNOSIS — M79609 Pain in unspecified limb: Secondary | ICD-10-CM

## 2012-02-28 DIAGNOSIS — M79603 Pain in arm, unspecified: Secondary | ICD-10-CM

## 2012-02-28 MED ORDER — AMITRIPTYLINE HCL 25 MG PO TABS: 25.0000 mg | ORAL_TABLET | Freq: Every day | ORAL | Status: DC

## 2012-02-28 NOTE — ED Notes (Signed)
Patient complains of left hand, wrist, and arm pain. Pain started 2 days ago; patient states limited range of movement left hand.

## 2012-02-28 NOTE — ED Provider Notes (Signed)
Dawn Brock is a 27 y.o. female who presents to Urgent Care today for left forearm pain.  Last month patient was hospitalized for urinary tract infection and dehydration with hypokalemia.  She was given IV potassium and noted significant burning in her left forearm proximal to the infusion site.  She denies any significant history of infiltration but does not continued pain.  The pain has been intermittent and worsened several days ago. She denies any fevers or chills or new injury.  She's tried some over-the-counter pain medications which have not been very helpful.  The pain is preventing her from sleeping, and she has missed work once or twice.  She feels well otherwise.   PMH reviewed. Otherwise healthy History  Substance Use Topics  . Smoking status: Current Every Day Smoker -- 1.00 packs/day for 14 years    Types: Cigarettes  . Smokeless tobacco: Never Used  . Alcohol Use: 7.2 oz/week    12 Glasses of wine per week     Comment: Weekends   ROS as above Medications reviewed. No current facility-administered medications for this encounter.   Current Outpatient Prescriptions  Medication Sig Dispense Refill  . alum & mag hydroxide-simeth (MAALOX/MYLANTA) 200-200-20 MG/5ML suspension Take 30 mLs by mouth every 6 (six) hours as needed (dyspepsia).  355 mL  2  . amitriptyline (ELAVIL) 25 MG tablet Take 1 tablet (25 mg total) by mouth at bedtime.  30 tablet  1  . ondansetron (ZOFRAN) 4 MG tablet Take 1 tablet (4 mg total) by mouth every 6 (six) hours as needed for nausea.  20 tablet  0  . pantoprazole (PROTONIX) 40 MG tablet Take 1 tablet (40 mg total) by mouth daily.  30 tablet  2  . potassium chloride (K-DUR) 10 MEQ tablet Take 1 tablet (10 mEq total) by mouth every morning.  7 tablet  0    Exam:  LMP 02/12/2012 Gen: Well NAD LEFT ARM: Normal-appearing, tender to palpation over radial styloid.  No erythema or significant swelling. Superficial vein is tender with no palpable  cord.  Normal wrist motion strength and sensation. Negative Finkelstein's test.  Grip strength is intact.   No results found for this or any previous visit (from the past 24 hour(s)). No results found.  Assessment and Plan: 27 y.o. female with superficial phlebitis.  Patient likely has mildly irritated pain following IV infusion of potassium.  She has hyperesthesia as a result.  She has no signs or symptoms of infection or clot.  Plan: Amitriptyline at night to help sleep and for pain control. Wrist motion exercises. Compression (Ace wrap). Followup with myself at the sports medicine Center in 2 weeks.  Patient expresses understanding and agreement     Rodolph Bong, MD 02/28/12 [phone]

## 2012-02-28 NOTE — ED Provider Notes (Signed)
Medical screening examination/treatment/procedure(s) were performed by a resident physician and as supervising physician I was immediately available for consultation/collaboration.  Leslee Home, M.D.  Reuben Likes, MD 02/28/12 [phone]

## 2012-03-09 ENCOUNTER — Emergency Department (HOSPITAL_COMMUNITY): Payer: Medicaid Other

## 2012-03-09 ENCOUNTER — Inpatient Hospital Stay (HOSPITAL_COMMUNITY)
Admission: EM | Admit: 2012-03-09 | Discharge: 2012-03-12 | DRG: 392 | Disposition: A | Discharge status: Home / Self Care | Payer: Medicaid Other | Attending: Internal Medicine | Admitting: Internal Medicine

## 2012-03-09 ENCOUNTER — Encounter (HOSPITAL_COMMUNITY): Payer: Self-pay | Admitting: Emergency Medicine

## 2012-03-09 DIAGNOSIS — Z79899 Other long term (current) drug therapy: Secondary | ICD-10-CM

## 2012-03-09 DIAGNOSIS — E876 Hypokalemia: Secondary | ICD-10-CM | POA: Diagnosis present

## 2012-03-09 DIAGNOSIS — F1010 Alcohol abuse, uncomplicated: Secondary | ICD-10-CM | POA: Diagnosis present

## 2012-03-09 DIAGNOSIS — K2980 Duodenitis without bleeding: Principal | ICD-10-CM | POA: Diagnosis present

## 2012-03-09 DIAGNOSIS — R112 Nausea with vomiting, unspecified: Secondary | ICD-10-CM

## 2012-03-09 DIAGNOSIS — F1220 Cannabis dependence, uncomplicated: Secondary | ICD-10-CM | POA: Diagnosis present

## 2012-03-09 DIAGNOSIS — F1020 Alcohol dependence, uncomplicated: Secondary | ICD-10-CM | POA: Diagnosis present

## 2012-03-09 DIAGNOSIS — R197 Diarrhea, unspecified: Secondary | ICD-10-CM | POA: Diagnosis present

## 2012-03-09 DIAGNOSIS — F17200 Nicotine dependence, unspecified, uncomplicated: Secondary | ICD-10-CM | POA: Diagnosis present

## 2012-03-09 HISTORY — DX: Gastritis, unspecified, without bleeding: K29.70

## 2012-03-09 LAB — URINALYSIS, ROUTINE W REFLEX MICROSCOPIC
Glucose, UA: NEGATIVE mg/dL
Hgb urine dipstick: NEGATIVE
Ketones, ur: NEGATIVE mg/dL
Protein, ur: 30 mg/dL — AB
Urobilinogen, UA: 0.2 mg/dL (ref 0.0–1.0)

## 2012-03-09 LAB — COMPREHENSIVE METABOLIC PANEL
Albumin: 4.4 g/dL (ref 3.5–5.2)
Alkaline Phosphatase: 88 U/L (ref 39–117)
BUN: 8 mg/dL (ref 6–23)
Chloride: 104 mEq/L (ref 96–112)
GFR calc Af Amer: >90 mL/min (ref >90)
Glucose, Bld: 140 mg/dL — ABNORMAL HIGH (ref 70–99)
Potassium: 3.5 mEq/L (ref 3.5–5.1)
Total Bilirubin: 0.3 mg/dL (ref 0.3–1.2)

## 2012-03-09 LAB — CBC
HCT: 40.2 % (ref 36.0–46.0)
MCH: 32.4 pg (ref 26.0–34.0)
MCHC: 33.8 g/dL (ref 30.0–36.0)
RDW: 12.5 % (ref 11.5–15.5)

## 2012-03-09 LAB — URINE MICROSCOPIC-ADD ON

## 2012-03-09 LAB — LIPASE, BLOOD: Lipase: 15 U/L (ref 11–59)

## 2012-03-09 MED ORDER — ONDANSETRON HCL 4 MG/2ML IJ SOLN
4.0000 mg | Freq: Once | INTRAMUSCULAR | Status: AC
  Administered 2012-03-09: 4 mg via INTRAVENOUS
  Filled 2012-03-09: qty 2

## 2012-03-09 MED ORDER — LORAZEPAM 2 MG/ML IJ SOLN
1.0000 mg | Freq: Once | INTRAMUSCULAR | Status: AC
  Administered 2012-03-09: 1 mg via INTRAVENOUS
  Filled 2012-03-09: qty 1

## 2012-03-09 MED ORDER — IOHEXOL 300 MG/ML  SOLN
100.0000 mL | Freq: Once | INTRAMUSCULAR | Status: AC | PRN
  Administered 2012-03-09: 100 mL via INTRAVENOUS

## 2012-03-09 MED ORDER — MORPHINE SULFATE 4 MG/ML IJ SOLN
4.0000 mg | Freq: Once | INTRAMUSCULAR | Status: AC
  Administered 2012-03-09: 4 mg via INTRAVENOUS
  Filled 2012-03-09: qty 1

## 2012-03-09 MED ORDER — SODIUM CHLORIDE 0.9 % IV BOLUS (SEPSIS)
1000.0000 mL | Freq: Once | INTRAVENOUS | Status: AC
  Administered 2012-03-09: 1000 mL via INTRAVENOUS

## 2012-03-09 MED ORDER — PROMETHAZINE HCL 25 MG/ML IJ SOLN
25.0000 mg | Freq: Once | INTRAMUSCULAR | Status: AC
  Administered 2012-03-09: 25 mg via INTRAVENOUS
  Filled 2012-03-09: qty 1

## 2012-03-09 MED ORDER — PROMETHAZINE HCL 25 MG RE SUPP: 25.0000 mg | Freq: Four times a day (QID) | RECTAL | Status: DC | PRN

## 2012-03-09 MED ORDER — PANTOPRAZOLE SODIUM 40 MG IV SOLR
40.0000 mg | Freq: Once | INTRAVENOUS | Status: AC
  Administered 2012-03-09: 40 mg via INTRAVENOUS
  Filled 2012-03-09: qty 40

## 2012-03-09 NOTE — ED Notes (Signed)
Pt. Complains of being weak, unable to ambulate. PA at bedside.

## 2012-03-09 NOTE — Progress Notes (Signed)
During [facility] ED 03/09/12 visit pt was seen by [facility] liaison  Pt offered services to assist with finding a [place] self pay provider, resources & health reform information 

## 2012-03-09 NOTE — ED Notes (Signed)
Attempted to d/c pt however upon awakening pt she become acutely nauseous and began vomiting again. Pt also continues to complain of abd discomfort. Melvenia Beam, PA notified of pt's current status.

## 2012-03-09 NOTE — ED Provider Notes (Signed)
Patient care transferred from Dr. Manus Gunning pending urinalysis, which does not show infection. She vomited x 1 prior to re-evaluation. Additional Zofran given, no further vomiting. She reports nausea is better. She can be discharged home to follow up with her doctor. Phenergan for home use.  Arnoldo Hooker, PA-C 03/09/12 1932

## 2012-03-09 NOTE — ED Notes (Signed)
2 hours ago she began having n/v/d, pt voided on stretcher. States  That no one at home sick,last meal at 0900 this am.

## 2012-03-09 NOTE — ED Notes (Signed)
PT TRIED TO USE FEMALE URINAL-NO RETURN, PT STATES CAN NOT WALK TO BATHROOM

## 2012-03-09 NOTE — ED Notes (Signed)
Bed:WA24<BR> Expected date:<BR> Expected time:<BR> Means of arrival:<BR> Comments:<BR> EMS

## 2012-03-09 NOTE — ED Notes (Signed)
Pt resting quietly with eyes closed. Pt states she is unable to give urine sample at this time. VSS. IV fluids infusing per order.

## 2012-03-09 NOTE — ED Provider Notes (Signed)
History     CSN: [id number]  Arrival date & time 03/09/12  1359   First MD Initiated Contact with Patient 03/09/12 1406      Chief Complaint  Patient presents with  . Nausea  . Emesis    (Consider location/radiation/quality/duration/timing/severity/associated sxs/prior treatment) HPI Comments: Patient presents from home with 2 hour history of nausea and vomiting diffuse abdominal pain. She denies diarrhea, fever. She endorses chills and bodyaches. 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 She denies any dysuria, hematuria, vaginal bleeding or discharge. She denies any back pain. She denies any sick contacts at home. She denies any unusual foods.  The history is provided by the patient and the EMS personnel.    Past Medical History  Diagnosis Date  . Abnormal Pap smear   . Urinary tract infection   . Gastritis     Past Surgical History  Procedure Laterality Date  . Leep    . Leep    . Hernia repair      As an infant    Family History  Problem Relation Age of Onset  . Hypertension Mother   . Cirrhosis Father     History  Substance Use Topics  . Smoking status: Current Every Day Smoker -- 1.00 packs/day for 14 years    Types: Cigarettes  . Smokeless tobacco: Never Used  . Alcohol Use: 7.2 oz/week    12 Glasses of wine per week     Comment: Weekends    OB History   Grav Para Term Preterm Abortions TAB SAB Ect Mult Living   2 2 0 0 0 0 0 0 0 2       Review of Systems  Constitutional: Positive for activity change and appetite change. Negative for fever.  HENT: Negative for congestion and rhinorrhea.   Respiratory: Negative for cough, chest tightness and shortness of breath.   Cardiovascular: Negative for chest pain.  Gastrointestinal: Positive for nausea, vomiting and abdominal pain. Negative for diarrhea.  Genitourinary: Negative for vaginal bleeding and vaginal  discharge.  Musculoskeletal: Negative for back pain.  Skin: Negative for rash.  Neurological: Positive for weakness. Negative for dizziness and headaches.  A complete 10 system review of systems was obtained and all systems are negative except as noted in the HPI and PMH.    Allergies  Review of patient's allergies indicates no known allergies.  Home Medications   Current Outpatient Rx  Name  Route  Sig  Dispense  Refill  . alum & mag hydroxide-simeth (MAALOX/MYLANTA) 200-200-20 MG/5ML suspension   Oral   Take 30 mLs by mouth every 6 (six) hours as needed (dyspepsia).   355 mL   2   . amitriptyline (ELAVIL) 25 MG tablet   Oral   Take 1 tablet (25 mg total) by mouth at bedtime.   30 tablet   1   . ondansetron (ZOFRAN) 4 MG tablet   Oral   Take 1 tablet (4 mg total) by mouth every 6 (six) hours as needed for nausea.   20 tablet   0   . pantoprazole (PROTONIX) 40 MG tablet   Oral   Take 1 tablet (40 mg total) by mouth daily.   30 tablet   2   . potassium chloride (K-DUR) 10 MEQ tablet   Oral   Take 1 tablet (10 mEq total) by mouth every morning.   7 tablet   0  BP 148/78  Pulse 51  Temp(Src) 97.8 F (36.6 C) (Oral)  Resp 22  SpO2 96%  LMP 02/12/2012  Physical Exam  Constitutional: She is oriented to person, place, and time. She appears well-developed and well-nourished. She appears distressed.  Moaning and dry heaving in the bed, but makeup applied impeccably  HENT:  Head: Normocephalic and atraumatic.  Mouth/Throat: Oropharynx is clear and moist. No oropharyngeal exudate.  Moist mucus membranes  Eyes: Conjunctivae and EOM are normal. Pupils are equal, round, and reactive to light.  Cardiovascular: Normal rate, regular rhythm and normal heart sounds.   No murmur heard. Pulmonary/Chest: Effort normal and breath sounds normal. No respiratory distress.  Abdominal: Soft. There is no tenderness. There is no rebound and no guarding.  No peritoneal signs   Musculoskeletal: Normal range of motion. She exhibits no edema and no tenderness.  Neurological: She is alert and oriented to person, place, and time. No cranial nerve deficit. She exhibits normal muscle tone. Coordination normal.  Skin: Skin is warm.    ED Course  Procedures (including critical care time)  Labs Reviewed  COMPREHENSIVE METABOLIC PANEL - Abnormal; Notable for the following:    Glucose, Bld 140 (*)    All other components within normal limits  LIPASE, BLOOD  CBC  URINALYSIS, ROUTINE W REFLEX MICROSCOPIC  PREGNANCY, URINE   No results found.   No diagnosis found.    MDM  Acute onset of nausea and vomiting with history of gastritis. Vital stable, abdomen soft and nonsurgical.  Electrolytes normal. Patient treated with IV fluids, antiemetics, Protonix.  Abdomen soft and nonsurgical. Tolerating by mouth in the ED. Continue additional IV fluids, check urinalysis for ketones an infection. PA Upstill to disposition appropriately.     Glynn Octave, MD 03/09/12 [phone]

## 2012-03-10 ENCOUNTER — Encounter (HOSPITAL_COMMUNITY): Payer: Self-pay | Admitting: Family Medicine

## 2012-03-10 LAB — CBC
HCT: 38.1 % (ref 36.0–46.0)
Hemoglobin: 12.9 g/dL (ref 12.0–15.0)
RBC: 3.95 MIL/uL (ref 3.87–5.11)
WBC: 8.9 10*3/uL (ref 4.0–10.5)

## 2012-03-10 LAB — RAPID URINE DRUG SCREEN, HOSP PERFORMED
Barbiturates: NOT DETECTED
Benzodiazepines: NOT DETECTED
Cocaine: NOT DETECTED
Opiates: NOT DETECTED
Tetrahydrocannabinol: POSITIVE — AB

## 2012-03-10 LAB — BASIC METABOLIC PANEL
Chloride: 107 mEq/L (ref 96–112)
GFR calc Af Amer: >90 mL/min (ref >90)
GFR calc non Af Amer: >90 mL/min (ref >90)
Potassium: 3.4 mEq/L — ABNORMAL LOW (ref 3.5–5.1)
Sodium: 139 mEq/L (ref 135–145)

## 2012-03-10 MED ORDER — ALUM & MAG HYDROXIDE-SIMETH 200-200-20 MG/5ML PO SUSP: 30.0000 mL | Freq: Four times a day (QID) | ORAL | Status: DC | PRN

## 2012-03-10 MED ORDER — METOCLOPRAMIDE HCL 5 MG/ML IJ SOLN
5.0000 mg | Freq: Four times a day (QID) | INTRAMUSCULAR | Status: DC
  Administered 2012-03-10 – 2012-03-12 (×7): 5 mg via INTRAVENOUS
  Filled 2012-03-10 (×15): qty 1

## 2012-03-10 MED ORDER — LORAZEPAM 1 MG PO TABS
1.0000 mg | ORAL_TABLET | Freq: Four times a day (QID) | ORAL | Status: DC | PRN
  Administered 2012-03-10: 1 mg via ORAL
  Filled 2012-03-10: qty 1

## 2012-03-10 MED ORDER — POTASSIUM CHLORIDE CRYS ER 20 MEQ PO TBCR
40.0000 meq | EXTENDED_RELEASE_TABLET | Freq: Once | ORAL | Status: AC
  Administered 2012-03-10: 40 meq via ORAL
  Filled 2012-03-10: qty 2

## 2012-03-10 MED ORDER — PANTOPRAZOLE SODIUM 40 MG IV SOLR
40.0000 mg | Freq: Every day | INTRAVENOUS | Status: DC
  Administered 2012-03-10 – 2012-03-11 (×2): 40 mg via INTRAVENOUS
  Filled 2012-03-10 (×3): qty 40

## 2012-03-10 MED ORDER — ONDANSETRON HCL 4 MG PO TABS: 4.0000 mg | ORAL_TABLET | Freq: Four times a day (QID) | ORAL | Status: DC | PRN

## 2012-03-10 MED ORDER — ADULT MULTIVITAMIN W/MINERALS CH
1.0000 | ORAL_TABLET | Freq: Every day | ORAL | Status: DC
  Administered 2012-03-11 – 2012-03-12 (×2): 1 via ORAL
  Filled 2012-03-10 (×3): qty 1

## 2012-03-10 MED ORDER — SODIUM CHLORIDE 0.9 % IV SOLN: INTRAVENOUS | Status: DC

## 2012-03-10 MED ORDER — MORPHINE SULFATE 2 MG/ML IJ SOLN
2.0000 mg | INTRAMUSCULAR | Status: DC | PRN
  Administered 2012-03-10 – 2012-03-11 (×4): 2 mg via INTRAVENOUS
  Filled 2012-03-10 (×4): qty 1

## 2012-03-10 MED ORDER — AMITRIPTYLINE HCL 25 MG PO TABS
25.0000 mg | ORAL_TABLET | Freq: Every day | ORAL | Status: DC
  Administered 2012-03-10 – 2012-03-11 (×3): 25 mg via ORAL
  Filled 2012-03-10 (×4): qty 1

## 2012-03-10 MED ORDER — SODIUM CHLORIDE 0.9 % IV SOLN
INTRAVENOUS | Status: DC
  Administered 2012-03-10 – 2012-03-12 (×7): via INTRAVENOUS

## 2012-03-10 MED ORDER — ONDANSETRON HCL 4 MG/2ML IJ SOLN
4.0000 mg | Freq: Four times a day (QID) | INTRAMUSCULAR | Status: DC | PRN
  Administered 2012-03-10 – 2012-03-11 (×4): 4 mg via INTRAVENOUS
  Filled 2012-03-10 (×4): qty 2

## 2012-03-10 MED ORDER — ACETAMINOPHEN 325 MG PO TABS: 650.0000 mg | ORAL_TABLET | Freq: Four times a day (QID) | ORAL | Status: DC | PRN

## 2012-03-10 MED ORDER — ACETAMINOPHEN 650 MG RE SUPP: 650.0000 mg | Freq: Four times a day (QID) | RECTAL | Status: DC | PRN

## 2012-03-10 MED ORDER — FOLIC ACID 1 MG PO TABS
1.0000 mg | ORAL_TABLET | Freq: Every day | ORAL | Status: DC
  Administered 2012-03-10 – 2012-03-12 (×3): 1 mg via ORAL
  Filled 2012-03-10 (×3): qty 1

## 2012-03-10 MED ORDER — VITAMIN B-1 100 MG PO TABS
100.0000 mg | ORAL_TABLET | Freq: Every day | ORAL | Status: DC
  Administered 2012-03-10 – 2012-03-12 (×3): 100 mg via ORAL
  Filled 2012-03-10 (×3): qty 1

## 2012-03-10 MED ORDER — THIAMINE HCL 100 MG/ML IJ SOLN
100.0000 mg | Freq: Every day | INTRAMUSCULAR | Status: DC
  Filled 2012-03-10 (×3): qty 1

## 2012-03-10 MED ORDER — ADULT MULTIVITAMIN W/MINERALS CH: 1.0000 | ORAL_TABLET | Freq: Every day | ORAL | Status: DC

## 2012-03-10 MED ORDER — LORAZEPAM 2 MG/ML IJ SOLN
1.0000 mg | Freq: Four times a day (QID) | INTRAMUSCULAR | Status: DC | PRN
  Administered 2012-03-10 – 2012-03-11 (×4): 1 mg via INTRAVENOUS
  Filled 2012-03-10 (×4): qty 1

## 2012-03-10 NOTE — Progress Notes (Signed)
INITIAL NUTRITION ASSESSMENT  DOCUMENTATION CODES Per approved criteria  -Not Applicable   INTERVENTION: - Anti-emetics per MD - Will continue to monitor intake   NUTRITION DIAGNOSIS: Altered GI function related to vomiting, alcohol abuse as evidenced by RN/pt report, MD notes.   Goal: 1. No further vomiting 2. Pt to consume >90% of meals  Monitor:  Weights, labs, intake, nausea/vomiting  Reason for Assessment: Nutrition risk   27 y.o. female  Admitting Dx: Duodenitis  ASSESSMENT: Attempted multiple times to visit with pt today however each time pt asleep and would only open eyes slightly then fall back asleep. Per MD notes, pt admitted to drinking a fifth of liquor daily, on CIWA protocol. Pt getting multivitamin, folic acid, and thiamine. Unsure of usual intake. Past records show pt's weight up 8 pounds in the past month. Per RN, pt vomited large amount after eating lunch today.    Height: Ht Readings from Last 1 Encounters:  03/10/12 5\' 7"  (1.702 m)    Weight: Wt Readings from Last 1 Encounters:  03/10/12 171 lb 15.3 oz (78 kg)    Ideal Body Weight: 135 lb  % Ideal Body Weight: 127  Wt Readings from Last 10 Encounters:  03/10/12 171 lb 15.3 oz (78 kg)  01/30/12 163 lb 5.8 oz (74.1 kg)  12/05/11 166 lb (75.297 kg)  11/24/11 166 lb (75.297 kg)  09/07/11 163 lb (73.936 kg)  06/14/11 155 lb (70.308 kg)  08/09/10 163 lb 6 oz (74.106 kg)    Usual Body Weight: 163 lb last month  % Usual Body Weight: 105  BMI:  Body mass index is 26.93 kg/(m^2).  Estimated Nutritional Needs: Kcal: 1550-1950 Protein: 80-95g Fluid: 1.5-1.9L/day  Skin: Intact  Diet Order: Clear Liquid  EDUCATION NEEDS: -No education needs identified at this time   Intake/Output Summary (Last 24 hours) at 03/10/12 1547 Last data filed at 03/10/12 1100  Gross per 24 hour  Intake    736 ml  Output    402 ml  Net    334 ml    Last BM: 2/26  Labs:   Recent Labs Lab  03/09/12 1435 03/10/12 0544  NA 138 139  K 3.5 3.4*  CL 104 107  CO2 20 21  BUN 8 7  CREATININE 0.64 0.55  CALCIUM 9.5 8.9  GLUCOSE 140* 115*    CBG (last 3)  No results found for this basename: GLUCAP,  in the last 72 hours  Scheduled Meds: . amitriptyline  25 mg Oral QHS  . folic acid  1 mg Oral Daily  . metoCLOPramide (REGLAN) injection  5 mg Intravenous Q6H  . multivitamin with minerals  1 tablet Oral Daily  . pantoprazole (PROTONIX) IV  40 mg Intravenous Daily  . thiamine  100 mg Oral Daily   Or  . thiamine  100 mg Intravenous Daily    Continuous Infusions: . sodium chloride 125 mL/hr at 03/10/12 4098    Past Medical History  Diagnosis Date  . Abnormal Pap smear   . Urinary tract infection   . Gastritis     Past Surgical History  Procedure Laterality Date  . Leep    . Leep    . Hernia repair      As an infant     Levon Hedger MS, RD, LDN [phone] Pager [phone] After Hours Pager

## 2012-03-10 NOTE — Progress Notes (Signed)
Patient stated she was feeling better this afternoon and ready to go home. MD wrote discharge orders and advanced to regular diet. Soon after eating some of tray, patient vomited. This RN visualized patient throwing up and patient now states she is not ready to go home. Paged Dr. Darnelle Catalan and discharge cancelled along with new order for patient to be NPO. Will continue to monitor for duration of shift. Angelena Form, RN

## 2012-03-10 NOTE — Progress Notes (Signed)
Clinical Social Work Department BRIEF PSYCHOSOCIAL ASSESSMENT 03/10/2012  Patient:  BALINDA, HEACOCK     Account Number:  [id number]     Admit date:  03/09/2012  Clinical Social Worker:  Hattie Perch  Date/Time:  03/10/2012 12:00 M  Referred by:  Physician  Date Referred:  03/10/2012 Referred for  Substance Abuse   Other Referral:   Interview type:  Patient Other interview type:    PSYCHOSOCIAL DATA Living Status:  ALONE Admitted from facility:   Level of care:   Primary support name:   Primary support relationship to patient:   Degree of support available:   poor    CURRENT CONCERNS Current Concerns  Substance Abuse   Other Concerns:    SOCIAL WORK ASSESSMENT / PLAN CSW met with patient to discuss outpatient substance abuse resources. Patient was alert and oriented X3. Patient did not want to speak with CSW but accepted the resources list that CSW had brought without looking at CSW.   Assessment/plan status:   Other assessment/ plan:   Information/referral to community resources:   Gave outpatient substance abuse resources.    PATIENT'S/FAMILY'S RESPONSE TO PLAN OF CARE: patient not wanting to discuss her problem with CSW at this time but did reluctantly accept the list of places to seek help.

## 2012-03-10 NOTE — H&P (Signed)
History and Physical  Dawn Brock ZOX:[id number] DOB: May 23, 1985 DOA: 03/09/2012  Referring physician: Marlon Pel, PA PCP: Default, Provider, MD none  Chief Complaint: Nausea vomiting  HPI:  27 year old woman presented to the emergency department with nausea, vomiting, diarrhea. Initial laboratory evaluation was unremarkable and patient was monitored in the emergency department but failed by mouth trials. CT of the abdomen and pelvis revealed possible duodenitis.  Patient reports onset of symptoms 2/26 in the morning with nausea and repeated episodes of vomiting. She had 2 episodes of diarrhea as well. General abdominal cramping associated with this. No sick contacts.  In the emergency department noted to be afebrile. Mild bradycardia. Vitals otherwise unremarkable. Complete metabolic panel, CBC unremarkable except for random hyperglycemia. Urine pregnancy negative. Urinalysis negative. CT abdomen and pelvis suggested the possibility of duodenitis versus underdistention. She was observed in the emergency department for approximately 10 hours and give him lorazepam, morphine, Zofran, Phenergan and Protonix. However she continued to vomit and so was referred for admission.  Chart Review:  Admitted 01/2012 for nausea, vomiting, hypokalemia. Treat for urinary tract infection. Discharged on potassium.  Review of Systems:  Unreliable secondary to sleepiness for medications. However does not report any other symptoms except as in history of present illness.  Past Medical History  Diagnosis Date  . Abnormal Pap smear   . Urinary tract infection   . Gastritis     Past Surgical History  Procedure Laterality Date  . Leep    . Leep    . Hernia repair      As an infant    Social History:  reports that she has been smoking Cigarettes.  She has a 14 pack-year smoking history. She has never used smokeless tobacco. She reports that she drinks about 7.2 ounces of alcohol per week. She  reports that she does not use illicit drugs.  No Known Allergies  Family History  Problem Relation Age of Onset  . Hypertension Mother   . Cirrhosis Father      Prior to Admission medications   Medication Sig Start Date End Date Taking? Authorizing Provider  alum & mag hydroxide-simeth (MAALOX/MYLANTA) 200-200-20 MG/5ML suspension Take 30 mLs by mouth every 6 (six) hours as needed (dyspepsia). 01/28/12   Pamella Pert, MD  amitriptyline (ELAVIL) 25 MG tablet Take 1 tablet (25 mg total) by mouth at bedtime. 02/28/12   Rodolph Bong, MD  ondansetron (ZOFRAN) 4 MG tablet Take 1 tablet (4 mg total) by mouth every 6 (six) hours as needed for nausea. 01/28/12   Pamella Pert, MD  pantoprazole (PROTONIX) 40 MG tablet Take 1 tablet (40 mg total) by mouth daily. 01/28/12   Pamella Pert, MD  potassium chloride (K-DUR) 10 MEQ tablet Take 1 tablet (10 mEq total) by mouth every morning. 01/28/12   Pamella Pert, MD  promethazine (PHENERGAN) 25 MG suppository Place 1 suppository (25 mg total) rectally every 6 (six) hours as needed for nausea. 03/09/12   Arnoldo Hooker, PA-C   Physical Exam: Filed Vitals:   03/09/12 1414 03/09/12 1556 03/09/12 1824 03/09/12 2054  BP: 148/78 121/75 125/76 101/83  Pulse: 51 68 56 58  Temp: 97.8 F (36.6 C)  98.7 F (37.1 C) 98.2 F (36.8 C)  TempSrc: Oral  Oral Oral  Resp: 22 15 20 18   SpO2: 96% 99% 100% 100%    General:  Examined in the emergency department. Appears calm and comfortable. Sleepy. Eyes: PERRL, normal lids, irises  ENT: grossly normal hearing, lips &  tongue. Mucous membranes moist. Neck: no LAD, masses or thyromegaly Cardiovascular: RRR, no m/r/g. No LE edema. Respiratory: CTA bilaterally, no w/r/r. Normal respiratory effort. Abdomen: soft, ntnd, positive bowel sounds Skin: no rash or induration seen o Musculoskeletal: grossly normal tone BUE/BLE Psychiatric: grossly normal mood and affect, sleepy, speech fluent and appropriate Neurologic:  grossly non-focal.  Wt Readings from Last 3 Encounters:  01/30/12 74.1 kg (163 lb 5.8 oz)  12/05/11 75.297 kg (166 lb)  11/24/11 75.297 kg (166 lb)    Labs on Admission:  Basic Metabolic Panel:  Recent Labs Lab 03/09/12 1435  NA 138  K 3.5  CL 104  CO2 20  GLUCOSE 140*  BUN 8  CREATININE 0.64  CALCIUM 9.5    Liver Function Tests:  Recent Labs Lab 03/09/12 1435  AST 22  ALT 10  ALKPHOS 88  BILITOT 0.3  PROT 7.8  ALBUMIN 4.4    Recent Labs Lab 03/09/12 1435  LIPASE 15    CBC:  Recent Labs Lab 03/09/12 1435  WBC 10.3  HGB 13.6  HCT 40.2  MCV 95.7  PLT 253    Radiological Exams on Admission: Ct Abdomen Pelvis W Contrast  03/09/2012  *RADIOLOGY REPORT*  Clinical Data: To our history of nausea and vomiting with diffuse abdominal pain.  Chills and body aches.  CT ABDOMEN AND PELVIS WITH CONTRAST  Technique:  Multidetector CT imaging of the abdomen and pelvis was performed following the standard protocol during bolus administration of intravenous contrast.  Contrast: OMNIPAQUE IOHEXOL 300 MG/ML  SOLN  Comparison: 12/09/2011  Findings: The lung bases are clear.  The liver, spleen, gallbladder, pancreas, adrenal glands, kidneys, abdominal aorta, and retroperitoneal lymph nodes are unremarkable. The stomach is decompressed.  There appears to be diffuse duodenal wall thickening which may be due to under distension but duodenitis may be present.  Jejunum and ileum are decompressed.  Stool filled colon without abnormal distension.  Scattered diverticula.  No free air or free fluid in the abdomen.  Pelvis:  The bladder is decompressed.  Uterus and ovaries are not enlarged.  No free or loculated pelvic fluid collections.  The appendix is normal.  No evidence of diverticulitis.  No significant pelvic lymphadenopathy.  Normal alignment of the lumbar vertebrae.  IMPRESSION: Suggestion of diffuse duodenal wall thickening which may represent duodenitis.  No other acute  changes are identified.   Original Report Authenticated By: Burman Nieves, M.D.     Principal Problem:   Nausea and vomiting in adult Active Problems:   Abdominal pain   Diarrhea   Cigarette smoker   Assessment/Plan 1. Nausea, vomiting, abdominal pain, diarrhea: Suspect viral process. CT suggested duodenitis versus underdistention. Normal white blood cell count of the fever, normal vitals. Reports diarrhea earlier today, CT revealed stool filled colon. May need a bowel regimen. 2. History of alcohol use: Monitor for withdrawal. No alcohol level obtained on admission. At time of consultation for admission patient been in the department approximately 10 hours. Check urine drug screen. 3. Smoker: Recommend cessation.   Code Status: full code Family Communication: None present Disposition Plan/Anticipated LOS: observation, one dose today  Time spent: 45 minutes  Brendia Sacks, MD  [facility] Pager [phone] 03/10/2012, 12:11 AM

## 2012-03-10 NOTE — ED Provider Notes (Signed)
Patient and off from [facility], for nausea and vomiting. She was going to be discharged when she started vomiting again. The patient endorses feeling sick and weak. Anytime we try to arouse her she begins vomiting again. CT abd/pelv added to r/o an acute intra abdominal pathology.  Results for orders placed during the hospital encounter of 03/09/12  URINALYSIS, ROUTINE W REFLEX MICROSCOPIC      Result Value Range   Color, Urine YELLOW  YELLOW   APPearance CLOUDY (*) CLEAR   Specific Gravity, Urine 1.021  1.005 - 1.030   pH 8.0  5.0 - 8.0   Glucose, UA NEGATIVE  NEGATIVE mg/dL   Hgb urine dipstick NEGATIVE  NEGATIVE   Bilirubin Urine NEGATIVE  NEGATIVE   Ketones, ur NEGATIVE  NEGATIVE mg/dL   Protein, ur 30 (*) NEGATIVE mg/dL   Urobilinogen, UA 0.2  0.0 - 1.0 mg/dL   Nitrite NEGATIVE  NEGATIVE   Leukocytes, UA NEGATIVE  NEGATIVE  PREGNANCY, URINE      Result Value Range   Preg Test, Ur NEGATIVE  NEGATIVE  COMPREHENSIVE METABOLIC PANEL      Result Value Range   Sodium 138  135 - 145 mEq/L   Potassium 3.5  3.5 - 5.1 mEq/L   Chloride 104  96 - 112 mEq/L   CO2 20  19 - 32 mEq/L   Glucose, Bld 140 (*) 70 - 99 mg/dL   BUN 8  6 - 23 mg/dL   Creatinine, Ser 1.61  0.50 - 1.10 mg/dL   Calcium 9.5  8.4 - 09.6 mg/dL   Total Protein 7.8  6.0 - 8.3 g/dL   Albumin 4.4  3.5 - 5.2 g/dL   AST 22  0 - 37 U/L   ALT 10  0 - 35 U/L   Alkaline Phosphatase 88  39 - 117 U/L   Total Bilirubin 0.3  0.3 - 1.2 mg/dL   GFR calc non Af Amer >90  >90 mL/min   GFR calc Af Amer >90  >90 mL/min  LIPASE, BLOOD      Result Value Range   Lipase 15  11 - 59 U/L  CBC      Result Value Range   WBC 10.3  4.0 - 10.5 K/uL   RBC 4.20  3.87 - 5.11 MIL/uL   Hemoglobin 13.6  12.0 - 15.0 g/dL   HCT 04.5  40.9 - 81.1 %   MCV 95.7  78.0 - 100.0 fL   MCH 32.4  26.0 - 34.0 pg   MCHC 33.8  30.0 - 36.0 g/dL   RDW 91.4  78.2 - 95.6 %   Platelets 253  150 - 400 K/uL  URINE MICROSCOPIC-ADD ON      Result Value  Range   Squamous Epithelial / LPF MANY (*) RARE   WBC, UA 0-2  <3 WBC/hpf   Bacteria, UA FEW (*) RARE   Ct Abdomen Pelvis W Contrast  03/09/2012  *RADIOLOGY REPORT*  Clinical Data: To our history of nausea and vomiting with diffuse abdominal pain.  Chills and body aches.  CT ABDOMEN AND PELVIS WITH CONTRAST  Technique:  Multidetector CT imaging of the abdomen and pelvis was performed following the standard protocol during bolus administration of intravenous contrast.  Contrast: OMNIPAQUE IOHEXOL 300 MG/ML  SOLN  Comparison: 12/09/2011  Findings: The lung bases are clear.  The liver, spleen, gallbladder, pancreas, adrenal glands, kidneys, abdominal aorta, and retroperitoneal lymph nodes are unremarkable. The stomach is decompressed.  There appears to be diffuse duodenal wall thickening which may be due to under distension but duodenitis may be present.  Jejunum and ileum are decompressed.  Stool filled colon without abnormal distension.  Scattered diverticula.  No free air or free fluid in the abdomen.  Pelvis:  The bladder is decompressed.  Uterus and ovaries are not enlarged.  No free or loculated pelvic fluid collections.  The appendix is normal.  No evidence of diverticulitis.  No significant pelvic lymphadenopathy.  Normal alignment of the lumbar vertebrae.  IMPRESSION: Suggestion of diffuse duodenal wall thickening which may represent duodenitis.  No other acute changes are identified.   Original Report Authenticated By: Burman Nieves, M.D.     Dx: Duodenitis.  Will admit for obs.  [facility], Team 8, obs, Dr. Doyne Keel, PA 03/10/12 [phone]

## 2012-03-10 NOTE — Discharge Summary (Deleted)
Physician Discharge Summary  Dawn Brock EAV:[id number] DOB: 06/15/1985 DOA: 03/09/2012  PCP: Default, Provider, MD  Admit date: 03/09/2012 Discharge date: 03/10/2012  Recommendations for Outpatient Follow-up:  1. F/U with a PCP of your choice as needed.  Discharge Diagnoses:  Principal Problem:    Duodenitis with nausea, vomiting, diarrhea and abdominal pain Active Problems:    Cigarette smoker    Alcohol abuse   Discharge Condition: Improved.  Diet recommendation: Regular.  History of present illness:  Dawn Brock is an 27 y.o. female the past medical history of alcohol abuse who was admitted to the hospital on 03/10/2012 with nausea, vomiting, and diarrhea. A CT scan was obtained on admission and showed possible duodenitis.  Hospital Course by problem:  Principal Problem:  Duodenitis with nausea, vomiting, diarrhea and abdominal pain  -Admitted for observation and supportive care. Treated with IV fluids, antinausea medications, and diet advanced as tolerated.  Active Problems:  Hypokalemia -Given 40 mEq oral replacement prior to discharge. Cigarette smoker  -Counseled on cessation.  Alcohol abuse  -Admitted to drinking a fifth of liquor daily. High risk for withdrawal phenomenon.  -Given Ativan per CIWA protocol.  -Psychiatry evaluation requested, but unable to evaluate her prior to discharge.  SW consulted for substance abuse counseling and to give the patient information regarding alcohol treatment options.   Procedures:  None.  Consultations:  Social work  Discharge Exam: Filed Vitals:   03/10/12 1406  BP: 154/67  Pulse: 50  Temp: 98.4 F (36.9 C)  Resp: 16   Filed Vitals:   03/10/12 0139 03/10/12 0435 03/10/12 0602 03/10/12 1406  BP: 128/81 136/84 124/74 154/67  Pulse: 55 62 74 50  Temp: 98.2 F (36.8 C) 98 F (36.7 C)  98.4 F (36.9 C)  TempSrc: Oral Oral    Resp: 16 14 18 16   Height: 5\' 7"  (1.702 m)     Weight: 78 kg (171  lb 15.3 oz)     SpO2: 100% 100% 100% 100%    Gen:  NAD Cardiovascular:  RRR, No M/R/G Respiratory: Lungs CTAB Gastrointestinal: Abdomen soft, NT/ND with normal active bowel sounds. Extremities: No C/E/C   Discharge Instructions  Discharge Orders   Future Orders Complete By Expires     Activity as tolerated - No restrictions  As directed     Call MD for:  persistant nausea and vomiting  As directed     Call MD for:  severe uncontrolled pain  As directed     Call MD for:  temperature >100.4  As directed     Diet general  As directed         Medication List    TAKE these medications       alum & mag hydroxide-simeth 200-200-20 MG/5ML suspension  Commonly known as:  MAALOX/MYLANTA  Take 30 mLs by mouth every 6 (six) hours as needed (dyspepsia).     amitriptyline 25 MG tablet  Commonly known as:  ELAVIL  Take 1 tablet (25 mg total) by mouth at bedtime.     multivitamin with minerals Tabs  Take 1 tablet by mouth daily.     ondansetron 4 MG tablet  Commonly known as:  ZOFRAN  Take 1 tablet (4 mg total) by mouth every 6 (six) hours as needed for nausea.     pantoprazole 40 MG tablet  Commonly known as:  PROTONIX  Take 1 tablet (40 mg total) by mouth daily.     potassium chloride 10 MEQ tablet  Commonly known as:  K-DUR  Take 1 tablet (10 mEq total) by mouth every morning.     promethazine 25 MG suppository  Commonly known as:  PHENERGAN  Place 1 suppository (25 mg total) rectally every 6 (six) hours as needed for nausea.          The results of significant diagnostics from this hospitalization (including imaging, microbiology, ancillary and laboratory) are listed below for reference.    Significant Diagnostic Studies: Ct Abdomen Pelvis W Contrast  03/09/2012  *RADIOLOGY REPORT*  Clinical Data: To our history of nausea and vomiting with diffuse abdominal pain.  Chills and body aches.  CT ABDOMEN AND PELVIS WITH CONTRAST  Technique:  Multidetector CT imaging of  the abdomen and pelvis was performed following the standard protocol during bolus administration of intravenous contrast.  Contrast: OMNIPAQUE IOHEXOL 300 MG/ML  SOLN  Comparison: 12/09/2011  Findings: The lung bases are clear.  The liver, spleen, gallbladder, pancreas, adrenal glands, kidneys, abdominal aorta, and retroperitoneal lymph nodes are unremarkable. The stomach is decompressed.  There appears to be diffuse duodenal wall thickening which may be due to under distension but duodenitis may be present.  Jejunum and ileum are decompressed.  Stool filled colon without abnormal distension.  Scattered diverticula.  No free air or free fluid in the abdomen.  Pelvis:  The bladder is decompressed.  Uterus and ovaries are not enlarged.  No free or loculated pelvic fluid collections.  The appendix is normal.  No evidence of diverticulitis.  No significant pelvic lymphadenopathy.  Normal alignment of the lumbar vertebrae.  IMPRESSION: Suggestion of diffuse duodenal wall thickening which may represent duodenitis.  No other acute changes are identified.   Original Report Authenticated By: Burman Nieves, M.D.     Microbiology: No results found for this or any previous visit (from the past 240 hour(s)).   Labs:  Basic Metabolic Panel:  Recent Labs Lab 03/09/12 1435 03/10/12 0544  NA 138 139  K 3.5 3.4*  CL 104 107  CO2 20 21  GLUCOSE 140* 115*  BUN 8 7  CREATININE 0.64 0.55  CALCIUM 9.5 8.9   GFR Estimated Creatinine Clearance: 112.7 ml/min (by C-G formula based on Cr of 0.55). Liver Function Tests:  Recent Labs Lab 03/09/12 1435  AST 22  ALT 10  ALKPHOS 88  BILITOT 0.3  PROT 7.8  ALBUMIN 4.4    Recent Labs Lab 03/09/12 1435  LIPASE 15   CBC:  Recent Labs Lab 03/09/12 1435 03/10/12 0544  WBC 10.3 8.9  HGB 13.6 12.9  HCT 40.2 38.1  MCV 95.7 96.5  PLT 253 228    Time coordinating discharge: 25 minutes.  Signed:  RAMA,CHRISTINA  Pager [phone] [facility] 03/10/2012, 3:59 PM

## 2012-03-10 NOTE — ED Provider Notes (Signed)
Medical screening examination/treatment/procedure(s) were conducted as a shared visit with non-physician practitioner(s) and myself.  I personally evaluated the patient during the encounter   Glynn Octave, MD 03/10/12 1146

## 2012-03-10 NOTE — Clinical Social Work Psychosocial (Unsigned)
     Clinical Social Work Department BRIEF PSYCHOSOCIAL ASSESSMENT 03/10/2012  Patient:  Dawn Brock, Dawn Brock     Account Number:  [id number]     Admit date:  03/09/2012  Clinical Social Worker:  Hattie Perch  Date/Time:  03/10/2012 12:00 M  Referred by:  Physician  Date Referred:  03/10/2012 Referred for  Substance Abuse   Other Referral:   Interview type:  Patient Other interview type:    PSYCHOSOCIAL DATA Living Status:  ALONE Admitted from facility:   Level of care:   Primary support name:   Primary support relationship to patient:   Degree of support available:   poor    CURRENT CONCERNS Current Concerns  Substance Abuse   Other Concerns:    SOCIAL WORK ASSESSMENT / PLAN CSW met with patient to discuss outpatient substance abuse resources. Patient was alert and oriented X3. Patient did not want to speak with CSW but accepted the resources list that CSW had brought without looking at CSW.   Assessment/plan status:   Other assessment/ plan:   Information/referral to community resources:   Gave outpatient substance abuse resources.    PATIENTS/FAMILYS RESPONSE TO PLAN OF CARE: patient not wanting to discuss her problem with CSW at this time but did reluctantly accept the list of places to seek help.

## 2012-03-10 NOTE — Progress Notes (Signed)
[facility] PROGRESS NOTE  ZUMA HUST YNW:[id number] DOB: [birth year] DOA: 03/09/2012 PCP: Default, Provider, MD  Brief narrative: Dawn Brock is an 27 y.o. female the past medical history of alcohol abuse who was admitted to the hospital on 03/10/2012 with nausea, vomiting, and diarrhea. A CT scan was obtained on admission and showed possible duodenitis.  Assessment/Plan: Principal Problem:   Duodenitis with nausea, vomiting, diarrhea and abdominal pain -Admitted for observation and supportive care. Continue IV fluids, antinausea medications, and advance diet as tolerated. Active Problems:   Cigarette smoker -Counseled on cessation.   Alcohol abuse -Admits to drinking a fifth of liquor daily. High risk for withdrawal phenomenon. -Continue Ativan per CIWA protocol. -Psychiatry evaluation requested.  Code Status: Full. Family Communication: None at bedside. Disposition Plan: Home when stable.   Medical Consultants:  Psychiatry  Other Consultants:  None.  Anti-infectives:  None.  HPI/Subjective: Dawn Brock is still not eating.  She denies further diarrhea.  Still nauseated.  No active vomiting.  Objective: Filed Vitals:   03/09/12 2054 03/10/12 0139 03/10/12 0435 03/10/12 0602  BP: 101/83 128/81 136/84 124/74  Pulse: 58 55 62 74  Temp: 98.2 F (36.8 C) 98.2 F (36.8 C) 98 F (36.7 C)   TempSrc: Oral Oral Oral   Resp: 18 16 14 18   Height:  5\' 7"  (1.702 m)    Weight:  78 kg (171 lb 15.3 oz)    SpO2: 100% 100% 100% 100%    Intake/Output Summary (Last 24 hours) at 03/10/12 1234 Last data filed at 03/10/12 1100  Gross per 24 hour  Intake    736 ml  Output    402 ml  Net    334 ml    Exam: Gen:  NAD Cardiovascular:  RRR, No M/R/G Respiratory:  Lungs CTAB Gastrointestinal:  Abdomen soft, NT/ND, + BS Extremities:  No C/E/C  Data Reviewed: Basic Metabolic Panel:  Recent Labs Lab 03/09/12 1435 03/10/12 0544  NA 138 139   K 3.5 3.4*  CL 104 107  CO2 20 21  GLUCOSE 140* 115*  BUN 8 7  CREATININE 0.64 0.55  CALCIUM 9.5 8.9   GFR Estimated Creatinine Clearance: 112.7 ml/min (by C-G formula based on Cr of 0.55). Liver Function Tests:  Recent Labs Lab 03/09/12 1435  AST 22  ALT 10  ALKPHOS 88  BILITOT 0.3  PROT 7.8  ALBUMIN 4.4    Recent Labs Lab 03/09/12 1435  LIPASE 15   CBC:  Recent Labs Lab 03/09/12 1435 03/10/12 0544  WBC 10.3 8.9  HGB 13.6 12.9  HCT 40.2 38.1  MCV 95.7 96.5  PLT 253 228     Procedures and Diagnostic Studies: Ct Abdomen Pelvis W Contrast  03/09/2012  *RADIOLOGY REPORT*  Clinical Data: To our history of nausea and vomiting with diffuse abdominal pain.  Chills and body aches.  CT ABDOMEN AND PELVIS WITH CONTRAST  Technique:  Multidetector CT imaging of the abdomen and pelvis was performed following the standard protocol during bolus administration of intravenous contrast.  Contrast: OMNIPAQUE IOHEXOL 300 MG/ML  SOLN  Comparison: 12/09/2011  Findings: The lung bases are clear.  The liver, spleen, gallbladder, pancreas, adrenal glands, kidneys, abdominal aorta, and retroperitoneal lymph nodes are unremarkable. The stomach is decompressed.  There appears to be diffuse duodenal wall thickening which may be due to under distension but duodenitis may be present.  Jejunum and ileum are decompressed.  Stool filled colon without abnormal distension.  Scattered diverticula.  No  free air or free fluid in the abdomen.  Pelvis:  The bladder is decompressed.  Uterus and ovaries are not enlarged.  No free or loculated pelvic fluid collections.  The appendix is normal.  No evidence of diverticulitis.  No significant pelvic lymphadenopathy.  Normal alignment of the lumbar vertebrae.  IMPRESSION: Suggestion of diffuse duodenal wall thickening which may represent duodenitis.  No other acute changes are identified.   Original Report Authenticated By: Burman Nieves, M.D.      Scheduled Meds: . amitriptyline  25 mg Oral QHS  . folic acid  1 mg Oral Daily  . multivitamin with minerals  1 tablet Oral Daily  . potassium chloride  40 mEq Oral Once  . thiamine  100 mg Oral Daily   Or  . thiamine  100 mg Intravenous Daily   Continuous Infusions: . sodium chloride 125 mL/hr at 03/10/12 0810    Time spent: 25 minutes.   LOS: 1 day   Maika Mcelveen  [facility] Pager [phone].  If 8PM-8AM, please contact night-coverage at [email or web address], password [facility] 03/10/2012, 12:34 PM

## 2012-03-11 DIAGNOSIS — F1220 Cannabis dependence, uncomplicated: Secondary | ICD-10-CM

## 2012-03-11 NOTE — Progress Notes (Signed)
Pt c/o of chest pain. Paged Dr. Darnelle Catalan. MD recommended continuing with morphine, feels that pt's pain is related to duodenitis and abdominal pain.

## 2012-03-11 NOTE — Progress Notes (Signed)
[facility] PROGRESS NOTE  JEANET LUPE YNW:[id number] DOB: 04/11/1985 DOA: 03/09/2012 PCP: Default, Provider, MD  Brief narrative: Dawn Brock is an 27 y.o. female the past medical history of alcohol abuse who was admitted to the hospital on 03/10/2012 with nausea, vomiting, and diarrhea. A CT scan was obtained on admission and showed possible duodenitis.  Assessment/Plan: Principal Problem:   Duodenitis with nausea, vomiting, diarrhea and abdominal pain -Admitted for observation and supportive care. Continue IV fluids, antinausea medications, and advance diet as tolerated. -Attempted to advance diet yesterday, but the patient had several episodes of emesis, and is now n.p.o. except for ice chips. We'll try clear liquids. -Continue Reglan and monitor for resolution of symptoms. Active Problems:   Cigarette smoker -Counseled on cessation.   Alcohol abuse -Admits to drinking a fifth of liquor daily. Has now become tremulous and is experiencing withdrawal phenomenon. -Continue Ativan per CIWA protocol. -Psychiatry evaluation requested.  Code Status: Full. Family Communication: None at bedside. Disposition Plan: Home when stable.   Medical Consultants:  Psychiatry  Other Consultants:  None.  Anti-infectives:  None.  HPI/Subjective: Dawn Brock is still having episodes of nausea. She vomited several times yesterday. No complaints of diarrhea. Nursing reports occasional agitation and tremulousness.  Objective: Filed Vitals:   03/10/12 0602 03/10/12 1406 03/10/12 2240 03/11/12 0630  BP: 124/74 154/67 106/58 106/70  Pulse: 74 50 62 78  Temp:  98.4 F (36.9 C) 98.7 F (37.1 C) 98.2 F (36.8 C)  TempSrc:    Oral  Resp: 18 16 18 20   Height:      Weight:      SpO2: 100% 100% 99% 100%   No intake or output data in the 24 hours ending 03/11/12 1137  Exam: Gen:  NAD Cardiovascular:  RRR, No M/R/G Respiratory:  Lungs  CTAB Gastrointestinal:  Abdomen soft, NT/ND, + BS Extremities:  No C/E/C  Data Reviewed: Basic Metabolic Panel:  Recent Labs Lab 03/09/12 1435 03/10/12 0544  NA 138 139  K 3.5 3.4*  CL 104 107  CO2 20 21  GLUCOSE 140* 115*  BUN 8 7  CREATININE 0.64 0.55  CALCIUM 9.5 8.9   GFR Estimated Creatinine Clearance: 112.7 ml/min (by C-G formula based on Cr of 0.55). Liver Function Tests:  Recent Labs Lab 03/09/12 1435  AST 22  ALT 10  ALKPHOS 88  BILITOT 0.3  PROT 7.8  ALBUMIN 4.4    Recent Labs Lab 03/09/12 1435  LIPASE 15   CBC:  Recent Labs Lab 03/09/12 1435 03/10/12 0544  WBC 10.3 8.9  HGB 13.6 12.9  HCT 40.2 38.1  MCV 95.7 96.5  PLT 253 228     Procedures and Diagnostic Studies: Ct Abdomen Pelvis W Contrast  03/09/2012  *RADIOLOGY REPORT*  Clinical Data: To our history of nausea and vomiting with diffuse abdominal pain.  Chills and body aches.  CT ABDOMEN AND PELVIS WITH CONTRAST  Technique:  Multidetector CT imaging of the abdomen and pelvis was performed following the standard protocol during bolus administration of intravenous contrast.  Contrast: OMNIPAQUE IOHEXOL 300 MG/ML  SOLN  Comparison: 12/09/2011  Findings: The lung bases are clear.  The liver, spleen, gallbladder, pancreas, adrenal glands, kidneys, abdominal aorta, and retroperitoneal lymph nodes are unremarkable. The stomach is decompressed.  There appears to be diffuse duodenal wall thickening which may be due to under distension but duodenitis may be present.  Jejunum and ileum are decompressed.  Stool filled colon without abnormal distension.  Scattered  diverticula.  No free air or free fluid in the abdomen.  Pelvis:  The bladder is decompressed.  Uterus and ovaries are not enlarged.  No free or loculated pelvic fluid collections.  The appendix is normal.  No evidence of diverticulitis.  No significant pelvic lymphadenopathy.  Normal alignment of the lumbar vertebrae.  IMPRESSION: Suggestion  of diffuse duodenal wall thickening which may represent duodenitis.  No other acute changes are identified.   Original Report Authenticated By: Burman Nieves, M.D.     Scheduled Meds: . amitriptyline  25 mg Oral QHS  . folic acid  1 mg Oral Daily  . metoCLOPramide (REGLAN) injection  5 mg Intravenous Q6H  . multivitamin with minerals  1 tablet Oral Daily  . pantoprazole (PROTONIX) IV  40 mg Intravenous Daily  . thiamine  100 mg Oral Daily   Or  . thiamine  100 mg Intravenous Daily   Continuous Infusions: . sodium chloride 125 mL/hr at 03/11/12 1001    Time spent: 25 minutes.   LOS: 2 days   [name]  [facility] Pager [phone].  If 8PM-8AM, please contact night-coverage at [email or web address], password [email or web address] 03/11/2012, 11:37 AM

## 2012-03-11 NOTE — Progress Notes (Addendum)
Per discussion with psychiatrist, concern regarding safety of patient children. CSW met with pt at bedside. Pt states that patient children that are 5 and 7 are with pt cousin. Pt states that pt aunt and uncle live up the street from the patient. Pt states that pt children grandmother and father also provide care and support for patient children. Pt gave csw permission to contact family regarding patient children. CSW spoke with Mr. Alford patient uncle who helped raise patient. Per Mr. Alford patient aunt and uncle are assisting with pt children and their son Francee Piccolo, who is 32 is also provided care for patient children at patient house. CSW discussed case with psychiatrist. Per psychiatrist patient has been drinking 1/5 of liquor per day and unable to care for self. Psychiatry and csw have concern regarding patient ability to care for patient children on a day to day basis. CSW made cps report.   Catha Gosselin, LCSWA  [phone] .03/11/2012 1637pm

## 2012-03-11 NOTE — ED Provider Notes (Signed)
Medical screening examination/treatment/procedure(s) were performed by non-physician practitioner and as supervising physician I was immediately available for consultation/collaboration.   Loren Racer, MD 03/11/12 [phone]

## 2012-03-11 NOTE — Consult Note (Signed)
Patient Identification:  Dawn Brock Date of Evaluation:  03/11/2012 Reason for Consult:  Alcohol, lack of Self Care  Referring Provider: Dr. Darnelle Catalan  History of Present Illness:This 27 yo woman, mother of two children son, age 61 and daughter age 60, came to ED with c/o nausea vomiting, and was admitted with diagnosis of duodenitis.   Past Psychiatric History:Came to ED  With same symptoms: 02/01/2012   Past Medical History:     Past Medical History  Diagnosis Date  . Abnormal Pap smear   . Urinary tract infection   . Gastritis        Past Surgical History  Procedure Laterality Date  . Leep    . Leep    . Hernia repair      As an infant    Allergies: No Known Allergies  Current Medications:  Prior to Admission medications   Medication Sig Start Date End Date Taking? Authorizing Provider  alum & mag hydroxide-simeth (MAALOX/MYLANTA) 200-200-20 MG/5ML suspension Take 30 mLs by mouth every 6 (six) hours as needed (dyspepsia). 01/28/12   Pamella Pert, MD  amitriptyline (ELAVIL) 25 MG tablet Take 1 tablet (25 mg total) by mouth at bedtime. 02/28/12   Rodolph Bong, MD  Multiple Vitamin (MULTIVITAMIN WITH MINERALS) TABS Take 1 tablet by mouth daily. 03/10/12   Maryruth Bun Rama, MD  ondansetron (ZOFRAN) 4 MG tablet Take 1 tablet (4 mg total) by mouth every 6 (six) hours as needed for nausea. 01/28/12   Pamella Pert, MD  pantoprazole (PROTONIX) 40 MG tablet Take 1 tablet (40 mg total) by mouth daily. 01/28/12   Pamella Pert, MD  potassium chloride (K-DUR) 10 MEQ tablet Take 1 tablet (10 mEq total) by mouth every morning. 01/28/12   Pamella Pert, MD  promethazine (PHENERGAN) 25 MG suppository Place 1 suppository (25 mg total) rectally every 6 (six) hours as needed for nausea. 03/09/12   Arnoldo Hooker, PA-C    Social History:    reports that she has been smoking Cigarettes.  She has a 14 pack-year smoking history. She has never used smokeless tobacco. She reports that she drinks  about 7.2 ounces of alcohol per week. She reports that she does not use illicit drugs.   Family History:    Family History  Problem Relation Age of Onset  . Hypertension Mother   . Cirrhosis Father     Mental Status Examination/Evaluation: Objective:  Appearance: Disheveled and lying on stomach  Eye Contact::  Minimal  Speech:  minimal  Volume:  Decreased  Mood:  miserable  Affect:  Congruent  Thought Process:  Disorganized and does not want to talk  Orientation:  NA  Thought Content:  NA  Suicidal Thoughts:  No  Homicidal Thoughts:  No  Judgement:  Impaired  Insight:  Lacking   DIAGNOSIS:   AXIS I  Alcohol Dependence, cannabis dependence, ,   AXIS II  Deferred  AXIS III See medical notes.  AXIS IV economic problems, educational problems, other psychosocial or environmental problems, problems related to legal system/crime, problems related to social environment, problems with primary support group and disregard for childrsn's welfare  AXIS V 31-40 impairment in reality testing   Assessment/Plan:  Dr. Darnelle Catalan,  Psych CSW Pt is lying on abdomen, unwilling to open eyes and respond although awake.  She volunteers minimal information and has a sudden attack of nausea.  She has been here since [place]. And detox was initiated.  There is concern that she drinks more than  she is willing to say since she has such a severe period of detox She also has a positive cannabis screen.  The request for consultation also includes inability to care for self - which is reflected in her affect and disposition; given she is feeling so sick.  She had a spell of vomiting and fell asleep again.  She has two children ages 3 and 7 being cared for by her cousin.  She says she drank a fifth of Vodka, but not willing discussed her habit of drinking nor did she mention the cannabis. *Pt has similar presentation 02/01/12 RECOMMENDATION:  1.  Pt does not have capacity to engage in MSE 2.  Concern registers for two  children whose mother has drunk enough Vodka[alcohol] to be this ill& 02/01/12 3.  Suggest CSW contact CPS to report concern for children's well-being; exposure to alcohol and cannabis 4.  Will follow when detox is complete. Itsel Opfer MD 03/11/2012 9:45 AM

## 2012-03-12 LAB — CBC
MCH: 32.1 pg (ref 26.0–34.0)
MCHC: 33.3 g/dL (ref 30.0–36.0)
Platelets: 181 10*3/uL (ref 150–400)

## 2012-03-12 LAB — BASIC METABOLIC PANEL
Calcium: 7.9 mg/dL — ABNORMAL LOW (ref 8.4–10.5)
GFR calc Af Amer: >90 mL/min (ref >90)
GFR calc non Af Amer: >90 mL/min (ref >90)
Sodium: 138 mEq/L (ref 135–145)

## 2012-03-12 NOTE — Progress Notes (Signed)
Patient discharged to home with husband, son and daughter present. Patient expressed understanding of the need for her to obtain a PCP and follow-up to prevent further problems. Her attitude expressed as hopeful. Family Protective Services to assess home situation after she returns home.

## 2012-03-12 NOTE — Discharge Summary (Signed)
Physician Discharge Summary  Dawn Brock:[id number] DOB: [birth year] DOA: 03/09/2012  PCP: Default, Provider, MD  Admit date: 03/09/2012 Discharge date: 03/12/2012  Recommendations for Outpatient Follow-up:  1. Patient does not currently have a primary care provider. 2. The patient was provided with a list of community resources for alcohol abuse treatment. 3. Social worker has contacted child protective services for an evaluation of her home situation and the safety of her children.  Discharge Diagnoses:  Principal Problem:    Duodenitis with nausea, vomiting, diarrhea and abdominal pain Active Problems:    Hypokalemia    Cigarette smoker    Alcohol abuse / Alcohol dependence    Cannabis dependence    Executive function deficit   Discharge Condition: Improved.  Diet recommendation: Regular (bland) as tolerated.  History of present illness:  Dawn Brock is an 27 y.o. female the past medical history of alcohol abuse who was admitted to the hospital on 03/10/2012 with nausea, vomiting, and diarrhea. A CT scan was obtained on admission and showed possible duodenitis.  Hospital Course by problem:  Principal Problem:  Duodenitis with nausea, vomiting, diarrhea and abdominal pain  -Admitted for observation and supportive care. Continued on IV fluids, antinausea medications, and diet advanced as tolerated.  Active Problems:  Cigarette smoker  -Counseled on cessation.  Alcohol abuse and dependence / cannabis dependence  -Admits to drinking a fifth of liquor daily. Developed withdrawal phenomenon which was treated with Ativan per CIWA protocol.  -Psychiatry evaluation performed by Dr. Ferol Luz on 03/11/12.   - Provided with community resources for help with substance abuse.   Consultations:  Dr. Mickeal Skinner, psychiatry.  Discharge Exam: Filed Vitals:   03/12/12 0558  BP: 123/67  Pulse: 65  Temp: 98.2 F (36.8 C)  Resp: 16   Filed Vitals:    03/11/12 1750 03/11/12 2113 03/11/12 2207 03/12/12 0558  BP: 151/78 141/81 95/60 123/67  Pulse: 51 77 83 65  Temp: 98.3 F (36.8 C) 98.6 F (37 C) 97.7 F (36.5 C) 98.2 F (36.8 C)  TempSrc: Oral Oral Oral Oral  Resp: 16 18 16 16   Height:      Weight:      SpO2: 100% 100% 98% 100%    Gen:  NAD Cardiovascular:  RRR, No M/R/G Respiratory: Lungs CTAB Gastrointestinal: Abdomen soft, NT/ND with normal active bowel sounds. Extremities: No C/E/C   Discharge Instructions  Discharge Orders   Future Orders Complete By Expires     Activity as tolerated - No restrictions  As directed     Call MD for:  persistant nausea and vomiting  As directed     Call MD for:  severe uncontrolled pain  As directed     Call MD for:  temperature >100.4  As directed     Diet general  As directed         Medication List    TAKE these medications       alum & mag hydroxide-simeth 200-200-20 MG/5ML suspension  Commonly known as:  MAALOX/MYLANTA  Take 30 mLs by mouth every 6 (six) hours as needed (dyspepsia).     amitriptyline 25 MG tablet  Commonly known as:  ELAVIL  Take 1 tablet (25 mg total) by mouth at bedtime.     multivitamin with minerals Tabs  Take 1 tablet by mouth daily.     ondansetron 4 MG tablet  Commonly known as:  ZOFRAN  Take 1 tablet (4 mg total) by mouth every 6 (six) hours  as needed for nausea.     pantoprazole 40 MG tablet  Commonly known as:  PROTONIX  Take 1 tablet (40 mg total) by mouth daily.     potassium chloride 10 MEQ tablet  Commonly known as:  K-DUR  Take 1 tablet (10 mEq total) by mouth every morning.     promethazine 25 MG suppository  Commonly known as:  PHENERGAN  Place 1 suppository (25 mg total) rectally every 6 (six) hours as needed for nausea.           Follow-up Information   Follow up with A PCP of your choice. (As needed)        The results of significant diagnostics from this hospitalization (including imaging, microbiology,  ancillary and laboratory) are listed below for reference.    Significant Diagnostic Studies: Ct Abdomen Pelvis W Contrast  03/09/2012  *RADIOLOGY REPORT*  Clinical Data: To our history of nausea and vomiting with diffuse abdominal pain.  Chills and body aches.  CT ABDOMEN AND PELVIS WITH CONTRAST  Technique:  Multidetector CT imaging of the abdomen and pelvis was performed following the standard protocol during bolus administration of intravenous contrast.  Contrast: OMNIPAQUE IOHEXOL 300 MG/ML  SOLN  Comparison: 12/09/2011  Findings: The lung bases are clear.  The liver, spleen, gallbladder, pancreas, adrenal glands, kidneys, abdominal aorta, and retroperitoneal lymph nodes are unremarkable. The stomach is decompressed.  There appears to be diffuse duodenal wall thickening which may be due to under distension but duodenitis may be present.  Jejunum and ileum are decompressed.  Stool filled colon without abnormal distension.  Scattered diverticula.  No free air or free fluid in the abdomen.  Pelvis:  The bladder is decompressed.  Uterus and ovaries are not enlarged.  No free or loculated pelvic fluid collections.  The appendix is normal.  No evidence of diverticulitis.  No significant pelvic lymphadenopathy.  Normal alignment of the lumbar vertebrae.  IMPRESSION: Suggestion of diffuse duodenal wall thickening which may represent duodenitis.  No other acute changes are identified.   Original Report Authenticated By: Burman Nieves, M.D.    Labs:  Basic Metabolic Panel:  Recent Labs Lab 03/09/12 1435 03/10/12 0544 03/12/12 0547  NA 138 139 138  K 3.5 3.4* 3.1*  CL 104 107 108  CO2 20 21 24   GLUCOSE 140* 115* 95  BUN 8 7 7   CREATININE 0.64 0.55 0.71  CALCIUM 9.5 8.9 7.9*   GFR Estimated Creatinine Clearance: 112.7 ml/min (by C-G formula based on Cr of 0.71). Liver Function Tests:  Recent Labs Lab 03/09/12 1435  AST 22  ALT 10  ALKPHOS 88  BILITOT 0.3  PROT 7.8  ALBUMIN 4.4     Recent Labs Lab 03/09/12 1435  LIPASE 15   CBC:  Recent Labs Lab 03/09/12 1435 03/10/12 0544 03/12/12 0547  WBC 10.3 8.9 8.6  HGB 13.6 12.9 11.0*  HCT 40.2 38.1 33.0*  MCV 95.7 96.5 96.2  PLT 253 228 181    Time coordinating discharge: 25 minutes.  Signed:  Batu Cassin  Pager [phone] [facility] 03/12/2012, 10:50 AM

## 2012-04-03 ENCOUNTER — Encounter (HOSPITAL_COMMUNITY): Payer: Self-pay | Admitting: *Deleted

## 2012-04-03 ENCOUNTER — Emergency Department (HOSPITAL_COMMUNITY)
Admission: EM | Admit: 2012-04-03 | Discharge: 2012-04-04 | Disposition: A | Discharge status: Home / Self Care | Payer: Medicaid Other | Attending: Emergency Medicine | Admitting: Emergency Medicine

## 2012-04-03 DIAGNOSIS — Z87440 Personal history of urinary (tract) infections: Secondary | ICD-10-CM | POA: Insufficient documentation

## 2012-04-03 DIAGNOSIS — F17200 Nicotine dependence, unspecified, uncomplicated: Secondary | ICD-10-CM | POA: Insufficient documentation

## 2012-04-03 DIAGNOSIS — R1110 Vomiting, unspecified: Secondary | ICD-10-CM | POA: Insufficient documentation

## 2012-04-03 DIAGNOSIS — Z3202 Encounter for pregnancy test, result negative: Secondary | ICD-10-CM | POA: Insufficient documentation

## 2012-04-03 DIAGNOSIS — R109 Unspecified abdominal pain: Secondary | ICD-10-CM | POA: Insufficient documentation

## 2012-04-03 LAB — CBC WITH DIFFERENTIAL/PLATELET
Eosinophils Absolute: 0 10*3/uL (ref 0.0–0.7)
Lymphocytes Relative: 21 % (ref 12–46)
Lymphs Abs: 1.8 10*3/uL (ref 0.7–4.0)
Neutrophils Relative %: 75 % (ref 43–77)
Platelets: 220 10*3/uL (ref 150–400)
RBC: 4.47 MIL/uL (ref 3.87–5.11)
WBC: 8.6 10*3/uL (ref 4.0–10.5)

## 2012-04-03 LAB — URINALYSIS, ROUTINE W REFLEX MICROSCOPIC
Bilirubin Urine: NEGATIVE
Glucose, UA: NEGATIVE mg/dL
Hgb urine dipstick: NEGATIVE
Specific Gravity, Urine: 1.033 — ABNORMAL HIGH (ref 1.005–1.030)
Urobilinogen, UA: 1 mg/dL (ref 0.0–1.0)

## 2012-04-03 LAB — COMPREHENSIVE METABOLIC PANEL
ALT: 6 U/L (ref 0–35)
Alkaline Phosphatase: 86 U/L (ref 39–117)
CO2: 23 mEq/L (ref 19–32)
GFR calc Af Amer: >90 mL/min (ref >90)
Glucose, Bld: 112 mg/dL — ABNORMAL HIGH (ref 70–99)
Potassium: 3.7 mEq/L (ref 3.5–5.1)
Sodium: 137 mEq/L (ref 135–145)
Total Protein: 8.4 g/dL — ABNORMAL HIGH (ref 6.0–8.3)

## 2012-04-03 LAB — RAPID URINE DRUG SCREEN, HOSP PERFORMED
Amphetamines: NOT DETECTED
Benzodiazepines: NOT DETECTED
Cocaine: POSITIVE — AB
Opiates: NOT DETECTED

## 2012-04-03 LAB — URINE MICROSCOPIC-ADD ON

## 2012-04-03 LAB — ETHANOL: Alcohol, Ethyl (B): <11 mg/dL (ref 0–11)

## 2012-04-03 MED ORDER — METOCLOPRAMIDE HCL 5 MG/ML IJ SOLN
10.0000 mg | Freq: Once | INTRAMUSCULAR | Status: AC
  Administered 2012-04-03: 10 mg via INTRAVENOUS
  Filled 2012-04-03: qty 2

## 2012-04-03 MED ORDER — ONDANSETRON HCL 4 MG/2ML IJ SOLN
4.0000 mg | Freq: Once | INTRAMUSCULAR | Status: AC
  Administered 2012-04-03: 4 mg via INTRAVENOUS
  Filled 2012-04-03: qty 2

## 2012-04-03 NOTE — ED Provider Notes (Signed)
History     CSN: [id number]  Arrival date & time 04/03/12  2055   First MD Initiated Contact with Patient 04/03/12 2253      Chief Complaint  Patient presents with  . Emesis    (Consider location/radiation/quality/duration/timing/severity/associated sxs/prior treatment) HPI Comments: Acute onset of vomiting.  This morning.  Has been persistent throughout the day.  Tonight.  She has developed some upper abdominal cramping just prior to emesis.  That is relieved after she vomits.  Denies any dysuria, diarrhea, previous history of same  Patient is a 27 y.o. female presenting with vomiting. The history is provided by the patient.  Emesis Severity:  Moderate Duration:  12 hours Timing:  Intermittent Quality:  Bilious material Able to tolerate:  Liquids How soon after eating does vomiting occur:  30 minutes Progression:  Unchanged Chronicity:  New Relieved by:  Nothing Worsened by:  Nothing tried Ineffective treatments:  None tried Associated symptoms: abdominal pain   Associated symptoms: no arthralgias, no chills, no cough, no diarrhea, no fever and no URI     Past Medical History  Diagnosis Date  . Abnormal Pap smear   . Urinary tract infection   . Gastritis     Past Surgical History  Procedure Laterality Date  . Leep    . Leep    . Hernia repair      As an infant    Family History  Problem Relation Age of Onset  . Hypertension Mother   . Cirrhosis Father     History  Substance Use Topics  . Smoking status: Current Every Day Smoker -- 1.00 packs/day for 14 years    Types: Cigarettes  . Smokeless tobacco: Never Used  . Alcohol Use: 7.2 oz/week    12 Glasses of wine per week     Comment: Weekends    OB History   Grav Para Term Preterm Abortions TAB SAB Ect Mult Living   2 2 0 0 0 0 0 0 0 2       Review of Systems  Constitutional: Negative for fever and chills.  Gastrointestinal: Positive for nausea, vomiting and abdominal pain. Negative for diarrhea,  constipation, blood in stool and abdominal distention.  Genitourinary: Negative for dysuria and vaginal discharge.  Musculoskeletal: Negative for arthralgias.  Neurological: Negative for weakness.  All other systems reviewed and are negative.    Allergies  Review of patient's allergies indicates no known allergies.  Home Medications   Current Outpatient Rx  Name  Route  Sig  Dispense  Refill  . ondansetron (ZOFRAN) 4 MG tablet   Oral   Take 1 tablet (4 mg total) by mouth every 8 (eight) hours as needed for nausea.   10 tablet   0     BP 117/83  Pulse 65  Temp(Src) 98.2 F (36.8 C) (Oral)  Resp 17  SpO2 98%  Physical Exam  Nursing note and vitals reviewed. Constitutional: She is oriented to person, place, and time. She appears well-developed and well-nourished.  HENT:  Head: Normocephalic.  Eyes: Pupils are equal, round, and reactive to light.  Neck: Normal range of motion.  Cardiovascular: Normal rate.   Pulmonary/Chest: Effort normal.  Abdominal: Soft. She exhibits no distension. There is tenderness in the right upper quadrant, epigastric area and left upper quadrant. There is no rigidity and negative Murphy's sign.  Musculoskeletal: Normal range of motion.  Neurological: She is alert and oriented to person, place, and time.  Skin: Skin is warm and dry.  ED Course  Procedures (including critical care time)  Labs Reviewed  COMPREHENSIVE METABOLIC PANEL - Abnormal; Notable for the following:    Glucose, Bld 112 (*)    Total Protein 8.4 (*)    All other components within normal limits  URINALYSIS, ROUTINE W REFLEX MICROSCOPIC - Abnormal; Notable for the following:    APPearance CLOUDY (*)    Specific Gravity, Urine 1.033 (*)    Ketones, ur >80 (*)    Protein, ur 30 (*)    Leukocytes, UA TRACE (*)    All other components within normal limits  URINE RAPID DRUG SCREEN (HOSP PERFORMED) - Abnormal; Notable for the following:    Cocaine POSITIVE (*)     Tetrahydrocannabinol POSITIVE (*)    All other components within normal limits  URINE MICROSCOPIC-ADD ON - Abnormal; Notable for the following:    Squamous Epithelial / LPF FEW (*)    All other components within normal limits  CBC WITH DIFFERENTIAL  LIPASE, BLOOD  PREGNANCY, URINE  ETHANOL   No results found.   1. Vomiting       MDM  Patient has had persistent vomiting.  She is receiving her second liter of fluid.  At this time.  She is comfortable until trying.  By mouth challenge.  We'll continue hydration and attempt.  By mouth challenge in several hours         Arman Filter, NP 04/04/12 1957  Arman Filter, NP 04/04/12 [phone]

## 2012-04-03 NOTE — ED Notes (Signed)
Pt is diaphoretic and c/o intense abdominal pain.  IV started.  MD notified of concerns

## 2012-04-03 NOTE — ED Notes (Signed)
[id number]<JY> Expected date:<BR> Expected time:<BR> Means of arrival:<BR> Comments:<BR> Triage

## 2012-04-03 NOTE — ED Notes (Signed)
[id number]> Expected date:<BR> Expected time:<BR> Means of arrival:<BR> Comments:<BR> EMS/26 yo female with N/V-Zofran IM per EMS

## 2012-04-03 NOTE — ED Notes (Signed)
N/v/d since this morning; vomiting on arrival; zofran IM given en route

## 2012-04-04 MED ORDER — HYDROCODONE-ACETAMINOPHEN 5-325 MG PO TABS
1.0000 | ORAL_TABLET | Freq: Once | ORAL | Status: AC
  Administered 2012-04-04: 1 via ORAL
  Filled 2012-04-04: qty 1

## 2012-04-04 MED ORDER — SODIUM CHLORIDE 0.9 % IV SOLN: Freq: Once | INTRAVENOUS | Status: DC

## 2012-04-04 MED ORDER — ONDANSETRON HCL 4 MG/2ML IJ SOLN
4.0000 mg | Freq: Once | INTRAMUSCULAR | Status: AC
  Administered 2012-04-04: 4 mg via INTRAVENOUS
  Filled 2012-04-04: qty 2

## 2012-04-04 MED ORDER — FAMOTIDINE 20 MG PO TABS
20.0000 mg | ORAL_TABLET | Freq: Once | ORAL | Status: AC
  Administered 2012-04-04: 20 mg via ORAL
  Filled 2012-04-04: qty 1

## 2012-04-04 MED ORDER — ONDANSETRON HCL 4 MG PO TABS: 4.0000 mg | ORAL_TABLET | Freq: Three times a day (TID) | ORAL | Status: DC | PRN

## 2012-04-04 MED ORDER — MORPHINE SULFATE 4 MG/ML IJ SOLN
4.0000 mg | Freq: Once | INTRAMUSCULAR | Status: AC
  Administered 2012-04-04: 4 mg via INTRAVENOUS
  Filled 2012-04-04: qty 1

## 2012-04-04 NOTE — ED Notes (Signed)
Pt c/o of dry mouth and wanting something to drink. Gave pt Biotene mouth wash to swish and spit to help relieve her dry mouth since pt failed her Fluid Challenge.  Pt also c/o of new onset mid sternum pain, gave EKG to [facility].

## 2012-04-04 NOTE — ED Notes (Addendum)
Attempted to do a fluid challenge with pt. Pt was given 2 oz of soda and encouraged to sip it slowly. Pt drank soda fast and immediately vomited it back up. NP made aware Campos-Garcia, Tresa Endo

## 2012-04-04 NOTE — ED Provider Notes (Signed)
This is a 27 year old female with vomiting.  Patient was signed out to me by Tomasa Blase, NP.  Plan at sign out is to continue hydration and to PO challenge.  As soon as patient can pass PO challenge, she may be discharged.  Abdomen is benign, without any focal tenderness.  7:29 AM Patient complains of chest pain.  Discussed with Dr. Patria Mane.  Could be vasospasm from cocaine use.  Tells me that troponin is not indicated.  Will proceed with original plan of PO challenge and fluids.  ED ECG REPORT  I personally interpreted this EKG   Date: 04/04/2012   Rate: 54  Rhythm: normal sinus rhythm  QRS Axis: normal  Intervals: normal  ST/T Wave abnormalities: nonspecific T wave changes  Conduction Disutrbances:none  Narrative Interpretation:   Old EKG Reviewed: unchanged  8:23 AM Patient requesting pain medicine.  Will give 1 tab Vicodin.    8:30 AM Patient has tolerated PO fluids.  Patient states that she does not want to go home, however, there is not medical indication for keeping her in the hospital.  Her abdomen is benign.  She is afebrile, not in any apparent distress and is tolerating PO fluids.  Will discharge with PCP follow up.  Recommended to the patient that she quit using drugs.   Results for orders placed during the hospital encounter of 04/03/12  CBC WITH DIFFERENTIAL      Result Value Range   WBC 8.6  4.0 - 10.5 K/uL   RBC 4.47  3.87 - 5.11 MIL/uL   Hemoglobin 14.5  12.0 - 15.0 g/dL   HCT 54.0  98.1 - 19.1 %   MCV 93.7  78.0 - 100.0 fL   MCH 32.4  26.0 - 34.0 pg   MCHC 34.6  30.0 - 36.0 g/dL   RDW 47.8  29.5 - 62.1 %   Platelets 220  150 - 400 K/uL   Neutrophils Relative 75  43 - 77 %   Neutro Abs 6.4  1.7 - 7.7 K/uL   Lymphocytes Relative 21  12 - 46 %   Lymphs Abs 1.8  0.7 - 4.0 K/uL   Monocytes Relative 4  3 - 12 %   Monocytes Absolute 0.4  0.1 - 1.0 K/uL   Eosinophils Relative 0  0 - 5 %   Eosinophils Absolute 0.0  0.0 - 0.7 K/uL   Basophils Relative 0  0 - 1 %   Basophils Absolute 0.0  0.0 - 0.1 K/uL  COMPREHENSIVE METABOLIC PANEL      Result Value Range   Sodium 137  135 - 145 mEq/L   Potassium 3.7  3.5 - 5.1 mEq/L   Chloride 102  96 - 112 mEq/L   CO2 23  19 - 32 mEq/L   Glucose, Bld 112 (*) 70 - 99 mg/dL   BUN 9  6 - 23 mg/dL   Creatinine, Ser 3.08  0.50 - 1.10 mg/dL   Calcium 9.4  8.4 - 65.7 mg/dL   Total Protein 8.4 (*) 6.0 - 8.3 g/dL   Albumin 4.4  3.5 - 5.2 g/dL   AST 20  0 - 37 U/L   ALT 6  0 - 35 U/L   Alkaline Phosphatase 86  39 - 117 U/L   Total Bilirubin 0.5  0.3 - 1.2 mg/dL   GFR calc non Af Amer >90  >90 mL/min   GFR calc Af Amer >90  >90 mL/min  LIPASE, BLOOD  Result Value Range   Lipase 17  11 - 59 U/L  URINALYSIS, ROUTINE W REFLEX MICROSCOPIC      Result Value Range   Color, Urine YELLOW  YELLOW   APPearance CLOUDY (*) CLEAR   Specific Gravity, Urine 1.033 (*) 1.005 - 1.030   pH 7.5  5.0 - 8.0   Glucose, UA NEGATIVE  NEGATIVE mg/dL   Hgb urine dipstick NEGATIVE  NEGATIVE   Bilirubin Urine NEGATIVE  NEGATIVE   Ketones, ur >80 (*) NEGATIVE mg/dL   Protein, ur 30 (*) NEGATIVE mg/dL   Urobilinogen, UA 1.0  0.0 - 1.0 mg/dL   Nitrite NEGATIVE  NEGATIVE   Leukocytes, UA TRACE (*) NEGATIVE  PREGNANCY, URINE      Result Value Range   Preg Test, Ur NEGATIVE  NEGATIVE  ETHANOL      Result Value Range   Alcohol, Ethyl (B) <11  0 - 11 mg/dL  URINE RAPID DRUG SCREEN (HOSP PERFORMED)      Result Value Range   Opiates NONE DETECTED  NONE DETECTED   Cocaine POSITIVE (*) NONE DETECTED   Benzodiazepines NONE DETECTED  NONE DETECTED   Amphetamines NONE DETECTED  NONE DETECTED   Tetrahydrocannabinol POSITIVE (*) NONE DETECTED   Barbiturates NONE DETECTED  NONE DETECTED  URINE MICROSCOPIC-ADD ON      Result Value Range   Squamous Epithelial / LPF FEW (*) RARE   WBC, UA 0-2  <3 WBC/hpf   RBC / HPF 0-2  <3 RBC/hpf   Bacteria, UA RARE  RARE   Ct Abdomen Pelvis W Contrast  03/09/2012  *RADIOLOGY REPORT*  Clinical Data: To  our history of nausea and vomiting with diffuse abdominal pain.  Chills and body aches.  CT ABDOMEN AND PELVIS WITH CONTRAST  Technique:  Multidetector CT imaging of the abdomen and pelvis was performed following the standard protocol during bolus administration of intravenous contrast.  Contrast: OMNIPAQUE IOHEXOL 300 MG/ML  SOLN  Comparison: 12/09/2011  Findings: The lung bases are clear.  The liver, spleen, gallbladder, pancreas, adrenal glands, kidneys, abdominal aorta, and retroperitoneal lymph nodes are unremarkable. The stomach is decompressed.  There appears to be diffuse duodenal wall thickening which may be due to under distension but duodenitis may be present.  Jejunum and ileum are decompressed.  Stool filled colon without abnormal distension.  Scattered diverticula.  No free air or free fluid in the abdomen.  Pelvis:  The bladder is decompressed.  Uterus and ovaries are not enlarged.  No free or loculated pelvic fluid collections.  The appendix is normal.  No evidence of diverticulitis.  No significant pelvic lymphadenopathy.  Normal alignment of the lumbar vertebrae.  IMPRESSION: Suggestion of diffuse duodenal wall thickening which may represent duodenitis.  No other acute changes are identified.   Original Report Authenticated By: Burman Nieves, M.D.      Patient agrees to go home if she can find a ride, and then asks for a note for work.    Roxy Horseman, PA-C 04/04/12 [phone]

## 2012-04-04 NOTE — ED Notes (Signed)
Pt was told not to continue drinking gingerale d/t nausea.  Returned to room d/t pt vomiting.  Pt drank 8 oz can of gingerale even with vomiting.  Pt now emptying stomach of gingerale and requesting pain meds.  NP notified.

## 2012-04-04 NOTE — ED Notes (Signed)
Pt vomiting post a few sips of gingerale.

## 2012-04-04 NOTE — ED Notes (Signed)
Pt states "I have kept my water down, can I have some more?"   Gave pt pain meds and some more water.  Informed pt since she has tolerated PO fluids she will be going home soon.  Pt states "But I still don't feel good."  Will inform Rob PA.

## 2012-04-05 ENCOUNTER — Emergency Department (HOSPITAL_COMMUNITY)
Admission: EM | Admit: 2012-04-05 | Discharge: 2012-04-05 | Disposition: A | Discharge status: Home / Self Care | Payer: Medicaid Other | Attending: Emergency Medicine | Admitting: Emergency Medicine

## 2012-04-05 ENCOUNTER — Encounter (HOSPITAL_COMMUNITY): Payer: Self-pay | Admitting: Emergency Medicine

## 2012-04-05 DIAGNOSIS — Z8719 Personal history of other diseases of the digestive system: Secondary | ICD-10-CM | POA: Insufficient documentation

## 2012-04-05 DIAGNOSIS — Z87440 Personal history of urinary (tract) infections: Secondary | ICD-10-CM | POA: Insufficient documentation

## 2012-04-05 DIAGNOSIS — R109 Unspecified abdominal pain: Secondary | ICD-10-CM

## 2012-04-05 DIAGNOSIS — F1020 Alcohol dependence, uncomplicated: Secondary | ICD-10-CM

## 2012-04-05 DIAGNOSIS — F1010 Alcohol abuse, uncomplicated: Secondary | ICD-10-CM | POA: Insufficient documentation

## 2012-04-05 DIAGNOSIS — R1084 Generalized abdominal pain: Secondary | ICD-10-CM | POA: Insufficient documentation

## 2012-04-05 DIAGNOSIS — F17200 Nicotine dependence, unspecified, uncomplicated: Secondary | ICD-10-CM | POA: Insufficient documentation

## 2012-04-05 DIAGNOSIS — Z658 Other specified problems related to psychosocial circumstances: Secondary | ICD-10-CM

## 2012-04-05 DIAGNOSIS — Z733 Stress, not elsewhere classified: Secondary | ICD-10-CM | POA: Insufficient documentation

## 2012-04-05 DIAGNOSIS — Z3202 Encounter for pregnancy test, result negative: Secondary | ICD-10-CM | POA: Insufficient documentation

## 2012-04-05 LAB — CBC WITH DIFFERENTIAL/PLATELET
Basophils Absolute: 0 10*3/uL (ref 0.0–0.1)
Basophils Absolute: 0 10*3/uL (ref 0.0–0.1)
Basophils Relative: 0 % (ref 0–1)
Basophils Relative: 0 % (ref 0–1)
Eosinophils Absolute: 0 10*3/uL (ref 0.0–0.7)
Eosinophils Absolute: 0 10*3/uL (ref 0.0–0.7)
Eosinophils Relative: 0 % (ref 0–5)
HCT: 41.8 % (ref 36.0–46.0)
Hemoglobin: 15.3 g/dL — ABNORMAL HIGH (ref 12.0–15.0)
Hemoglobin: 13.8 g/dL (ref 12.0–15.0)
Lymphocytes Relative: 29 % (ref 12–46)
Lymphs Abs: 2.1 10*3/uL (ref 0.7–4.0)
MCH: 32.3 pg (ref 26.0–34.0)
MCH: 31.2 pg (ref 26.0–34.0)
MCHC: 34.3 g/dL (ref 30.0–36.0)
MCHC: 33 g/dL (ref 30.0–36.0)
MCV: 94.4 fL (ref 78.0–100.0)
Monocytes Absolute: 0.5 10*3/uL (ref 0.1–1.0)
Monocytes Relative: 8 % (ref 3–12)
Neutro Abs: 5.3 10*3/uL (ref 1.7–7.7)
Neutro Abs: 4.5 10*3/uL (ref 1.7–7.7)
Neutrophils Relative %: 54 % (ref 43–77)
Neutrophils Relative %: 63 % (ref 43–77)
Platelets: 267 10*3/uL (ref 150–400)
Platelets: 254 10*3/uL (ref 150–400)
RBC: 4.43 MIL/uL (ref 3.87–5.11)
RDW: 12.3 % (ref 11.5–15.5)
RDW: 12.4 % (ref 11.5–15.5)
WBC: 7.1 10*3/uL (ref 4.0–10.5)

## 2012-04-05 LAB — COMPREHENSIVE METABOLIC PANEL
ALT: 8 U/L (ref 0–35)
AST: 19 U/L (ref 0–37)
Albumin: 4.2 g/dL (ref 3.5–5.2)
Alkaline Phosphatase: 79 U/L (ref 39–117)
BUN: 11 mg/dL (ref 6–23)
CO2: 20 mEq/L (ref 19–32)
Calcium: 9.4 mg/dL (ref 8.4–10.5)
Chloride: 101 mEq/L (ref 96–112)
Creatinine, Ser: 0.63 mg/dL (ref 0.50–1.10)
GFR calc Af Amer: >90 mL/min (ref >90)
GFR calc non Af Amer: >90 mL/min (ref >90)
Glucose, Bld: 108 mg/dL — ABNORMAL HIGH (ref 70–99)
Potassium: 2.9 mEq/L — ABNORMAL LOW (ref 3.5–5.1)
Sodium: 136 mEq/L (ref 135–145)
Total Bilirubin: 0.9 mg/dL (ref 0.3–1.2)
Total Protein: 7.8 g/dL (ref 6.0–8.3)

## 2012-04-05 LAB — URINALYSIS, MICROSCOPIC ONLY
Nitrite: NEGATIVE
Protein, ur: 30 mg/dL — AB
Specific Gravity, Urine: 1.044 — ABNORMAL HIGH (ref 1.005–1.030)
Urobilinogen, UA: 1 mg/dL (ref 0.0–1.0)

## 2012-04-05 LAB — RAPID URINE DRUG SCREEN, HOSP PERFORMED
Amphetamines: NOT DETECTED
Barbiturates: NOT DETECTED
Benzodiazepines: NOT DETECTED
Cocaine: POSITIVE — AB
Opiates: POSITIVE — AB
Tetrahydrocannabinol: POSITIVE — AB

## 2012-04-05 LAB — LIPASE, BLOOD: Lipase: 28 U/L (ref 11–59)

## 2012-04-05 LAB — POCT PREGNANCY, URINE: Preg Test, Ur: NEGATIVE

## 2012-04-05 LAB — ETHANOL: Alcohol, Ethyl (B): <11 mg/dL (ref 0–11)

## 2012-04-05 MED ORDER — ONDANSETRON HCL 4 MG/2ML IJ SOLN
4.0000 mg | Freq: Once | INTRAMUSCULAR | Status: AC
  Administered 2012-04-05: 4 mg via INTRAVENOUS
  Filled 2012-04-05: qty 2

## 2012-04-05 MED ORDER — IBUPROFEN 200 MG PO TABS: 600.0000 mg | ORAL_TABLET | Freq: Three times a day (TID) | ORAL | Status: DC | PRN

## 2012-04-05 MED ORDER — LORAZEPAM 1 MG PO TABS: 1.0000 mg | ORAL_TABLET | Freq: Three times a day (TID) | ORAL | Status: DC | PRN

## 2012-04-05 MED ORDER — POTASSIUM CHLORIDE CRYS ER 20 MEQ PO TBCR
60.0000 meq | EXTENDED_RELEASE_TABLET | Freq: Once | ORAL | Status: AC
  Administered 2012-04-05: 60 meq via ORAL
  Filled 2012-04-05: qty 3

## 2012-04-05 MED ORDER — HYDROMORPHONE HCL PF 1 MG/ML IJ SOLN
1.0000 mg | Freq: Once | INTRAMUSCULAR | Status: AC
  Administered 2012-04-05: 1 mg via INTRAVENOUS
  Filled 2012-04-05: qty 1

## 2012-04-05 MED ORDER — SODIUM CHLORIDE 0.9 % IV SOLN
Freq: Once | INTRAVENOUS | Status: AC
  Administered 2012-04-05: 09:00:00 via INTRAVENOUS

## 2012-04-05 MED ORDER — ALUM & MAG HYDROXIDE-SIMETH 200-200-20 MG/5ML PO SUSP: 30.0000 mL | ORAL | Status: DC | PRN

## 2012-04-05 MED ORDER — LORAZEPAM 2 MG/ML IJ SOLN: 1.5000 mg | Freq: Once | INTRAMUSCULAR | Status: DC

## 2012-04-05 MED ORDER — ZOLPIDEM TARTRATE 5 MG PO TABS: 5.0000 mg | ORAL_TABLET | Freq: Every evening | ORAL | Status: DC | PRN

## 2012-04-05 MED ORDER — SODIUM CHLORIDE 0.9 % IV SOLN
80.0000 mg | Freq: Once | INTRAVENOUS | Status: AC
  Administered 2012-04-05: 80 mg via INTRAVENOUS
  Filled 2012-04-05: qty 80

## 2012-04-05 MED ORDER — ONDANSETRON HCL 4 MG PO TABS: 4.0000 mg | ORAL_TABLET | Freq: Three times a day (TID) | ORAL | Status: DC | PRN

## 2012-04-05 MED ORDER — POTASSIUM CHLORIDE 10 MEQ/100ML IV SOLN: 10.0000 meq | INTRAVENOUS | Status: DC

## 2012-04-05 MED ORDER — ONDANSETRON HCL 4 MG/2ML IJ SOLN: 4.0000 mg | Freq: Once | INTRAMUSCULAR | Status: DC

## 2012-04-05 MED ORDER — GI COCKTAIL ~~LOC~~
30.0000 mL | Freq: Once | ORAL | Status: AC
  Administered 2012-04-05: 30 mL via ORAL
  Filled 2012-04-05: qty 30

## 2012-04-05 NOTE — ED Notes (Signed)
Per EMS. Pt was seen at [facility] ED last pm was told to quick drinking alcohol and using drugs. Pt continues to vomit. EMS states that she has voice SI.

## 2012-04-05 NOTE — ED Notes (Signed)
Telepsych in progress. 

## 2012-04-05 NOTE — ED Provider Notes (Signed)
Medical screening examination/treatment/procedure(s) were performed by non-physician practitioner and as supervising physician I was immediately available for consultation/collaboration.  Zarie Kosiba Smitty Cords, MD 04/05/12 0110

## 2012-04-05 NOTE — ED Notes (Signed)
[id number]<GN> Expected date:<BR> Expected time:<BR> Means of arrival:<BR> Comments:<BR> 27 y/o F abd pain, seen at [facility] yesterday

## 2012-04-05 NOTE — ED Notes (Signed)
[employer] at door stating that pt had a knife in her hand on their arrival stating that she was going to kill herself. Dr. Juleen China informed. Pt states that she wants the pain to stop. States that she has used cocaine and marijuana on Sunday night and that is when her vomiting began.

## 2012-04-05 NOTE — ED Notes (Signed)
Tele psych request faxed 

## 2012-04-05 NOTE — ED Notes (Signed)
CN states AC has no sitters.

## 2012-04-05 NOTE — ED Provider Notes (Signed)
Medical screening examination/treatment/procedure(s) were performed by non-physician practitioner and as supervising physician I was immediately available for consultation/collaboration.  Azar South K Savien Mamula-Rasch, MD 04/05/12 0110 

## 2012-04-05 NOTE — ED Notes (Signed)
Lab states blood collect was hemolyzed.

## 2012-04-05 NOTE — ED Notes (Signed)
Sitter at bedside.

## 2012-04-05 NOTE — ED Notes (Signed)
Voiced understanding of instructions given 

## 2012-04-05 NOTE — ED Notes (Signed)
Has been vomiting for the past two days. Actively vomiting at present. States that she just doesn't want to be here anymore because the pain will not go away.

## 2012-04-05 NOTE — ED Provider Notes (Signed)
History    26yf with abdominal pain and n/v. Seen last night in ED for same. Says symptoms persisting. Pt requesting to "speak with someone." When questioned further she states feels overwhelmed. Abuses etoh/drugs and financial stress. Didn't know what to do so told police wanted to kill herself. Doesn't think she could but didn't know what else to say. No fever or chills. No urinary complaints.  CSN: [id number]  Arrival date & time 04/05/12  0803   First MD Initiated Contact with Patient 04/05/12 [phone]      Chief Complaint  Patient presents with  . Emesis    (Consider location/radiation/quality/duration/timing/severity/associated sxs/prior treatment) HPI  Past Medical History  Diagnosis Date  . Abnormal Pap smear   . Urinary tract infection   . Gastritis     Past Surgical History  Procedure Laterality Date  . Leep    . Leep    . Hernia repair      As an infant    Family History  Problem Relation Age of Onset  . Hypertension Mother   . Cirrhosis Father     History  Substance Use Topics  . Smoking status: Current Every Day Smoker -- 1.00 packs/day for 14 years    Types: Cigarettes  . Smokeless tobacco: Never Used  . Alcohol Use: 7.2 oz/week    12 Glasses of wine per week     Comment: Weekends    OB History   Grav Para Term Preterm Abortions TAB SAB Ect Mult Living   2 2 0 0 0 0 0 0 0 2       Review of Systems  All systems reviewed and negative, other than as noted in HPI.   Allergies  Review of patient's allergies indicates no known allergies.  Home Medications   Current Outpatient Rx  Name  Route  Sig  Dispense  Refill  . ondansetron (ZOFRAN) 4 MG tablet   Oral   Take 1 tablet (4 mg total) by mouth every 8 (eight) hours as needed for nausea.   10 tablet   0     BP 116/63  Pulse 64  Temp(Src) 98 F (36.7 C) (Oral)  Resp 19  SpO2 100%  Physical Exam  Nursing note and vitals reviewed. Constitutional: She appears well-developed and  well-nourished. No distress.  HENT:  Head: Normocephalic and atraumatic.  Eyes: Conjunctivae are normal. Right eye exhibits no discharge. Left eye exhibits no discharge.  Neck: Neck supple.  Cardiovascular: Normal rate, regular rhythm and normal heart sounds.  Exam reveals no gallop and no friction rub.   No murmur heard. Pulmonary/Chest: Effort normal and breath sounds normal. No respiratory distress.  Abdominal: Soft. She exhibits no distension. There is tenderness.  Diffuse abdominal tenderness. Pt screaming hysterically with barely touching skin. Tolerated exam when distracted with seemingly minimal/no tenderness. NO distension.   Musculoskeletal: She exhibits no edema and no tenderness.  Neurological: She is alert.  Skin: Skin is warm and dry.  Psychiatric:  anxious    All systems reviewed and negative, other than as noted in HPI.\  ED Course  Procedures (including critical care time)  Labs Reviewed  CBC WITH DIFFERENTIAL - Abnormal; Notable for the following:    Hemoglobin 15.3 (*)    All other components within normal limits  URINALYSIS, MICROSCOPIC ONLY - Abnormal; Notable for the following:    Color, Urine AMBER (*)    APPearance CLOUDY (*)    Specific Gravity, Urine 1.044 (*)    Bilirubin Urine SMALL (*)  Ketones, ur >80 (*)    Protein, ur 30 (*)    Leukocytes, UA SMALL (*)    Bacteria, UA FEW (*)    All other components within normal limits  COMPREHENSIVE METABOLIC PANEL - Abnormal; Notable for the following:    Potassium 2.9 (*)    Glucose, Bld 108 (*)    All other components within normal limits  URINE RAPID DRUG SCREEN (HOSP PERFORMED) - Abnormal; Notable for the following:    Opiates POSITIVE (*)    Cocaine POSITIVE (*)    Tetrahydrocannabinol POSITIVE (*)    All other components within normal limits  URINE CULTURE  CBC WITH DIFFERENTIAL  LIPASE, BLOOD  ETHANOL  POCT PREGNANCY, URINE   No results found.   1. Abdominal pain   2. Alcohol  dependence   3. Psychosocial stressors       MDM  26yF with n/v and abdominal pain. W/u reassuring. Pt with much improved symptoms. Cleared by psychiatry. Return precautions discussed. Resources provided.        Raeford Razor, MD 04/08/12 1630

## 2012-04-06 LAB — URINE CULTURE: Colony Count: 8000

## 2012-07-02 ENCOUNTER — Encounter (HOSPITAL_COMMUNITY): Payer: Self-pay | Admitting: *Deleted

## 2012-07-02 ENCOUNTER — Inpatient Hospital Stay (HOSPITAL_COMMUNITY)
Admission: AD | Admit: 2012-07-02 | Discharge: 2012-07-02 | Disposition: A | Discharge status: Home / Self Care | Payer: Medicaid Other | Source: Ambulatory Visit | Attending: Obstetrics & Gynecology | Admitting: Obstetrics & Gynecology

## 2012-07-02 ENCOUNTER — Inpatient Hospital Stay (HOSPITAL_COMMUNITY): Payer: Medicaid Other

## 2012-07-02 DIAGNOSIS — O210 Mild hyperemesis gravidarum: Secondary | ICD-10-CM | POA: Insufficient documentation

## 2012-07-02 DIAGNOSIS — O219 Vomiting of pregnancy, unspecified: Secondary | ICD-10-CM

## 2012-07-02 DIAGNOSIS — O99891 Other specified diseases and conditions complicating pregnancy: Secondary | ICD-10-CM | POA: Insufficient documentation

## 2012-07-02 DIAGNOSIS — N949 Unspecified condition associated with female genital organs and menstrual cycle: Secondary | ICD-10-CM | POA: Insufficient documentation

## 2012-07-02 DIAGNOSIS — R109 Unspecified abdominal pain: Secondary | ICD-10-CM | POA: Insufficient documentation

## 2012-07-02 LAB — POCT PREGNANCY, URINE: Preg Test, Ur: POSITIVE — AB

## 2012-07-02 LAB — URINALYSIS, ROUTINE W REFLEX MICROSCOPIC
Hgb urine dipstick: NEGATIVE
Nitrite: NEGATIVE
Specific Gravity, Urine: 1.02 (ref 1.005–1.030)
Urobilinogen, UA: 1 mg/dL (ref 0.0–1.0)

## 2012-07-02 LAB — CBC
HCT: 38.3 % (ref 36.0–46.0)
Hemoglobin: 12.8 g/dL (ref 12.0–15.0)
MCH: 31.5 pg (ref 26.0–34.0)
MCV: 94.3 fL (ref 78.0–100.0)
RBC: 4.06 MIL/uL (ref 3.87–5.11)

## 2012-07-02 LAB — RAPID URINE DRUG SCREEN, HOSP PERFORMED
Amphetamines: NOT DETECTED
Opiates: NOT DETECTED

## 2012-07-02 MED ORDER — ONDANSETRON 8 MG PO TBDP
8.0000 mg | ORAL_TABLET | Freq: Once | ORAL | Status: AC
  Administered 2012-07-02: 8 mg via ORAL
  Filled 2012-07-02: qty 1

## 2012-07-02 NOTE — MAU Note (Signed)
I've been having reflux, vomiting, stomach pain, don't feel well. WEak and nauseated. Vomited couple times. Symptoms for last wk. Have not seen my cycle in awhile.

## 2012-07-02 NOTE — Discharge Instructions (Signed)
Pregnancy - First Trimester  During sexual intercourse, millions of sperm go into the vagina. Only 1 sperm will penetrate and fertilize the female egg while it is in the Fallopian tube. One week later, the fertilized egg implants into the wall of the uterus. An embryo begins to develop into a baby. At 6 to 8 weeks, the eyes and face are formed and the heartbeat can be seen on ultrasound. At the end of 12 weeks (first trimester), all the baby's organs are formed. Now that you are pregnant, you will want to do everything you can to have a healthy baby. Two of the most important things are to get good prenatal care and follow your caregiver's instructions. Prenatal care is all the medical care you receive before the baby's birth. It is given to prevent, find, and treat problems during the pregnancy and childbirth.  PRENATAL EXAMS  · During prenatal visits, your weight, blood pressure, and urine are checked. This is done to make sure you are healthy and progressing normally during the pregnancy.  · A pregnant woman should gain 25 to 35 pounds during the pregnancy. However, if you are overweight or underweight, your caregiver will advise you regarding your weight.  · Your caregiver will ask and answer questions for you.  · Blood work, cervical cultures, other necessary tests, and a Pap test are done during your prenatal exams. These tests are done to check on your health and the probable health of your baby. Tests are strongly recommended and done for HIV with your permission. This is the virus that causes AIDS. These tests are done because medicines can be given to help prevent your baby from being born with this infection should you have been infected without knowing it. Blood work is also used to find out your blood type, previous infections, and follow your blood levels (hemoglobin).  · Low hemoglobin (anemia) is common during pregnancy. Iron and vitamins are given to help prevent this. Later in the pregnancy, blood  tests for diabetes will be done along with any other tests if any problems develop.  · You may need other tests to make sure you and the baby are doing well.  CHANGES DURING THE FIRST TRIMESTER   Your body goes through many changes during pregnancy. They vary from person to person. Talk to your caregiver about changes you notice and are concerned about. Changes can include:  · Your menstrual period stops.  · The egg and sperm carry the genes that determine what you look like. Genes from you and your partner are forming a baby. The female genes determine whether the baby is a boy or a girl.  · Your body increases in girth and you may feel bloated.  · Feeling sick to your stomach (nauseous) and throwing up (vomiting). If the vomiting is uncontrollable, call your caregiver.  · Your breasts will begin to enlarge and become tender.  · Your nipples may stick out more and become darker.  · The need to urinate more. Painful urination may mean you have a bladder infection.  · Tiring easily.  · Loss of appetite.  · Cravings for certain kinds of food.  · At first, you may gain or lose a couple of pounds.  · You may have changes in your emotions from day to day (excited to be pregnant or concerned something may go wrong with the pregnancy and baby).  · You may have more vivid and strange dreams.  HOME CARE INSTRUCTIONS   ·   It is very important to avoid all smoking, alcohol and non-prescribed drugs during your pregnancy. These affect the formation and growth of the baby. Avoid chemicals while pregnant to ensure the delivery of a healthy infant.  · Start your prenatal visits by the 12th week of pregnancy. They are usually scheduled monthly at first, then more often in the last 2 months before delivery. Keep your caregiver's appointments. Follow your caregiver's instructions regarding medicine use, blood and lab tests, exercise, and diet.  · During pregnancy, you are providing food for you and your baby. Eat regular, well-balanced  meals. Choose foods such as meat, fish, milk and other low fat dairy products, vegetables, fruits, and whole-grain breads and cereals. Your caregiver will tell you of the ideal weight gain.  · You can help morning sickness by keeping soda crackers at the bedside. Eat a couple before arising in the morning. You may want to use the crackers without salt on them.  · Eating 4 to 5 small meals rather than 3 large meals a day also may help the nausea and vomiting.  · Drinking liquids between meals instead of during meals also seems to help nausea and vomiting.  · A physical sexual relationship may be continued throughout pregnancy if there are no other problems. Problems may be early (premature) leaking of amniotic fluid from the membranes, vaginal bleeding, or belly (abdominal) pain.  · Exercise regularly if there are no restrictions. Check with your caregiver or physical therapist if you are unsure of the safety of some of your exercises. Greater weight gain will occur in the last 2 trimesters of pregnancy. Exercising will help:  · Control your weight.  · Keep you in shape.  · Prepare you for labor and delivery.  · Help you lose your pregnancy weight after you deliver your baby.  · Wear a good support or jogging bra for breast tenderness during pregnancy. This may help if worn during sleep too.  · Ask when prenatal classes are available. Begin classes when they are offered.  · Do not use hot tubs, steam rooms, or saunas.  · Wear your seat belt when driving. This protects you and your baby if you are in an accident.  · Avoid raw meat, uncooked cheese, cat litter boxes, and soil used by cats throughout the pregnancy. These carry germs that can cause birth defects in the baby.  · The first trimester is a good time to visit your dentist for your dental health. Getting your teeth cleaned is okay. Use a softer toothbrush and brush gently during pregnancy.  · Ask for help if you have financial, counseling, or nutritional needs  during pregnancy. Your caregiver will be able to offer counseling for these needs as well as refer you for other special needs.  · Do not take any medicines or herbs unless told by your caregiver.  · Inform your caregiver if there is any mental or physical domestic violence.  · Make a list of emergency phone numbers of family, friends, hospital, and police and fire departments.  · Write down your questions. Take them to your prenatal visit.  · Do not douche.  · Do not cross your legs.  · If you have to stand for long periods of time, rotate you feet or take small steps in a circle.  · You may have more vaginal secretions that may require a sanitary pad. Do not use tampons or scented sanitary pads.  MEDICINES AND DRUG USE IN PREGNANCY  ·   Take prenatal vitamins as directed. The vitamin should contain 1 milligram of folic acid. Keep all vitamins out of reach of children. Only a couple vitamins or tablets containing iron may be fatal to a baby or young child when ingested.  · Avoid use of all medicines, including herbs, over-the-counter medicines, not prescribed or suggested by your caregiver. Only take over-the-counter or prescription medicines for pain, discomfort, or fever as directed by your caregiver. Do not use aspirin, ibuprofen, or naproxen unless directed by your caregiver.  · Let your caregiver also know about herbs you may be using.  · Alcohol is related to a number of birth defects. This includes fetal alcohol syndrome. All alcohol, in any form, should be avoided completely. Smoking will cause low birth rate and premature babies.  · Street or illegal drugs are very harmful to the baby. They are absolutely forbidden. A baby born to an addicted mother will be addicted at birth. The baby will go through the same withdrawal an adult does.  · Let your caregiver know about any medicines that you have to take and for what reason you take them.  SEEK MEDICAL CARE IF:   You have any concerns or worries during your  pregnancy. It is better to call with your questions if you feel they cannot wait, rather than worry about them.  SEEK IMMEDIATE MEDICAL CARE IF:   · An unexplained oral temperature above 102° F (38.9° C) develops, or as your caregiver suggests.  · You have leaking of fluid from the vagina (birth canal). If leaking membranes are suspected, take your temperature and inform your caregiver of this when you call.  · There is vaginal spotting or bleeding. Notify your caregiver of the amount and how many pads are used.  · You develop a bad smelling vaginal discharge with a change in the color.  · You continue to feel sick to your stomach (nauseated) and have no relief from remedies suggested. You vomit blood or coffee ground-like materials.  · You lose more than 2 pounds of weight in 1 week.  · You gain more than 2 pounds of weight in 1 week and you notice swelling of your face, hands, feet, or legs.  · You gain 5 pounds or more in 1 week (even if you do not have swelling of your hands, face, legs, or feet).  · You get exposed to German measles and have never had them.  · You are exposed to fifth disease or chickenpox.  · You develop belly (abdominal) pain. Round ligament discomfort is a common non-cancerous (benign) cause of abdominal pain in pregnancy. Your caregiver still must evaluate this.  · You develop headache, fever, diarrhea, pain with urination, or shortness of breath.  · You fall or are in a car accident or have any kind of trauma.  · There is mental or physical violence in your home.  Document Released: 12/23/2000 Document Revised: 09/23/2011 Document Reviewed: 06/26/2008  [employer]® Patient Information ©2014 [employer].

## 2012-07-02 NOTE — MAU Provider Note (Signed)
History     CSN: [id number]  Arrival date and time: 07/02/12 4098   First Provider Initiated Contact with Patient 07/02/12 2050      Chief Complaint  Patient presents with  . Emesis  . Abdominal Pain   Emesis  Associated symptoms include abdominal pain.  Abdominal Pain Associated symptoms include vomiting.    Dawn Brock is a 27 y.o. G3P2002 at [redacted]w[redacted]d who presents today with nausea, abdominal pain, fatigue. She is very tearful about finding out she is pregnant. She feels as though she may want to terminate the pregnancy. She denies any bleeding. She is feeling sharp pains on her lower abdomen more on the left than the right. She states that this was an unplanned pregnancy, and she is shocked about the news today.   Past Medical History  Diagnosis Date  . Abnormal Pap smear   . Urinary tract infection   . Gastritis     Past Surgical History  Procedure Laterality Date  . Leep    . Leep    . Hernia repair      As an infant    Family History  Problem Relation Age of Onset  . Hypertension Mother   . Cirrhosis Father     History  Substance Use Topics  . Smoking status: Current Every Day Smoker -- 1.00 packs/day for 14 years    Types: Cigarettes  . Smokeless tobacco: Never Used  . Alcohol Use: 7.2 oz/week    12 Glasses of wine per week     Comment: Weekends    Allergies: No Known Allergies  Prescriptions prior to admission  Medication Sig Dispense Refill  . ondansetron (ZOFRAN) 4 MG tablet Take 1 tablet (4 mg total) by mouth every 8 (eight) hours as needed for nausea.  10 tablet  0    Review of Systems  Gastrointestinal: Positive for vomiting and abdominal pain.   Physical Exam   Blood pressure 118/70, pulse 83, temperature 99.1 F (37.3 C), resp. rate 18, height 5\' 7"  (1.702 m), weight 79.833 kg (176 lb), last menstrual period 05/03/2012, SpO2 100.00%.  Physical Exam  Nursing note and vitals reviewed. Constitutional: She is oriented to person,  place, and time. She appears well-developed and well-nourished. No distress.  Cardiovascular: Normal rate.   Respiratory: Effort normal.  GI: Soft. There is no tenderness.  Neurological: She is oriented to person, place, and time.  Skin: Skin is warm and dry.  Psychiatric: She has a normal mood and affect.  Tearful, and states she is feeling "very overwhelmed" about everything right now.     MAU Course  Procedures  Results for orders placed during the hospital encounter of 07/02/12 (from the past 24 hour(s))  URINALYSIS, ROUTINE W REFLEX MICROSCOPIC     Status: Abnormal   Collection Time    07/02/12  7:48 PM      Result Value Range   Color, Urine YELLOW  YELLOW   APPearance CLEAR  CLEAR   Specific Gravity, Urine 1.020  1.005 - 1.030   pH 7.5  5.0 - 8.0   Glucose, UA NEGATIVE  NEGATIVE mg/dL   Hgb urine dipstick NEGATIVE  NEGATIVE   Bilirubin Urine NEGATIVE  NEGATIVE   Ketones, ur 15 (*) NEGATIVE mg/dL   Protein, ur NEGATIVE  NEGATIVE mg/dL   Urobilinogen, UA 1.0  0.0 - 1.0 mg/dL   Nitrite NEGATIVE  NEGATIVE   Leukocytes, UA NEGATIVE  NEGATIVE  URINE RAPID DRUG SCREEN (HOSP PERFORMED)  Status: Abnormal   Collection Time    07/02/12  7:48 PM      Result Value Range   Opiates NONE DETECTED  NONE DETECTED   Cocaine POSITIVE (*) NONE DETECTED   Benzodiazepines NONE DETECTED  NONE DETECTED   Amphetamines NONE DETECTED  NONE DETECTED   Tetrahydrocannabinol POSITIVE (*) NONE DETECTED   Barbiturates NONE DETECTED  NONE DETECTED  POCT PREGNANCY, URINE     Status: Abnormal   Collection Time    07/02/12  8:02 PM      Result Value Range   Preg Test, Ur POSITIVE (*) NEGATIVE  CBC     Status: None   Collection Time    07/02/12  8:11 PM      Result Value Range   WBC 8.0  4.0 - 10.5 K/uL   RBC 4.06  3.87 - 5.11 MIL/uL   Hemoglobin 12.8  12.0 - 15.0 g/dL   HCT 11.9  14.7 - 82.9 %   MCV 94.3  78.0 - 100.0 fL   MCH 31.5  26.0 - 34.0 pg   MCHC 33.4  30.0 - 36.0 g/dL   RDW 56.2   13.0 - 86.5 %   Platelets 223  150 - 400 K/uL  ABO/RH     Status: None   Collection Time    07/02/12  8:11 PM      Result Value Range   ABO/RH(D) O POS    HCG, QUANTITATIVE, PREGNANCY     Status: Abnormal   Collection Time    07/02/12  8:11 PM      Result Value Range   hCG, Beta Chain, Quant, S 78469 (*) <5 mIU/mL   *RADIOLOGY REPORT*  Clinical Data: Pelvic pain.  OBSTETRIC <14 WK ULTRASOUND  Technique: Transabdominal ultrasound was performed for evaluation  of the gestation as well as the maternal uterus and adnexal  regions.  Comparison: None  Intrauterine gestational sac: Visualized/normal in shape.  Yolk sac: Present  Embryo: Present  Cardiac Activity: Present  Heart Rate: 165 bpm  MSD: mm w d  CRL: 14 mm seven w five d [place] EDC: 02/13/2013  Maternal uterus/Adnexae:  No subchorionic hemorrhage.  Normal right ovary.  Normal left ovary.  No free pelvic fluid.  IMPRESSION:  1. Single living intrauterine embryo estimated at 7 weeks and 5  days gestation.  2. Small yolk sac.  3. No subchorionic hemorrhage.  4. Normal ovaries.  Original Report Authenticated By: Rudie Meyer, M.D.   Assessment and Plan   1. Nausea and vomiting in pregnancy prior to [redacted] weeks gestation   2. Pelvic pain complicating pregnancy, antepartum, first trimester    1st trimester danger signs reviewed Nausea and vomiting comfort measures reviewed  Tawnya Crook 07/02/2012, 8:51 PM

## 2012-07-25 ENCOUNTER — Encounter (HOSPITAL_COMMUNITY): Payer: Self-pay

## 2012-07-25 ENCOUNTER — Inpatient Hospital Stay (HOSPITAL_COMMUNITY)
Admission: AD | Admit: 2012-07-25 | Discharge: 2012-07-25 | Discharge status: Against Medical Advice | Payer: Medicaid Other | Source: Ambulatory Visit | Attending: Obstetrics & Gynecology | Admitting: Obstetrics & Gynecology

## 2012-07-25 DIAGNOSIS — Z640 Problems related to unwanted pregnancy: Secondary | ICD-10-CM | POA: Insufficient documentation

## 2012-07-25 DIAGNOSIS — R109 Unspecified abdominal pain: Secondary | ICD-10-CM | POA: Insufficient documentation

## 2012-07-25 DIAGNOSIS — O99340 Other mental disorders complicating pregnancy, unspecified trimester: Secondary | ICD-10-CM | POA: Insufficient documentation

## 2012-07-25 DIAGNOSIS — F430 Acute stress reaction: Secondary | ICD-10-CM | POA: Insufficient documentation

## 2012-07-25 LAB — URINALYSIS, ROUTINE W REFLEX MICROSCOPIC
Bilirubin Urine: NEGATIVE
Glucose, UA: NEGATIVE mg/dL
Hgb urine dipstick: NEGATIVE
Ketones, ur: 15 mg/dL — AB
Nitrite: NEGATIVE
Protein, ur: NEGATIVE mg/dL
Specific Gravity, Urine: 1.015 (ref 1.005–1.030)
Urobilinogen, UA: 1 mg/dL (ref 0.0–1.0)
pH: 7.5 (ref 5.0–8.0)

## 2012-07-25 LAB — URINE MICROSCOPIC-ADD ON

## 2012-07-25 NOTE — MAU Provider Note (Signed)
  History     CSN: [id number]  Arrival date and time: 07/25/12 1345   First Provider Initiated Contact with Patient 07/25/12 1618      Chief Complaint  Patient presents with  . Abdominal Pain  . Emesis During Pregnancy  . Vaginal Discharge   HPI Dawn Brock is 27 y.o. G3P2002 [redacted]w[redacted]d weeks presenting with report of being stressed out, not wanting this pregnancy, boyfriend not willing to talk with her long enough to ask him for half the money for termination, she has been smoking cigarettes, no prenatal care, stressful with 2 other children and now cramping.  She contacted an abortion clinic in [facility] and thinks they are expensive.  States this stress is interfering in her ability to work.  States the some of the girls at work are having same symptoms and are having miscarriages.  She is here for the cramping because she does not know what medication she can take.  Rates her pain as an 8/10 but appears comfortable and is on the phone. She denies vaginal bleeding.      Past Medical History  Diagnosis Date  . Abnormal Pap smear   . Urinary tract infection   . Gastritis     Past Surgical History  Procedure Laterality Date  . Leep    . Leep    . Hernia repair      As an infant    Family History  Problem Relation Age of Onset  . Hypertension Mother   . Cirrhosis Father     History  Substance Use Topics  . Smoking status: Current Every Day Smoker -- 1.00 packs/day for 14 years    Types: Cigarettes  . Smokeless tobacco: Never Used  . Alcohol Use: 7.2 oz/week    12 Glasses of wine per week     Comment: Weekends    Allergies: No Known Allergies  Prescriptions prior to admission  Medication Sig Dispense Refill  . [DISCONTINUED] ondansetron (ZOFRAN) 4 MG tablet Take 1 tablet (4 mg total) by mouth every 8 (eight) hours as needed for nausea.  10 tablet  0    ROS  anxious + for cramping Neg for vaginal bleeding or discharg Physical Exam   Blood pressure  118/68, pulse 69, temperature 98.1 F (36.7 C), temperature source Oral, resp. rate 16, height 5' 6.5" (1.689 m), weight 172 lb (78.019 kg), last menstrual period 05/03/2012, SpO2 100.00%.  Physical Exam Patient is alert and oriented.  Fast paced speech but certainly co-herent and logical in thought.  Patient left before exam could be performed. MAU Course  Procedures  MDM I explained to the patient that I would need to perform a physical exam to evaluate her cramping.  She agreed.  I left the room to put in orders.  Shortly after that, Early Osmond, RN called to tell me the patient's ride was here and that she cannot wait for evaluation.  I went back into the room, offered to exam her and she declined.  Note for work was provided for today.   Assessment and Plan  A:  Stress related to unwanted pregnancy      Cramping  P:  Patient left AMA before exam/labs could be done.  Tinya Cadogan,EVE M 07/25/2012, 4:49 PM

## 2012-07-25 NOTE — MAU Note (Signed)
Patient is tearful, she states that she wants to have an abortion but can't come up with the money yet. fht obtained 165-167. Patient denies vaginal bleeding. She also c/o abdominal cramping.

## 2012-07-25 NOTE — MAU Note (Signed)
Patient states she had been having abdominal pain since she was seen in MAU in June. States she is very stressed and having a hard time coping. Denies bleeding and has a slight vaginal discharge. Some nausea and vomiting.

## 2012-09-29 ENCOUNTER — Other Ambulatory Visit (HOSPITAL_COMMUNITY): Payer: Self-pay | Admitting: Physician Assistant

## 2012-09-29 DIAGNOSIS — Z3482 Encounter for supervision of other normal pregnancy, second trimester: Secondary | ICD-10-CM

## 2012-09-29 LAB — OB RESULTS CONSOLE GC/CHLAMYDIA
Chlamydia: NEGATIVE
Gonorrhea: NEGATIVE

## 2012-09-29 LAB — OB RESULTS CONSOLE HIV ANTIBODY (ROUTINE TESTING): HIV: NONREACTIVE

## 2012-09-29 LAB — OB RESULTS CONSOLE ANTIBODY SCREEN: ANTIBODY SCREEN: NEGATIVE

## 2012-09-29 LAB — OB RESULTS CONSOLE ABO/RH: RH TYPE: POSITIVE

## 2012-09-29 LAB — OB RESULTS CONSOLE RUBELLA ANTIBODY, IGM: RUBELLA: IMMUNE

## 2012-09-29 LAB — OB RESULTS CONSOLE RPR: RPR: NONREACTIVE

## 2012-09-29 LAB — OB RESULTS CONSOLE HEPATITIS B SURFACE ANTIGEN: HEP B S AG: NEGATIVE

## 2012-09-30 ENCOUNTER — Encounter (HOSPITAL_COMMUNITY): Payer: Self-pay | Admitting: Emergency Medicine

## 2012-09-30 ENCOUNTER — Inpatient Hospital Stay (HOSPITAL_COMMUNITY)
Admission: EM | Admit: 2012-09-30 | Discharge: 2012-10-04 | DRG: 781 | Disposition: A | Discharge status: Home / Self Care | Payer: Medicaid Other | Attending: Obstetrics and Gynecology | Admitting: Obstetrics and Gynecology

## 2012-09-30 DIAGNOSIS — F1920 Other psychoactive substance dependence, uncomplicated: Secondary | ICD-10-CM

## 2012-09-30 DIAGNOSIS — Z3490 Encounter for supervision of normal pregnancy, unspecified, unspecified trimester: Secondary | ICD-10-CM

## 2012-09-30 DIAGNOSIS — O99340 Other mental disorders complicating pregnancy, unspecified trimester: Secondary | ICD-10-CM

## 2012-09-30 DIAGNOSIS — F1020 Alcohol dependence, uncomplicated: Secondary | ICD-10-CM | POA: Diagnosis present

## 2012-09-30 DIAGNOSIS — E860 Dehydration: Secondary | ICD-10-CM | POA: Diagnosis present

## 2012-09-30 DIAGNOSIS — O99320 Drug use complicating pregnancy, unspecified trimester: Secondary | ICD-10-CM

## 2012-09-30 DIAGNOSIS — O211 Hyperemesis gravidarum with metabolic disturbance: Secondary | ICD-10-CM

## 2012-09-30 DIAGNOSIS — F1220 Cannabis dependence, uncomplicated: Secondary | ICD-10-CM | POA: Diagnosis present

## 2012-09-30 LAB — COMPREHENSIVE METABOLIC PANEL
ALT: 8 U/L (ref 0–35)
AST: 15 U/L (ref 0–37)
Albumin: 3.6 g/dL (ref 3.5–5.2)
CO2: 23 mEq/L (ref 19–32)
Calcium: 9.3 mg/dL (ref 8.4–10.5)
Chloride: 99 mEq/L (ref 96–112)
GFR calc non Af Amer: >90 mL/min (ref >90)
Sodium: 134 mEq/L — ABNORMAL LOW (ref 135–145)

## 2012-09-30 LAB — URINALYSIS, ROUTINE W REFLEX MICROSCOPIC
Bilirubin Urine: NEGATIVE
Glucose, UA: NEGATIVE mg/dL
Hgb urine dipstick: NEGATIVE
Specific Gravity, Urine: 1.01 (ref 1.005–1.030)
Urobilinogen, UA: 1 mg/dL (ref 0.0–1.0)

## 2012-09-30 LAB — CBC WITH DIFFERENTIAL/PLATELET
Basophils Absolute: 0 10*3/uL (ref 0.0–0.1)
Basophils Relative: 0 % (ref 0–1)
Eosinophils Relative: 1 % (ref 0–5)
Lymphocytes Relative: 36 % (ref 12–46)
MCHC: 34.7 g/dL (ref 30.0–36.0)
Neutro Abs: 4 10*3/uL (ref 1.7–7.7)
Platelets: 237 10*3/uL (ref 150–400)
RDW: 13.1 % (ref 11.5–15.5)
WBC: 7.4 10*3/uL (ref 4.0–10.5)

## 2012-09-30 LAB — ETHANOL: Alcohol, Ethyl (B): <11 mg/dL (ref 0–11)

## 2012-09-30 LAB — RAPID URINE DRUG SCREEN, HOSP PERFORMED
Barbiturates: NOT DETECTED
Benzodiazepines: NOT DETECTED

## 2012-09-30 LAB — URINE MICROSCOPIC-ADD ON

## 2012-09-30 LAB — PREGNANCY, URINE: Preg Test, Ur: POSITIVE — AB

## 2012-09-30 LAB — HCG, QUANTITATIVE, PREGNANCY: hCG, Beta Chain, Quant, S: 9593 m[IU]/mL — ABNORMAL HIGH (ref <5)

## 2012-09-30 MED ORDER — SODIUM CHLORIDE 0.9 % IV BOLUS (SEPSIS)
1000.0000 mL | Freq: Once | INTRAVENOUS | Status: AC
  Administered 2012-09-30: 1000 mL via INTRAVENOUS

## 2012-09-30 MED ORDER — KCL IN DEXTROSE-NACL 20-5-0.45 MEQ/L-%-% IV SOLN
INTRAVENOUS | Status: DC
  Administered 2012-10-01 (×2): via INTRAVENOUS
  Filled 2012-09-30 (×5): qty 1000

## 2012-09-30 MED ORDER — ZOLPIDEM TARTRATE 5 MG PO TABS
5.0000 mg | ORAL_TABLET | Freq: Every evening | ORAL | Status: DC | PRN
  Administered 2012-09-30 – 2012-10-03 (×2): 5 mg via ORAL
  Filled 2012-09-30 (×2): qty 1

## 2012-09-30 MED ORDER — CALCIUM CARBONATE ANTACID 500 MG PO CHEW
2.0000 | CHEWABLE_TABLET | ORAL | Status: DC | PRN
  Administered 2012-10-01: 400 mg via ORAL
  Filled 2012-09-30 (×2): qty 1

## 2012-09-30 MED ORDER — DOCUSATE SODIUM 100 MG PO CAPS
100.0000 mg | ORAL_CAPSULE | Freq: Every day | ORAL | Status: DC
  Administered 2012-10-03: 100 mg via ORAL
  Filled 2012-09-30: qty 1

## 2012-09-30 MED ORDER — METOCLOPRAMIDE HCL 5 MG/ML IJ SOLN
10.0000 mg | Freq: Once | INTRAMUSCULAR | Status: AC
  Administered 2012-09-30: 10 mg via INTRAVENOUS
  Filled 2012-09-30: qty 2

## 2012-09-30 MED ORDER — ONDANSETRON HCL 4 MG/2ML IJ SOLN
4.0000 mg | Freq: Once | INTRAMUSCULAR | Status: AC
  Administered 2012-09-30: 4 mg via INTRAVENOUS
  Filled 2012-09-30: qty 2

## 2012-09-30 MED ORDER — PROMETHAZINE HCL 25 MG/ML IJ SOLN
12.5000 mg | INTRAMUSCULAR | Status: DC | PRN
  Administered 2012-09-30 – 2012-10-02 (×7): 12.5 mg via INTRAVENOUS
  Filled 2012-09-30 (×7): qty 1

## 2012-09-30 MED ORDER — ACETAMINOPHEN 325 MG PO TABS: 650.0000 mg | ORAL_TABLET | ORAL | Status: DC | PRN

## 2012-09-30 MED ORDER — PRENATAL MULTIVITAMIN CH: 1.0000 | ORAL_TABLET | Freq: Every day | ORAL | Status: DC

## 2012-09-30 MED ORDER — PROMETHAZINE HCL 25 MG/ML IJ SOLN
25.0000 mg | Freq: Once | INTRAMUSCULAR | Status: AC
  Administered 2012-09-30: 25 mg via INTRAVENOUS
  Filled 2012-09-30: qty 1

## 2012-09-30 NOTE — ED Notes (Signed)
Pt reports that she is pregnant and has been vomiting since. Pt found out in June that she is pregnant.

## 2012-09-30 NOTE — ED Notes (Signed)
Pt vomiting, Marissa PA notified.

## 2012-09-30 NOTE — ED Notes (Signed)
Spoke with Denny Peon, [place] [facility] nurse and she advised that the fetus is not viable yet and therefore does not need to be on the monitor.  A FHT assessment would suffice.

## 2012-09-30 NOTE — ED Provider Notes (Signed)
CSN: [id number]     Arrival date & time 09/30/12  4696 History   First MD Initiated Contact with Patient 09/30/12 0840     Chief Complaint  Patient presents with  . Emesis   (Consider location/radiation/quality/duration/timing/severity/associated sxs/prior Treatment) The history is provided by the patient. No language interpreter was used.  Dawn Brock is a 27 year old female with past medical history of gastritis, currently [redacted] weeks pregnant presenting to emergency department with emesis that has been ongoing since patient was pronounced to be pregnant in Jun 28, 2012. Patient reported that her emesis and nausea has been ongoing since 06/17/2014reported that it occurs intermittently throughout the week. Patient reported that today the emesis and nausea is continuous and has been nonstop since last night. Patient reported that she has had episodes of emesis nonbloody, nonbilious-reported mainly of stomach contents. Patient reported that she has not been taking any prenatal vitamins due to not being sure if she wanted to keep the baby. Patient reported that she went to an appointment at [facility] hospital yesterday and decided that she's going to keep the baby, stated that it is too late for an abortion to occur, reported that she started taking prenatal vitamins yesterday. Patient reported that she's been having some mild abdominal pain secondary to the vomiting, reported that her stomach feels sore. Patient reported that this would be her third pregnancy - [id number] - patient reported that in her first pregnancy she had a similar issue with emesis and nausea. Patient denied using any medications. Denied vaginal bleeding, vaginal discharge, fever, neck pain, back pain, diarrhea. PCP none  Past Medical History  Diagnosis Date  . Abnormal Pap smear   . Urinary tract infection   . Gastritis    Past Surgical History  Procedure Laterality Date  . Leep    . Leep    . Hernia repair      As an  infant   Family History  Problem Relation Age of Onset  . Hypertension Mother   . Cirrhosis Father    History  Substance Use Topics  . Smoking status: Current Every Day Smoker -- 1.00 packs/day for 14 years    Types: Cigarettes  . Smokeless tobacco: Never Used  . Alcohol Use: 7.2 oz/week    12 Glasses of wine per week     Comment: Weekends Notsince 06/28/2012  OB History   Grav Para Term Preterm Abortions TAB SAB Ect Mult Living   3 2 2  0 0 0 0 0 0 2     Review of Systems  Constitutional: Negative for fever and chills.  HENT: Negative for trouble swallowing and neck pain.   Eyes: Negative for visual disturbance.  Respiratory: Negative for chest tightness and shortness of breath.   Cardiovascular: Negative for chest pain.  Gastrointestinal: Positive for nausea, vomiting and abdominal pain. Negative for diarrhea, constipation and blood in stool.  Genitourinary: Negative for dysuria, hematuria, decreased urine volume, vaginal bleeding, vaginal discharge, difficulty urinating and vaginal pain.  Neurological: Positive for weakness. Negative for dizziness.  All other systems reviewed and are negative.    Allergies  Review of patient's allergies indicates no known allergies.  Home Medications  No current outpatient prescriptions on file. BP 115/63  Pulse 70  Temp(Src) 98 F (36.7 C) (Oral)  Resp 20  Ht 5' 6.5" (1.689 m)  Wt 169 lb (76.658 kg)  BMI 26.87 kg/m2  SpO2 100%  LMP 05/03/2012 Physical Exam  Nursing note and vitals reviewed.  Constitutional: She is oriented to person, place, and time. She appears well-developed and well-nourished. No distress.  Patient appears lethargic  HENT:  Head: Normocephalic and atraumatic.  Dry mucus membranes  Eyes: Conjunctivae and EOM are normal. Pupils are equal, round, and reactive to light. Right eye exhibits no discharge. Left eye exhibits no discharge.  Neck: Normal range of motion. Neck supple.  Negative neck  stiffness Negative nuchal rigidity Negative cervical lymphadenopathy  Cardiovascular: Normal rate, regular rhythm and normal heart sounds.  Exam reveals no friction rub.   No murmur heard. Pulses:      Radial pulses are 2+ on the right side, and 2+ on the left side.       Dorsalis pedis pulses are 2+ on the right side, and 2+ on the left side.  Pulmonary/Chest: Effort normal and breath sounds normal. No respiratory distress. She has no wheezes. She has no rales. She exhibits no tenderness.  Abdominal: Soft. Bowel sounds are normal. She exhibits no distension. There is tenderness. There is no rebound and no guarding.  Generalized discomfort upon palpation to the abdomen, negative guarding or rebound noted  Musculoskeletal: Normal range of motion.  Lymphadenopathy:    She has no cervical adenopathy.  Neurological: She is alert and oriented to person, place, and time. No cranial nerve deficit. She exhibits normal muscle tone. Coordination normal.  Cranial nerves III-XII grossly intact Strength decreased secondary to being weak and dehydrated  Skin: Skin is warm and dry. No rash noted. She is not diaphoretic. No erythema.  Psychiatric: She has a normal mood and affect. Her behavior is normal. Thought content normal.    ED Course  Procedures (including critical care time)  This provider reviewed patient's chart. Patient was pronounced to be pregnant on 20-Jul-2012. Patient [facility]. Patient had an ultrasound performed on Jul 20, 2012 that identified a single intrauterine pregnancy.  9:43 Am Fetal heart rate monitored, FH rate 153 bpm.   11:20 AM RN reported that patient continues to have nausea and emesis. Patient reportedly dry heaving. IV fluids and anti-emetics ordered.  12:25 PM Patient has not vomited in the past 2 hours. Patient continues to retch with nothing coming up.  12:29 PM Went to check up on patient. Patient reported that she is not feeling  well, demanding pain medications. Reported that she came to the ED because it is closer to her house instead of going to the [facility].   12:37 PM Spoke with Dr. Debroah Loop, OBGYN, who recommended to use phenergan, reported that it is okay to use phenergan in pregnancy. Discussed that if patient continues to not tolerate well to give him a call back and will discuss.   12:54 PM Spoke with patient regarding plan.   3:01 PM Patient found sleeping when this provider entered room. Checked up on patient and she reported that she is feeling better. Discussed with patient to try and eat something to see if she is able to keep anything down. Patient agreed to try and eat something.    3:20 PM RN reported to this provider that when gingerale was given to patient she spit it out and said that she couldn't swallow it. This provider went into the room to discuss with patient. Patient reported that she cannot swallow. Patient attempted, reported that she is unable to drink, her body is too "weak."   4:18 PM Spoke with Dr. Marya Fossa, from [facility] regarding case. Patient to be admitted to third floor of  [facility] for monitoring regarding patient's hyperemesis gravidarum and dehydration.   Medications  ondansetron (ZOFRAN) injection 4 mg (4 mg Intravenous Given 09/30/12 1017)  sodium chloride 0.9 % bolus 1,000 mL (0 mLs Intravenous Stopped 09/30/12 1203)  sodium chloride 0.9 % bolus 1,000 mL (0 mLs Intravenous Stopped 09/30/12 1332)  ondansetron (ZOFRAN) injection 4 mg (4 mg Intravenous Given 09/30/12 1202)  metoCLOPramide (REGLAN) injection 10 mg (10 mg Intravenous Given 09/30/12 1203)  promethazine (PHENERGAN) injection 25 mg (25 mg Intravenous Given 09/30/12 1332)  sodium chloride 0.9 % bolus 1,000 mL (0 mLs Intravenous Stopped 09/30/12 1747)  sodium chloride 0.9 % bolus 1,000 mL (1,000 mLs Intravenous New Bag/Given 09/30/12 1751)    Labs Review Labs Reviewed  CBC WITH DIFFERENTIAL -  Abnormal; Notable for the following:    RBC 3.84 (*)    All other components within normal limits  COMPREHENSIVE METABOLIC PANEL - Abnormal; Notable for the following:    Sodium 134 (*)    Potassium 3.4 (*)    BUN 4 (*)    Creatinine, Ser 0.49 (*)    All other components within normal limits  URINALYSIS, ROUTINE W REFLEX MICROSCOPIC - Abnormal; Notable for the following:    APPearance TURBID (*)    pH >9.0 (*)    Protein, ur 30 (*)    Leukocytes, UA TRACE (*)    All other components within normal limits  URINE MICROSCOPIC-ADD ON - Abnormal; Notable for the following:    Squamous Epithelial / LPF FEW (*)    Bacteria, UA FEW (*)    All other components within normal limits  PREGNANCY, URINE - Abnormal; Notable for the following:    Preg Test, Ur POSITIVE (*)    All other components within normal limits  HCG, QUANTITATIVE, PREGNANCY - Abnormal; Notable for the following:    hCG, Beta Chain, Quant, S 9593 (*)    All other components within normal limits  URINE RAPID DRUG SCREEN (HOSP PERFORMED) - Abnormal; Notable for the following:    Tetrahydrocannabinol POSITIVE (*)    All other components within normal limits  ETHANOL   Imaging Review No results found.  MDM   1. Hyperemesis gravidarum with dehydration   2. Pregnant     Patient is currently [redacted] weeks pregnant female presenting to the emergency department with emesis that has been ongoing since Jun 11, 2014when patient was pronounced be pregnant. Patient reported that this is her third pregnancy, reported that she had similar issues ongoing emesis and nausea with her first pregnancy. Alert and oriented. Lungs clear to auscultation bilaterally. Heart rate and rhythm normal. Pulses palpable, proximal and distal. Mild discomfort upon palpation to the abdomen, generalized-negative guarding or rebound noted-negative acute abdomen or peritoneal signs. Bowel sounds normoactive in all 4 quadrants. Mild decrease in strength secondary to  patient being dehydrated. Dry mucous membranes noted. CBC negative elevation white blood cell count noted. CMP noted hyponatremia and hypokalemia. Sodium is 134 potassium is 3.4. Urine negative for nitrites and blood - negative signs for infection. Urine drug screen positive for cannabis. Negative elevation in alcohol level.  Fetal HR performed - 153 bpm.  Patient given numerous rounds of fluids - given Zofran, Phenergan, and Reglan in ED setting. Phenergan recommended by Dr. Debroah Loop, OBGYN, when OBGYN consulted regarding patient. Nausea and emesis not controlled. Patient unable to tolerate anything PO. Discussed case with Dr. Sarina Ser who agreed to transfer to [facility]. Discussed case with Dr. Emelda Fear from [facility], agreed for  [facility] dehydration and hyperemesis gravidarum. Discussed with patient that she will be transferred to [facility] to be monitored - patient agreed. Patient stable for transfer.    Raymon Mutton, PA-C 09/30/12 1812

## 2012-09-30 NOTE — H&P (Signed)
Dawn Brock is a 27 y.o. female, gravida 3 para 2002 presenting for worsened nausea vomiting  x2 days, with history of persistent pregnancy related nausea and vomiting daily times several weeks. Patient is nonetheless gained 7 pounds during the pregnancy. Patient denies in the febrile illness, change in overall health, exposure to ill family members or friends. She was seen in [facility], received 7 hours of fluids therapy, and is admitted here for overnight continued rehydration. Her only abdominal complaints are of soreness from the vomiting. She denies abdominal pain. Maternal Medical History:  Reason for admission: Nausea.     Pregnancy course has been notable for an initially unplanned pregnancy which is now decided to keep, with the patient seen yesterday 09/29/2012 the [facility] for initial pregnancy evaluation at which time gonorrhea Chlamydia cultures initial prenatal labs etc. were obtained and therefore will not be repeated. Those records not yet available. Labs from emergency room evaluation this morning include BUN 4 creatinine 0.9 normal white count 7400 hemoglobin 12.5, essentially unchanged from 07/02/2012 , With no leukocytosis. Blood type is O+ She has not had an ultrasound for anatomy scan and this will be obtained tomorrow, OB History   Grav Para Term Preterm Abortions TAB SAB Ect Mult Living   3 2 2  0 0 0 0 0 0 2     Past Medical History  Diagnosis Date  . Abnormal Pap smear   . Urinary tract infection   . Gastritis    Past Surgical History  Procedure Laterality Date  . Leep    . Leep    . Hernia repair      As an infant   Family History: family history includes Cirrhosis in her father; Hypertension in her mother. Social History:  reports that she has been smoking Cigarettes.  She has a 14 pack-year smoking history. She has never used smokeless tobacco. She reports that she drinks about 7.2 ounces of alcohol per week. She reports  that she uses illicit drugs (Cocaine and Marijuana).   Prenatal Transfer Tool  Maternal Diabetes:  Genetic Screening:  Maternal Ultrasounds/Referrals:  Fetal Ultrasounds or other Referrals:   Maternal Substance Abuse:  UDS positive for marijuana today, positive for marijuana and cocaine repeatedly earlier in 2014 Significant Maternal Medications:   Significant Maternal Lab Results:   Other Comments:    Review of Systems  Constitutional: Positive for chills and malaise/fatigue. Negative for fever and weight loss.  Gastrointestinal: Positive for nausea and vomiting. Negative for heartburn, abdominal pain, diarrhea and constipation.  Genitourinary: Negative for dysuria, urgency and frequency.  Neurological: Positive for weakness. Negative for headaches.      Blood pressure 98/77, pulse 82, temperature 98 F (36.7 C), temperature source Oral, resp. rate 18, height 5' 6.5" (1.689 m), weight 76.658 kg (169 lb), last menstrual period 05/03/2012, SpO2 100.00%. Exam Physical Exam  Constitutional: She is oriented to person, place, and time. She appears well-developed and well-nourished.  HENT:  Head: Normocephalic.  Eyes: Pupils are equal, round, and reactive to light.  Neck: Normal range of motion. Neck supple. No thyromegaly present.  Cardiovascular: Normal rate and regular rhythm.   Respiratory: Effort normal.  GI: Soft. She exhibits no distension and no mass. There is no tenderness. There is no rebound and no guarding.  Gravid uterus nontender at U-2cm  Musculoskeletal: Normal range of motion.  Neurological: She is alert and oriented to person, place, and time.  Skin: Skin is warm and dry.  Prenatal labs: ABO, Rh: --/--/O POS (06/21 2011) Antibody:   Rubella:   RPR:    HBsAg:    HIV:    GBS:     Assessment/Plan: Pregnancy 20w 4days Nausea and vomiting of pregnancy, minimal dehydration Hx substance abuse, THC, cocaine, currently negative for cocaine on  uds Plan: Continue rehydration overnite, check tsh Anticipate brief hospitalization, and outpt tx with phenergan   Alichia Alridge V 09/30/2012, 9:28 PM

## 2012-09-30 NOTE — ED Notes (Signed)
Placed patient on bedpan

## 2012-10-01 ENCOUNTER — Encounter (HOSPITAL_COMMUNITY): Payer: Self-pay | Admitting: *Deleted

## 2012-10-01 LAB — CBC WITH DIFFERENTIAL/PLATELET
Basophils Absolute: 0 10*3/uL (ref 0.0–0.1)
Basophils Relative: 0 % (ref 0–1)
Eosinophils Absolute: 0 10*3/uL (ref 0.0–0.7)
Eosinophils Relative: 0 % (ref 0–5)
Hemoglobin: 10.5 g/dL — ABNORMAL LOW (ref 12.0–15.0)
MCH: 32.7 pg (ref 26.0–34.0)
MCHC: 35.1 g/dL (ref 30.0–36.0)
Monocytes Relative: 5 % (ref 3–12)
Neutro Abs: 12.1 10*3/uL — ABNORMAL HIGH (ref 1.7–7.7)
Neutrophils Relative %: 81 % — ABNORMAL HIGH (ref 43–77)
Platelets: 217 10*3/uL (ref 150–400)

## 2012-10-01 LAB — CBC
HCT: 30.6 % — ABNORMAL LOW (ref 36.0–46.0)
Hemoglobin: 10.6 g/dL — ABNORMAL LOW (ref 12.0–15.0)
MCH: 32 pg (ref 26.0–34.0)
MCHC: 34.6 g/dL (ref 30.0–36.0)
MCV: 92.4 fL (ref 78.0–100.0)
RDW: 13 % (ref 11.5–15.5)

## 2012-10-01 MED ORDER — ONDANSETRON HCL 4 MG/2ML IJ SOLN
4.0000 mg | Freq: Four times a day (QID) | INTRAMUSCULAR | Status: DC | PRN
  Administered 2012-10-01 – 2012-10-03 (×5): 4 mg via INTRAVENOUS
  Filled 2012-10-01 (×5): qty 2

## 2012-10-01 MED ORDER — DEXTROSE-NACL 5-0.45 % IV SOLN
INTRAVENOUS | Status: DC
  Administered 2012-10-01 – 2012-10-04 (×8): via INTRAVENOUS

## 2012-10-01 MED ORDER — INFLUENZA VAC SPLIT QUAD 0.5 ML IM SUSP
0.5000 mL | INTRAMUSCULAR | Status: AC
  Administered 2012-10-02: 0.5 mL via INTRAMUSCULAR
  Filled 2012-10-01: qty 0.5

## 2012-10-01 MED ORDER — PANTOPRAZOLE SODIUM 40 MG IV SOLR
40.0000 mg | INTRAVENOUS | Status: DC
  Administered 2012-10-01 – 2012-10-03 (×3): 40 mg via INTRAVENOUS
  Filled 2012-10-01 (×5): qty 40

## 2012-10-01 NOTE — Progress Notes (Signed)
FACULTY PRACTICE ANTEPARTUM(COMPREHENSIVE) NOTE  Dawn Brock is a 27 y.o. G3P2002 at [redacted]w[redacted]d by early ultrasound who is admitted for hyperemesis/nausea and vomiting of pregnancy.   Fetal presentation is unsure. Anatomy survey ultrasound scheduled today Length of Stay:  1  Days  Subjective: Patient continues to be nauseated with lots of reflux symptoms Will treat with Reglan and Protonix. Today we switched her to Zofran as the Phenergan was not particularly effective Patient reports the fetal movement as Present. Patient reports uterine contraction  activity as none. Patient reports  vaginal bleeding as none. Patient describes fluid per vagina as None.  Vitals:  Blood pressure 104/48, pulse 70, temperature 97.7 F (36.5 C), temperature source Oral, resp. rate 16, height 5' 6.5" (1.689 m), weight 76.658 kg (169 lb), last menstrual period 05/03/2012, SpO2 100.00%. Physical Examination:  General appearance - alert, well appearing, and in no distress, well hydrated and ill-appearing Heart - normal rate and regular rhythm Abdomen - soft, nontender, nondistended no guarding or rebound nontender gravid uterus to below the umbilicus Fundal Height:  size equals dates Cervical Exam: Not evaluated. . Extremities: extremities normal, atraumatic, no cyanosis or edema and Homans sign is negative, no sign of DVT with DTRs 2+ bilaterally   Fetal Monitoring:  Anatomy scan this morning  Labs:  Results for orders placed during the hospital encounter of 09/30/12 (from the past 24 hour(s))  HCG, QUANTITATIVE, PREGNANCY   Collection Time    09/30/12 11:38 AM      Result Value Range   hCG, Beta Chain, Quant, S 9593 (*) <5 mIU/mL  URINE RAPID DRUG SCREEN (HOSP PERFORMED)   Collection Time    09/30/12  3:25 PM      Result Value Range   Opiates NONE DETECTED  NONE DETECTED   Cocaine NONE DETECTED  NONE DETECTED   Benzodiazepines NONE DETECTED  NONE DETECTED   Amphetamines NONE DETECTED  NONE  DETECTED   Tetrahydrocannabinol POSITIVE (*) NONE DETECTED   Barbiturates NONE DETECTED  NONE DETECTED  ETHANOL   Collection Time    09/30/12  3:44 PM      Result Value Range   Alcohol, Ethyl (B) <11  0 - 11 mg/dL  CBC   Collection Time    10/01/12  5:03 AM      Result Value Range   WBC 14.7 (*) 4.0 - 10.5 K/uL   RBC 3.31 (*) 3.87 - 5.11 MIL/uL   Hemoglobin 10.6 (*) 12.0 - 15.0 g/dL   HCT 16.1 (*) 09.6 - 04.5 %   MCV 92.4  78.0 - 100.0 fL   MCH 32.0  26.0 - 34.0 pg   MCHC 34.6  30.0 - 36.0 g/dL   RDW 40.9  81.1 - 91.4 %   Platelets 215  150 - 400 K/uL  CBC WITH DIFFERENTIAL   Collection Time    10/01/12  5:03 AM      Result Value Range   WBC 15.0 (*) 4.0 - 10.5 K/uL   RBC 3.21 (*) 3.87 - 5.11 MIL/uL   Hemoglobin 10.5 (*) 12.0 - 15.0 g/dL   HCT 78.2 (*) 95.6 - 21.3 %   MCV 93.1  78.0 - 100.0 fL   MCH 32.7  26.0 - 34.0 pg   MCHC 35.1  30.0 - 36.0 g/dL   RDW 08.6  57.8 - 46.9 %   Platelets 217  150 - 400 K/uL   Neutrophils Relative % 81 (*) 43 - 77 %   Neutro Abs 12.1 (*)  1.7 - 7.7 K/uL   Lymphocytes Relative 14  12 - 46 %   Lymphs Abs 2.2  0.7 - 4.0 K/uL   Monocytes Relative 5  3 - 12 %   Monocytes Absolute 0.8  0.1 - 1.0 K/uL   Eosinophils Relative 0  0 - 5 %   Eosinophils Absolute 0.0  0.0 - 0.7 K/uL   Basophils Relative 0  0 - 1 %   Basophils Absolute 0.0  0.0 - 0.1 K/uL    Imaging Studies:     Currently EPIC will not allow sonographic studies to automatically populate into notes.  In the meantime, copy and paste results into note or free text.  Medications:  Scheduled . docusate sodium  100 mg Oral Daily  . pantoprazole (PROTONIX) IV  40 mg Intravenous Q24H  . prenatal multivitamin  1 tablet Oral Q1200   I have reviewed the patient's current medications.  ASSESSMENT: Patient Active Problem List   Diagnosis Date Noted  . Hyperemesis gravidarum with dehydration 09/30/2012  . Alcohol dependence 03/12/2012  . Cannabis dependence 03/12/2012  . Executive  function deficit 03/11/2012    Class: Acute  . Cigarette smoker 03/10/2012  . Duodenitis with nausea, vomiting, diarrhea and abdominal pain 03/10/2012  . Alcohol abuse 03/10/2012  . Hypokalemia 01/26/2012  . UTI (lower urinary tract infection) 01/26/2012    PLAN: Switch IV to D5 half-normal saline, eliminate potassium due to IV burning And Protonix and Reglan, switched to Zofran Continue hydration and probable discharge later in the weekend  Cira Deyoe V 10/01/2012,9:49 AM

## 2012-10-02 DIAGNOSIS — O211 Hyperemesis gravidarum with metabolic disturbance: Principal | ICD-10-CM

## 2012-10-02 MED ORDER — METOCLOPRAMIDE HCL 5 MG/ML IJ SOLN
10.0000 mg | Freq: Four times a day (QID) | INTRAMUSCULAR | Status: DC
  Administered 2012-10-02 – 2012-10-04 (×8): 10 mg via INTRAVENOUS
  Filled 2012-10-02 (×9): qty 2

## 2012-10-02 MED ORDER — BISACODYL 10 MG RE SUPP: 10.0000 mg | Freq: Every day | RECTAL | Status: DC | PRN

## 2012-10-02 NOTE — Progress Notes (Signed)
Patient ID: Dawn Brock, female   DOB: 1985-10-06, 27 y.o.   MRN: [id number] FACULTY PRACTICE ANTEPARTUM(COMPREHENSIVE) NOTE  Dawn Brock is a 27 y.o. G3P2002 at [redacted]w[redacted]d by early ultrasound who is admitted for hyperemesis gravidarum.   Fetal presentation is unsure. Length of Stay:  2  Days  Subjective: Reflux, nausea, hiccough  Patient reports the fetal movement as active. Patient reports uterine contraction  activity as none. Patient reports  vaginal bleeding as none. Patient describes fluid per vagina as None.  Vitals:  Blood pressure 123/62, pulse 75, temperature 97.9 F (36.6 C), temperature source Oral, resp. rate 18, height 5' 6.5" (1.689 m), weight 170 lb (77.111 kg), last menstrual period 05/03/2012, SpO2 100.00%. Physical Examination:  General appearance - alert, well appearing, and in no distress Heart - normal rate and regular rhythm Abdomen - soft, nontender, nondistended Fundal Height:  size equals dates Cervical Exam: Not evaluated. Extremities: extremities normal, atraumatic, no cyanosis or edema and Homans sign is negative, no sign of DVT  Membranes:intact  Fetal Monitoring:  positive FHT  Labs:  No results found for this or any previous visit (from the past 24 hour(s)). CBC    Component Value Date/Time   WBC 14.7* 10/01/2012 0503   WBC 15.0* 10/01/2012 0503   RBC 3.31* 10/01/2012 0503   RBC 3.21* 10/01/2012 0503   HGB 10.6* 10/01/2012 0503   HGB 10.5* 10/01/2012 0503   HCT 30.6* 10/01/2012 0503   HCT 29.9* 10/01/2012 0503   PLT 215 10/01/2012 0503   PLT 217 10/01/2012 0503   MCV 92.4 10/01/2012 0503   MCV 93.1 10/01/2012 0503   MCH 32.0 10/01/2012 0503   MCH 32.7 10/01/2012 0503   MCHC 34.6 10/01/2012 0503   MCHC 35.1 10/01/2012 0503   RDW 13.0 10/01/2012 0503   RDW 13.1 10/01/2012 0503   LYMPHSABS 2.2 10/01/2012 0503   MONOABS 0.8 10/01/2012 0503   EOSABS 0.0 10/01/2012 0503   BASOSABS 0.0 10/01/2012 0503   CMP     Component Value Date/Time   NA  134* 09/30/2012 0902   K 3.4* 09/30/2012 0902   CL 99 09/30/2012 0902   CO2 23 09/30/2012 0902   GLUCOSE 92 09/30/2012 0902   BUN 4* 09/30/2012 0902   CREATININE 0.49* 09/30/2012 0902   CALCIUM 9.3 09/30/2012 0902   PROT 7.6 09/30/2012 0902   ALBUMIN 3.6 09/30/2012 0902   AST 15 09/30/2012 0902   ALT 8 09/30/2012 0902   ALKPHOS 78 09/30/2012 0902   BILITOT 0.3 09/30/2012 0902   GFRNONAA >90 09/30/2012 0902   GFRAA >90 09/30/2012 0902      Imaging Studies:    Currently EPIC will not allow sonographic studies to automatically populate into notes.  In the meantime, copy and paste results into note or free text.  Medications:  Scheduled . docusate sodium  100 mg Oral Daily  . influenza vac split quadrivalent PF  0.5 mL Intramuscular Tomorrow-1000  . pantoprazole (PROTONIX) IV  40 mg Intravenous Q24H  . prenatal multivitamin  1 tablet Oral Q1200  Current facility-administered medications:acetaminophen (TYLENOL) tablet 650 mg, 650 mg, Oral, Q4H PRN, Tilda Burrow, MD;  calcium carbonate (TUMS - dosed in mg elemental calcium) chewable tablet 400 mg of elemental calcium, 2 tablet, Oral, Q4H PRN, Tilda Burrow, MD, 400 mg of elemental calcium at 10/01/12 0307;  dextrose 5 %-0.45 % sodium chloride infusion, , Intravenous, Continuous, Tilda Burrow, MD, Last Rate: 125 mL/hr at 10/02/12 0151 docusate sodium (COLACE)  capsule 100 mg, 100 mg, Oral, Daily, Tilda Burrow, MD;  influenza vac split quadrivalent PF (FLUARIX) injection 0.5 mL, 0.5 mL, Intramuscular, Tomorrow-1000, Adam Phenix, MD;  ondansetron [facility]) injection 4 mg, 4 mg, Intravenous, Q6H PRN, Tilda Burrow, MD, 4 mg at 10/01/12 1540;  pantoprazole (PROTONIX) injection 40 mg, 40 mg, Intravenous, Q24H, Tilda Burrow, MD, 40 mg at 10/01/12 1914 prenatal multivitamin tablet 1 tablet, 1 tablet, Oral, Q1200, Tilda Burrow, MD;  promethazine (PHENERGAN) injection 12.5 mg, 12.5 mg, Intravenous, Q3H PRN, Tilda Burrow, MD, 12.5 mg at 10/02/12  0152;  zolpidem (AMBIEN) tablet 5 mg, 5 mg, Oral, QHS PRN, Tilda Burrow, MD, 5 mg at 09/30/12 2218  I have reviewed the patient's current medications.  ASSESSMENT: Patient Active Problem List   Diagnosis Date Noted  . Hyperemesis gravidarum with dehydration 09/30/2012  . Alcohol dependence 03/12/2012  . Cannabis dependence 03/12/2012  . Executive function deficit 03/11/2012    Class: Acute  . Cigarette smoker 03/10/2012  . Duodenitis with nausea, vomiting, diarrhea and abdominal pain 03/10/2012  . Alcohol abuse 03/10/2012  . Hypokalemia 01/26/2012  . UTI (lower urinary tract infection) 01/26/2012    PLAN: Add reglan. Discourage marijuana use as this may cause nausea with chronic use  Dawn Brock 10/02/2012,6:49 AM

## 2012-10-02 NOTE — ED Provider Notes (Signed)
Medical screening examination/treatment/procedure(s) were conducted as a shared visit with non-physician practitioner(s) and myself.  I personally evaluated the patient during the encounter.   Please see my separate note.    Candyce Churn, MD 10/02/12 [phone]

## 2012-10-02 NOTE — ED Provider Notes (Signed)
Medical screening examination/treatment/procedure(s) were conducted as a shared visit with non-physician practitioner(s) and myself.  I personally evaluated the patient during the encounter  27 yo female at approximately [redacted] weeks gestation with nausea and vomiting.  On exam, no distress but appears somewhat uncomfortable, mildly dehydrated, abdomen soft and nontender.  Attempts at nausea control were unsuccessful in ED despite multiple IV meds and fluids.  Transferred to [facility] for admission.  Clinical Impression: 1. Hyperemesis gravidarum with dehydration   2. Pregnant       Candyce Churn, MD 10/02/12 [phone]

## 2012-10-03 ENCOUNTER — Ambulatory Visit (HOSPITAL_COMMUNITY): Admission: RE | Admit: 2012-10-03 | Payer: Medicaid Other | Source: Ambulatory Visit

## 2012-10-03 ENCOUNTER — Inpatient Hospital Stay (HOSPITAL_COMMUNITY): Payer: Medicaid Other

## 2012-10-03 LAB — URINE CULTURE: Colony Count: 65000

## 2012-10-03 MED ORDER — PYRIDOXINE HCL 100 MG/ML IJ SOLN
100.0000 mg | Freq: Every day | INTRAMUSCULAR | Status: DC
  Administered 2012-10-03: 100 mg via INTRAVENOUS
  Filled 2012-10-03 (×3): qty 1

## 2012-10-03 NOTE — Progress Notes (Signed)
Patient ID: Dawn Brock, female   DOB: 1985-09-20, 27 y.o.   MRN: [id number] FACULTY PRACTICE ANTEPARTUM(COMPREHENSIVE) NOTE  Dawn Brock is a 27 y.o. G3P2002 at [redacted]w[redacted]d by early ultrasound who is admitted for hyperemesis gravidarum.   Fetal presentation is unsure. Length of Stay:  3  Days  Subjective: Patient reports some emesis overnight. She was able to tolerate some juice this morning but was not able to eat her cereal. Patient reports the fetal movement as active. Patient reports uterine contraction  activity as none. Patient reports  vaginal bleeding as none. Patient describes fluid per vagina as None.  Vitals:  Blood pressure 117/59, pulse 70, temperature 98.1 F (36.7 C), temperature source Oral, resp. rate 18, height 5' 6.5" (1.689 m), weight 169 lb 15.6 oz (77.1 kg), last menstrual period 05/03/2012, SpO2 100.00%. Physical Examination:  General appearance - alert, well appearing, and in no distress Heart - normal rate and regular rhythm Abdomen - soft, nontender, nondistended Fundal Height:  size equals dates Cervical Exam: Not evaluated. Extremities: extremities normal, atraumatic, no cyanosis or edema and Homans sign is negative, no sign of DVT  Membranes:intact  Fetal Monitoring:  positive FHT  Labs:  No results found for this or any previous visit (from the past 24 hour(s)). CBC    Component Value Date/Time   WBC 14.7* 10/01/2012 0503   WBC 15.0* 10/01/2012 0503   RBC 3.31* 10/01/2012 0503   RBC 3.21* 10/01/2012 0503   HGB 10.6* 10/01/2012 0503   HGB 10.5* 10/01/2012 0503   HCT 30.6* 10/01/2012 0503   HCT 29.9* 10/01/2012 0503   PLT 215 10/01/2012 0503   PLT 217 10/01/2012 0503   MCV 92.4 10/01/2012 0503   MCV 93.1 10/01/2012 0503   MCH 32.0 10/01/2012 0503   MCH 32.7 10/01/2012 0503   MCHC 34.6 10/01/2012 0503   MCHC 35.1 10/01/2012 0503   RDW 13.0 10/01/2012 0503   RDW 13.1 10/01/2012 0503   LYMPHSABS 2.2 10/01/2012 0503   MONOABS 0.8 10/01/2012 0503   EOSABS 0.0 10/01/2012 0503   BASOSABS 0.0 10/01/2012 0503   CMP     Component Value Date/Time   NA 134* 09/30/2012 0902   K 3.4* 09/30/2012 0902   CL 99 09/30/2012 0902   CO2 23 09/30/2012 0902   GLUCOSE 92 09/30/2012 0902   BUN 4* 09/30/2012 0902   CREATININE 0.49* 09/30/2012 0902   CALCIUM 9.3 09/30/2012 0902   PROT 7.6 09/30/2012 0902   ALBUMIN 3.6 09/30/2012 0902   AST 15 09/30/2012 0902   ALT 8 09/30/2012 0902   ALKPHOS 78 09/30/2012 0902   BILITOT 0.3 09/30/2012 0902   GFRNONAA >90 09/30/2012 0902   GFRAA >90 09/30/2012 0902      Imaging Studies:    Currently EPIC will not allow sonographic studies to automatically populate into notes.  In the meantime, copy and paste results into note or free text.  Medications:  Scheduled . docusate sodium  100 mg Oral Daily  . metoCLOPramide (REGLAN) injection  10 mg Intravenous Q6H  . pantoprazole (PROTONIX) IV  40 mg Intravenous Q24H  . prenatal multivitamin  1 tablet Oral Q1200  Current facility-administered medications:acetaminophen (TYLENOL) tablet 650 mg, 650 mg, Oral, Q4H PRN, Dawn Burrow, MD;  bisacodyl (DULCOLAX) suppository 10 mg, 10 mg, Rectal, Daily PRN, Dawn Phenix, MD;  calcium carbonate (TUMS - dosed in mg elemental calcium) chewable tablet 400 mg of elemental calcium, 2 tablet, Oral, Q4H PRN, Dawn Burrow, MD, 400  mg of elemental calcium at 10/01/12 0307 dextrose 5 %-0.45 % sodium chloride infusion, , Intravenous, Continuous, Dawn Burrow, MD, Last Rate: 125 mL/hr at 10/03/12 0223;  docusate sodium (COLACE) capsule 100 mg, 100 mg, Oral, Daily, Dawn Burrow, MD;  metoCLOPramide (REGLAN) injection 10 mg, 10 mg, Intravenous, Q6H, Dawn Phenix, MD, 10 mg at 10/03/12 0517;  ondansetron [facility]) injection 4 mg, 4 mg, Intravenous, Q6H PRN, Dawn Burrow, MD, 4 mg at 10/02/12 1835 pantoprazole (PROTONIX) injection 40 mg, 40 mg, Intravenous, Q24H, Dawn Burrow, MD, 40 mg at 10/02/12 1010;  prenatal multivitamin tablet 1  tablet, 1 tablet, Oral, Q1200, Dawn Burrow, MD;  promethazine (PHENERGAN) injection 12.5 mg, 12.5 mg, Intravenous, Q3H PRN, Dawn Burrow, MD, 12.5 mg at 10/02/12 1611;  zolpidem (AMBIEN) tablet 5 mg, 5 mg, Oral, QHS PRN, Dawn Burrow, MD, 5 mg at 09/30/12 2218  I have reviewed the patient's current medications.  ASSESSMENT: Patient Active Problem List   Diagnosis Date Noted  . Hyperemesis gravidarum with dehydration 09/30/2012  . Alcohol dependence 03/12/2012  . Cannabis dependence 03/12/2012  . Executive function deficit 03/11/2012    Class: Acute  . Cigarette smoker 03/10/2012  . Duodenitis with nausea, vomiting, diarrhea and abdominal pain 03/10/2012  . Alcohol abuse 03/10/2012  . Hypokalemia 01/26/2012  . UTI (lower urinary tract infection) 01/26/2012    PLAN: Continue antiemetic Continue to encourage po intake Patient scheduled for outpatient anatomy US today, will obtain while inpatient Continue current care  Dawn Brock 10/03/2012,9:57 AM

## 2012-10-03 NOTE — Progress Notes (Signed)
INITIAL NUTRITION ASSESSMENT  DOCUMENTATION CODES Per approved criteria  -Not Applicable   INTERVENTION: Antenatal Regular Diet  NUTRITION DIAGNOSIS: Inadequate oral intake  related to Hyperemesis  as evidenced by N/V, 4% loss of usual wt.   Goal: Tolerance of regular diet, weight gain  Monitor:  Po tol  Reason for Assessment: Hyperemesis  27 y.o. female  Admitting Dx: <principal problem not specified>  ASSESSMENT: Minimal po intake. 7 Lb weight loss over the past 8 weeks. Symptoms since 7 - [redacted] weeks pregnant  Height: Ht Readings from Last 1 Encounters:  09/30/12 5' 6.5" (1.689 m)    Weight: Wt Readings from Last 1 Encounters:  10/03/12 169 lb 15.6 oz (77.1 kg)    Ideal Body Weight: 135 lbs  % Ideal Body Weight: 125%  Wt Readings from Last 10 Encounters:  10/03/12 169 lb 15.6 oz (77.1 kg)  07/25/12 172 lb (78.019 kg)  07/02/12 176 lb (79.833 kg)  03/10/12 171 lb 15.3 oz (78 kg)  01/30/12 163 lb 5.8 oz (74.1 kg)  12/05/11 166 lb (75.297 kg)  11/24/11 166 lb (75.297 kg)  09/07/11 163 lb (73.936 kg)  06/14/11 155 lb (70.308 kg)  08/09/10 163 lb 6 oz (74.106 kg)    Usual Body Weight: 176 lbs  % Usual Body Weight: 96%  BMI:  Body mass index is 27.03 kg/(m^2).  Estimated Nutritional Needs: Kcal: 2000-2200 Protein: 70-80 g Fluid: 2.3L    Diet Order: General  EDUCATION NEEDS: -No education needs identified at this time   Intake/Output Summary (Last 24 hours) at 10/03/12 1405 Last data filed at 10/03/12 1307  Gross per 24 hour  Intake 2580.83 ml  Output   2300 ml  Net 280.83 ml     Labs:   Recent Labs Lab 09/30/12 0902  NA 134*  K 3.4*  CL 99  CO2 23  BUN 4*  CREATININE 0.49*  CALCIUM 9.3  GLUCOSE 92    CBG (last 3)  No results found for this basename: GLUCAP,  in the last 72 hours  Scheduled Meds: . docusate sodium  100 mg Oral Daily  . metoCLOPramide (REGLAN) injection  10 mg Intravenous Q6H  . pantoprazole (PROTONIX) IV   40 mg Intravenous Q24H  . prenatal multivitamin  1 tablet Oral Q1200  . pyridOXINE  100 mg Intravenous Daily    Continuous Infusions: . dextrose 5 % and 0.45% NaCl 125 mL/hr at 10/03/12 1610    Past Medical History  Diagnosis Date  . Abnormal Pap smear   . Urinary tract infection   . Gastritis     Past Surgical History  Procedure Laterality Date  . Leep    . Leep    . Hernia repair      As an infant    Inez Pilgrim.Odis Luster LDN Neonatal Nutrition Support Specialist Pager [phone]

## 2012-10-04 MED ORDER — METOCLOPRAMIDE HCL 5 MG PO TABS: 5.0000 mg | ORAL_TABLET | Freq: Four times a day (QID) | ORAL | Status: DC

## 2012-10-04 MED ORDER — ONDANSETRON 4 MG PO TBDP: 4.0000 mg | ORAL_TABLET | Freq: Four times a day (QID) | ORAL | Status: DC | PRN

## 2012-10-04 MED ORDER — VITAMIN B-6 50 MG PO TABS: 50.0000 mg | ORAL_TABLET | Freq: Three times a day (TID) | ORAL | Status: DC

## 2012-10-04 NOTE — Discharge Summary (Signed)
Physician Discharge Summary  Patient ID: JERMIKA OLDEN MRN: [id number] DOB/AGE: March 05, 1985 26 y.o.  Admit date: 09/30/2012 Discharge date: 10/04/2012  Admission Diagnoses: hyperemesis gravidarum  Discharge Diagnoses:  Active Problems:   Hyperemesis gravidarum with dehydration   Discharged Condition: good  Hospital Course: Pt was admitted for nausea and vomiting in pregnancy.  She has had  previous MAU visits for the same sx.  She reports that her last bout with emesis was over 3 days ago.  She was treated with antiemetics, Reglan and Vit B6 with resolution of sx.   She ate mac and cheese last night and [place] toast, bacon, a muffin and a boiled egg this am with no nausea or emesis. She feels 'ready to go home' and is 'happy to be alive.'   Consults: None  Significant Diagnostic Studies: labs: CBC, BMP  Treatments: IV hydration and antiemetics   Discharge Exam: Blood pressure 106/53, pulse 67, temperature 98 F (36.7 C), temperature source Oral, resp. rate 18, height 5' 6.5" (1.689 m), weight 168 lb 8 oz (76.431 kg), last menstrual period 05/03/2012, SpO2 100.00%. General appearance: alert and no distress GI: soft, non-tender; bowel sounds normal; no masses,  no organomegaly and gravid  Disposition: discharged to home  Discharge Orders   Future Orders Complete By Expires   Discharge activity:  No Restrictions  As directed    Discharge diet:  No restrictions  As directed    No sexual activity restrictions  As directed        Medication List         metoCLOPramide 5 MG tablet  Commonly known as:  REGLAN  Take 1 tablet (5 mg total) by mouth 4 (four) times daily.     ondansetron 4 MG disintegrating tablet  Commonly known as:  ZOFRAN ODT  Take 1 tablet (4 mg total) by mouth every 6 (six) hours as needed for nausea.     pyridOXINE 50 MG tablet  Commonly known as:  VITAMIN B-6  Take 1 tablet (50 mg total) by mouth 3 (three) times daily.           Follow-up  Information   Follow up with [facility] [place] In 3 days.   Contact information:   [address] [place] [address] [phone]    D/w pt diet that may be better tolerated. She already has an appt this Friday at the [facility].  Pt encouraged to keep that appt.   SignedWillodean Rosenthal 10/04/2012, 9:38 AM

## 2012-10-04 NOTE — Progress Notes (Signed)
Pt discharged to home with significant other.  Condition stable.  Pt ambulated to car with V.Pittman, NT.  No equipment for home ordered at discharge.

## 2012-11-08 ENCOUNTER — Telehealth: Payer: Self-pay | Admitting: *Deleted

## 2012-11-08 MED ORDER — ONDANSETRON 4 MG PO TBDP: 4.0000 mg | ORAL_TABLET | Freq: Four times a day (QID) | ORAL | Status: DC | PRN

## 2012-11-08 NOTE — Telephone Encounter (Signed)
Received message from [facility] pharmacy for refill request of Zofran. Pt receives prenatal care at [facility]. Consult w/Dr. Jolayne Panther- refill approved.  Rx called to pharmacist @ [facility].

## 2013-01-12 NOTE — L&D Delivery Note (Signed)
Delivery Note At 3:56 PM a viable female was delivered via Vaginal, Spontaneous Delivery (Presentation: Left Occiput Anterior).  APGAR: 5, 7; weight 8 lb 2 oz (3685 g).   Placenta status: Intact, Spontaneous Pathology.  Cord: 3 vessels with the following complications: None.  Cord pH: 7.26  Anesthesia: Epidural  Episiotomy: None Lacerations: None Suture Repair: na Est. Blood Loss (mL): 500  Mom to postpartum.  Baby to Couplet care / Skin to Skin.  Pt progressed to complete and pushed with good maternal effort to deliver a viable female via NSVD. Slow delivery of the head requiring multiple pushes but no shoulder dystocia.  Baby delivered with floppy tone but spontaneous cry. Terminal meconium noted.  Cord was clamped and cut rapidly and baby was taken to the radiant warmer for stimulation.  BBO2 was administered. Apgars of 5 and 7.  Placenta delivered spontaneously intact with gentle traction and 3V cord.  No tears.  Initially firm fundus that then relaxed with multiple clots.  Cytotec 1000mg  PR administered and massage done.  No complications and bleeding resolved.  EBL 500cc.    Kainat Pizana L 02/22/2013, 5:32 PM

## 2013-01-20 LAB — OB RESULTS CONSOLE GBS: GBS: NEGATIVE

## 2013-01-31 ENCOUNTER — Inpatient Hospital Stay (HOSPITAL_COMMUNITY)
Admission: AD | Admit: 2013-01-31 | Discharge: 2013-01-31 | Disposition: A | Discharge status: Home / Self Care | Payer: Medicaid Other | Source: Ambulatory Visit | Attending: Obstetrics & Gynecology | Admitting: Obstetrics & Gynecology

## 2013-01-31 ENCOUNTER — Encounter (HOSPITAL_COMMUNITY): Payer: Self-pay

## 2013-01-31 DIAGNOSIS — J3489 Other specified disorders of nose and nasal sinuses: Secondary | ICD-10-CM | POA: Insufficient documentation

## 2013-01-31 DIAGNOSIS — R0981 Nasal congestion: Secondary | ICD-10-CM

## 2013-01-31 DIAGNOSIS — O9989 Other specified diseases and conditions complicating pregnancy, childbirth and the puerperium: Principal | ICD-10-CM

## 2013-01-31 DIAGNOSIS — O99891 Other specified diseases and conditions complicating pregnancy: Secondary | ICD-10-CM | POA: Insufficient documentation

## 2013-01-31 DIAGNOSIS — J069 Acute upper respiratory infection, unspecified: Secondary | ICD-10-CM | POA: Insufficient documentation

## 2013-01-31 NOTE — MAU Note (Addendum)
PT SAYS HER NOSE HAS BEEN CONGESTED SINCE 12-29-  SHE HAS TAKEN  SUDAFED, NITE COLD/ FLU.  TODAY- NOTHING.  SUDAFED  DOESN'T  WORK.  HURTS TO BLOW HER NOSE.   DENIES  SORE THROAT, FEVER,  VOMIT/ DIARRHEA.     NOSE IS RUNNY.  DENIES COUGH

## 2013-01-31 NOTE — MAU Provider Note (Signed)
Attestation of Attending Supervision of Advanced Practitioner (CNM/NP): Evaluation and management procedures were performed by the Advanced Practitioner under my supervision and collaboration.  I have reviewed the Advanced Practitioner's note and chart, and I agree with the management and plan.  HARRAWAY-SMITH, Stephaniemarie Stoffel 9:59 PM

## 2013-01-31 NOTE — Discharge Instructions (Signed)
Upper Respiratory Infection, Adult An upper respiratory infection (URI) is also sometimes known as the common cold. The upper respiratory tract includes the nose, sinuses, throat, trachea, and bronchi. Bronchi are the airways leading to the lungs. Most people improve within 1 week, but symptoms can last up to 2 weeks. A residual cough may last even longer.  CAUSES Many different viruses can infect the tissues lining the upper respiratory tract. The tissues become irritated and inflamed and often become very moist. Mucus production is also common. A cold is contagious. You can easily spread the virus to others by oral contact. This includes kissing, sharing a glass, coughing, or sneezing. Touching your mouth or nose and then touching a surface, which is then touched by another person, can also spread the virus. SYMPTOMS  Symptoms typically develop 1 to 3 days after you come in contact with a cold virus. Symptoms vary from person to person. They may include:  Runny nose.  Sneezing.  Nasal congestion.  Sinus irritation.  Sore throat.  Loss of voice (laryngitis).  Cough.  Fatigue.  Muscle aches.  Loss of appetite.  Headache.  Low-grade fever. DIAGNOSIS  You might diagnose your own cold based on familiar symptoms, since most people get a cold 2 to 3 times a year. Your caregiver can confirm this based on your exam. Most importantly, your caregiver can check that your symptoms are not due to another disease such as strep throat, sinusitis, pneumonia, asthma, or epiglottitis. Blood tests, throat tests, and X-rays are not necessary to diagnose a common cold, but they may sometimes be helpful in excluding other more serious diseases. Your caregiver will decide if any further tests are required. RISKS AND COMPLICATIONS  You may be at risk for a more severe case of the common cold if you smoke cigarettes, have chronic heart disease (such as heart failure) or lung disease (such as asthma), or if  you have a weakened immune system. The very young and very old are also at risk for more serious infections. Bacterial sinusitis, middle ear infections, and bacterial pneumonia can complicate the common cold. The common cold can worsen asthma and chronic obstructive pulmonary disease (COPD). Sometimes, these complications can require emergency medical care and may be life-threatening. PREVENTION  The best way to protect against getting a cold is to practice good hygiene. Avoid oral or hand contact with people with cold symptoms. Wash your hands often if contact occurs. There is no clear evidence that vitamin C, vitamin E, echinacea, or exercise reduces the chance of developing a cold. However, it is always recommended to get plenty of rest and practice good nutrition. TREATMENT  Treatment is directed at relieving symptoms. There is no cure. Antibiotics are not effective, because the infection is caused by a virus, not by bacteria. Treatment may include:  Increased fluid intake. Sports drinks offer valuable electrolytes, sugars, and fluids.  Breathing heated mist or steam (vaporizer or shower).  Eating chicken soup or other clear broths, and maintaining good nutrition.  Getting plenty of rest.  Using gargles or lozenges for comfort.  Controlling fevers with ibuprofen or acetaminophen as directed by your caregiver.  Increasing usage of your inhaler if you have asthma. Zinc gel and zinc lozenges, taken in the first 24 hours of the common cold, can shorten the duration and lessen the severity of symptoms. Pain medicines may help with fever, muscle aches, and throat pain. A variety of non-prescription medicines are available to treat congestion and runny nose. Your caregiver  can make recommendations and may suggest nasal or lung inhalers for other symptoms.  HOME CARE INSTRUCTIONS   Only take over-the-counter or prescription medicines for pain, discomfort, or fever as directed by your  caregiver.  Use a warm mist humidifier or inhale steam from a shower to increase air moisture. This may keep secretions moist and make it easier to breathe.  Drink enough water and fluids to keep your urine clear or pale yellow.  Rest as needed.  Return to work when your temperature has returned to normal or as your caregiver advises. You may need to stay home longer to avoid infecting others. You can also use a face mask and careful hand washing to prevent spread of the virus. SEEK MEDICAL CARE IF:   After the first few days, you feel you are getting worse rather than better.  You need your caregiver's advice about medicines to control symptoms.  You develop chills, worsening shortness of breath, or brown or red sputum. These may be signs of pneumonia.  You develop yellow or brown nasal discharge or pain in the face, especially when you bend forward. These may be signs of sinusitis.  You develop a fever, swollen neck glands, pain with swallowing, or white areas in the back of your throat. These may be signs of strep throat. SEEK IMMEDIATE MEDICAL CARE IF:   You have a fever.  You develop severe or persistent headache, ear pain, sinus pain, or chest pain.  You develop wheezing, a prolonged cough, cough up blood, or have a change in your usual mucus (if you have chronic lung disease).  You develop sore muscles or a stiff neck. Document Released: 06/24/2000 Document Revised: 03/23/2011 Document Reviewed: 05/02/2010 [facility] Patient Information 2014 [place], [place].  Sinus rinse kit or netty pot  Dextromethorphan; Guaifenesin capsules and ER tablets What is this medicine? DEXTROMETHORPHAN; GUAIFENESIN (dex troe meth OR fan; gwye FEN e sin) is a cough suppressant, expectorant combination. It is used to provide relief from cough. This medicine will not treat an infection. This medicine may be used for other purposes; ask your health care provider or pharmacist if you have  questions. COMMON BRAND NAME(S): Alka-Seltzer Plus Max Cough, Mucus & Congestion, Alka-Seltzer Plus Mucus and Congestion, AllFen DM, Ambi, Amibid DM , Aquabid DM , Aquatab DM, Bidex-A, Bidex-DM, Cofex-DM , Coricidin HBP Chest Congetion and Cough, Duradex Forte, Duradex, Extuss LA , Fenesin DM, G-Bid DM TR, GFN 600/DM 30 , Guaidrine DM, Guaifenex DM, Guia-D , Humibid DM, Iobid DM , Mindal DM , Mucinex DM, Muco-Fen DM, Phlemex, Q-Bid DM, Relacon LAX, Respa-DM, Ru-Tuss-DM, Sudal DM, Touro DM, Tussi-Bid, Z-Cof LA, Z-Cof LAX What should I tell my health care provider before I take this medicine? They need to know if you have any of these conditions: -asthma -chronic bronchitis -emphysema -if you have taken an MAOI like Carbex, Eldepryl, Marplan, Nardil, or Parnate in last 14 days -kidney or liver disease -unable to sit up -an unusual or allergic reaction to dextromethorphan, guaifenesin, other medicines, foods, dyes, bromides, or preservatives -pregnant or trying to get pregnant -breast-feeding How should I use this medicine? Take this medicine by mouth with a full glass of water. Follow the directions on the prescription label. Do not crush or chew. Take your medicine at regular intervals. Do not take your medicine more often than directed. Talk to your pediatrician regarding the use of this medicine in children. While this drug may be prescribed for children as young as 5 years of age  for selected conditions, precautions do apply. Overdosage: If you think you have taken too much of this medicine contact a poison control center or emergency room at once. NOTE: This medicine is only for you. Do not share this medicine with others. What if I miss a dose? If you miss a dose, take it as soon as you can. If it is almost time for your next dose, take only that dose. Do not take double or extra doses. What may interact with this medicine? Do not take this medicine with any of the following  medications: -MAOIs like Carbex, Eldepryl, Marplan, Nardil, and Parnate -procarbazine This medicine may also interact with the following medications: -medicines for depression or other mental disturbances -other medicines for colds or allergy This list may not describe all possible interactions. Give your health care provider a list of all the medicines, herbs, non-prescription drugs, or dietary supplements you use. Also tell them if you smoke, drink alcohol, or use illegal drugs. Some items may interact with your medicine. What should I watch for while using this medicine? Tell your doctor if your symptoms do not improve within 5 days or if they get worse. If you have a high fever, skin rash, lasting headache, or sore throat, see your doctor. Drink 6 to 8 glasses of water daily while you are taking this medicine to help loosen mucus. You may get drowsy or dizzy. Do not drive, use machinery, or do anything that needs mental alertness until you know how this medicine affects you. Do not stand or sit up quickly, especially if you are an older patient. This reduces the risk of dizzy or fainting spells. Alcohol may interfere with the effect of this medicine. Avoid alcoholic drinks. What side effects may I notice from receiving this medicine? Side effects that you should report to your doctor or health care professional as soon as possible: -allergic reactions like skin rash, itching or hives, swelling of the face, lips, or tongue -confusion -excitement, nervousness, restlessness, or irritability -slow or troubled breathing Side effects that usually do not require medical attention (report to your doctor or health care professional if they continue or are bothersome): -headache -stomach upset This list may not describe all possible side effects. Call your doctor for medical advice about side effects. You may report side effects to FDA at 1-800-FDA-1088. Where should I keep my medicine? Keep out of  the reach of children. Store at room temperature between 15 and 30 degrees C (59 and 86 degrees F). Protect from light and moisture. Throw away any unused medicine after the expiration date. NOTE: This sheet is a summary. It may not cover all possible information. If you have questions about this medicine, talk to your doctor, pharmacist, or health care provider.  2014, [employer]vier/Gold Standard. (2007-04-21 17:31:08)

## 2013-01-31 NOTE — MAU Provider Note (Signed)
None     Chief Complaint:  No chief complaint on file.   Dawn Brock is  28 y.o. G3P2002 at 60w1dpresents complaining of No chief complaint on file. . Complains of nasal congestion and PND for last 3weeks. Pt states that it does not respond to robitussin or sinus cold/flu. No f/c, sob, n/v. D/c. No wheezing.  +FM no .lof, no vb, no ctx  Obstetrical/Gynecological History: OB History   Grav Para Term Preterm Abortions TAB SAB Ect Mult Living   _0 0 0 0 0 0 0 2     Past Medical History: Past Medical History  Diagnosis Date  . Abnormal Pap smear   . Urinary tract infection   . Gastritis     Past Surgical History: Past Surgical History  Procedure Laterality Date  . Leep    . Leep    . Hernia repair      As an infant    Family History: Family History  Problem Relation Age of Onset  . Hypertension Mother   . Cirrhosis Father     Social History: History  Substance Use Topics  . Smoking status: Current Every Day Smoker -- 0.25 packs/day for 14 years    Types: Cigarettes  . Smokeless tobacco: Never Used  . Alcohol Use: No     Comment: Not since June 2014    Allergies: No Known Allergies  Meds:  Prescriptions prior to admission  Medication Sig Dispense Refill  . Prenatal Vit-Fe Fumarate-FA (PRENATAL MULTIVITAMIN) TABS tablet Take 1 tablet by mouth daily at 12 noon.      . pseudoephedrine (SUDAFED) 30 MG tablet Take 30 mg by mouth every 4 (four) hours as needed for congestion.        Review of Systems -   Review of Systems  Constitutional: Negative for fever, chills, weight loss, malaise/fatigue and diaphoresis.   Physical Exam  Blood pressure 120/78, pulse 88, temperature 98.2 F (36.8 C), temperature source Oral, resp. rate 20, height 5' 6" (1.676 m), weight 90.323 kg (199 lb 2 oz), last menstrual period 05/03/2012. GENERAL: Well-developed, well-nourished female in no acute distress.  No sinus tenderness, no tonsilar exudate. Slight increased  LAD. LUNGS: Clear to auscultation bilaterally.  HEART: Regular rate and rhythm. ABDOMEN: Soft, nontender, nondistended, gravid.  EXTREMITIES: Nontender, no edema, 2+ distal pulses.  FHT:  Baseline rate 150s bpm   Variability moderate  Accelerations present   Decelerations none Contractions: none   Labs: No results found for this or any previous visit (from the past 24 hour(s)). Imaging Studies:  No results found.  Assessment: Dawn WROBLESKIis  28y.o. G3P2002 at 390w1dresents with Upper respiratory congestion. Likely related to either pregnancy congestion vs URI. No evidence of Sinus infection. Recommend OTC meds - handout given for safe meds in pregnancy and a nettypot vs sinus rinse kit. Pt states understanding, return for labor or fever/chills. Sob.  ODLeamon ArntYAN 1/20/20159:20 PM

## 2013-02-14 ENCOUNTER — Encounter (HOSPITAL_COMMUNITY): Payer: Self-pay | Admitting: *Deleted

## 2013-02-14 ENCOUNTER — Telehealth (HOSPITAL_COMMUNITY): Payer: Self-pay | Admitting: *Deleted

## 2013-02-14 NOTE — Telephone Encounter (Signed)
Preadmission screen  

## 2013-02-17 ENCOUNTER — Ambulatory Visit (INDEPENDENT_AMBULATORY_CARE_PROVIDER_SITE_OTHER): Payer: Medicaid Other | Admitting: *Deleted

## 2013-02-17 VITALS — BP 110/62

## 2013-02-17 DIAGNOSIS — O480 Post-term pregnancy: Secondary | ICD-10-CM

## 2013-02-17 LAB — US OB FOLLOW UP

## 2013-02-17 NOTE — Progress Notes (Signed)
P = 80   Copy of report faxed to [facility].  Pt is scheduled for IOL on 02/21/13.

## 2013-02-17 NOTE — Progress Notes (Signed)
NST reviewed and  Category I

## 2013-02-21 ENCOUNTER — Encounter (HOSPITAL_COMMUNITY): Payer: Self-pay

## 2013-02-21 ENCOUNTER — Inpatient Hospital Stay (HOSPITAL_COMMUNITY)
Admission: RE | Admit: 2013-02-21 | Discharge: 2013-02-24 | DRG: 775 | Disposition: A | Discharge status: Home / Self Care | Payer: Medicaid Other | Source: Ambulatory Visit | Attending: Obstetrics & Gynecology | Admitting: Obstetrics & Gynecology

## 2013-02-21 VITALS — BP 125/72 | HR 81 | Temp 98.3°F | Resp 18 | Ht 67.0 in | Wt 200.0 lb

## 2013-02-21 DIAGNOSIS — F1410 Cocaine abuse, uncomplicated: Secondary | ICD-10-CM | POA: Diagnosis present

## 2013-02-21 DIAGNOSIS — F1010 Alcohol abuse, uncomplicated: Secondary | ICD-10-CM | POA: Diagnosis present

## 2013-02-21 DIAGNOSIS — O99344 Other mental disorders complicating childbirth: Secondary | ICD-10-CM | POA: Diagnosis present

## 2013-02-21 DIAGNOSIS — O211 Hyperemesis gravidarum with metabolic disturbance: Secondary | ICD-10-CM

## 2013-02-21 DIAGNOSIS — O480 Post-term pregnancy: Secondary | ICD-10-CM | POA: Diagnosis present

## 2013-02-21 DIAGNOSIS — O99334 Smoking (tobacco) complicating childbirth: Secondary | ICD-10-CM

## 2013-02-21 DIAGNOSIS — O99320 Drug use complicating pregnancy, unspecified trimester: Secondary | ICD-10-CM

## 2013-02-21 DIAGNOSIS — F1210 Cannabis abuse, uncomplicated: Secondary | ICD-10-CM | POA: Diagnosis present

## 2013-02-21 LAB — CBC
HCT: 33.9 % — ABNORMAL LOW (ref 36.0–46.0)
HEMOGLOBIN: 11.6 g/dL — AB (ref 12.0–15.0)
MCH: 32.8 pg (ref 26.0–34.0)
MCHC: 34.2 g/dL (ref 30.0–36.0)
MCV: 95.8 fL (ref 78.0–100.0)
Platelets: 182 10*3/uL (ref 150–400)
RBC: 3.54 MIL/uL — ABNORMAL LOW (ref 3.87–5.11)
RDW: 13.5 % (ref 11.5–15.5)
WBC: 10.7 10*3/uL — AB (ref 4.0–10.5)

## 2013-02-21 MED ORDER — LACTATED RINGERS IV SOLN
INTRAVENOUS | Status: DC
  Administered 2013-02-22 (×2): via INTRAVENOUS

## 2013-02-21 MED ORDER — CITRIC ACID-SODIUM CITRATE 334-500 MG/5ML PO SOLN
30.0000 mL | ORAL | Status: DC | PRN
Start: 2013-02-21 — End: 2013-02-22

## 2013-02-21 MED ORDER — OXYCODONE-ACETAMINOPHEN 5-325 MG PO TABS
1.0000 | ORAL_TABLET | ORAL | Status: DC | PRN
  Administered 2013-02-22: 1 via ORAL
  Filled 2013-02-21 (×2): qty 1

## 2013-02-21 MED ORDER — ONDANSETRON HCL 4 MG/2ML IJ SOLN
4.0000 mg | Freq: Four times a day (QID) | INTRAMUSCULAR | Status: DC | PRN
  Administered 2013-02-22: 4 mg via INTRAVENOUS
  Filled 2013-02-21: qty 2

## 2013-02-21 MED ORDER — OXYTOCIN BOLUS FROM INFUSION
500.0000 mL | INTRAVENOUS | Status: DC
  Administered 2013-02-22: 500 mL via INTRAVENOUS

## 2013-02-21 MED ORDER — LACTATED RINGERS IV SOLN
500.0000 mL | INTRAVENOUS | Status: DC | PRN
Start: 2013-02-21 — End: 2013-02-22
  Administered 2013-02-22 (×2): 300 mL via INTRAVENOUS
  Administered 2013-02-22: 1000 mL via INTRAVENOUS

## 2013-02-21 MED ORDER — LIDOCAINE HCL (PF) 1 % IJ SOLN
30.0000 mL | INTRAMUSCULAR | Status: DC | PRN
  Filled 2013-02-21: qty 30

## 2013-02-21 MED ORDER — OXYTOCIN 40 UNITS IN LACTATED RINGERS INFUSION - SIMPLE MED: 62.5000 mL/h | INTRAVENOUS | Status: DC

## 2013-02-21 MED ORDER — ACETAMINOPHEN 325 MG PO TABS: 650.0000 mg | ORAL_TABLET | ORAL | Status: DC | PRN

## 2013-02-21 MED ORDER — FLEET ENEMA 7-19 GM/118ML RE ENEM: 1.0000 | ENEMA | RECTAL | Status: DC | PRN

## 2013-02-21 MED ORDER — IBUPROFEN 600 MG PO TABS
600.0000 mg | ORAL_TABLET | Freq: Four times a day (QID) | ORAL | Status: DC | PRN
  Administered 2013-02-22: 600 mg via ORAL
  Filled 2013-02-21: qty 1

## 2013-02-22 ENCOUNTER — Encounter (HOSPITAL_COMMUNITY): Payer: Medicaid Other | Admitting: Anesthesiology

## 2013-02-22 ENCOUNTER — Inpatient Hospital Stay (HOSPITAL_COMMUNITY): Payer: Medicaid Other | Admitting: Anesthesiology

## 2013-02-22 ENCOUNTER — Encounter (HOSPITAL_COMMUNITY): Payer: Self-pay

## 2013-02-22 DIAGNOSIS — O99320 Drug use complicating pregnancy, unspecified trimester: Secondary | ICD-10-CM

## 2013-02-22 DIAGNOSIS — O480 Post-term pregnancy: Secondary | ICD-10-CM

## 2013-02-22 DIAGNOSIS — F1010 Alcohol abuse, uncomplicated: Secondary | ICD-10-CM

## 2013-02-22 DIAGNOSIS — F1410 Cocaine abuse, uncomplicated: Secondary | ICD-10-CM | POA: Clinically undetermined

## 2013-02-22 DIAGNOSIS — O99344 Other mental disorders complicating childbirth: Secondary | ICD-10-CM

## 2013-02-22 DIAGNOSIS — O99334 Smoking (tobacco) complicating childbirth: Secondary | ICD-10-CM

## 2013-02-22 LAB — RAPID URINE DRUG SCREEN, HOSP PERFORMED
Amphetamines: NOT DETECTED
Barbiturates: NOT DETECTED
Benzodiazepines: NOT DETECTED
COCAINE: NOT DETECTED
OPIATES: NOT DETECTED
Tetrahydrocannabinol: NOT DETECTED

## 2013-02-22 MED ORDER — TETANUS-DIPHTH-ACELL PERTUSSIS 5-2.5-18.5 LF-MCG/0.5 IM SUSP: 0.5000 mL | Freq: Once | INTRAMUSCULAR | Status: DC

## 2013-02-22 MED ORDER — LANOLIN HYDROUS EX OINT: TOPICAL_OINTMENT | CUTANEOUS | Status: DC | PRN

## 2013-02-22 MED ORDER — PHENYLEPHRINE 40 MCG/ML (10ML) SYRINGE FOR IV PUSH (FOR BLOOD PRESSURE SUPPORT)
80.0000 ug | PREFILLED_SYRINGE | INTRAVENOUS | Status: DC | PRN
  Filled 2013-02-22: qty 2
  Filled 2013-02-22: qty 10

## 2013-02-22 MED ORDER — EPHEDRINE 5 MG/ML INJ
10.0000 mg | INTRAVENOUS | Status: DC | PRN
  Filled 2013-02-22: qty 2
  Filled 2013-02-22 (×2): qty 4

## 2013-02-22 MED ORDER — OXYTOCIN 40 UNITS IN LACTATED RINGERS INFUSION - SIMPLE MED
1.0000 m[IU]/min | INTRAVENOUS | Status: DC
  Administered 2013-02-22: 2 m[IU]/min via INTRAVENOUS
  Filled 2013-02-22: qty 1000

## 2013-02-22 MED ORDER — TERBUTALINE SULFATE 1 MG/ML IJ SOLN: 0.2500 mg | Freq: Once | INTRAMUSCULAR | Status: DC | PRN

## 2013-02-22 MED ORDER — WITCH HAZEL-GLYCERIN EX PADS
1.0000 | MEDICATED_PAD | CUTANEOUS | Status: DC | PRN
Start: 2013-02-22 — End: 2013-02-24

## 2013-02-22 MED ORDER — ONDANSETRON HCL 4 MG PO TABS: 4.0000 mg | ORAL_TABLET | ORAL | Status: DC | PRN

## 2013-02-22 MED ORDER — SIMETHICONE 80 MG PO CHEW
80.0000 mg | CHEWABLE_TABLET | ORAL | Status: DC | PRN
  Filled 2013-02-22: qty 1

## 2013-02-22 MED ORDER — PRENATAL MULTIVITAMIN CH
1.0000 | ORAL_TABLET | Freq: Every day | ORAL | Status: DC
  Administered 2013-02-23 – 2013-02-24 (×2): 1 via ORAL
  Filled 2013-02-22 (×2): qty 1

## 2013-02-22 MED ORDER — MISOPROSTOL 200 MCG PO TABS
ORAL_TABLET | ORAL | Status: AC
  Filled 2013-02-22: qty 5

## 2013-02-22 MED ORDER — SENNOSIDES-DOCUSATE SODIUM 8.6-50 MG PO TABS
2.0000 | ORAL_TABLET | ORAL | Status: DC
  Administered 2013-02-23 – 2013-02-24 (×2): 2 via ORAL
  Filled 2013-02-22 (×2): qty 2

## 2013-02-22 MED ORDER — PHENYLEPHRINE 40 MCG/ML (10ML) SYRINGE FOR IV PUSH (FOR BLOOD PRESSURE SUPPORT)
80.0000 ug | PREFILLED_SYRINGE | INTRAVENOUS | Status: DC | PRN
  Filled 2013-02-22: qty 2

## 2013-02-22 MED ORDER — EPHEDRINE 5 MG/ML INJ
10.0000 mg | INTRAVENOUS | Status: DC | PRN
  Administered 2013-02-22: 10 mg via INTRAVENOUS
  Filled 2013-02-22: qty 2

## 2013-02-22 MED ORDER — ONDANSETRON HCL 4 MG/2ML IJ SOLN: 4.0000 mg | INTRAMUSCULAR | Status: DC | PRN

## 2013-02-22 MED ORDER — DIPHENHYDRAMINE HCL 50 MG/ML IJ SOLN: 12.5000 mg | INTRAMUSCULAR | Status: DC | PRN

## 2013-02-22 MED ORDER — DIBUCAINE 1 % RE OINT: 1.0000 "application " | TOPICAL_OINTMENT | RECTAL | Status: DC | PRN

## 2013-02-22 MED ORDER — OXYCODONE-ACETAMINOPHEN 5-325 MG PO TABS
1.0000 | ORAL_TABLET | ORAL | Status: DC | PRN
  Administered 2013-02-22 – 2013-02-24 (×6): 1 via ORAL
  Filled 2013-02-22 (×5): qty 1

## 2013-02-22 MED ORDER — FENTANYL 2.5 MCG/ML BUPIVACAINE 1/10 % EPIDURAL INFUSION (WH - ANES)
14.0000 mL/h | INTRAMUSCULAR | Status: DC | PRN
  Administered 2013-02-22 (×2): 14 mL/h via EPIDURAL
  Filled 2013-02-22 (×2): qty 125

## 2013-02-22 MED ORDER — IBUPROFEN 600 MG PO TABS
600.0000 mg | ORAL_TABLET | Freq: Four times a day (QID) | ORAL | Status: DC
  Administered 2013-02-22 – 2013-02-24 (×7): 600 mg via ORAL
  Filled 2013-02-22 (×7): qty 1

## 2013-02-22 MED ORDER — LACTATED RINGERS IV SOLN
INTRAVENOUS | Status: DC
  Administered 2013-02-22: 11:00:00 via INTRAUTERINE

## 2013-02-22 MED ORDER — ZOLPIDEM TARTRATE 5 MG PO TABS: 5.0000 mg | ORAL_TABLET | Freq: Every evening | ORAL | Status: DC | PRN

## 2013-02-22 MED ORDER — LACTATED RINGERS IV SOLN
500.0000 mL | Freq: Once | INTRAVENOUS | Status: AC
  Administered 2013-02-22: 500 mL via INTRAVENOUS

## 2013-02-22 MED ORDER — BENZOCAINE-MENTHOL 20-0.5 % EX AERO
1.0000 | INHALATION_SPRAY | CUTANEOUS | Status: DC | PRN
Start: 2013-02-22 — End: 2013-02-24

## 2013-02-22 MED ORDER — SODIUM BICARBONATE 8.4 % IV SOLN
INTRAVENOUS | Status: DC | PRN
  Administered 2013-02-22: 5 mL via EPIDURAL

## 2013-02-22 MED ORDER — FENTANYL CITRATE 0.05 MG/ML IJ SOLN
100.0000 ug | INTRAMUSCULAR | Status: DC | PRN
  Administered 2013-02-22 (×2): 100 ug via INTRAVENOUS
  Filled 2013-02-22 (×2): qty 2

## 2013-02-22 MED ORDER — MISOPROSTOL 200 MCG PO TABS
1000.0000 ug | ORAL_TABLET | Freq: Once | ORAL | Status: AC
  Administered 2013-02-22: 1000 ug via RECTAL

## 2013-02-22 MED ORDER — DIPHENHYDRAMINE HCL 25 MG PO CAPS: 25.0000 mg | ORAL_CAPSULE | Freq: Four times a day (QID) | ORAL | Status: DC | PRN

## 2013-02-22 NOTE — Anesthesia Preprocedure Evaluation (Signed)
Anesthesia Evaluation  Patient identified by MRN, date of birth, ID band Patient awake    Reviewed: Allergy & Precautions, H&P , Patient's Chart, lab work & pertinent test results  Airway Mallampati: II TM Distance: >3 FB Neck ROM: full    Dental  (+) Teeth Intact   Pulmonary Current Smoker,  breath sounds clear to auscultation        Cardiovascular Rhythm:regular Rate:Normal     Neuro/Psych    GI/Hepatic   Endo/Other    Renal/GU      Musculoskeletal   Abdominal   Peds  Hematology   Anesthesia Other Findings  + Cocaine     Reproductive/Obstetrics (+) Pregnancy                           Anesthesia Physical Anesthesia Plan  ASA: II  Anesthesia Plan: Epidural   Post-op Pain Management:    Induction:   Airway Management Planned:   Additional Equipment:   Intra-op Plan:   Post-operative Plan:   Informed Consent: I have reviewed the patients History and Physical, chart, labs and discussed the procedure including the risks, benefits and alternatives for the proposed anesthesia with the patient or authorized representative who has indicated his/her understanding and acceptance.   Dental Advisory Given  Plan Discussed with:   Anesthesia Plan Comments: (Labs checked- platelets confirmed with RN in room. Fetal heart tracing, per RN, reported to be stable enough for sitting procedure. Discussed epidural, and patient consents to the procedure:  included risk of possible headache,backache, failed block, allergic reaction, and nerve injury. This patient was asked if she had any questions or concerns before the procedure started.)        Anesthesia Quick Evaluation

## 2013-02-22 NOTE — H&P (Signed)
Dawn Brock is a 28 y.o. G3P2002 at 41.2 weeks confirmed by [place] presenting for IOL due to postterm dates.   Pregnancy has been complicated by + UDS, TOB use, admitted ETOH use, and hyperemisis w dehydration.  PMH of LEEP procedure.  Patient denies LOF, VB and reports good FM. No recent fevers or illnesses.    No complications with past pregnancies or deliveries. Pelvis proven to 8lb 6oz.  No medications other than PNV. No allergies to medications.  Maternal Medical History:  Reason for admission: Nausea.    OB History   Grav Para Term Preterm Abortions TAB SAB Ect Mult Living   3 2 2  0 0 0 0 0 0 2     Past Medical History  Diagnosis Date  . Abnormal Pap smear   . Urinary tract infection   . Gastritis   . Anemia   . Hx of chlamydia infection   . Vaginal Pap smear, abnormal    Past Surgical History  Procedure Laterality Date  . Leep    . Leep    . Hernia repair      As an infant   Family History: family history includes Asthma in her brother; Birth defects in her son; Cirrhosis in her father; Fibromyalgia in her mother; Hypertension in her mother; Kidney disease in her father. Social History:  reports that she has been smoking Cigarettes.  She has a 3.5 pack-year smoking history. She has never used smokeless tobacco. She reports that she uses illicit drugs (Cocaine and Marijuana). She reports that she does not drink alcohol.- in HD records patient admits to ETOH use.   Prenatal Transfer Tool  Maternal Diabetes: No 1 hr GTT 85 Genetic Screening: Normal Maternal Ultrasounds/Referrals: Normal Fetal Ultrasounds or other Referrals:  None Maternal Substance Abuse:  Yes:  Type: Marijuana, Cocaine, Prescription drugs, Other:  Admitted use of ETOH during pregnancy- beer and liquor Significant Maternal Medications:  None Significant Maternal Lab Results:  Lab values include: Group B Strep negative Other Comments:  None  Review of Systems  Constitutional: Negative for fever  and chills.  Eyes: Negative for blurred vision and double vision.  Respiratory: Negative for cough and shortness of breath.   Cardiovascular: Negative for chest pain.  Gastrointestinal: Negative for nausea, vomiting, abdominal pain and diarrhea.  Genitourinary: Negative for dysuria.  Musculoskeletal: Negative for joint pain and myalgias.  Neurological: Negative for dizziness, tingling and headaches.    Dilation: 3 Effacement (%): 60 Station: -3 Exam by:: L Lamon RN  Blood pressure 125/66, pulse 95, temperature 98 F (36.7 C), temperature source Oral, resp. rate 20, height 5\' 7"  (1.702 m), weight 90.719 kg (200 lb), last menstrual period 05/03/2012.  Exam Physical Exam  Constitutional: She is oriented to person, place, and time. She appears well-developed and well-nourished.  HENT:  Head: Normocephalic and atraumatic.  Eyes: Conjunctivae are normal.  Cardiovascular: Normal rate, regular rhythm, normal heart sounds and intact distal pulses.   Respiratory: Effort normal and breath sounds normal. No respiratory distress.  GI: Soft.  Appropriately gravid uterus. Est. 6.5 lbs by Le[place]olds. Nontender  Genitourinary: Vagina normal and uterus normal.  Musculoskeletal: She exhibits no edema and no tenderness.  Neurological: She is alert and oriented to person, place, and time.  No focal deficits  Skin: Skin is warm and dry. No erythema.  Psychiatric: She has a normal mood and affect. Her behavior is normal. Thought content normal.   Vertex presentation on bedside ultrasound by Dr. Gala Romney  Prenatal labs: ABO, Rh: O/Positive/-- (09/18 0000) Antibody: Negative (09/18 0000) Rubella: Immune (09/18 0000) RPR: Nonreactive (09/18 0000)  HBsAg: Negative (09/18 0000)  HIV: Non-reactive (09/18 0000)  GBS: Negative (01/09 0000)  1 hour GTT 85   Assessment/Plan:  # Post Term pregnancy at 41.2 weeks.  - Admit to L&D with standard induction orders - Start pitocin - Epidural on request  #  THC, Cocaine and ETOH use in Pregnancy - UDS - SW consult  # PostPartum Considerations - Desires Depo shot for contraception - Breast and Bottle feeding    Wilnette Kales 02/22/2013, 2:55 AM  I was present for the exam and agree with above.  [place], [place] 02/22/2013 8:13 AM

## 2013-02-22 NOTE — Progress Notes (Signed)
Dawn Brock is a 28 y.o. G3P2002 at [redacted]w[redacted]d  admitted for induction of labor due to postdates.  Subjective:  Starting to feel pressure. +FM.    Objective: BP 123/67  Pulse 93  Temp(Src) 98.2 F (36.8 C) (Oral)  Resp 18  Ht 5\' 7"  (1.702 m)  Wt 90.719 kg (200 lb)  BMI 31.32 kg/m2  SpO2 100%  LMP 05/03/2012      FHT:  FHR: 155 bpm, variability: moderate,  accelerations:  Present,  decelerations:  Present variable, early UC:   regular, every 2-3 minutes. MVUs 160-180.  SVE:   Dilation: Lip/rim Effacement (%): 90 Station: -2 Exam by:: Olevia Bowens, MD  Labs: Lab Results  Component Value Date   WBC 10.7* 02/21/2013   HGB 11.6* 02/21/2013   HCT 33.9* 02/21/2013   MCV 95.8 02/21/2013   PLT 182 02/21/2013    Assessment / Plan: Induction of labor due to postterm,  progressing well on pitocin  Labor: now with adequate MVUs for last hour and with changing cervix. now with right sided cervix alone. place in exagerated sims on the right side  Fetal Wellbeing:  Category II Pain Control:  Epidural I/D:  n/a Anticipated MOD:  NSVD  Adell Koval L 02/22/2013, 1:41 PM

## 2013-02-22 NOTE — Progress Notes (Signed)
Dawn Brock is a 28 y.o. G3P2002 at [redacted]w[redacted]d.  Subjective: Comfortable w/ epidural.   Objective: BP 121/66  Pulse 85  Temp(Src) 98.2 F (36.8 C) (Oral)  Resp 20  Ht 5\' 7"  (1.702 m)  Wt 90.719 kg (200 lb)  BMI 31.32 kg/m2  SpO2 100%  LMP 05/03/2012 105/49      FHT:  FHR: 140 bpm, variability: moderate,  10x10 accelerations:  Present,  decelerations:  Present repetetive varaibles and 1 prolonged 2.5 minute decel in past 40 minutes. No resolution w/ position changes and bolus.  UC:   Q3 minutes SVE:   Dilation: 6.5 Effacement (%): 80 Station: -1 Exam by:: L Lamon RN SROM since last exam.   Labs: Lab Results  Component Value Date   WBC 10.7* 02/21/2013   HGB 11.6* 02/21/2013   HCT 33.9* 02/21/2013   MCV 95.8 02/21/2013   PLT 182 02/21/2013    Assessment / Plan: Induction of labor due to postterm,  progressing well on pitocin Relative hypotension  Labor: Progressing on Pitocin. Preeclampsia:  NA Fetal Wellbeing:  Category II Pain Control:  Epidural I/D:  n/a Anticipated MOD:  NSVD Decrease pitocin to 8 milliUnits/min Treat hypotension  Dawn Brock 02/22/2013, 7:33 AM

## 2013-02-22 NOTE — Anesthesia Procedure Notes (Signed)

## 2013-02-22 NOTE — Progress Notes (Signed)
Dawn Brock is a 28 y.o. G3P2002 at [redacted]w[redacted]d.  Subjective: Very uncomfortable. Requesting pain meds.   Objective: BP 113/50  Pulse 74  Temp(Src) 98.2 F (36.8 C) (Oral)  Resp 20  Ht 5\' 7"  (1.702 m)  Wt 90.719 kg (200 lb)  BMI 31.32 kg/m2  SpO2 100%  LMP 05/03/2012      FHT:  FHR: 150 bpm, variability: moderate,  accelerations:  Present,  decelerations:  Present few variables UC:   regular, every 1-4 minutes, moderate SVE:   Dilation: 4.5 Effacement (%): 70 Station: -3 Exam by:: V Desa Rech CNM  Labs: Lab Results  Component Value Date   WBC 10.7* 02/21/2013   HGB 11.6* 02/21/2013   HCT 33.9* 02/21/2013   MCV 95.8 02/21/2013   PLT 182 02/21/2013    Assessment / Plan: Induction of labor due to postterm,  progressing well on pitocin  Labor: Progressing normally Preeclampsia:  NA Fetal Wellbeing:  Category II Pain Control:  may have fentanyl or epidural I/D:  n/a Anticipated MOD:  NSVD  Dawn Brock 02/22/2013, 6:24 AM

## 2013-02-22 NOTE — Progress Notes (Signed)
Dawn Brock is a 28 y.o. G3P2002 at [redacted]w[redacted]d admitted for induction of labor due to Post dates. Due date 2/2.  Subjective: Feeling well, no pain since epidural. Somewhat anxious about baby heart rate.  Objective: BP 129/59  Pulse 143  Temp(Src) 98.2 F (36.8 C) (Oral)  Resp 20  Ht 5\' 7"  (1.702 m)  Wt 90.719 kg (200 lb)  BMI 31.32 kg/m2  SpO2 100%  LMP 05/03/2012     FHT:  FHR: 160 bpm, variability: moderate,  accelerations:  Abscent,  decelerations:  Present repetitive variables, some deep, all with quick return to baseline UC:   regular, every 2-3 minutes SVE:   Dilation: 8.5 Effacement (%): 80 (thick anterior lip on right side) Station: -2 Exam by:: Harraway-Smith, MD  Labs: Lab Results  Component Value Date   WBC 10.7* 02/21/2013   HGB 11.6* 02/21/2013   HCT 33.9* 02/21/2013   MCV 95.8 02/21/2013   PLT 182 02/21/2013    Assessment / Plan: Induction of labor due to postterm,  progressing well on pitocin  Labor: Progressing normally, IUPC and FSE placed, amnioinfusion for repetitive variables Preeclampsia:  no signs or symptoms of toxicity Fetal Wellbeing:  Category II Pain Control:  Epidural I/D:  n/a Anticipated MOD:  NSVD  Beverlyn Roux 02/22/2013, 10:45 AM

## 2013-02-22 NOTE — Progress Notes (Signed)
Marlou Porch, CNM, notified of repetitive variables, interventions (bolus, position changes), blood pressures, and that pitocin is on 12 milliunits. Orders received to decrease pitocin to 8 milliunits, give ephedrine, and to place a foley catheter. Provider also reviewed strip.

## 2013-02-23 LAB — CBC
HEMATOCRIT: 29.3 % — AB (ref 36.0–46.0)
Hemoglobin: 9.8 g/dL — ABNORMAL LOW (ref 12.0–15.0)
MCH: 32.5 pg (ref 26.0–34.0)
MCHC: 33.4 g/dL (ref 30.0–36.0)
MCV: 97 fL (ref 78.0–100.0)
PLATELETS: 157 10*3/uL (ref 150–400)
RBC: 3.02 MIL/uL — ABNORMAL LOW (ref 3.87–5.11)
RDW: 13.8 % (ref 11.5–15.5)
WBC: 14.2 10*3/uL — AB (ref 4.0–10.5)

## 2013-02-23 MED ORDER — SIMETHICONE 80 MG PO CHEW
80.0000 mg | CHEWABLE_TABLET | ORAL | Status: DC | PRN
  Administered 2013-02-23: 80 mg via ORAL

## 2013-02-23 MED ORDER — POLYETHYLENE GLYCOL 3350 17 G PO PACK
17.0000 g | PACK | Freq: Two times a day (BID) | ORAL | Status: DC
  Administered 2013-02-23 (×2): 17 g via ORAL
  Filled 2013-02-23 (×5): qty 1
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
## 2013-02-23 NOTE — Progress Notes (Signed)
CM / UR chart review completed.  

## 2013-02-23 NOTE — Progress Notes (Signed)
Post Partum Day #1 Subjective: no complaints, up ad lib, voiding and tolerating PO  Ms. Vallandingham is a 28 y.o. [id number] who presented for IOL for postterm ([redacted]w[redacted]d). She c/o of abdominal pain rated as a 9/10. She is voiding well and ambulating. She is breast feeding and is planning for an OP circ.   Objective: Blood pressure 115/74, pulse 76, temperature 97.9 F (36.6 C), temperature source Oral, resp. rate 18, height 5\' 7"  (1.702 m), weight 90.719 kg (200 lb), last menstrual period 05/03/2012, SpO2 97.00%, unknown if currently breastfeeding.  Physical Exam:  General: alert, cooperative and no distress Lochia: appropriate Uterine Fundus: firm Incision: N/A DVT Evaluation: No evidence of DVT seen on physical exam. No significant calf/ankle edema.   Recent Labs  02/21/13 2250 02/23/13 0610  HGB 11.6* 9.8*  HCT 33.9* 29.3*    Assessment/Plan: Plan for discharge tomorrow, Breastfeeding and Contraception Depo Provera  Ms. Yearsley is a 28 y.o. [phone] who presented for IOL for postterm ([redacted]w[redacted]d). She delivered a female by SVD at approximately 4pm on 02/22/13. She would like to consider discharge this afternoon, but is concerned about transportation. She is breast feeding and pumping, and plans for depo provera as MOC. She will schedule an OP circumcision.   LOS: 2 days   Minette Brine 02/23/2013, 7:35 AM   I have seen and examined this patient and I agree with the above. Pt easily talking on phone; will monitor abd pain but doubt it is significant of a med concern at this time. Serita Grammes 9:20 AM 02/23/2013

## 2013-02-23 NOTE — Lactation Note (Signed)
This note was copied from the chart of [place]. Lactation Consultation Note  Patient Name: Dawn Brock PHXTA'V Date: 02/23/2013 Reason for consult: Initial assessment;Other (Comment) (charting for exclusion). This is mom's third child but she only nursed her 30 yo son a few weeks, and did not nurse oldest child. This baby has been cluster feeding this evening and is 29 hours of age.  Mom has a few drops of expressible colostrum and LC reviewed colostrum stage and supply and demand for milk production.  LC reviewed LEAD cautions regarding early formula supplementation when not medically indicated and encouraged STS and cue feedings.  LC reviewed rapid digestion of mother's milk as reason for cluster feedings.  LC encouraged review of Baby and Me pp 9, 14 and 20-25 for STS and BF information. LC provided [employer] Resource brochure and reviewed [facility] services and list of community and web site resources.     Maternal Data Formula Feeding for Exclusion: Yes Reason for exclusion: Mother's choice to formula and breast feed on admission ([place] reviewed LEAD cautions regarding early supplementation w/formula) Infant to breast within first hour of birth: Yes (beastfed for 15 minutes with LATCH score=7) Has patient been taught Hand Expression?: Yes Does the patient have breastfeeding experience prior to this delivery?: Yes  Feeding    LATCH Score/Interventions      LATCH score=8 at this feeding (baby already latched then re-latched after 5 minutes (see LC LATCH score at 2110)                Lactation Tools Discussed/Used   STS, cue feedings, hand expression and nipple care with expressed colostrum/milk LEAD cautions for supplementation   Consult Status Consult Status: Follow-up Date: 02/24/13 Follow-up type: In-patient    Junious Dresser [facility] 02/23/2013, 9:23 PM

## 2013-02-23 NOTE — Anesthesia Postprocedure Evaluation (Signed)
  Anesthesia Post-op Note  Anesthesia Post Note  Patient: Dawn Brock  Procedure(s) Performed: * No procedures listed *  Anesthesia type: Epidural  Patient location: Mother/Baby  Post pain: Pain level controlled  Post assessment: Post-op Vital signs reviewed  Last Vitals:  Filed Vitals:   02/23/13 0538  BP: 115/74  Pulse: 76  Temp: 36.6 C  Resp: 18    Post vital signs: Reviewed  Level of consciousness:alert  Complications: No apparent anesthesia complications

## 2013-02-24 MED ORDER — IBUPROFEN 600 MG PO TABS: 600.0000 mg | ORAL_TABLET | Freq: Four times a day (QID) | ORAL | Status: DC

## 2013-02-24 NOTE — Discharge Summary (Signed)
Obstetric Discharge Summary Reason for Admission: induction of labor Prenatal Procedures: none Intrapartum Procedures: spontaneous vaginal delivery Postpartum Procedures: none Complications-Operative and Postpartum: none Hemoglobin  Date Value Ref Range Status  02/23/2013 9.8* 12.0 - 15.0 g/dL Final     HCT  Date Value Ref Range Status  02/23/2013 29.3* 36.0 - 46.0 % Final    Physical Exam:  General: alert, cooperative, appears stated age and no distress Lochia: appropriate Uterine Fundus: firm DVT Evaluation: No evidence of DVT seen on physical exam. Negative Homan's sign. No cords or calf tenderness. No significant calf/ankle edema.  Discharge Diagnoses: Term Pregnancy-delivered  Discharge Information: Date: 02/24/2013 Activity: pelvic rest Diet: routine Medications: PNV, Ibuprofen and Miralax Condition: stable Instructions: refer to practice specific booklet Discharge to: home   Hospital Course Ms. Kissel presented for induction of labor due to post dates. She delivered a healthy baby boy via SVD and she has had no complications this hospital course.  She desires Depo for contraception and is breast feeding. She is discharged in stable condition.   Newborn Data: Live born female  Birth Weight: 8 lb 2 oz (3685 g) APGAR: 5, 7  Home with mother.  Wilnette Kales 02/24/2013, 9:44 AM  I have seen and examined this patient and agree with above documentation in the resident's note. Social work was consulted due to her hx of cocaine and THC use, they felt she was stable for discharge with the baby. No concerns to discharge.   Ebbie Latus, M.D. [facility] Fellow 02/24/2013 11:34 AM

## 2013-02-24 NOTE — Discharge Instructions (Signed)

## 2013-02-24 NOTE — Lactation Note (Signed)
This note was copied from the chart of [place]. Lactation Consultation Note  Patient Name: Dawn Brock DJTTS'V Date: 02/24/2013 Reason for consult: Follow-up assessment Per mom nipples tender , left sorer than right. Mom requesting to latch the baby with assist . LC reviewed basics , breast massage , hand express, latched with depth in the football position.  Multiply swallows noted , per mom comfortable except for the cramping.  Reviewed hand pump , increased flange due to sore ness #27 , has comfort gels , reference baby and me pgs 20 2-5 for breast feeding. Mom aware of the [facility] and the [facility] O/P services.    Maternal Data Has patient been taught Hand Expression?: Yes  Feeding Feeding Type: Breast Fed  LATCH Score/Interventions Latch: Grasps breast easily, tongue down, lips flanged, rhythmical sucking. Intervention(s): Adjust position;Assist with latch;Breast massage;Breast compression  Audible Swallowing: Spontaneous and intermittent  Type of Nipple: Everted at rest and after stimulation  Comfort (Breast/Nipple): Filling, red/small blisters or bruises, mild/mod discomfort  Problem noted: Filling  Hold (Positioning): Assistance needed to correctly position infant at breast and maintain latch. Intervention(s): Breastfeeding basics reviewed;Support Pillows;Position options;Skin to skin  LATCH Score: 8  Lactation Tools Discussed/Used Tools: Pump;Comfort gels Breast pump type: Manual WIC Program: Yes (per mom ) Pump Review: Setup, frequency, and cleaning;Milk Storage Initiated by:: MAI  (reviewed , MBU RN had given the hand pump ) Date initiated:: 02/24/13   Consult Status Consult Status: Complete    Myer Haff 02/24/2013, 10:49 AM

## 2013-02-24 NOTE — Discharge Summary (Signed)
Attestation of Attending Supervision of Obstetric Fellow: Evaluation and management procedures were performed by the Obstetric Fellow under my supervision and collaboration.  I have reviewed the Obstetric Fellow's note and chart, and I agree with the management and plan.  Kelsy Polack, MD, FACOG Attending Obstetrician & Gynecologist Faculty Practice, [facility]    

## 2013-02-25 MED ORDER — IBUPROFEN 600 MG PO TABS: 600.0000 mg | ORAL_TABLET | Freq: Four times a day (QID) | ORAL | Status: DC | PRN

## 2013-02-27 LAB — RPR: RPR: NONREACTIVE

## 2013-02-28 NOTE — H&P (Signed)
Attestation of Attending Supervision of Advanced Practitioner (CNM/NP): Evaluation and management procedures were performed by the Advanced Practitioner under my supervision and collaboration. I have reviewed the Advanced Practitioner's note and chart, and I agree with the management and plan.  LEGGETT,KELLY H. 4:13 PM

## 2013-04-09 ENCOUNTER — Emergency Department (HOSPITAL_COMMUNITY)
Admission: EM | Admit: 2013-04-09 | Discharge: 2013-04-09 | Disposition: A | Discharge status: Hospice | Payer: Medicaid Other | Source: Home / Self Care | Attending: Family Medicine | Admitting: Family Medicine

## 2013-04-09 ENCOUNTER — Emergency Department (HOSPITAL_COMMUNITY): Payer: Medicaid Other

## 2013-04-09 ENCOUNTER — Encounter (HOSPITAL_COMMUNITY): Payer: Self-pay | Admitting: Emergency Medicine

## 2013-04-09 ENCOUNTER — Emergency Department (HOSPITAL_COMMUNITY)
Admission: EM | Admit: 2013-04-09 | Discharge: 2013-04-10 | Disposition: A | Discharge status: Home / Self Care | Payer: Medicaid Other | Attending: Emergency Medicine | Admitting: Emergency Medicine

## 2013-04-09 DIAGNOSIS — IMO0002 Reserved for concepts with insufficient information to code with codable children: Secondary | ICD-10-CM

## 2013-04-09 DIAGNOSIS — S0003XA Contusion of scalp, initial encounter: Secondary | ICD-10-CM | POA: Insufficient documentation

## 2013-04-09 DIAGNOSIS — Z862 Personal history of diseases of the blood and blood-forming organs and certain disorders involving the immune mechanism: Secondary | ICD-10-CM | POA: Insufficient documentation

## 2013-04-09 DIAGNOSIS — Z8619 Personal history of other infectious and parasitic diseases: Secondary | ICD-10-CM | POA: Insufficient documentation

## 2013-04-09 DIAGNOSIS — S0993XA Unspecified injury of face, initial encounter: Secondary | ICD-10-CM

## 2013-04-09 DIAGNOSIS — S0510XA Contusion of eyeball and orbital tissues, unspecified eye, initial encounter: Secondary | ICD-10-CM | POA: Insufficient documentation

## 2013-04-09 DIAGNOSIS — S199XXA Unspecified injury of neck, initial encounter: Secondary | ICD-10-CM

## 2013-04-09 DIAGNOSIS — F17200 Nicotine dependence, unspecified, uncomplicated: Secondary | ICD-10-CM | POA: Insufficient documentation

## 2013-04-09 DIAGNOSIS — Z87440 Personal history of urinary (tract) infections: Secondary | ICD-10-CM | POA: Insufficient documentation

## 2013-04-09 DIAGNOSIS — Z23 Encounter for immunization: Secondary | ICD-10-CM

## 2013-04-09 DIAGNOSIS — T7411XA Adult physical abuse, confirmed, initial encounter: Secondary | ICD-10-CM

## 2013-04-09 DIAGNOSIS — S0083XA Contusion of other part of head, initial encounter: Secondary | ICD-10-CM

## 2013-04-09 DIAGNOSIS — S1093XA Contusion of unspecified part of neck, initial encounter: Principal | ICD-10-CM

## 2013-04-09 MED ORDER — ACETAMINOPHEN 500 MG PO TABS: 1000.0000 mg | ORAL_TABLET | Freq: Once | ORAL | Status: DC

## 2013-04-09 MED ORDER — TRAMADOL HCL 50 MG PO TABS: 50.0000 mg | ORAL_TABLET | Freq: Four times a day (QID) | ORAL | Status: DC | PRN

## 2013-04-09 MED ORDER — IBUPROFEN 800 MG PO TABS
800.0000 mg | ORAL_TABLET | Freq: Once | ORAL | Status: AC
  Administered 2013-04-09: 800 mg via ORAL

## 2013-04-09 MED ORDER — HYDROCODONE-ACETAMINOPHEN 5-325 MG PO TABS
2.0000 | ORAL_TABLET | Freq: Once | ORAL | Status: AC
  Administered 2013-04-09: 2 via ORAL
  Filled 2013-04-09: qty 2

## 2013-04-09 MED ORDER — IBUPROFEN 800 MG PO TABS
ORAL_TABLET | ORAL | Status: AC
  Filled 2013-04-09: qty 1

## 2013-04-09 MED ORDER — TETANUS-DIPHTH-ACELL PERTUSSIS 5-2.5-18.5 LF-MCG/0.5 IM SUSP
0.5000 mL | Freq: Once | INTRAMUSCULAR | Status: AC
  Administered 2013-04-09: 0.5 mL via INTRAMUSCULAR

## 2013-04-09 MED ORDER — TETANUS-DIPHTH-ACELL PERTUSSIS 5-2.5-18.5 LF-MCG/0.5 IM SUSP
INTRAMUSCULAR | Status: AC
  Filled 2013-04-09: qty 0.5

## 2013-04-09 NOTE — Discharge Instructions (Signed)
Take Tramadol as needed for pain. Refer to attached documents for more information. Refer to the resource guide below for resources. Return to the ED with worsening or concerning symptoms, even if you feel unsafe.    Emergency Department Resource Guide 1) Find a Doctor and Pay Out of Pocket Although you won't have to find out who is covered by your insurance plan, it is a good idea to ask around and get recommendations. You will then need to call the office and see if the doctor you have chosen will accept you as a new patient and what types of options they offer for patients who are self-pay. Some doctors offer discounts or will set up payment plans for their patients who do not have insurance, but you will need to ask so you aren't surprised when you get to your appointment.  2) Contact Your Local Health Department Not all health departments have doctors that can see patients for sick visits, but many do, so it is worth a call to see if yours does. If you don't know where your local health department is, you can check in your phone book. The CDC also has a tool to help you locate your state's health department, and many state websites also have listings of all of their local health departments.  3) Find a [facility] If your illness is not likely to be very severe or complicated, you may want to try a walk in clinic. These are popping up all over the country in pharmacies, drugstores, and shopping centers. They're usually staffed by nurse practitioners or physician assistants that have been trained to treat common illnesses and complaints. They're usually fairly quick and inexpensive. However, if you have serious medical issues or chronic medical problems, these are probably not your best option.  No Primary Care Doctor: - Call Health Connect at  [phone] - they can help you locate a primary care doctor that  accepts your insurance, provides certain services, etc. - Physician Referral Service-  [phone]  Chronic Pain Problems: Organization         Address  Phone   Notes  [facility]  [phone] Patients need to be referred by their primary care doctor.   Medication Assistance: Organization         Address  Phone   Notes  [facility] Medication [facility] [place], [place] [address] [phone] --Must be a resident of [facility] -- Must have NO insurance coverage whatsoever (no Medicaid/ Medicare, etc.) -- The pt. MUST have a primary care doctor that directs their care regularly and follows them in the community   MedAssist  [phone]   [employer]  [phone]    Agencies that provide inexpensive medical care: Organization         Address  Phone   Notes  [place]  [phone]   [facility]    [phone]   [facility] [place], [place] [address] [phone]   [place] [address], [place] [phone]   Planned Parenthood    [phone]   [facility]    [phone]   [place] and [place] [address], [place] Phone:  [phone], Fax:  [phone] Hours of Operation:  9 am - 6 pm, M-F.  Also accepts Medicaid/Medicare and self-pay.  [facility] for [place], [address], [place] Phone: [phone], Fax: [phone]. Hours of Operation:  8:30 am - 5:30 pm, M-F.  Also accepts Medicaid and self-pay.  [facility] [place] [address] [place] Phone: [phone]   [place], [place], [place] [phone], Ext. [phone] Mondays & Thursdays: 7-9 AM.  First 15 patients are seen on a first come, first serve basis.    [place] Providers:  Organization         Address  Phone   Notes  [facility] [address],  [place] [phone] Also accepts self-pay patients.  [facility] [id number] [place], [place]  [phone]   [place], [address], [place] [phone]   [facility] [address] [place] [phone]   Lucianne Lei [address], [place]   [phone] Only accepts [place] Access [place] patients after they have their name applied to their card.   Self-Pay (no insurance) in [facility]:  Organization         Address  Phone   Notes  Sickle Cell Patients, [facility] [place] [phone]   [facility] Urgent Care [place] [phone]   [facility] [place]  [place], [place], [place] [phone]   [facility]/Dr. Osei-Bonsu  [address], [place] or [address], [place] [phone] Phone number for both [place] and [place] locations is the same.  [facility] [address] [place] [phone]   [facility] [address], [place] or [address] [phone]   [facility] [address], [place] [phone], phone; [phone], fax Sees patients 1st and 3rd Saturday of every month.  Must not qualify for public or private insurance (i.e. Medicaid, Medicare, [place] Health Choice, Veterans' Benefits)  Household income should be no more than 200% of the poverty level The clinic cannot treat you if you are pregnant or think you are pregnant  Sexually transmitted diseases are not treated at the clinic.    Dental Care: Organization         Address  Phone  Notes  [facility] Department of [facility] [place] [phone] Accepts children up to age 102 who are enrolled in  [place] or [place]; pregnant women with a Medicaid card; and children who have applied for Medicaid or [place] Health Choice, but were declined, whose parents can pay a reduced fee at time of service.  [facility] Department of [facility]  [address], [place] [phone] Accepts children up to age 74 who are enrolled in [place] or [place]; pregnant women with a Medicaid card; and children who have applied for Medicaid or [place] Health Choice, but were declined, whose parents can pay a reduced fee at time of service.  [facility] Adult [facility] Access PROGRAM  [place] [phone] Patients are seen by appointment only. Walk-ins are not accepted. [place] will see patients 40 years of age and older. Monday - Tuesday (8am-5pm) Most Wednesdays (8:30-5pm) $30 per visit, cash  only  [facility] Adult [facility] Access PROGRAM  [address], [facility] [phone] Patients are seen by appointment only. Walk-ins are not accepted. [place] will see patients 14 years of age and older. One Wednesday Evening (Monthly: Volunteer Based).  $30 per visit, cash only  [place]  [phone] for adults; Children under age 48, call Graduate Pediatric Dentistry at [phone]. Children aged 39-14, please call [phone] to request a pediatric application.  Dental services are provided in all areas of dental care including fillings, crowns and bridges, complete and partial dentures, implants, gum treatment, root canals, and extractions. Preventive care is also provided. Treatment is provided to both adults and children. Patients are selected via a lottery and there is often a waiting list.   [facility] [address] [place]  [phone] [email or web address]   [facility] [address] [place], [place] [phone], Ext. [phone] Second and Fourth Thursday of each month, opens at 6:30  AM; Clinic ends at 9 AM.  Patients are seen on a first-come first-served basis, and a limited number are seen during each clinic.   [facility]  [address] Hillard Danker [place], [place] [phone]   Eligibility Requirements You must have lived in [place], [place], or [place] counties for at least the last three months.   You cannot be eligible for state or federal sponsored [employer], including [employer], [place], or [employer].   You generally cannot be eligible for healthcare insurance through your employer.    How to apply: Eligibility screenings are held every Tuesday and Wednesday afternoon from 1:00 pm until 4:00 pm. You do not need an appointment for the interview!  [facility] [address], [place], [place]   [place]  [place] Department  [place]  [phone]    Behavioral Health Resources in the Community: Intensive Outpatient Programs Organization         Address  Phone  Notes  [place]. [address], [place], [place] [phone]   [facility] Outpatient [address] [place], [place]   ADS: Alcohol & Drug Svcs [address], [place], [place]   [place] [address],  [place], [place] or [phone]   Substance Abuse Resources Organization         Address  Phone  Notes  Alcohol and Drug Services  [phone]   [place]  [phone]   The [place]   Chinita Pester  [phone]   Residential & Outpatient Substance Abuse Program  [phone]   Psychological Services Organization         Address  Phone  Notes  [facility] [place]  [place]  [phone]   [place] [address], [place] or  [phone]    Mobile Crisis Teams Organization         Address  Phone  Notes  [facility], Mobile Crisis Care Unit  [phone]   [facility]  [address] [place], [place]   [place] Levels [address] [place] [phone]    Self-Help/Support Groups Organization         Address  Phone  Notes  [facility]ental [facility]oc[facility] of [place] - variety of support groups  [place] Call for more information  Narcotics Anonymous (NA), [facility] [address], [employer] [place]  2 meetings at this location   Special educational needs teacher         Address  Phone  Notes  ASAP Residential Treatment [place],    [place]  [phone]   [facility]  [address] [place] [id number], [place], [place]   [place], [place] [phone] Admissions: 8am-3pm M-F  Incentives Substance [place] [address]    [place], [place] [phone]   [facility] [address] [place], [place], [place]   [facility] [address],  [place], [place]   [facility] Programs - Intensive Outpatient [address], [place], [place]   [facility] ([place] [address]  [place], [place] [phone] or [phone]   [facility]idential Treatment Services (RTS) [address] [place], [place] Accepts Medicaid  [facility] [place] [address],  [place] [phone] Substance Abuse/Addiction Treatment   [facility] The Organization         Address  Phone  Notes  [facility]  [phone]   Domenic Schwab, PhD [address] Arlis Porta [place], [place]   [phone] or [phone]   [place] and [place], [place] [phone]   [facility] [address],  [place], [place] [phone] Insurance/Medicaid/sponsorship through [facility] and Families [address], [address] [place]                                    [place], [place] [phone] [place] [address], [place] [phone]    Dr. Adele Schilder  [phone]   [facility] Dept. 1) [address], [place] 2) [place] 3)  [place] [address], [place] [phone]  [phone]   [place] [phone] or [phone] (After Hours)
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
## 2013-04-10 NOTE — ED Notes (Signed)
Pt reports she feels safe to go home. Security called to bring pt to urgent care where her vehicle is.

## 2013-04-12 NOTE — ED Provider Notes (Signed)
Medical screening examination/treatment/procedure(s) were performed by a resident physician or non-physician practitioner and as the supervising physician I was immediately available for consultation/collaboration.  Lynne Leader, MD    Gregor Hams, MD 04/12/13 [phone]

## 2013-04-16 NOTE — ED Provider Notes (Signed)
Medical screening examination/treatment/procedure(s) were performed by non-physician practitioner and as supervising physician I was immediately available for consultation/collaboration.   Olando Willems L Kyston Gonce, MD 04/16/13 1355 

## 2013-07-31 ENCOUNTER — Encounter (HOSPITAL_COMMUNITY): Payer: Self-pay | Admitting: Emergency Medicine

## 2013-07-31 ENCOUNTER — Emergency Department (HOSPITAL_COMMUNITY): Payer: Medicaid Other

## 2013-07-31 ENCOUNTER — Emergency Department (HOSPITAL_COMMUNITY)
Admission: EM | Admit: 2013-07-31 | Discharge: 2013-07-31 | Disposition: A | Discharge status: Home / Self Care | Payer: Self-pay | Attending: Emergency Medicine | Admitting: Emergency Medicine

## 2013-07-31 ENCOUNTER — Emergency Department (HOSPITAL_COMMUNITY): Payer: Self-pay

## 2013-07-31 DIAGNOSIS — R079 Chest pain, unspecified: Secondary | ICD-10-CM | POA: Insufficient documentation

## 2013-07-31 DIAGNOSIS — R0602 Shortness of breath: Secondary | ICD-10-CM | POA: Insufficient documentation

## 2013-07-31 DIAGNOSIS — Z79899 Other long term (current) drug therapy: Secondary | ICD-10-CM | POA: Insufficient documentation

## 2013-07-31 DIAGNOSIS — F17200 Nicotine dependence, unspecified, uncomplicated: Secondary | ICD-10-CM | POA: Insufficient documentation

## 2013-07-31 DIAGNOSIS — Z862 Personal history of diseases of the blood and blood-forming organs and certain disorders involving the immune mechanism: Secondary | ICD-10-CM | POA: Insufficient documentation

## 2013-07-31 DIAGNOSIS — Z87440 Personal history of urinary (tract) infections: Secondary | ICD-10-CM | POA: Insufficient documentation

## 2013-07-31 DIAGNOSIS — Z8619 Personal history of other infectious and parasitic diseases: Secondary | ICD-10-CM | POA: Insufficient documentation

## 2013-07-31 DIAGNOSIS — Z3202 Encounter for pregnancy test, result negative: Secondary | ICD-10-CM | POA: Insufficient documentation

## 2013-07-31 LAB — CBC
HCT: 41.7 % (ref 36.0–46.0)
HEMOGLOBIN: 13.3 g/dL (ref 12.0–15.0)
MCH: 29 pg (ref 26.0–34.0)
MCHC: 31.9 g/dL (ref 30.0–36.0)
MCV: 91 fL (ref 78.0–100.0)
Platelets: 159 10*3/uL (ref 150–400)
RBC: 4.58 MIL/uL (ref 3.87–5.11)
RDW: 15.2 % (ref 11.5–15.5)
WBC: 5.6 10*3/uL (ref 4.0–10.5)

## 2013-07-31 LAB — POC URINE PREG, ED: PREG TEST UR: NEGATIVE

## 2013-07-31 LAB — BASIC METABOLIC PANEL
Anion gap: 13 (ref 5–15)
BUN: 11 mg/dL (ref 6–23)
CALCIUM: 9.5 mg/dL (ref 8.4–10.5)
CO2: 24 mEq/L (ref 19–32)
Chloride: 104 mEq/L (ref 96–112)
Creatinine, Ser: 0.86 mg/dL (ref 0.50–1.10)
GLUCOSE: 68 mg/dL — AB (ref 70–99)
Potassium: 4.2 mEq/L (ref 3.7–5.3)
SODIUM: 141 meq/L (ref 137–147)

## 2013-07-31 LAB — I-STAT TROPONIN, ED: Troponin i, poc: 0.02 ng/mL (ref 0.00–0.08)

## 2013-07-31 LAB — D-DIMER, QUANTITATIVE: D-Dimer, Quant: 1.7 ug/mL-FEU — ABNORMAL HIGH (ref 0.00–0.48)

## 2013-07-31 MED ORDER — MORPHINE SULFATE 4 MG/ML IJ SOLN
4.0000 mg | Freq: Once | INTRAMUSCULAR | Status: AC
  Administered 2013-07-31: 4 mg via INTRAVENOUS
  Filled 2013-07-31: qty 1

## 2013-07-31 MED ORDER — IOHEXOL 350 MG/ML SOLN
100.0000 mL | Freq: Once | INTRAVENOUS | Status: AC | PRN
  Administered 2013-07-31: 100 mL via INTRAVENOUS

## 2013-07-31 MED ORDER — KETOROLAC TROMETHAMINE 15 MG/ML IJ SOLN
15.0000 mg | Freq: Once | INTRAMUSCULAR | Status: AC
  Administered 2013-07-31: 15 mg via INTRAVENOUS
  Filled 2013-07-31: qty 1

## 2013-07-31 MED ORDER — KETOROLAC TROMETHAMINE 10 MG PO TABS: 10.0000 mg | ORAL_TABLET | Freq: Four times a day (QID) | ORAL | Status: DC | PRN

## 2013-07-31 MED ORDER — ASPIRIN 325 MG PO TABS
325.0000 mg | ORAL_TABLET | ORAL | Status: AC
  Administered 2013-07-31: 325 mg via ORAL
  Filled 2013-07-31: qty 1

## 2013-07-31 MED ORDER — LORAZEPAM 2 MG/ML IJ SOLN
1.0000 mg | Freq: Once | INTRAMUSCULAR | Status: AC
  Administered 2013-07-31: 1 mg via INTRAVENOUS
  Filled 2013-07-31: qty 1

## 2013-07-31 MED ORDER — HYDROCODONE-ACETAMINOPHEN 5-325 MG PO TABS
2.0000 | ORAL_TABLET | ORAL | Status: DC | PRN
Start: 2013-07-31 — End: 2014-03-16

## 2013-07-31 MED ORDER — MORPHINE SULFATE 4 MG/ML IJ SOLN
4.0000 mg | Freq: Once | INTRAMUSCULAR | Status: AC
Start: 2013-07-31 — End: 2013-07-31
  Administered 2013-07-31: 4 mg via INTRAVENOUS
  Filled 2013-07-31: qty 1

## 2013-07-31 NOTE — ED Notes (Signed)
Pt aware that urine sample is needed. Instructed pt to call when she felt like she could go.

## 2013-07-31 NOTE — ED Provider Notes (Signed)
CSN: [id number]     Arrival date & time 07/31/13  1153 History   First MD Initiated Contact with Patient 07/31/13 1203     Chief Complaint  Patient presents with  . Chest Pain     (Consider location/radiation/quality/duration/timing/severity/associated sxs/prior Treatment) HPI Comments: Patient is a 28 year old female past medical history significant for tobacco abuse presenting to the emergency department for 2 days of right-sided chest pain with shortness of breath. Patient states her pain is worsened with palpation, deep inspiration, movement. Denies any alleviating factors. Attempted to take Motrin without improvement. Patient states she recently gave birth 5 months ago and since has been on the Depo shot for birth control method. Denies any history of PE or DVT, recent surgeries, recent mobilizations. No early familial cardiac history. Patient states she feels very stressed and she was told by plasma donating site she may have hepatitis, is awaiting her hepatitis panel from the health department.  Patient is a 28 y.o. female presenting with chest pain.  Chest Pain Associated symptoms: shortness of breath     Past Medical History  Diagnosis Date  . Abnormal Pap smear   . Urinary tract infection   . Gastritis   . Anemia   . Hx of chlamydia infection   . Vaginal Pap smear, abnormal    Past Surgical History  Procedure Laterality Date  . Leep    . Leep    . Hernia repair      As an infant   Family History  Problem Relation Age of Onset  . Hypertension Mother   . Fibromyalgia Mother   . Cirrhosis Father   . Kidney disease Father   . Asthma Brother   . Birth defects Son     hirschprungs   History  Substance Use Topics  . Smoking status: Current Every Day Smoker -- 0.25 packs/day for 14 years    Types: Cigarettes  . Smokeless tobacco: Never Used  . Alcohol Use: 0.0 oz/week     Comment: Not since June 2014   OB History   Grav Para Term Preterm Abortions TAB SAB Ect  Mult Living   3 3 3  0 0 0 0 0 0 3     Review of Systems  Respiratory: Positive for shortness of breath.   Cardiovascular: Positive for chest pain.  Psychiatric/Behavioral: The patient is nervous/anxious.   All other systems reviewed and are negative.     Allergies  Review of patient's allergies indicates no known allergies.  Home Medications   Prior to Admission medications   Medication Sig Start Date End Date Taking? Authorizing Provider  ibuprofen (ADVIL,MOTRIN) 600 MG tablet Take 600 mg by mouth every 6 (six) hours as needed for moderate pain.   Yes Historical Provider, MD  medroxyPROGESTERone (DEPO-PROVERA) 150 MG/ML injection Inject 150 mg into the muscle every 3 (three) months.   Yes Historical Provider, MD  Prenatal Vit-Fe Fumarate-FA (PRENATAL MULTIVITAMIN) TABS tablet Take 1 tablet by mouth daily at 12 noon.   Yes Historical Provider, MD   BP 114/75  Pulse 61  Temp(Src) 99.2 F (37.3 C)  Resp 23  SpO2 100% Physical Exam  Nursing note and vitals reviewed. Constitutional: She is oriented to person, place, and time. She appears well-developed and well-nourished. No distress.  Patient tearful  HENT:  Head: Normocephalic and atraumatic.  Right Ear: External ear normal.  Left Ear: External ear normal.  Nose: Nose normal.  Mouth/Throat: Oropharynx is clear and moist. No oropharyngeal exudate.  Eyes: Conjunctivae  are normal.  Neck: Neck supple.  Cardiovascular: Normal rate, regular rhythm and normal heart sounds.   Pulmonary/Chest: Effort normal and breath sounds normal. No respiratory distress. She exhibits tenderness.  Abdominal: Soft.  Musculoskeletal: She exhibits no edema and no tenderness.  MAE x 4  Neurological: She is alert and oriented to person, place, and time.  Skin: Skin is warm and dry. She is not diaphoretic.  Psychiatric: Her speech is normal. Her mood appears anxious.    ED Course  Procedures (including critical care time) Medications  aspirin  tablet 325 mg (325 mg Oral Given 07/31/13 1312)  LORazepam (ATIVAN) injection 1 mg (1 mg Intravenous Given 07/31/13 1313)  morphine 4 MG/ML injection 4 mg (4 mg Intravenous Given 07/31/13 1352)    Labs Review Labs Reviewed  BASIC METABOLIC PANEL - Abnormal; Notable for the following:    Glucose, Bld 68 (*)    All other components within normal limits  D-DIMER, QUANTITATIVE - Abnormal; Notable for the following:    D-Dimer, Quant 1.70 (*)    All other components within normal limits  CBC  I-STAT TROPOININ, ED  POC URINE PREG, ED    Imaging Review Dg Chest Port 1 View  07/31/2013   CLINICAL DATA:  Chest pain  EXAM: PORTABLE CHEST - 1 VIEW  COMPARISON:  01/26/2012 acute abdomen series  FINDINGS: Patient rotated left. Normal heart size. Left costophrenic angle excluded. No right and no definite left pleural effusion. No pneumothorax. Clear lungs.  IMPRESSION: No acute cardiopulmonary disease.   Electronically Signed   By: Abigail Miyamoto M.D.   On: 07/31/2013 13:08     EKG Interpretation None      MDM   Final diagnoses:  Chest pain, unspecified chest pain type    Filed Vitals:   07/31/13 1524  BP: 114/75  Pulse: 61  Temp:   Resp: 23   Afebrile, NAD, non-toxic appearing, AAOx4.   Patient with CP and SOB x 2 days. Patient appears anxious. Regular, rate, and rhythm. Lungs clear to auscultation. Chest TTP. Unable to [facility] patient, D-dimer obtainied. CXR, EKG, Troponin negative. D-dimer elevated, CT angio chest will be obtained to r/o PE. Disposition will pend CT scan, if negative likely discharge home with PCP f/u and symptomatic measures. Signed out to Dr. Fredderick Severance. Patient d/w with Dr. Alvino Chapel, agrees with plan.      Harlow Mares, PA-C 07/31/13 1552

## 2013-07-31 NOTE — ED Notes (Signed)
Pt placed into room and pt is changing into gown. Amy, NT at bedside to place pt on monitor.

## 2013-07-31 NOTE — ED Notes (Signed)
Cp started yesterday took a tums did noy help hurts to move and  Hurts to btreath sore to touch

## 2013-07-31 NOTE — Discharge Instructions (Signed)
IMPRESSION: 1. No pulmonary emboli. Slight mosaic appearance of the pulmonary parenchyma can be seen with air-trapping. 2. The thymus has become more prominent and slightly nodular as compared to the study of 04/16/2006. The possibility of thymoma should be considered.   Chest Wall Pain Chest wall pain is pain in or around the bones and muscles of your chest. It may take up to 6 weeks to get better. It may take longer if you must stay physically active in your work and activities.  CAUSES  Chest wall pain may happen on its own. However, it may be caused by:  A viral illness like the flu.  Injury.  Coughing.  Exercise.  Arthritis.  Fibromyalgia.  Shingles. HOME CARE INSTRUCTIONS   Avoid overtiring physical activity. Try not to strain or perform activities that cause pain. This includes any activities using your chest or your abdominal and side muscles, especially if heavy weights are used.  Put ice on the sore area.  Put ice in a plastic bag.  Place a towel between your skin and the bag.  Leave the ice on for 15-20 minutes per hour while awake for the first 2 days.  Only take over-the-counter or prescription medicines for pain, discomfort, or fever as directed by your caregiver. SEEK IMMEDIATE MEDICAL CARE IF:   Your pain increases, or you are very uncomfortable.  You have a fever.  Your chest pain becomes worse.  You have new, unexplained symptoms.  You have nausea or vomiting.  You feel sweaty or lightheaded.  You have a cough with phlegm (sputum), or you cough up blood. MAKE SURE YOU:   Understand these instructions.  Will watch your condition.  Will get help right away if you are not doing well or get worse. Document Released: 12/29/2004 Document Revised: 03/23/2011 Document Reviewed: 08/25/2010 [facility] Patient Information 2015 [place], [place]. This information is not intended to replace advice given to you by your health care provider. Make sure you  discuss any questions you have with your health care provider.   Emergency Department Resource Guide 1) Find a Doctor and Pay Out of Pocket Although you won't have to find out who is covered by your insurance plan, it is a good idea to ask around and get recommendations. You will then need to call the office and see if the doctor you have chosen will accept you as a new patient and what types of options they offer for patients who are self-pay. Some doctors offer discounts or will set up payment plans for their patients who do not have insurance, but you will need to ask so you aren't surprised when you get to your appointment.  2) Contact Your Local Health Department Not all health departments have doctors that can see patients for sick visits, but many do, so it is worth a call to see if yours does. If you don't know where your local health department is, you can check in your phone book. The CDC also has a tool to help you locate your state's health department, and many state websites also have listings of all of their local health departments.  3) Find a [facility] If your illness is not likely to be very severe or complicated, you may want to try a walk in clinic. These are popping up all over the country in pharmacies, drugstores, and shopping centers. They're usually staffed by nurse practitioners or physician assistants that have been trained to treat common illnesses and complaints. They're usually fairly quick  and inexpensive. However, if you have serious medical issues or chronic medical problems, these are probably not your best option.  No Primary Care Doctor: - Call Health Connect at  [phone] - they can help you locate a primary care doctor that  accepts your insurance, provides certain services, etc. - Physician Referral Service- [phone]  Chronic Pain Problems: Organization         Address  Phone   Notes  [facility]  [phone] Patients need to  be referred by their primary care doctor.   Medication Assistance: Organization         Address  Phone   Notes  [facility] Medication [facility] [address] [place], [place] [address] [phone] --Must be a resident of [facility] -- Must have NO insurance coverage whatsoever (no Medicaid/ Medicare, etc.) -- The pt. MUST have a primary care doctor that directs their care regularly and follows them in the community   MedAssist  [phone]   [employer]  [phone]    Agencies that provide inexpensive medical care: Organization         Address  Phone   Notes  [place]  [phone]   [facility]    [phone]   [facility] [place], [place] [address] [phone]   [address] [place] [phone]   Planned Parenthood    [phone]   [facility]    [phone]   [place] and [place] [address], [place] Phone:  [phone], Fax:  [phone] Hours of Operation:  9 am - 6 pm, M-F.  Also accepts Medicaid/Medicare and self-pay.  [facility] for [place] [address], [address], [place] Phone: [phone], Fax: [phone]. Hours of Operation:  8:30 am - 5:30 pm, M-F.  Also accepts Medicaid and self-pay.  [facility] [place] [address] [place] Phone: [phone]   [place], [place], [place] [phone], Ext. [phone] Mondays & Thursdays: 7-9 AM.  First 15 patients are seen on a first come, first serve basis.    [place] Providers:  Organization         Address  Phone   Notes  [facility] [address],  [phone] Also accepts self-pay patients.  [facility] [address], [place]  [phone]   [place], [address], [place] [phone]   [facility] [address] [place] [phone]   Lucianne Lei [address], [place]   [phone] Only accepts [place] Access [place] patients after they have their name applied to their card.   Self-Pay (no insurance) in [place] [facility] Services:  Organization         Address  Phone   Notes  Sickle Cell Patients, [facility] [place] [phone]   [facility] [place] [phone]   [facility] [place]  [place], [place], [place] [phone]   [facility]/Dr. Osei-Bonsu  [address], [place] or [place], [address], [place] [phone] Phone number for  both [place] and [place] locations is the same.  [facility] [address] [place] [phone]   [facility] [address] [place] or [address] [phone]   [facility] [address] [place] [phone], phone; [phone], fax Sees patients 1st and 3rd Saturday of every month.  Must not qualify for public or private insurance (i.e. Medicaid, Medicare, [place] Health Choice, Veterans' Benefits)  Household income should be no more than 200% of the poverty level The clinic cannot treat you if you are pregnant or think you are pregnant  Sexually transmitted diseases are not treated at the clinic.    Dental Care: Organization         Address  Phone  Notes  [facility] Department of [facility] [phone] Accepts children up to age 2 who are enrolled in [place] or [place]; pregnant women with a Medicaid card; and children who have applied for Medicaid or [place] Health Choice, but were declined, whose parents can  pay a reduced fee at time of service.  [facility] Department of [facility]  [address], [place] [phone] Accepts children up to age 68 who are enrolled in [place] or [place]; pregnant women with a Medicaid card; and children who have applied for Medicaid or [place] Health Choice, but were declined, whose parents can pay a reduced fee at time of service.  [place] Adult Dental Access PROGRAM  [place] [phone] Patients are seen by appointment only. Walk-ins are not accepted. [place] will see patients 63 years of age and older. Monday - Tuesday (8am-5pm) Most Wednesdays (8:30-5pm) $30 per visit, cash only  [facility] Adult Dental Access PROGRAM  [address], [facility] [phone] Patients are seen by appointment only. Walk-ins are not accepted. [place] will see patients 18 years of age and older. One Wednesday Evening (Monthly: Volunteer Based).  $30 per visit, cash only  [place]  [phone] for adults; Children under age 43, call Graduate Pediatric Dentistry at [phone]. Children aged 17-14, please call [phone] to request a pediatric application.  Dental services are provided in all areas of dental care including fillings, crowns and bridges, complete and partial dentures, implants, gum treatment, root canals, and extractions. Preventive care is also provided. Treatment is provided to both adults and children. Patients are selected via a lottery and there is often a waiting list.   [facility] [address] [place]  [phone] [email or web address]   [facility] [address] [place], [place] [phone], Ext. [phone] Second and Fourth Thursday of each month, opens at 6:30 AM; Clinic ends at 9 AM.  Patients are seen on a first-come first-served basis, and a limited number are seen during each clinic.   [facility]  [address] Hillard Danker [place], [place] [phone]   Eligibility Requirements You must have lived in [place], [place], or [place] counties for at least the last three months.   You cannot be eligible for state or federal sponsored [employer], including [employer], [place], or [employer].   You generally cannot be eligible for healthcare insurance through your employer.    How to apply: Eligibility screenings are held every Tuesday and Wednesday afternoon from 1:00 pm until 4:00  pm. You do not need an appointment for the interview!  [facility] [address], [place], [place]   [place]  [place] Department  [place]  [phone]    Behavioral Health Resources in the Community: Intensive Outpatient Programs Organization         Address  Phone  Notes  [place]. [address] [place], [place] [phone]   [facility] Outpatient [address] [place], [place]   ADS: Alcohol & Drug Svcs [address] [place], [place]   [place] [address]  [place], [place] or [phone]   Substance Abuse Resources Organization         Address  Phone  Notes  Alcohol and Drug Services  [phone]   [place]  [phone]   [facility]   Chinita Pester  [phone]   Residential & Outpatient Substance Abuse Program  [phone]   Psychological Services Organization         Address  Phone  Notes  [facility] [place]  [place]  [phone]   [place] [address], [place] or [phone]    Mobile Crisis Teams Organization         Address  Phone  Notes  [facility], Mobile Crisis Care Unit  [phone]    Assertive Psychotherapeutic Services  [address] [place], [place]   [place] Levels [address] [place] [phone]    Self-Help/Support Groups Organization         Address  Phone             Notes  [place]. of [place] - variety of support groups  [place] Call for more information  Narcotics Anonymous (NA), Caring Services [address], [employer] [place]  2 meetings at this location   Special educational needs teacher         Address  Phone  Notes  ASAP Residential Treatment [place],    [place]  [phone]   [facility]  [address] [place] [address], [place], [place]   [place], [place] [phone] Admissions: 8am-3pm M-F  Incentives Substance [place] [address]    [place], [place] [phone]   [facility] [address] [place], [place], [place]   [facility] [address],  [place], [place]   [facility]ight Programs - Intensive Outpatient [address]een [address], [place], [place]   [facility] ([place] [address].,  [place], [place] [phone] or [phone]   [facility]idential Treatment Services (RTS) [address], [place], [place] Accepts Medicaid  [place] [address],  [place] [phone] Substance Abuse/Addiction Treatment   [facility] Organization         Address  Phone  Notes  [facility]  [phone]   Domenic Schwab, PhD [address] Arlis Porta [place], [place]   [phone] or [phone]   [place], [place] [phone]   [employer] [address], [place], [place] [phone] Insurance/Medicaid/sponsorship through [employer] and Families [address], [address]  [place], [place] [phone] [place], [place] [phone]    Dr. Adele Schilder  [phone]   [facility] Dept. 1) [address] [place] 2) [place] 3)  [place], [place] [phone]  [phone]   [place] [phone] or [phone] (After Hours)

## 2013-08-02 NOTE — ED Provider Notes (Signed)
Medical screening examination/treatment/procedure(s) were performed by non-physician practitioner and as supervising physician I was immediately available for consultation/collaboration.   EKG Interpretation   Date/Time:  Monday July 31 2013 11:58:33 EDT Ventricular Rate:  70 PR Interval:  132 QRS Duration: 88 QT Interval:  392 QTC Calculation: 423 R Axis:   78 Text Interpretation:  Normal sinus rhythm with sinus arrhythmia Normal ECG  ED PHYSICIAN INTERPRETATION AVAILABLE IN [facility] Confirmed by  TEST, Record ([id number]) on 08/02/2013 7:15:06 AM       Jasper Riling. Alvino Chapel, MD 08/02/13 [phone]

## 2013-08-03 NOTE — ED Provider Notes (Signed)
  Physical Exam  BP 111/64  Pulse 49  Temp(Src) 99.2 F (37.3 C)  Resp 14  SpO2 98%  Physical Exam  ED Course  Procedures  MDM 28 year old female that presents with chest pain and shortness of breath. Patient was found to have a positive d-dimer and is awaiting CT angiography for evaluation of PE. Patient was noted to not have a PE on the study patient instructed to take ibuprofen as needed for what is likely cough though musculoskeletal pain. Patient agrees with this plan and she was then discharged home patient remained stable in the emergency department and was in no acute distress      Claudean Severance, MD 08/03/13 [phone]

## 2013-08-04 NOTE — ED Provider Notes (Signed)
.  I agree with plan from sign out to follow up CT angio results and discharge.  Ct Angio Chest W/cm &/or Wo Cm  07/31/2013   CLINICAL DATA:  Chest pain.  EXAM: CT ANGIOGRAPHY CHEST WITH CONTRAST  TECHNIQUE: Multidetector CT imaging of the chest was performed using the standard protocol during bolus administration of intravenous contrast. Multiplanar CT image reconstructions and MIPs were obtained to evaluate the vascular anatomy.  CONTRAST:  160mL OMNIPAQUE IOHEXOL 350 MG/ML SOLN  COMPARISON:  Chest x-ray dated 07/31/2013 and a CT scan dated 04/16/2006  FINDINGS: There are no pulmonary emboli, infiltrates, or pleural effusions. There is a slight mosaic appearance of the lungs which can be seen with air trapping.  The thymus has become more prominent and slightly nodular in configuration as compared to the prior study, measuring 5.1 x 3.9 x 2.1 cm.  Heart size and pulmonary vascularity are normal. No adenopathy or mass lesions. No osseous abnormality. The visualized portion of the upper abdomen is normal.  Review of the MIP images confirms the above findings.  IMPRESSION: 1. No pulmonary emboli. Slight mosaic appearance of the pulmonary parenchyma can be seen with air-trapping. 2. The thymus has become more prominent and slightly nodular as compared to the study of 04/16/2006. The possibility of thymoma should be considered.   Electronically Signed   By: Rozetta Nunnery M.D.   On: 07/31/2013 16:55   Dg Chest Port 1 View  07/31/2013   CLINICAL DATA:  Chest pain  EXAM: PORTABLE CHEST - 1 VIEW  COMPARISON:  01/26/2012 acute abdomen series  FINDINGS: Patient rotated left. Normal heart size. Left costophrenic angle excluded. No right and no definite left pleural effusion. No pneumothorax. Clear lungs.  IMPRESSION: No acute cardiopulmonary disease.   Electronically Signed   By: Abigail Miyamoto M.D.   On: 07/31/2013 13:08   Mariea Clonts, MD 08/04/13 [phone]

## 2013-11-13 ENCOUNTER — Encounter (HOSPITAL_COMMUNITY): Payer: Self-pay | Admitting: Emergency Medicine

## 2013-12-14 ENCOUNTER — Encounter: Payer: Self-pay | Admitting: Obstetrics & Gynecology

## 2014-02-19 ENCOUNTER — Encounter (HOSPITAL_COMMUNITY): Payer: Self-pay | Admitting: *Deleted

## 2014-02-19 ENCOUNTER — Emergency Department (HOSPITAL_COMMUNITY): Payer: Medicaid Other

## 2014-02-19 ENCOUNTER — Emergency Department (HOSPITAL_COMMUNITY)
Admission: EM | Admit: 2014-02-19 | Discharge: 2014-02-19 | Disposition: A | Discharge status: Home / Self Care | Payer: Medicaid Other | Attending: Emergency Medicine | Admitting: Emergency Medicine

## 2014-02-19 DIAGNOSIS — Z8619 Personal history of other infectious and parasitic diseases: Secondary | ICD-10-CM | POA: Insufficient documentation

## 2014-02-19 DIAGNOSIS — J329 Chronic sinusitis, unspecified: Secondary | ICD-10-CM | POA: Insufficient documentation

## 2014-02-19 DIAGNOSIS — B9789 Other viral agents as the cause of diseases classified elsewhere: Secondary | ICD-10-CM

## 2014-02-19 DIAGNOSIS — R51 Headache: Secondary | ICD-10-CM | POA: Diagnosis present

## 2014-02-19 DIAGNOSIS — J069 Acute upper respiratory infection, unspecified: Secondary | ICD-10-CM | POA: Diagnosis not present

## 2014-02-19 DIAGNOSIS — Z8719 Personal history of other diseases of the digestive system: Secondary | ICD-10-CM | POA: Insufficient documentation

## 2014-02-19 DIAGNOSIS — Z720 Tobacco use: Secondary | ICD-10-CM | POA: Diagnosis not present

## 2014-02-19 DIAGNOSIS — Z79899 Other long term (current) drug therapy: Secondary | ICD-10-CM | POA: Diagnosis not present

## 2014-02-19 DIAGNOSIS — Z87440 Personal history of urinary (tract) infections: Secondary | ICD-10-CM | POA: Insufficient documentation

## 2014-02-19 DIAGNOSIS — D649 Anemia, unspecified: Secondary | ICD-10-CM | POA: Insufficient documentation

## 2014-02-19 LAB — RAPID STREP SCREEN (MED CTR MEBANE ONLY): Streptococcus, Group A Screen (Direct): NEGATIVE

## 2014-02-19 MED ORDER — KETOROLAC TROMETHAMINE 60 MG/2ML IM SOLN
60.0000 mg | Freq: Once | INTRAMUSCULAR | Status: AC
  Administered 2014-02-19: 60 mg via INTRAMUSCULAR
  Filled 2014-02-19: qty 2

## 2014-02-19 NOTE — ED Notes (Signed)
Declined W/C at D/C and was escorted to lobby by RN. 

## 2014-02-19 NOTE — ED Provider Notes (Signed)
CSN: [id number]     Arrival date & time 02/19/14  1241 History  This chart was scribed for non-physician practitioner,Zyrus Hetland Twanna Hy, PA-C,working with Leota Jacobsen, MD, by Ian Bushman, ED Scribe. This patient was seen in room TR10C/TR10C and the patient's care was started at 2:07 PM.    Chief Complaint  Patient presents with  . Sore Throat  . Facial Pain     (Consider location/radiation/quality/duration/timing/severity/associated sxs/prior Treatment) Patient is a 29 y.o. female presenting with pharyngitis. The history is provided by the patient. No language interpreter was used.  Sore Throat Associated symptoms include headaches.    HPI Comments: MARELLY WEHRMAN is a 29 y.o. female who presents to the Emergency Department complaining of a constant sore throat, facial pain, coughing,headache and congestion with yellow/gray phlegm onset 7 days ago.  Headache is like other sinus headaches and congestion she has had before. Not worse of life. Patient has been drinking lemon tea and taking night time alka seltzer but notes of no releif.  Patient denies fever/chills.    Patient notes that her son also has a little bit of a cold. Patient denies any other medical problems. Patient does not have high blood pressure.    Past Medical History  Diagnosis Date  . Abnormal Pap smear   . Urinary tract infection   . Gastritis   . Anemia   . Hx of chlamydia infection   . Vaginal Pap smear, abnormal    Past Surgical History  Procedure Laterality Date  . Leep    . Leep    . Hernia repair      As an infant   Family History  Problem Relation Age of Onset  . Hypertension Mother   . Fibromyalgia Mother   . Cirrhosis Father   . Kidney disease Father   . Asthma Brother   . Birth defects Son     hirschprungs   History  Substance Use Topics  . Smoking status: Current Every Day Smoker -- 0.25 packs/day for 14 years    Types: Cigarettes  . Smokeless tobacco: Never Used  . Alcohol  Use: 0.0 oz/week     Comment: Not since June 2014   OB History    Gravida Para Term Preterm AB TAB SAB Ectopic Multiple Living   3 3 3  0 0 0 0 0 0 3     Review of Systems  Constitutional: Negative for fever and chills.  HENT: Positive for congestion, rhinorrhea and sore throat.   Respiratory: Positive for cough.   Musculoskeletal: Positive for myalgias.  Neurological: Positive for headaches.      Allergies  Review of patient's allergies indicates no known allergies.  Home Medications   Prior to Admission medications   Medication Sig Start Date End Date Taking? Authorizing Provider  HYDROcodone-acetaminophen (NORCO/VICODIN) 5-325 MG per tablet Take 2 tablets by mouth every 4 (four) hours as needed for moderate pain or severe pain. 07/31/13   Claudean Severance, MD  ibuprofen (ADVIL,MOTRIN) 600 MG tablet Take 600 mg by mouth every 6 (six) hours as needed for moderate pain.    Historical Provider, MD  ketorolac (TORADOL) 10 MG tablet Take 1 tablet (10 mg total) by mouth every 6 (six) hours as needed. 07/31/13   Claudean Severance, MD  medroxyPROGESTERone (DEPO-PROVERA) 150 MG/ML injection Inject 150 mg into the muscle every 3 (three) months.    Historical Provider, MD  Prenatal Vit-Fe Fumarate-FA (PRENATAL MULTIVITAMIN) TABS tablet Take 1 tablet by mouth daily at 12  noon.    Historical Provider, MD   BP 112/89 mmHg  Pulse 95  Temp(Src) 98.2 F (36.8 C) (Oral)  Resp 18  Ht 5\' 7"  (1.702 m)  Wt 186 lb (84.369 kg)  BMI 29.12 kg/m2  SpO2 100%  LMP 02/16/2014  Breastfeeding? No Physical Exam  Constitutional: She appears well-developed and well-nourished. No distress.  HENT:  Head: Normocephalic and atraumatic.  Nose: Right sinus exhibits maxillary sinus tenderness. Right sinus exhibits no frontal sinus tenderness. Left sinus exhibits no maxillary sinus tenderness and no frontal sinus tenderness.  Mouth/Throat: Mucous membranes are normal. Posterior oropharyngeal edema and posterior  oropharyngeal erythema present. No oropharyngeal exudate.  Eyes: Conjunctivae and EOM are normal. Right eye exhibits no discharge. Left eye exhibits no discharge.  Neck: Normal range of motion. Neck supple.  Cardiovascular: Normal rate, regular rhythm and normal heart sounds.   Pulmonary/Chest: Effort normal and breath sounds normal. No respiratory distress. She has no wheezes. She has no rales.  Abdominal: Soft. Bowel sounds are normal. She exhibits no distension. There is no tenderness.  Lymphadenopathy:    She has cervical adenopathy.  Neurological: She is alert.  Skin: Skin is warm and dry. She is not diaphoretic.  Nursing note and vitals reviewed.   ED Course  Procedures (including critical care time) DIAGNOSTIC STUDIES: Oxygen Saturation is 100% on RA, normal by my interpretation.    COORDINATION OF CARE: 2:16 PM Discussed treatment plan with patient at beside, the patient agrees with the plan and has no further questions at this time.   Labs Review Labs Reviewed  RAPID STREP SCREEN  CULTURE, GROUP A STREP    Imaging Review Dg Chest 2 View  02/19/2014   CLINICAL DATA:  Wheezing and congested for 1 week  EXAM: CHEST  2 VIEW  COMPARISON:  Chest radiograph and chest CT July 31, 2013  FINDINGS: Lungs are clear. Heart size and pulmonary vascularity are normal. No adenopathy. No bone lesions.  IMPRESSION: No edema or consolidation.   Electronically Signed   By: Lowella Grip M.D.   On: 02/19/2014 14:47     EKG Interpretation None      MDM   Final diagnoses:  URI (upper respiratory infection)  Viral sinusitis   Pt CXR negative for acute infiltrate. Rapid strep negative. Uvula midline no trismus. No evidence of PTA. Patients symptoms are consistent with URI, likely viral etiology. Discussed that antibiotics are not indicated for viral infections. Pt will be discharged with symptomatic treatment, including pseudoephedrine. Pt without HTN. Verbalizes understanding and is  agreeable with plan. Pt is hemodynamically stable & in NAD prior to dc. Pt is to follow up with PCP/urgent care for persistent symptoms over 10 days.  Discussed return precautions with patient. Discussed all results and patient verbalizes understanding and agrees with plan.  I personally performed the services described in this documentation, which was scribed in my presence. The recorded information has been reviewed and is accurate.    Pura Spice, PA-C 02/19/14 1716  Leota Jacobsen, MD 02/20/14 [phone]

## 2014-02-19 NOTE — ED Notes (Signed)
Pt reports throat pain and facial pain.

## 2014-02-19 NOTE — Discharge Instructions (Signed)
Return to the emergency room with worsening of symptoms, new symptoms or with symptoms that are concerning, , especially fevers, stiff neck, worsening headache, nausea/vomiting, visual changes or slurred speech, chest pain, shortness of breath, cough with thick colored mucous or blood Drink plenty of fluids with electrolytes especially Gatorade. OTC cold medications such as mucinex, nyquil, dayquil are recommended. Chloraseptic for sore throat. Sudafed. Ask the pharmacist for this. Read below information on it. And follow recommendations. If your sinus symptoms persist for more than ten days go to an urgent care or your primary care provider. If you do not have a primary care provider use the below resources to establish care.  Sinusitis Sinusitis is redness, soreness, and inflammation of the paranasal sinuses. Paranasal sinuses are air pockets within the bones of your face (beneath the eyes, the middle of the forehead, or above the eyes). In healthy paranasal sinuses, mucus is able to drain out, and air is able to circulate through them by way of your nose. However, when your paranasal sinuses are inflamed, mucus and air can become trapped. This can allow bacteria and other germs to grow and cause infection. Sinusitis can develop quickly and last only a short time (acute) or continue over a long period (chronic). Sinusitis that lasts for more than 12 weeks is considered chronic.  CAUSES  Causes of sinusitis include:  Allergies.  Structural abnormalities, such as displacement of the cartilage that separates your nostrils (deviated septum), which can decrease the air flow through your nose and sinuses and affect sinus drainage.  Functional abnormalities, such as when the small hairs (cilia) that line your sinuses and help remove mucus do not work properly or are not present. SIGNS AND SYMPTOMS  Symptoms of acute and chronic sinusitis are the same. The primary symptoms are pain and pressure around the  affected sinuses. Other symptoms include:  Upper toothache.  Earache.  Headache.  Bad breath.  Decreased sense of smell and taste.  A cough, which worsens when you are lying flat.  Fatigue.  Fever.  Thick drainage from your nose, which often is green and may contain pus (purulent).  Swelling and warmth over the affected sinuses. DIAGNOSIS  Your health care provider will perform a physical exam. During the exam, your health care provider may:  Look in your nose for signs of abnormal growths in your nostrils (nasal polyps).  Tap over the affected sinus to check for signs of infection.  View the inside of your sinuses (endoscopy) using an imaging device that has a light attached (endoscope). If your health care provider suspects that you have chronic sinusitis, one or more of the following tests may be recommended:  Allergy tests.  Nasal culture. A sample of mucus is taken from your nose, sent to a lab, and screened for bacteria.  Nasal cytology. A sample of mucus is taken from your nose and examined by your health care provider to determine if your sinusitis is related to an allergy. TREATMENT  Most cases of acute sinusitis are related to a viral infection and will resolve on their own within 10 days. Sometimes medicines are prescribed to help relieve symptoms (pain medicine, decongestants, nasal steroid sprays, or saline sprays).  However, for sinusitis related to a bacterial infection, your health care provider will prescribe antibiotic medicines. These are medicines that will help kill the bacteria causing the infection.  Rarely, sinusitis is caused by a fungal infection. In theses cases, your health care provider will prescribe antifungal medicine. For some  cases of chronic sinusitis, surgery is needed. Generally, these are cases in which sinusitis recurs more than 3 times per year, despite other treatments. HOME CARE INSTRUCTIONS   Drink plenty of water. Water helps thin  the mucus so your sinuses can drain more easily.  Use a humidifier.  Inhale steam 3 to 4 times a day (for example, sit in the bathroom with the shower running).  Apply a warm, moist washcloth to your face 3 to 4 times a day, or as directed by your health care provider.  Use saline nasal sprays to help moisten and clean your sinuses.  Take medicines only as directed by your health care provider.  If you were prescribed either an antibiotic or antifungal medicine, finish it all even if you start to feel better. SEEK IMMEDIATE MEDICAL CARE IF:  You have increasing pain or severe headaches.  You have nausea, vomiting, or drowsiness.  You have swelling around your face.  You have vision problems.  You have a stiff neck.  You have difficulty breathing. MAKE SURE YOU:   Understand these instructions.  Will watch your condition.  Will get help right away if you are not doing well or get worse. Document Released: 12/29/2004 Document Revised: 05/15/2013 Document Reviewed: 01/13/2011 [facility] Patient Information 2015 [place], [place]. This information is not intended to replace advice given to you by your health care provider. Make sure you discuss any questions you have with your health care provider.   Pseudoephedrine tablets What is this medicine? PSEUDOEPHEDRINE (soo doe e FED rin) is a decongestant. It is used to treat congestion of the nose or sinuses. This medicine may be used for other purposes; ask your health care provider or pharmacist if you have questions. COMMON BRAND NAME(S): Contac Cold 12 Hour, Genaphed, NASAL Decongestant, Nexafed, Sudafed, Sudafed Congestion, Sudogest, Zephrex-D What should I tell my health care provider before I take this medicine? They need to know if you have any of the following conditions: -diabetes -glaucoma -heart disease -high blood pressure -kidney disease -prostate trouble -taken an MAOI like Carbex, Eldepryl, Marplan, Nardil, or  Parnate in last 14 days -thyroid disease -trouble passing urine -an unusual or allergic reaction to pseudoephedrine, other medicines, foods, dyes, or preservatives -pregnant or trying to get pregnant -breast-feeding How should I use this medicine? Take this medicine by mouth with a glass of water. Follow the directions on the package or prescription label. Take your medicine at regular intervals. Do not take your medicine more often than directed. Talk to your pediatrician regarding the use of this medicine in children. While this drug may be prescribed for children as young as 4 years of age for selected conditions, precautions do apply. Patients over 35 years old may have a stronger reaction and need a smaller dose. Overdosage: If you think you have taken too much of this medicine contact a poison control center or emergency room at once. NOTE: This medicine is only for you. Do not share this medicine with others. What if I miss a dose? If you miss a dose, take it as soon as you can. If it is almost time for your next dose, take only that dose. Do not take double or extra doses. What may interact with this medicine? Do not take this medicine with any of the following medications: -bromocriptine -ergot alkaloids like dihydroergotamine, ergonovine, ergotamine, methylergonovine -MAOIs like Carbex, Eldepryl, Marplan, Nardil, and Parnate -stimulant medicines for attention disorders, weight loss, or to stay awake This medicine may also  interact with the following medications: -alcohol -atropine -bretylium -caffeine -digoxin -linezolid -mecamylamine -medicines for blood pressure -medicines for depression, anxiety, or psychotic disturbances like fluoxetine, sertraline -medicines for enlarged prostate -medicines for sleep -other medicines for cold, cough, or allergy -procarbazine -reserpine -some heart medicines like metoprolol -St. John's Wort This list may not describe all possible  interactions. Give your health care provider a list of all the medicines, herbs, non-prescription drugs, or dietary supplements you use. Also tell them if you smoke, drink alcohol, or use illegal drugs. Some items may interact with your medicine. What should I watch for while using this medicine? Tell your doctor or healthcare professional if your symptoms do not start to get better or if they get worse. See your doctor if you are not better in 7 days or if you have a fever. What side effects may I notice from receiving this medicine? Side effects that you should report to your doctor or health care professional as soon as possible: -allergic reactions like skin rash, itching or hives, swelling of the face, lips, or tongue -bloody diarrhea with stomach pain -breathing problems -chest pain -confused, agitated, nervous -fast, irregular heartbeat -feeling faint or lightheaded, falls -hallucinations -high blood pressure -pain, tingling, numbness in the hands or feet -trouble passing urine or change in the amount of urine -trouble sleeping Side effects that usually do not require medical attention (report to your doctor or health care professional if they continue or are bothersome): -headache -loss of appetite -nausea, stomach upset This list may not describe all possible side effects. Call your doctor for medical advice about side effects. You may report side effects to FDA at 1-800-FDA-1088. Where should I keep my medicine? Keep out of the reach of children. Store at room temperature between 15 and 25 degrees C (59 and 77 degrees F). Protect from heat and moisture. Throw away any unused medicine after the expiration date. NOTE: This sheet is a summary. It may not cover all possible information. If you have questions about this medicine, talk to your doctor, pharmacist, or health care provider.  2015, Elsevier/Gold Standard. (2007-08-01 15:31:39)   Emergency Department Resource Guide 1)  Find a Doctor and Pay Out of Pocket Although you won't have to find out who is covered by your insurance plan, it is a good idea to ask around and get recommendations. You will then need to call the office and see if the doctor you have chosen will accept you as a new patient and what types of options they offer for patients who are self-pay. Some doctors offer discounts or will set up payment plans for their patients who do not have insurance, but you will need to ask so you aren't surprised when you get to your appointment.  2) Contact Your Local Health Department Not all health departments have doctors that can see patients for sick visits, but many do, so it is worth a call to see if yours does. If you don't know where your local health department is, you can check in your phone book. The CDC also has a tool to help you locate your state's health department, and many state websites also have listings of all of their local health departments.  3) Find a [facility] If your illness is not likely to be very severe or complicated, you may want to try a walk in clinic. These are popping up all over the country in pharmacies, drugstores, and shopping centers. They're usually staffed by nurse practitioners or  physician assistants that have been trained to treat common illnesses and complaints. They're usually fairly quick and inexpensive. However, if you have serious medical issues or chronic medical problems, these are probably not your best option.  No Primary Care Doctor: - Call Health Connect at  [phone] - they can help you locate a primary care doctor that  accepts your insurance, provides certain services, etc. - Physician Referral Service- [phone]  Chronic Pain Problems: Organization         Address  Phone   Notes  [facility]  [phone] Patients need to be referred by their primary care doctor.   Medication Assistance: Organization         Address  Phone    Notes  [facility] Medication [facility] [address] [place], [place] [address] [phone] --Must be a resident of [place] -- Must have NO insurance coverage whatsoever (no Medicaid/ Medicare, etc.) -- The pt. MUST have a primary care doctor that directs their care regularly and follows them in the community   MedAssist  [phone]   [employer]  [phone]    Agencies that provide inexpensive medical care: Organization         Address  Phone   Notes  [place]  [phone]   [facility]    [phone]   [facility] [place], [place] [address] [phone]   [place] [address], [place] [phone]   Planned Parenthood    [phone]   [facility]    [phone]   [place] and [address], [place] Phone:  [phone], Fax:  [phone] Hours of Operation:  9 am - 6 pm, M-F.  Also accepts Medicaid/Medicare and self-pay.  [facility] for [place], [address], [place] Phone: [phone], Fax: [phone]. Hours of Operation:  8:30 am - 5:30 pm, M-F.  Also accepts Medicaid and self-pay.  [facility] [address], [place] Phone: [phone]   [place], [place], [place] [phone], Ext. [phone] Mondays & Thursdays: 7-9 AM.  First 15 patients are seen on a first come, first serve basis.    [place] Providers:  Organization         Address  Phone   Notes  [facility] [address], [place] [phone] Also accepts self-pay patients.  [facility] [address], [place]  [phone]   [place], [address], [place] [phone]   [facility] [address] [place] [phone]   Lucianne Lei [address], [place]   [phone] Only accepts [place] Access [place] patients after they have their name applied to their card.   Self-Pay (no insurance) in [facility]:  Organization         Address  Phone   Notes  Sickle Cell Patients, [facility] [place] [phone]   [facility] [place] [phone]   [facility] [place]  [address], [place] [phone]   [facility]/Dr. Vista Lawman  [address], [place] or [address], [place] [phone] Phone number for both [place] and [place] locations is the same.  [facility] [address] [place] [phone]   [facility] [address] [place] or [address] [phone]   [facility] [address], [place] [phone], phone; [phone], fax Sees patients 1st and 3rd Saturday of every month.  Must not qualify for public or private insurance (i.e. Medicaid, Medicare, [place] Health Choice, Veterans' Benefits)  Household income should be no more than 200% of the poverty level The clinic cannot treat you if you are pregnant or think you are pregnant  Sexually transmitted diseases are not treated at the clinic.    Dental Care: Organization         Address  Phone  Notes  [facility] of [facility] [place] [phone] Accepts children up to age 45 who are enrolled in [place] or [place]; pregnant women with a Medicaid card; and children who have applied for Medicaid or [place] Health Choice, but were declined, whose parents can pay a reduced fee at time of service.  [facility]  [address], [place] [phone] Accepts children up to age 28 who are enrolled in [place] or [place]; pregnant women with a Medicaid card; and children who have applied for Medicaid or [place] Health Choice, but were declined, whose parents can pay a reduced fee at time of service.  [place] Adult Dental Access PROGRAM  [place] [phone] Patients are seen by appointment only. Walk-ins are not accepted. [place] will see patients 30 years of age and older. Monday - Tuesday (8am-5pm) Most Wednesdays (8:30-5pm) $30 per visit, cash only  [facility] Adult Dental Access PROGRAM  [address], [facility] [phone] Patients are seen by appointment only. Walk-ins are not accepted. [place] will see patients 64 years of age and older. One Wednesday Evening (Monthly: Volunteer Based).  $30 per visit, cash only  [place]  [phone] for adults; Children under age 2, call Graduate Pediatric Dentistry at [phone]. Children aged 82-14, please call [phone] to request a pediatric application.  Dental services are provided in all areas of dental care including fillings, crowns and bridges, complete and partial dentures, implants, gum treatment, root canals, and extractions. Preventive care is also provided. Treatment is provided to both adults and children. Patients are selected via a lottery and there is often a waiting list.   [facility] [address], [place]  [phone] [email or web address]   [facility] [address] [place], [place] [phone], Ext. [phone] Second and Fourth Thursday of each month, opens at 6:30 AM; Clinic ends at 9 AM.  Patients are seen on a first-come first-served basis, and a limited number are seen during each clinic.   [facility]  [address] Hillard Danker [place], [place] [phone]   Eligibility Requirements You must have lived in  [place], [place], or [place] counties for at least the last three months.   You cannot be eligible for state or federal sponsored [employer], including [employer], [place], or [employer].   You generally cannot be eligible for healthcare insurance through your employer.    How  to apply: Eligibility screenings are held every Tuesday and Wednesday afternoon from 1:00 pm until 4:00 pm. You do not need an appointment for the interview!  [facility] [address] [place], [place]   [place]  [place] Department  [place]  [phone]    Behavioral Health Resources in the Community: Intensive Outpatient Programs Organization         Address  Phone  Notes  [place]organ [place]. [address], [place], [place] [phone]   [facility] [address], [place], [place]   ADS: Alcohol & Drug Svcs [address], [place], [place]   [place] [address],  [place], [place] or [phone]   Substance Abuse Resources Organization         Address  Phone  Notes  Alcohol and Drug Services  [phone]   [place]  [phone]   The [place]   Chinita Pester  [phone]   Residential & Outpatient Substance Abuse Program  [phone]   Psychological Services Organization         Address  Phone  Notes  [facility] [place]  [place]  [phone]   [place] [address], [place] or [phone]    Mobile Crisis Teams Organization         Address  Phone  Notes  [facility], Mobile Crisis Care Unit  [phone]   [facility]  [address] [place], [place]   [place] Levels [address], [place] [phone]    Self-Help/Support Groups Organization         Address  Phone             Notes  [place]. of [place] - variety of support groups  [place] Call for more information  Narcotics Anonymous (NA), [facility] [address], [employer] [place]  2 meetings at this location   Special educational needs teacher         Address  Phone  Notes  ASAP Residential Treatment [place],    [place]  [phone]   [facility]  [address], [place] [address], [place], [place]   [place], [place] [phone] Admissions: 8am-3pm M-F  Incentives Substance [place] [address]    [place], [place] [phone]   [facility] [address] [place], [place], [place]   [facility] [address],  [place], [place]   [facility] - Intensive Outpatient [address] Kristeen [address], [place], [place]   [facility] ([place] [place].,  [place], [place] [phone] or [phone]   [facility]idential Treatment Services (RTS) [address] [place], [place] Accepts Medicaid  [facility] [place] [address],  [place] [phone] Substance Abuse/Addiction Treatment   [facility] Organization         Address  Phone  Notes  [facility]  [phone]   Domenic Schwab, PhD [address] [place]lis [place], [place]   [phone] or [phone]   [place]   [address] [place], [place] [phone]   [facility] Recovery [place] [address] [place],  [place] [phone] Insurance/Medicaid/sponsorship through [facility] and Families [address] [place], [place] [phone] [place], [place] [phone]    Dr. Adele Schilder  [phone]   [facility] Dept. 1) [address], [place] 2) [place] 3)  [address], [place] [phone]  [phone]   [place] [phone] or [phone] (After Hours)

## 2014-02-21 LAB — CULTURE, GROUP A STREP

## 2014-03-16 ENCOUNTER — Emergency Department (HOSPITAL_COMMUNITY)
Admission: EM | Admit: 2014-03-16 | Discharge: 2014-03-16 | Disposition: A | Discharge status: Home / Self Care | Payer: Medicaid Other | Attending: Emergency Medicine | Admitting: Emergency Medicine

## 2014-03-16 ENCOUNTER — Encounter (HOSPITAL_COMMUNITY): Payer: Self-pay | Admitting: Emergency Medicine

## 2014-03-16 DIAGNOSIS — Z8619 Personal history of other infectious and parasitic diseases: Secondary | ICD-10-CM | POA: Insufficient documentation

## 2014-03-16 DIAGNOSIS — Z720 Tobacco use: Secondary | ICD-10-CM | POA: Insufficient documentation

## 2014-03-16 DIAGNOSIS — Z87440 Personal history of urinary (tract) infections: Secondary | ICD-10-CM | POA: Diagnosis not present

## 2014-03-16 DIAGNOSIS — J029 Acute pharyngitis, unspecified: Secondary | ICD-10-CM | POA: Diagnosis present

## 2014-03-16 DIAGNOSIS — Z79899 Other long term (current) drug therapy: Secondary | ICD-10-CM | POA: Diagnosis not present

## 2014-03-16 DIAGNOSIS — Z8719 Personal history of other diseases of the digestive system: Secondary | ICD-10-CM | POA: Diagnosis not present

## 2014-03-16 DIAGNOSIS — D649 Anemia, unspecified: Secondary | ICD-10-CM | POA: Diagnosis not present

## 2014-03-16 MED ORDER — KETOROLAC TROMETHAMINE 60 MG/2ML IM SOLN
60.0000 mg | Freq: Once | INTRAMUSCULAR | Status: AC
  Administered 2014-03-16: 60 mg via INTRAMUSCULAR
  Filled 2014-03-16: qty 2

## 2014-03-16 MED ORDER — PENICILLIN V POTASSIUM 500 MG PO TABS: 500.0000 mg | ORAL_TABLET | Freq: Four times a day (QID) | ORAL | Status: AC

## 2014-03-16 MED ORDER — HYDROCODONE-ACETAMINOPHEN 5-325 MG PO TABS: 2.0000 | ORAL_TABLET | ORAL | Status: DC | PRN

## 2014-03-16 NOTE — ED Notes (Signed)
Pt was here 2/8 for sore throat. States it has not improved. States "it hurts to swallow my spit".

## 2014-03-16 NOTE — Discharge Instructions (Signed)

## 2014-03-16 NOTE — ED Provider Notes (Signed)
CSN: [id number]     Arrival date & time 03/16/14  1030 History  This chart was scribed for non-physician practitioner, Alyse Low, PA-C, working with Charlesetta Shanks, MD by Ladene Artist, ED Scribe. This patient was seen in room TR09C/TR09C and the patient's care was started at 11:15 AM.   Chief Complaint  Patient presents with  . Sore Throat   HPI HPI Comments: Dawn Brock is a 29 y.o. female, with a h/o anemia, who presents to the Emergency Department complaining of constant sore throat for 3.5 weeks. Pt reports that severe sore throat is exacerbated with talking and swallowing. Pt was seen on 02/19/14 for same symptoms. She had a negative strep and was diagnosed with a viral illness at that time. She reports associated tonsillar swelling and productive cough with phlegm. She denies fever. Pt states that her children were sick with similar symptoms a few weeks ago. No known allergies.   Past Medical History  Diagnosis Date  . Abnormal Pap smear   . Urinary tract infection   . Gastritis   . Anemia   . Hx of chlamydia infection   . Vaginal Pap smear, abnormal    Past Surgical History  Procedure Laterality Date  . Leep    . Leep    . Hernia repair      As an infant   Family History  Problem Relation Age of Onset  . Hypertension Mother   . Fibromyalgia Mother   . Cirrhosis Father   . Kidney disease Father   . Asthma Brother   . Birth defects Son     hirschprungs   History  Substance Use Topics  . Smoking status: Current Every Day Smoker -- 0.25 packs/day for 14 years    Types: Cigarettes  . Smokeless tobacco: Never Used  . Alcohol Use: 0.0 oz/week     Comment: Not since June 2014   OB History    Gravida Para Term Preterm AB TAB SAB Ectopic Multiple Living   3 3 3  0 0 0 0 0 0 3     Review of Systems  Constitutional: Negative for fever.  HENT: Positive for sore throat.   Respiratory: Positive for cough.    Allergies  Review of patient's allergies indicates no  known allergies.  Home Medications   Prior to Admission medications   Medication Sig Start Date End Date Taking? Authorizing Provider  HYDROcodone-acetaminophen (NORCO/VICODIN) 5-325 MG per tablet Take 2 tablets by mouth every 4 (four) hours as needed for moderate pain or severe pain. 07/31/13   Claudean Severance, MD  ibuprofen (ADVIL,MOTRIN) 600 MG tablet Take 600 mg by mouth every 6 (six) hours as needed for moderate pain.    Historical Provider, MD  ketorolac (TORADOL) 10 MG tablet Take 1 tablet (10 mg total) by mouth every 6 (six) hours as needed. 07/31/13   Claudean Severance, MD  medroxyPROGESTERone (DEPO-PROVERA) 150 MG/ML injection Inject 150 mg into the muscle every 3 (three) months.    Historical Provider, MD  Prenatal Vit-Fe Fumarate-FA (PRENATAL MULTIVITAMIN) TABS tablet Take 1 tablet by mouth daily at 12 noon.    Historical Provider, MD   BP 127/90 mmHg  Pulse 96  Temp(Src) 99.5 F (37.5 C) (Oral)  Resp 20  SpO2 100%  LMP 02/16/2014 Physical Exam  Constitutional: She is oriented to person, place, and time. She appears well-developed and well-nourished. No distress.  HENT:  Head: Normocephalic and atraumatic.  Mouth/Throat: Uvula is midline. Posterior oropharyngeal erythema present.  Tonsillar  swelling.   Eyes: Conjunctivae and EOM are normal.  Neck: Neck supple. No tracheal deviation present.  Cardiovascular: Normal rate.   Pulmonary/Chest: Effort normal. No respiratory distress.  Musculoskeletal: Normal range of motion.  Neurological: She is alert and oriented to person, place, and time.  Skin: Skin is warm and dry.  Psychiatric: She has a normal mood and affect. Her behavior is normal.  Nursing note and vitals reviewed.  ED Course  Procedures (including critical care time) DIAGNOSTIC STUDIES: Oxygen Saturation is 100% on RA, normal by my interpretation.    COORDINATION OF CARE: 11:20 AM-Discussed treatment plan which includes Toradol injection, Vicodin and Veetid with pt  at bedside and pt agreed to plan.   Labs Review Labs Reviewed - No data to display  Imaging Review No results found.   EKG Interpretation None      MDM   Final diagnoses:  Pharyngitis     Meds ordered this encounter  Medications  . ketorolac (TORADOL) injection 60 mg    Sig:   . HYDROcodone-acetaminophen (NORCO/VICODIN) 5-325 MG per tablet    Sig: Take 2 tablets by mouth every 4 (four) hours as needed.    Dispense:  16 tablet    Refill:  0    Order Specific Question:  Supervising Provider    Answer:  Charlesetta Shanks [id number]  . penicillin v potassium (VEETID) 500 MG tablet    Sig: Take 1 tablet (500 mg total) by mouth 4 (four) times daily.    Dispense:  40 tablet    Refill:  0    Order Specific Question:  Supervising Provider    Answer:  Charlesetta Shanks [[id number]]    Hollace Kinnier [place], PA-C 03/16/14 [place], MD 03/16/14 [phone]

## 2014-03-17 ENCOUNTER — Emergency Department (HOSPITAL_COMMUNITY)
Admission: EM | Admit: 2014-03-17 | Discharge: 2014-03-18 | Disposition: A | Discharge status: Home / Self Care | Payer: Medicaid Other | Attending: Emergency Medicine | Admitting: Emergency Medicine

## 2014-03-17 ENCOUNTER — Encounter (HOSPITAL_COMMUNITY): Payer: Self-pay | Admitting: Emergency Medicine

## 2014-03-17 ENCOUNTER — Emergency Department (HOSPITAL_COMMUNITY): Payer: Medicaid Other

## 2014-03-17 DIAGNOSIS — Z862 Personal history of diseases of the blood and blood-forming organs and certain disorders involving the immune mechanism: Secondary | ICD-10-CM | POA: Insufficient documentation

## 2014-03-17 DIAGNOSIS — Z720 Tobacco use: Secondary | ICD-10-CM | POA: Diagnosis not present

## 2014-03-17 DIAGNOSIS — Z8619 Personal history of other infectious and parasitic diseases: Secondary | ICD-10-CM | POA: Insufficient documentation

## 2014-03-17 DIAGNOSIS — I889 Nonspecific lymphadenitis, unspecified: Secondary | ICD-10-CM | POA: Diagnosis not present

## 2014-03-17 DIAGNOSIS — J029 Acute pharyngitis, unspecified: Secondary | ICD-10-CM | POA: Diagnosis present

## 2014-03-17 DIAGNOSIS — Z87440 Personal history of urinary (tract) infections: Secondary | ICD-10-CM | POA: Diagnosis not present

## 2014-03-17 DIAGNOSIS — M898X9 Other specified disorders of bone, unspecified site: Secondary | ICD-10-CM | POA: Diagnosis not present

## 2014-03-17 DIAGNOSIS — J039 Acute tonsillitis, unspecified: Secondary | ICD-10-CM | POA: Diagnosis not present

## 2014-03-17 DIAGNOSIS — M8480 Other disorders of continuity of bone, unspecified site: Secondary | ICD-10-CM

## 2014-03-17 DIAGNOSIS — D169 Benign neoplasm of bone and articular cartilage, unspecified: Secondary | ICD-10-CM

## 2014-03-17 DIAGNOSIS — Z8719 Personal history of other diseases of the digestive system: Secondary | ICD-10-CM | POA: Insufficient documentation

## 2014-03-17 LAB — CBC WITH DIFFERENTIAL/PLATELET
Basophils Absolute: 0 10*3/uL (ref 0.0–0.1)
Basophils Relative: 0 % (ref 0–1)
Eosinophils Absolute: 0 10*3/uL (ref 0.0–0.7)
Eosinophils Relative: 0 % (ref 0–5)
HCT: 42.9 % (ref 36.0–46.0)
Hemoglobin: 14.6 g/dL (ref 12.0–15.0)
Lymphocytes Relative: 20 % (ref 12–46)
Lymphs Abs: 3.1 10*3/uL (ref 0.7–4.0)
MCH: 31.7 pg (ref 26.0–34.0)
MCHC: 34 g/dL (ref 30.0–36.0)
MCV: 93.3 fL (ref 78.0–100.0)
Monocytes Absolute: 1.1 10*3/uL — ABNORMAL HIGH (ref 0.1–1.0)
Monocytes Relative: 7 % (ref 3–12)
Neutro Abs: 10.9 10*3/uL — ABNORMAL HIGH (ref 1.7–7.7)
Neutrophils Relative %: 73 % (ref 43–77)
Platelets: 215 10*3/uL (ref 150–400)
RBC: 4.6 MIL/uL (ref 3.87–5.11)
RDW: 12.8 % (ref 11.5–15.5)
WBC: 15.1 10*3/uL — ABNORMAL HIGH (ref 4.0–10.5)

## 2014-03-17 LAB — BASIC METABOLIC PANEL
Anion gap: 6 (ref 5–15)
BUN: <5 mg/dL — ABNORMAL LOW (ref 6–23)
CO2: 26 mmol/L (ref 19–32)
Calcium: 9.2 mg/dL (ref 8.4–10.5)
Chloride: 100 mmol/L (ref 96–112)
Creatinine, Ser: 0.73 mg/dL (ref 0.50–1.10)
GFR calc Af Amer: >90 mL/min (ref >90)
GFR calc non Af Amer: >90 mL/min (ref >90)
Glucose, Bld: 103 mg/dL — ABNORMAL HIGH (ref 70–99)
Potassium: 3.3 mmol/L — ABNORMAL LOW (ref 3.5–5.1)
Sodium: 132 mmol/L — ABNORMAL LOW (ref 135–145)

## 2014-03-17 LAB — RAPID STREP SCREEN (MED CTR MEBANE ONLY): Streptococcus, Group A Screen (Direct): NEGATIVE

## 2014-03-17 LAB — MONONUCLEOSIS SCREEN: Mono Screen: NEGATIVE

## 2014-03-17 MED ORDER — SODIUM CHLORIDE 0.9 % IV BOLUS (SEPSIS)
1000.0000 mL | Freq: Once | INTRAVENOUS | Status: AC
  Administered 2014-03-17: 1000 mL via INTRAVENOUS

## 2014-03-17 MED ORDER — DEXAMETHASONE SODIUM PHOSPHATE 10 MG/ML IJ SOLN
10.0000 mg | Freq: Once | INTRAMUSCULAR | Status: AC
  Administered 2014-03-17: 10 mg via INTRAVENOUS
  Filled 2014-03-17: qty 1

## 2014-03-17 MED ORDER — IOHEXOL 300 MG/ML  SOLN
100.0000 mL | Freq: Once | INTRAMUSCULAR | Status: AC | PRN
  Administered 2014-03-17: 85 mL via INTRAVENOUS

## 2014-03-17 MED ORDER — HYDROCODONE-ACETAMINOPHEN 7.5-325 MG/15ML PO SOLN
15.0000 mL | Freq: Once | ORAL | Status: DC
  Filled 2014-03-17: qty 15

## 2014-03-17 MED ORDER — MORPHINE SULFATE 4 MG/ML IJ SOLN
4.0000 mg | Freq: Once | INTRAMUSCULAR | Status: AC
  Administered 2014-03-17: 4 mg via INTRAVENOUS
  Filled 2014-03-17: qty 1

## 2014-03-17 NOTE — ED Notes (Signed)
The patient was seen here for the same thing yesterday.  Today she presents with the same thing.  She was seen here and given antibiotics and sent home.

## 2014-03-17 NOTE — ED Provider Notes (Signed)
Patient presented to the ER with severe sore throat. Patient reports that symptoms began ongoing for more than 3 weeks. She was seen in the ER yesterday and started on antibiotics, has not had any improvement. Patient reports voice change and difficulty swallowing because of pain.  Face to face Exam: HEENT - PERRLA; oropharyngeal examination reveals bilateral tonsillar enlargement with erythema and exudate, uvula midline Lungs - CTAB Heart - RRR, no M/R/G Abd - S/NT/ND Neuro - alert, oriented x3  Plan: IV fluids, steroids, CT scan to rule out abscess.   Orpah Greek, MD 03/17/14 2132

## 2014-03-17 NOTE — ED Provider Notes (Signed)
CSN: [id number]     Arrival date & time 03/17/14  2043 History  This chart is scribed for non-physician practitioner, Noland Fordyce, PA-C, working with Orpah Greek, MD by Chester Holstein, ED Scribe.  This patient was seen in room TR08C/TR08C and the patient's care was started 9:19 PM.     Chief Complaint  Patient presents with  . Sore Throat    The patient was seen here for the same thing yesterday.  Today she presents with the same thing.  She was seen here and given antibiotics and sent home.     Patient is a 29 y.o. female presenting with pharyngitis. The history is provided by the patient. No language interpreter was used.  Sore Throat   HPI Comments: Dawn Brock is a 29 y.o. female brought in by ambulance, who presents to the Emergency Department complaining of sore throat with onset 3.5 weeks ago, worsening today. Pt was seen in the ED yesterday for same. She was given a prescription for penicillin and has been compliant with taking the medication but states she is still having difficulty talking and swallowing.  Throat pain is 10/10.  Pt denies fever, nausea and vomiting. Denies difficulty breathing.   Past Medical History  Diagnosis Date  . Abnormal Pap smear   . Urinary tract infection   . Gastritis   . Anemia   . Hx of chlamydia infection   . Vaginal Pap smear, abnormal    Past Surgical History  Procedure Laterality Date  . Leep    . Leep    . Hernia repair      As an infant   Family History  Problem Relation Age of Onset  . Hypertension Mother   . Fibromyalgia Mother   . Cirrhosis Father   . Kidney disease Father   . Asthma Brother   . Birth defects Son     hirschprungs   History  Substance Use Topics  . Smoking status: Current Every Day Smoker -- 0.25 packs/day for 14 years    Types: Cigarettes  . Smokeless tobacco: Never Used  . Alcohol Use: 0.0 oz/week     Comment: Not since June 2014   OB History    Gravida Para Term Preterm AB TAB  SAB Ectopic Multiple Living   3 3 3  0 0 0 0 0 0 3     Review of Systems  Constitutional: Negative for fever.  HENT: Positive for sore throat and trouble swallowing.   Gastrointestinal: Negative for nausea and vomiting.  All other systems reviewed and are negative.     Allergies  Review of patient's allergies indicates no known allergies.  Home Medications   Prior to Admission medications   Medication Sig Start Date End Date Taking? Authorizing Provider  HYDROcodone-acetaminophen (HYCET) 7.5-325 mg/15 ml solution Take 15 mLs by mouth 4 (four) times daily as needed for moderate pain or severe pain. 03/18/14 03/18/15  Noland Fordyce, PA-C  ibuprofen (ADVIL,MOTRIN) 600 MG tablet Take 600 mg by mouth every 6 (six) hours as needed for moderate pain.    Historical Provider, MD  ketorolac (TORADOL) 10 MG tablet Take 1 tablet (10 mg total) by mouth every 6 (six) hours as needed. 07/31/13   Claudean Severance, MD  medroxyPROGESTERone (DEPO-PROVERA) 150 MG/ML injection Inject 150 mg into the muscle every 3 (three) months.    Historical Provider, MD  penicillin v potassium (VEETID) 500 MG tablet Take 1 tablet (500 mg total) by mouth 4 (four) times daily. 03/16/14  03/23/14  Fransico Meadow, PA-C  Prenatal Vit-Fe Fumarate-FA (PRENATAL MULTIVITAMIN) TABS tablet Take 1 tablet by mouth daily at 12 noon.    Historical Provider, MD   BP 109/76 mmHg  Pulse 100  Temp(Src) 98.7 F (37.1 C) (Oral)  Resp 20  SpO2 100%  LMP 02/16/2014 Physical Exam  Constitutional: She is oriented to person, place, and time. She appears well-developed and well-nourished.  HENT:  Head: Normocephalic and atraumatic.  Mouth/Throat: Uvula is midline and mucous membranes are normal. There is trismus in the jaw. Uvula swelling present. Posterior oropharyngeal edema and posterior oropharyngeal erythema present. No oropharyngeal exudate. Tonsillar abscesses: questionable.  Eyes: EOM are normal.  Neck: Normal range of motion. Neck supple.   Cardiovascular: Normal rate, regular rhythm and normal heart sounds.   Pulmonary/Chest: Effort normal and breath sounds normal. No respiratory distress.  Abdominal: Soft. There is no tenderness.  Musculoskeletal: Normal range of motion.  Neurological: She is alert and oriented to person, place, and time.  Skin: Skin is warm and dry.  Psychiatric: She has a normal mood and affect. Her behavior is normal.  Nursing note and vitals reviewed.   ED Course  Procedures (including critical care time) DIAGNOSTIC STUDIES: Oxygen Saturation is 98% on room air, normal by my interpretation.    COORDINATION OF CARE: 9:24 PM Discussed treatment plan with patient at beside, the patient agrees with the plan and has no further questions at this time.   Labs Review Labs Reviewed  CBC WITH DIFFERENTIAL/PLATELET - Abnormal; Notable for the following:    WBC 15.1 (*)    Neutro Abs 10.9 (*)    Monocytes Absolute 1.1 (*)    All other components within normal limits  BASIC METABOLIC PANEL - Abnormal; Notable for the following:    Sodium 132 (*)    Potassium 3.3 (*)    Glucose, Bld 103 (*)    BUN <5 (*)    All other components within normal limits  RAPID STREP SCREEN  CULTURE, GROUP A STREP  MONONUCLEOSIS SCREEN    Imaging Review Ct Soft Tissue Neck W Contrast  03/17/2014   CLINICAL DATA:  Sore throat and difficulty swallowing.  EXAM: CT NECK WITH CONTRAST  TECHNIQUE: Multidetector CT imaging of the neck was performed using the standard protocol following the bolus administration of intravenous contrast.  CONTRAST:  45mL OMNIPAQUE IOHEXOL 300 MG/ML  SOLN  COMPARISON:  None.  FINDINGS: Pharynx and larynx: Enlarged bilateral palatine tonsils, touching in the midline. There is low attenuation within the crypts suggesting pus. Mild peripharyngeal fat inflammation without abscess or gas. There is no retropharyngeal edema or abscess. No major venous occlusion.  Salivary glands: Normal  Thyroid: Normal  Lymph  nodes: Reactive appearing bilateral cervical chain node enlargement. No suppurative changes.  Vascular: Negative  Limited intracranial: Negative  Visualized orbits: Negative  Mastoids and visualized paranasal sinuses: There is mucosal thickening in the bilateral maxillary and ethmoid sinuses with minimal fluid in the left maxillary sinus.  Skeleton: 2 sclerotic lesions within the right mandible, associated with the first and third molars (the first molar is vital, third molar rudimentary and unerrupted). There is mild scalloping of the neighboring cortex without visible soft tissue mass. The internal matrix appears ground-glass density. No cystic change or definite enhancing component. These are stable from prior. Mild torus mandibularis.  Upper chest: Prominent but stable appearance of thymic tissue, without discrete mass lesion  IMPRESSION: 1. Tonsillitis and adenitis without abscess. 2. Incidental 2 sclerotic right mandible  lesions, favor ossifying fibromas.   Electronically Signed   By: Monte Fantasia M.D.   On: 03/17/2014 23:51     EKG Interpretation None      MDM   Final diagnoses:  Tonsillitis  Adenitis  Fibroma of bone   Pt is a 29yo female presenting to ED with worsening throat pain. Pt does have muffled voice, trismus and significant bilateral tonsillar erythema and edema.  Uvula is midline.  Discussed pt with Dr. Betsey Holiday who also examined pt, doubt abscess due to uvula being midline, however, due to duration of sore throat.   Mono screen: negative.  Rapid strep: negative. Leukocytosis present.    Pt given IV fluids, morphine, decadron 10mg  in ED.  Minimal relief.   CT soft tissue neck: tonsillitis and adenitis w/o abscess. Incidental 2 sclerotic right mandible lesions, favor ossifying fibromas.  Pt able to keep down PO fluids.  Will discharge pt home, advised to continue taking antibiotics as prescribed. Rx: hycet. Encouraged fluids.  Advised to call Dr. Erik Obey, ENT, for further  evaluation and treatment of persistent sore throat. Return precautions provided. Pt verbalized understanding and agreement with tx plan.  Discussed pt with Dr. Betsey Holiday throughout pt's visit. He also examined labs and imaging, agrees with assessment and tx plan.  I personally performed the services described in this documentation, which was scribed in my presence. The recorded information has been reviewed and is accurate.     Noland Fordyce, PA-C 03/18/14 [id number]  Orpah Greek, MD 03/18/14 [phone]

## 2014-03-18 MED ORDER — HYDROCODONE-ACETAMINOPHEN 7.5-325 MG/15ML PO SOLN: 15.0000 mL | Freq: Four times a day (QID) | ORAL | Status: DC | PRN

## 2014-03-18 NOTE — Discharge Instructions (Signed)
Be sure to continue taking your antibiotics, penicillin, as prescribed as well as pain medication as prescribed. Call Dr. Erik Obey, ear nose and throat doctor on Monday, for further evaluation and treatment of persistent throat pain and swelling.  You were also found to have a fibroma on the right bottom side of your jaw in your mandible.  This finding is likely benign (non-cancerous).  You should follow up with your primary care provider as well as ENT for further evaluation and monitoring as needed.

## 2014-03-20 LAB — CULTURE, GROUP A STREP: Strep A Culture: NEGATIVE

## 2014-06-11 ENCOUNTER — Emergency Department (HOSPITAL_COMMUNITY): Payer: Medicaid Other

## 2014-06-11 ENCOUNTER — Encounter (HOSPITAL_COMMUNITY): Payer: Self-pay | Admitting: Nurse Practitioner

## 2014-06-11 ENCOUNTER — Emergency Department (HOSPITAL_COMMUNITY)
Admission: EM | Admit: 2014-06-11 | Discharge: 2014-06-11 | Disposition: A | Discharge status: Home / Self Care | Payer: Medicaid Other | Attending: Emergency Medicine | Admitting: Emergency Medicine

## 2014-06-11 DIAGNOSIS — Z87440 Personal history of urinary (tract) infections: Secondary | ICD-10-CM | POA: Diagnosis not present

## 2014-06-11 DIAGNOSIS — Z862 Personal history of diseases of the blood and blood-forming organs and certain disorders involving the immune mechanism: Secondary | ICD-10-CM | POA: Diagnosis not present

## 2014-06-11 DIAGNOSIS — Z720 Tobacco use: Secondary | ICD-10-CM | POA: Diagnosis not present

## 2014-06-11 DIAGNOSIS — Z8719 Personal history of other diseases of the digestive system: Secondary | ICD-10-CM | POA: Insufficient documentation

## 2014-06-11 DIAGNOSIS — Z8619 Personal history of other infectious and parasitic diseases: Secondary | ICD-10-CM | POA: Insufficient documentation

## 2014-06-11 DIAGNOSIS — M25531 Pain in right wrist: Secondary | ICD-10-CM | POA: Diagnosis present

## 2014-06-11 MED ORDER — IBUPROFEN 600 MG PO TABS: 600.0000 mg | ORAL_TABLET | Freq: Four times a day (QID) | ORAL | Status: DC | PRN

## 2014-06-11 NOTE — ED Notes (Signed)
Called several times no answer in waiting

## 2014-06-11 NOTE — ED Provider Notes (Signed)
CSN: [id number]     Arrival date & time 06/11/14  1719 History   First MD Initiated Contact with Patient 06/11/14 1800     Chief Complaint  Patient presents with  . Hand Pain     (Consider location/radiation/quality/duration/timing/severity/associated sxs/prior Treatment) Patient is a 29 y.o. female presenting with hand pain. The history is provided by the patient. No language interpreter was used.  Hand Pain This is a chronic problem. The current episode started more than 1 month ago. The problem occurs constantly. The problem has been gradually worsening. Pertinent negatives include no joint swelling or myalgias. Nothing aggravates the symptoms. She has tried nothing for the symptoms. The treatment provided moderate relief.    Past Medical History  Diagnosis Date  . Abnormal Pap smear   . Urinary tract infection   . Gastritis   . Anemia   . Hx of chlamydia infection   . Vaginal Pap smear, abnormal    Past Surgical History  Procedure Laterality Date  . Leep    . Leep    . Hernia repair      As an infant   Family History  Problem Relation Age of Onset  . Hypertension Mother   . Fibromyalgia Mother   . Cirrhosis Father   . Kidney disease Father   . Asthma Brother   . Birth defects Son     hirschprungs   History  Substance Use Topics  . Smoking status: Current Every Day Smoker -- 0.25 packs/day for 14 years    Types: Cigarettes  . Smokeless tobacco: Never Used  . Alcohol Use: 0.0 oz/week     Comment: Not since June 2014   OB History    Gravida Para Term Preterm AB TAB SAB Ectopic Multiple Living   3 3 3  0 0 0 0 0 0 3     Review of Systems  Musculoskeletal: Negative for myalgias and joint swelling.  All other systems reviewed and are negative.     Allergies  Tramadol  Home Medications   Prior to Admission medications   Medication Sig Start Date End Date Taking? Authorizing Provider  HYDROcodone-acetaminophen (HYCET) 7.5-325 mg/15 ml solution Take 15  mLs by mouth 4 (four) times daily as needed for moderate pain or severe pain. 03/18/14 03/18/15  Noland Fordyce, PA-C  ibuprofen (ADVIL,MOTRIN) 600 MG tablet Take 600 mg by mouth every 6 (six) hours as needed for moderate pain.    Historical Provider, MD  ketorolac (TORADOL) 10 MG tablet Take 1 tablet (10 mg total) by mouth every 6 (six) hours as needed. 07/31/13   Claudean Severance, MD  medroxyPROGESTERone (DEPO-PROVERA) 150 MG/ML injection Inject 150 mg into the muscle every 3 (three) months.    Historical Provider, MD  Prenatal Vit-Fe Fumarate-FA (PRENATAL MULTIVITAMIN) TABS tablet Take 1 tablet by mouth daily at 12 noon.    Historical Provider, MD   BP 131/68 mmHg  Pulse 78  Temp(Src) 98.4 F (36.9 C) (Oral)  Resp 18  SpO2 98% Physical Exam  Constitutional: She is oriented to person, place, and time. She appears well-developed and well-nourished.  Musculoskeletal: She exhibits tenderness.  Tender right wrist,  From with pain,  nv and ns intact positive phalen's  Neurological: She is alert and oriented to person, place, and time. She has normal reflexes.  Skin: Skin is warm.  Psychiatric: She has a normal mood and affect.  Nursing note and vitals reviewed.   ED Course  Procedures (including critical care time) Labs Review Labs  Reviewed - No data to display  Imaging Review Dg Wrist Complete Right  06/11/2014   CLINICAL DATA:  Right hand pain/ numbness x1 month  EXAM: RIGHT WRIST - COMPLETE 3+ VIEW  COMPARISON:  None.  FINDINGS: No fracture or dislocation is seen.  The joint spaces are preserved.  Visualized soft tissues are within normal limits.  IMPRESSION: No fracture or dislocation is seen.   Electronically Signed   By: Julian Hy M.D.   On: 06/11/2014 19:09     EKG Interpretation None      MDM   Pt placed in a wrist splint,  Rx for ibuprofen I advised follow up with Dr. Amedeo Plenty for evaluation.      Final diagnoses:  Wrist pain, right    Wrist splint Ibuprofen Follow up  with Dr. Amedeo Plenty for evaluation.     Hollace Kinnier [place], PA-C 06/11/14 [place], MD 06/12/14 0111

## 2014-06-11 NOTE — ED Notes (Signed)
She c/o R hand pain and numbness constant x 1 month. She has tried no medications for the pain. Symptoms are worse while she is washing dishes and cleaning. CMS intact.

## 2014-11-10 ENCOUNTER — Encounter (HOSPITAL_COMMUNITY): Payer: Self-pay | Admitting: Emergency Medicine

## 2014-11-10 ENCOUNTER — Emergency Department (HOSPITAL_COMMUNITY)
Admission: EM | Admit: 2014-11-10 | Discharge: 2014-11-11 | Disposition: A | Discharge status: Home / Self Care | Payer: Medicaid Other | Attending: Emergency Medicine | Admitting: Emergency Medicine

## 2014-11-10 DIAGNOSIS — R1110 Vomiting, unspecified: Secondary | ICD-10-CM | POA: Diagnosis present

## 2014-11-10 DIAGNOSIS — Z862 Personal history of diseases of the blood and blood-forming organs and certain disorders involving the immune mechanism: Secondary | ICD-10-CM | POA: Insufficient documentation

## 2014-11-10 DIAGNOSIS — R1084 Generalized abdominal pain: Secondary | ICD-10-CM | POA: Diagnosis not present

## 2014-11-10 DIAGNOSIS — Z8619 Personal history of other infectious and parasitic diseases: Secondary | ICD-10-CM | POA: Insufficient documentation

## 2014-11-10 DIAGNOSIS — R05 Cough: Secondary | ICD-10-CM | POA: Diagnosis not present

## 2014-11-10 DIAGNOSIS — Z3202 Encounter for pregnancy test, result negative: Secondary | ICD-10-CM | POA: Diagnosis not present

## 2014-11-10 DIAGNOSIS — R197 Diarrhea, unspecified: Secondary | ICD-10-CM | POA: Insufficient documentation

## 2014-11-10 DIAGNOSIS — F17210 Nicotine dependence, cigarettes, uncomplicated: Secondary | ICD-10-CM | POA: Insufficient documentation

## 2014-11-10 DIAGNOSIS — Z8719 Personal history of other diseases of the digestive system: Secondary | ICD-10-CM | POA: Diagnosis not present

## 2014-11-10 DIAGNOSIS — Z87440 Personal history of urinary (tract) infections: Secondary | ICD-10-CM | POA: Insufficient documentation

## 2014-11-10 DIAGNOSIS — R6883 Chills (without fever): Secondary | ICD-10-CM | POA: Diagnosis not present

## 2014-11-10 LAB — CBC
HEMATOCRIT: 43.3 % (ref 36.0–46.0)
HEMOGLOBIN: 14.3 g/dL (ref 12.0–15.0)
MCH: 31.6 pg (ref 26.0–34.0)
MCHC: 33 g/dL (ref 30.0–36.0)
MCV: 95.8 fL (ref 78.0–100.0)
Platelets: 236 10*3/uL (ref 150–400)
RBC: 4.52 MIL/uL (ref 3.87–5.11)
RDW: 12.9 % (ref 11.5–15.5)
WBC: 9 10*3/uL (ref 4.0–10.5)

## 2014-11-10 LAB — COMPREHENSIVE METABOLIC PANEL
ALBUMIN: 3.9 g/dL (ref 3.5–5.0)
ALK PHOS: 84 U/L (ref 38–126)
ALT: 14 U/L (ref 14–54)
ANION GAP: 9 (ref 5–15)
AST: 24 U/L (ref 15–41)
BILIRUBIN TOTAL: 0.8 mg/dL (ref 0.3–1.2)
BUN: 8 mg/dL (ref 6–20)
CALCIUM: 8.9 mg/dL (ref 8.9–10.3)
CO2: 20 mmol/L — AB (ref 22–32)
Chloride: 104 mmol/L (ref 101–111)
Creatinine, Ser: 0.72 mg/dL (ref 0.44–1.00)
GFR calc Af Amer: >60 mL/min (ref >60)
GFR calc non Af Amer: >60 mL/min (ref >60)
Glucose, Bld: 104 mg/dL — ABNORMAL HIGH (ref 65–99)
POTASSIUM: 3.7 mmol/L (ref 3.5–5.1)
SODIUM: 133 mmol/L — AB (ref 135–145)
TOTAL PROTEIN: 7.2 g/dL (ref 6.5–8.1)

## 2014-11-10 LAB — LIPASE, BLOOD: Lipase: 24 U/L (ref 11–51)

## 2014-11-10 LAB — I-STAT BETA HCG BLOOD, ED (MC, WL, AP ONLY): I-stat hCG, quantitative: <5 m[IU]/mL (ref <5)

## 2014-11-10 MED ORDER — ONDANSETRON 4 MG PO TBDP
ORAL_TABLET | ORAL | Status: AC
  Filled 2014-11-10: qty 1

## 2014-11-10 MED ORDER — MORPHINE SULFATE (PF) 2 MG/ML IV SOLN
2.0000 mg | Freq: Once | INTRAVENOUS | Status: AC
  Administered 2014-11-10: 2 mg via INTRAVENOUS
  Filled 2014-11-10: qty 1

## 2014-11-10 MED ORDER — SODIUM CHLORIDE 0.9 % IV BOLUS (SEPSIS)
1000.0000 mL | Freq: Once | INTRAVENOUS | Status: AC
  Administered 2014-11-10: 1000 mL via INTRAVENOUS

## 2014-11-10 MED ORDER — ONDANSETRON 4 MG PO TBDP
4.0000 mg | ORAL_TABLET | Freq: Once | ORAL | Status: AC | PRN
  Administered 2014-11-10: 4 mg via ORAL

## 2014-11-10 MED ORDER — ONDANSETRON HCL 4 MG/2ML IJ SOLN
4.0000 mg | Freq: Once | INTRAMUSCULAR | Status: AC
  Administered 2014-11-10: 4 mg via INTRAVENOUS
  Filled 2014-11-10: qty 2

## 2014-11-10 NOTE — ED Notes (Signed)
Patient here with complaint of nausea, vomiting, diarrhea, and abdominal pain. States onset today. Child at home has had similar illness for past couple days per patient. Denies fever, normothermic in triage. Actively vomiting in triage.

## 2014-11-10 NOTE — ED Provider Notes (Signed)
CSN: [id number]     Arrival date & time 11/10/14  2050 History   First MD Initiated Contact with Patient 11/10/14 2252     Chief Complaint  Patient presents with  . Emesis  . Abdominal Pain  . Diarrhea     Patient is a 29 y.o. female presenting with vomiting, abdominal pain, and diarrhea. The history is provided by the patient.  Emesis Severity:  Moderate Timing:  Intermittent Progression:  Worsening Chronicity:  New Relieved by:  Nothing Worsened by:  Liquids Associated symptoms: abdominal pain, chills, cough, diarrhea and myalgias   Risk factors: sick contacts   Risk factors: no travel to endemic areas   Abdominal Pain Associated symptoms: chills, diarrhea and vomiting   Diarrhea Associated symptoms: abdominal pain, chills, cough, myalgias and vomiting   Pt reports onset of nausea/vomiting/diarrhea about 7 hours ago She denies blood in vomit/stool She reports diffuse abdominal pain She reports mild cough   Past Medical History  Diagnosis Date  . Abnormal Pap smear   . Urinary tract infection   . Gastritis   . Anemia   . Hx of chlamydia infection   . Vaginal Pap smear, abnormal    Past Surgical History  Procedure Laterality Date  . Leep    . Leep    . Hernia repair      As an infant   Family History  Problem Relation Age of Onset  . Hypertension Mother   . Fibromyalgia Mother   . Cirrhosis Father   . Kidney disease Father   . Asthma Brother   . Birth defects Son     hirschprungs   Social History  Substance Use Topics  . Smoking status: Current Every Day Smoker -- 0.25 packs/day for 14 years    Types: Cigarettes  . Smokeless tobacco: Never Used  . Alcohol Use: 0.0 oz/week     Comment: Not since June 2014   OB History    Gravida Para Term Preterm AB TAB SAB Ectopic Multiple Living   3 3 3  0 0 0 0 0 0 3     Review of Systems  Constitutional: Positive for chills.  Gastrointestinal: Positive for vomiting, abdominal pain and diarrhea. Negative for  blood in stool.  Musculoskeletal: Positive for myalgias.  All other systems reviewed and are negative.     Allergies  Tramadol  Home Medications   Prior to Admission medications   Medication Sig Start Date End Date Taking? Authorizing Provider  medroxyPROGESTERone (DEPO-PROVERA) 150 MG/ML injection Inject 150 mg into the muscle every 3 (three) months.   Yes Historical Provider, MD   BP 133/75 mmHg  Pulse 88  Temp(Src) 98.2 F (36.8 C) (Oral)  Resp 18  SpO2 98% Physical Exam CONSTITUTIONAL: Well developed/well nourished HEAD: Normocephalic/atraumatic EYES: EOMI/PERRL ENMT: Mucous membranes dry NECK: supple no meningeal signs SPINE/BACK:entire spine nontender CV: S1/S2 noted, no murmurs/rubs/gallops noted LUNGS: Lungs are clear to auscultation bilaterally, no apparent distress ABDOMEN: soft, diffuse mild tenderness noted, no rebound or guarding, bowel sounds noted throughout abdomen GU:no cva tenderness NEURO: Pt is awake/alert/appropriate, moves all extremitiesx4.  No facial droop.   EXTREMITIES: pulses normal/equal, full ROM SKIN: warm, color normal PSYCH: no abnormalities of mood noted, alert and oriented to situation  ED Course  Procedures  11:30 PM Pt is dehydrated with vomiting/diarrhea.  Suspect gastroenteritis Will rehydrate and reassess Pt is dry heaving in room on my assessment 11:47 PM Pt now receiving meds/fluids Plan at signout to dr Dina Rich is f/u on  response to fluids/meds If taking PO fluids and pain improved, can be discharged   Labs Review Labs Reviewed  COMPREHENSIVE METABOLIC PANEL - Abnormal; Notable for the following:    Sodium 133 (*)    CO2 20 (*)    Glucose, Bld 104 (*)    All other components within normal limits  LIPASE, BLOOD  CBC  I-STAT BETA HCG BLOOD, ED ([facility], [facility], [facility] ONLY)    I have personally reviewed and evaluated these lab results as part of my medical decision-making.   Medications  ondansetron (ZOFRAN-ODT)  disintegrating tablet 4 mg (4 mg Oral Given 11/10/14 2112)  ondansetron (ZOFRAN) injection 4 mg (4 mg Intravenous Given 11/10/14 2346)  morphine 2 MG/ML injection 2 mg (2 mg Intravenous Given 11/10/14 2346)  sodium chloride 0.9 % bolus 1,000 mL (1,000 mLs Intravenous New Bag/Given 11/10/14 2346)    MDM   Final diagnoses:  Vomiting and diarrhea    Nursing notes including past medical history and social history reviewed and considered in documentation Labs/vital reviewed myself and considered during evaluation     Ripley Fraise, MD 11/10/14 2347

## 2014-11-10 NOTE — Discharge Instructions (Signed)

## 2014-11-11 MED ORDER — ONDANSETRON 4 MG PO TBDP: 4.0000 mg | ORAL_TABLET | Freq: Three times a day (TID) | ORAL | Status: DC | PRN

## 2014-11-11 NOTE — ED Provider Notes (Signed)
Patient signed out by Dr. Christy Gentles. Lab work reviewed and reassuring. Patient reports that she feels much better after treatment. Exam is benign. Patient able to tolerate fluids. At discharge, patient is requesting a work note and referral to an orthopedist for an unrelated rotator cuff injury. These were provided.  After history, exam, and medical workup I feel the patient has been appropriately medically screened and is safe for discharge home. Pertinent diagnoses were discussed with the patient. Patient was given return precautions.  Merryl Hacker, MD 11/11/14 [phone]

## 2014-11-19 ENCOUNTER — Encounter (HOSPITAL_COMMUNITY): Payer: Self-pay

## 2014-11-19 ENCOUNTER — Inpatient Hospital Stay (HOSPITAL_COMMUNITY)
Admission: AD | Admit: 2014-11-19 | Discharge: 2014-11-20 | Disposition: A | Discharge status: Home / Self Care | Payer: Medicaid Other | Source: Ambulatory Visit | Attending: Family Medicine | Admitting: Family Medicine

## 2014-11-19 DIAGNOSIS — N760 Acute vaginitis: Secondary | ICD-10-CM | POA: Insufficient documentation

## 2014-11-19 DIAGNOSIS — A499 Bacterial infection, unspecified: Secondary | ICD-10-CM | POA: Diagnosis not present

## 2014-11-19 DIAGNOSIS — Z3202 Encounter for pregnancy test, result negative: Secondary | ICD-10-CM | POA: Diagnosis not present

## 2014-11-19 DIAGNOSIS — N898 Other specified noninflammatory disorders of vagina: Secondary | ICD-10-CM | POA: Diagnosis present

## 2014-11-19 DIAGNOSIS — F17200 Nicotine dependence, unspecified, uncomplicated: Secondary | ICD-10-CM | POA: Insufficient documentation

## 2014-11-19 DIAGNOSIS — B9689 Other specified bacterial agents as the cause of diseases classified elsewhere: Secondary | ICD-10-CM

## 2014-11-19 LAB — POCT PREGNANCY, URINE: Preg Test, Ur: NEGATIVE

## 2014-11-19 NOTE — MAU Note (Signed)
Pt c/o stomach pain for a few days-sharp that comes and goes. Has a white/yellow discharge-irritating and has an odor. Got last depo shot last week.

## 2014-11-20 DIAGNOSIS — A499 Bacterial infection, unspecified: Secondary | ICD-10-CM | POA: Diagnosis not present

## 2014-11-20 DIAGNOSIS — N760 Acute vaginitis: Secondary | ICD-10-CM

## 2014-11-20 LAB — URINALYSIS, ROUTINE W REFLEX MICROSCOPIC
BILIRUBIN URINE: NEGATIVE
Glucose, UA: NEGATIVE mg/dL
KETONES UR: NEGATIVE mg/dL
NITRITE: NEGATIVE
PH: 7 (ref 5.0–8.0)
Protein, ur: NEGATIVE mg/dL
SPECIFIC GRAVITY, URINE: 1.015 (ref 1.005–1.030)
UROBILINOGEN UA: 0.2 mg/dL (ref 0.0–1.0)

## 2014-11-20 LAB — WET PREP, GENITAL
TRICH WET PREP: NONE SEEN
YEAST WET PREP: NONE SEEN

## 2014-11-20 LAB — URINE MICROSCOPIC-ADD ON

## 2014-11-20 MED ORDER — METRONIDAZOLE 500 MG PO TABS: 500.0000 mg | ORAL_TABLET | Freq: Two times a day (BID) | ORAL | Status: DC

## 2014-11-20 NOTE — Discharge Instructions (Signed)

## 2014-11-20 NOTE — MAU Provider Note (Signed)
History     CSN: [id number]  Arrival date and time: 11/19/14 2242   First Provider Initiated Contact with Patient 11/20/14 0008      No chief complaint on file.  Vaginal Discharge The patient's primary symptoms include a genital odor and vaginal discharge. The patient's pertinent negatives include no genital itching. This is a new (about one week ago. ) problem. The current episode started in the past 7 days. The problem occurs constantly. The problem has been unchanged. Pain severity now: 8/10. The problem affects both sides. Associated symptoms include abdominal pain. Pertinent negatives include no chills, constipation, diarrhea, dysuria, fever, frequency, nausea, urgency or vomiting. The vaginal discharge was yellow. There has been no bleeding. She has not been passing clots. She has not been passing tissue. Nothing aggravates the symptoms. She has tried nothing for the symptoms. She is sexually active. It is unknown whether or not her partner has an STD. She uses progestin injections for contraception.   Past Medical History  Diagnosis Date  . Abnormal Pap smear   . Urinary tract infection   . Gastritis   . Anemia   . Hx of chlamydia infection   . Vaginal Pap smear, abnormal     Past Surgical History  Procedure Laterality Date  . Leep    . Leep    . Hernia repair      As an infant    Family History  Problem Relation Age of Onset  . Hypertension Mother   . Fibromyalgia Mother   . Cirrhosis Father   . Kidney disease Father   . Asthma Brother   . Birth defects Son     hirschprungs    Social History  Substance Use Topics  . Smoking status: Current Every Day Smoker -- 0.25 packs/day for 14 years    Types: Cigarettes  . Smokeless tobacco: Never Used  . Alcohol Use: 0.0 oz/week     Comment: Not since June 2014    Allergies:  Allergies  Allergen Reactions  . Tramadol Nausea And Vomiting    Prescriptions prior to admission  Medication Sig Dispense Refill Last  Dose  . medroxyPROGESTERone (DEPO-PROVERA) 150 MG/ML injection Inject 150 mg into the muscle every 3 (three) months.   Past Week at Unknown time  . ondansetron (ZOFRAN ODT) 4 MG disintegrating tablet Take 1 tablet (4 mg total) by mouth every 8 (eight) hours as needed for nausea or vomiting. 20 tablet 0 Past Month at Unknown time  . ranitidine (ZANTAC) 150 MG tablet Take 150 mg by mouth 2 (two) times daily.   11/19/2014 at Unknown time    Review of Systems  Constitutional: Negative for fever and chills.  Gastrointestinal: Positive for abdominal pain. Negative for nausea, vomiting, diarrhea and constipation.  Genitourinary: Positive for vaginal discharge. Negative for dysuria, urgency and frequency.   Physical Exam   Blood pressure 147/88, pulse 78, temperature 98.3 F (36.8 C), temperature source Oral, resp. rate 18, height 5\' 6"  (1.676 m), weight 102.694 kg (226 lb 6.4 oz), SpO2 99 %, not currently breastfeeding.  Physical Exam  Nursing note and vitals reviewed. Constitutional: She appears well-developed and well-nourished. No distress.  HENT:  Head: Normocephalic.  Cardiovascular: Normal rate.   Respiratory: Effort normal.  GI: Soft. There is no tenderness. There is no rebound.  Genitourinary:   External: no lesion Vagina: small amount of white discharge Cervix: pink, smooth, no CMT Uterus: NSSC Adnexa: NT   Neurological: She is alert.  Skin: Skin is warm  and dry.  Psychiatric: She has a normal mood and affect.   Results for orders placed or performed during the hospital encounter of 11/19/14 (from the past 24 hour(s))  Urinalysis, Routine w reflex microscopic (not at [facility])     Status: Abnormal   Collection Time: 11/19/14 11:15 PM  Result Value Ref Range   Color, Urine YELLOW YELLOW   APPearance CLEAR CLEAR   Specific Gravity, Urine 1.015 1.005 - 1.030   pH 7.0 5.0 - 8.0   Glucose, UA NEGATIVE NEGATIVE mg/dL   Hgb urine dipstick TRACE (A) NEGATIVE   Bilirubin Urine  NEGATIVE NEGATIVE   Ketones, ur NEGATIVE NEGATIVE mg/dL   Protein, ur NEGATIVE NEGATIVE mg/dL   Urobilinogen, UA 0.2 0.0 - 1.0 mg/dL   Nitrite NEGATIVE NEGATIVE   Leukocytes, UA TRACE (A) NEGATIVE  Urine microscopic-add on     Status: Abnormal   Collection Time: 11/19/14 11:15 PM  Result Value Ref Range   Squamous Epithelial / LPF MANY (A) RARE   WBC, UA 3-6 <3 WBC/hpf   RBC / HPF 0-2 <3 RBC/hpf   Bacteria, UA FEW (A) RARE  Pregnancy, urine POC     Status: None   Collection Time: 11/19/14 11:18 PM  Result Value Ref Range   Preg Test, Ur NEGATIVE NEGATIVE  Wet prep, genital     Status: Abnormal   Collection Time: 11/20/14 12:51 AM  Result Value Ref Range   Yeast Wet Prep HPF POC NONE SEEN NONE SEEN   Trich, Wet Prep NONE SEEN NONE SEEN   Clue Cells Wet Prep HPF POC FEW (A) NONE SEEN   WBC, Wet Prep HPF POC FEW (A) NONE SEEN    MAU Course  Procedures  MDM   Assessment and Plan   1. Bacterial vaginal infection    DC home Comfort measures reviewed  RX: flagyl 500 mg BUD x 7 days #14 Return to MAU as needed   Follow-up Information    Follow up with [facility].   Why:  As scheduled   Contact information:   [place] [address] [phone]      ]   Mathis Bud 11/20/2014, 12:09 AM

## 2014-11-21 LAB — GC/CHLAMYDIA PROBE AMP (~~LOC~~) NOT AT ARMC
Chlamydia: NEGATIVE
Neisseria Gonorrhea: NEGATIVE

## 2014-12-29 ENCOUNTER — Emergency Department (HOSPITAL_COMMUNITY): Payer: No Typology Code available for payment source

## 2014-12-29 ENCOUNTER — Emergency Department (HOSPITAL_COMMUNITY)
Admission: EM | Admit: 2014-12-29 | Discharge: 2014-12-29 | Disposition: A | Discharge status: Home / Self Care | Payer: No Typology Code available for payment source | Attending: Emergency Medicine | Admitting: Emergency Medicine

## 2014-12-29 ENCOUNTER — Encounter (HOSPITAL_COMMUNITY): Payer: Self-pay | Admitting: *Deleted

## 2014-12-29 DIAGNOSIS — Z8619 Personal history of other infectious and parasitic diseases: Secondary | ICD-10-CM | POA: Insufficient documentation

## 2014-12-29 DIAGNOSIS — Y999 Unspecified external cause status: Secondary | ICD-10-CM | POA: Diagnosis not present

## 2014-12-29 DIAGNOSIS — S39012A Strain of muscle, fascia and tendon of lower back, initial encounter: Secondary | ICD-10-CM | POA: Diagnosis not present

## 2014-12-29 DIAGNOSIS — K2970 Gastritis, unspecified, without bleeding: Secondary | ICD-10-CM | POA: Insufficient documentation

## 2014-12-29 DIAGNOSIS — Z87440 Personal history of urinary (tract) infections: Secondary | ICD-10-CM | POA: Insufficient documentation

## 2014-12-29 DIAGNOSIS — S161XXA Strain of muscle, fascia and tendon at neck level, initial encounter: Secondary | ICD-10-CM | POA: Diagnosis not present

## 2014-12-29 DIAGNOSIS — F17210 Nicotine dependence, cigarettes, uncomplicated: Secondary | ICD-10-CM | POA: Insufficient documentation

## 2014-12-29 DIAGNOSIS — Z862 Personal history of diseases of the blood and blood-forming organs and certain disorders involving the immune mechanism: Secondary | ICD-10-CM | POA: Insufficient documentation

## 2014-12-29 DIAGNOSIS — Z793 Long term (current) use of hormonal contraceptives: Secondary | ICD-10-CM | POA: Diagnosis not present

## 2014-12-29 DIAGNOSIS — Y9389 Activity, other specified: Secondary | ICD-10-CM | POA: Insufficient documentation

## 2014-12-29 DIAGNOSIS — Z792 Long term (current) use of antibiotics: Secondary | ICD-10-CM | POA: Diagnosis not present

## 2014-12-29 DIAGNOSIS — Y92410 Unspecified street and highway as the place of occurrence of the external cause: Secondary | ICD-10-CM | POA: Insufficient documentation

## 2014-12-29 DIAGNOSIS — S199XXA Unspecified injury of neck, initial encounter: Secondary | ICD-10-CM | POA: Diagnosis present

## 2014-12-29 DIAGNOSIS — Z79899 Other long term (current) drug therapy: Secondary | ICD-10-CM | POA: Insufficient documentation

## 2014-12-29 MED ORDER — IBUPROFEN 800 MG PO TABS: 800.0000 mg | ORAL_TABLET | Freq: Three times a day (TID) | ORAL | Status: DC | PRN

## 2014-12-29 MED ORDER — HYDROCODONE-ACETAMINOPHEN 5-325 MG PO TABS: 1.0000 | ORAL_TABLET | Freq: Four times a day (QID) | ORAL | Status: DC | PRN

## 2014-12-29 MED ORDER — IBUPROFEN 200 MG PO TABS
600.0000 mg | ORAL_TABLET | Freq: Once | ORAL | Status: AC
  Administered 2014-12-29: 600 mg via ORAL
  Filled 2014-12-29: qty 1
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
## 2014-12-29 NOTE — Discharge Instructions (Signed)
Return here as needed.  Follow-up with a primary care doctor.  Use ice and heat on the areas that are sore.  The x-rays did not show any abnormality

## 2015-06-25 ENCOUNTER — Encounter (HOSPITAL_COMMUNITY): Payer: Self-pay | Admitting: *Deleted

## 2015-06-25 ENCOUNTER — Inpatient Hospital Stay (HOSPITAL_COMMUNITY)
Admission: AD | Admit: 2015-06-25 | Discharge: 2015-06-25 | Disposition: A | Discharge status: Home / Self Care | Payer: Medicaid Other | Source: Ambulatory Visit | Attending: Obstetrics and Gynecology | Admitting: Obstetrics and Gynecology

## 2015-06-25 DIAGNOSIS — R300 Dysuria: Secondary | ICD-10-CM

## 2015-06-25 DIAGNOSIS — N760 Acute vaginitis: Secondary | ICD-10-CM | POA: Insufficient documentation

## 2015-06-25 DIAGNOSIS — F17210 Nicotine dependence, cigarettes, uncomplicated: Secondary | ICD-10-CM | POA: Diagnosis not present

## 2015-06-25 DIAGNOSIS — Z3202 Encounter for pregnancy test, result negative: Secondary | ICD-10-CM | POA: Diagnosis not present

## 2015-06-25 DIAGNOSIS — N898 Other specified noninflammatory disorders of vagina: Secondary | ICD-10-CM | POA: Diagnosis present

## 2015-06-25 LAB — WET PREP, GENITAL
Clue Cells Wet Prep HPF POC: NONE SEEN
SPERM: NONE SEEN
TRICH WET PREP: NONE SEEN
Yeast Wet Prep HPF POC: NONE SEEN

## 2015-06-25 LAB — URINALYSIS, ROUTINE W REFLEX MICROSCOPIC
BILIRUBIN URINE: NEGATIVE
Glucose, UA: NEGATIVE mg/dL
Ketones, ur: NEGATIVE mg/dL
NITRITE: NEGATIVE
PH: 7 (ref 5.0–8.0)
Protein, ur: NEGATIVE mg/dL
SPECIFIC GRAVITY, URINE: 1.02 (ref 1.005–1.030)

## 2015-06-25 LAB — URINE MICROSCOPIC-ADD ON

## 2015-06-25 LAB — POCT PREGNANCY, URINE: PREG TEST UR: NEGATIVE

## 2015-06-25 MED ORDER — NITROFURANTOIN MONOHYD MACRO 100 MG PO CAPS: 100.0000 mg | ORAL_CAPSULE | Freq: Two times a day (BID) | ORAL | Status: DC

## 2015-06-25 MED ORDER — TERCONAZOLE 0.4 % VA CREA: TOPICAL_CREAM | VAGINAL | Status: DC

## 2015-06-25 NOTE — Discharge Instructions (Signed)

## 2015-06-25 NOTE — MAU Provider Note (Signed)
History   [id number]   Chief Complaint  Patient presents with  . Abdominal Pain    HPI Dawn Brock is a 30 y.o. female  [phone] here with report of vaginal discharge and dysuria x 4 days.  Also reports lower pelvic pressure when urinating.  Vaginal area feels "irritated" and burns.  No report of back pain.   Patient's last menstrual period was 04/07/2015.  OB History  Gravida Para Term Preterm AB SAB TAB Ectopic Multiple Living  3 3 3  0 0 0 0 0 0 3    # Outcome Date GA Lbr Len/2nd Weight Sex Delivery Anes PTL Lv  3 Term 02/22/13 [redacted]w[redacted]d 10:29 / 00:57 8 lb 2 oz (3.685 kg) [address] EPI  Y  2 Term 2008 [redacted]w[redacted]d 10:00 8 lb 6 oz (3.799 kg) M Vag-Spont EPI  Y     Comments: son has Hirschprungs Disease  1 Term 2006 [redacted]w[redacted]d 07:00 6 lb 8 oz (2.948 kg) F Vag-Spont EPI  Y      Past Medical History  Diagnosis Date  . Abnormal Pap smear   . Urinary tract infection   . Gastritis   . Anemia   . Hx of chlamydia infection   . Vaginal Pap smear, abnormal     Family History  Problem Relation Age of Onset  . Hypertension Mother   . Fibromyalgia Mother   . Cirrhosis Father   . Kidney disease Father   . Asthma Brother   . Birth defects Son     hirschprungs    Social History   Social History  . Marital Status: Single    Spouse Name: N/A  . Number of Children: N/A  . Years of Education: N/A   Social History Main Topics  . Smoking status: Current Every Day Smoker -- 0.25 packs/day for 14 years    Types: Cigarettes  . Smokeless tobacco: Never Used  . Alcohol Use: 0.0 oz/week     Comment: Not since June 2014  . Drug Use: Yes    Special: Cocaine, Marijuana     Comment: Patient denies any recent use of [place] and cocaine as of 07/02/12    LAST SMOKED  MARIJUANA-   LAST WEEK LAST USED COCAINE-    2015  . Sexual Activity: Yes    Birth Control/ Protection: Injection     Comment: Missed Depo shot in July   Other Topics Concern  . None   Social History Narrative     Allergies  Allergen Reactions  . Tramadol Nausea And Vomiting    No current facility-administered medications on file prior to encounter.   Current Outpatient Prescriptions on File Prior to Encounter  Medication Sig Dispense Refill  . HYDROcodone-acetaminophen (NORCO/VICODIN) 5-325 MG tablet Take 1 tablet by mouth every 6 (six) hours as needed for moderate pain. (Patient not taking: Reported on 06/25/2015) 15 tablet 0  . ibuprofen (ADVIL,MOTRIN) 800 MG tablet Take 1 tablet (800 mg total) by mouth every 8 (eight) hours as needed. 21 tablet 0  . medroxyPROGESTERone (DEPO-PROVERA) 150 MG/ML injection Inject 150 mg into the muscle every 3 (three) months.    . metroNIDAZOLE (FLAGYL) 500 MG tablet Take 1 tablet (500 mg total) by mouth 2 (two) times daily. (Patient not taking: Reported on 06/25/2015) 14 tablet 0  . ondansetron (ZOFRAN ODT) 4 MG disintegrating tablet Take 1 tablet (4 mg total) by mouth every 8 (eight) hours as needed for nausea or vomiting. (Patient not taking: Reported on 06/25/2015) 20 tablet  0  . ranitidine (ZANTAC) 150 MG tablet Take 150 mg by mouth 2 (two) times daily.       Review of Systems  Constitutional: Negative for fever and chills.  Gastrointestinal: Negative for nausea, vomiting and abdominal pain.  Genitourinary: Positive for dysuria, frequency, vaginal pain and pelvic pain. Negative for hematuria, flank pain and vaginal bleeding.  All other systems reviewed and are negative.    Physical Exam   Filed Vitals:   06/25/15 0431  BP: 123/80  Pulse: 73  Temp: 98.4 F (36.9 C)  TempSrc: Oral  Resp: 20  Height: 5\' 7"  (1.702 m)  Weight: 215 lb 4 oz (97.637 kg)    Physical Exam  Constitutional: She is oriented to person, place, and time. She appears well-developed and well-nourished. No distress.  HENT:  Head: Normocephalic.  Neck: Normal range of motion. Neck supple.  Cardiovascular: Normal rate, regular rhythm and normal heart sounds.   Respiratory:  Effort normal and breath sounds normal. No respiratory distress.  GI: Soft. She exhibits no mass. There is no tenderness. There is no rebound, no guarding and no CVA tenderness.  Genitourinary: There is no lesion on the right labia. There is no lesion on the left labia. Cervix exhibits no motion tenderness and no discharge. No erythema or bleeding in the vagina. Vaginal discharge (white, creamy; + odor) found.  Musculoskeletal: Normal range of motion. She exhibits no edema.  Neurological: She is alert and oriented to person, place, and time.  Skin: Skin is warm and dry.  Psychiatric: She has a normal mood and affect.    MAU Course  Procedures  Results for orders placed or performed during the hospital encounter of 06/25/15 (from the past 24 hour(s))  Urinalysis, Routine w reflex microscopic (not at [facility])     Status: Abnormal   Collection Time: 06/25/15  4:35 AM  Result Value Ref Range   Color, Urine YELLOW YELLOW   APPearance HAZY (A) CLEAR   Specific Gravity, Urine 1.020 1.005 - 1.030   pH 7.0 5.0 - 8.0   Glucose, UA NEGATIVE NEGATIVE mg/dL   Hgb urine dipstick TRACE (A) NEGATIVE   Bilirubin Urine NEGATIVE NEGATIVE   Ketones, ur NEGATIVE NEGATIVE mg/dL   Protein, ur NEGATIVE NEGATIVE mg/dL   Nitrite NEGATIVE NEGATIVE   Leukocytes, UA TRACE (A) NEGATIVE  Wet prep, genital     Status: Abnormal   Collection Time: 06/25/15  4:35 AM  Result Value Ref Range   Yeast Wet Prep HPF POC NONE SEEN NONE SEEN   Trich, Wet Prep NONE SEEN NONE SEEN   Clue Cells Wet Prep HPF POC NONE SEEN NONE SEEN   WBC, Wet Prep HPF POC FEW (A) NONE SEEN   Sperm NONE SEEN   Urine microscopic-add on     Status: Abnormal   Collection Time: 06/25/15  4:35 AM  Result Value Ref Range   Squamous Epithelial / LPF 0-5 (A) NONE SEEN   WBC, UA 0-5 0 - 5 WBC/hpf   RBC / HPF 0-5 0 - 5 RBC/hpf   Bacteria, UA FEW (A) NONE SEEN  Pregnancy, urine POC     Status: None   Collection Time: 06/25/15  4:50 AM  Result Value  Ref Range   Preg Test, Ur NEGATIVE NEGATIVE    Assessment and Plan  Dysuria Vaginitis  Plan: Discharge home RX Macrobid 100 mg BID x 7 days  Urine culture sent to lab RX Terazol cream to apply to external labia   [place]  Michiel Cowboy, CNM 06/25/2015 5:47 AM

## 2015-06-25 NOTE — MAU Note (Addendum)
PT SAYS SHE HAS VAG D/C-   STARTED  Thursday-    WITH FOUL  ODOR   .   STARTED HAVING  ABD  PAIN - ON Monday.   NO BIRTH CONTROL.          STOPPED  DEPO   IN FEB        LAST SEX-   LAST WED    NO MEDS  FOR PAIN

## 2015-06-26 LAB — URINE CULTURE

## 2015-06-26 LAB — GC/CHLAMYDIA PROBE AMP (~~LOC~~) NOT AT ARMC
CHLAMYDIA, DNA PROBE: NEGATIVE
NEISSERIA GONORRHEA: NEGATIVE

## 2015-08-07 ENCOUNTER — Emergency Department (HOSPITAL_COMMUNITY)
Admission: EM | Admit: 2015-08-07 | Discharge: 2015-08-07 | Disposition: A | Discharge status: Home / Self Care | Payer: Medicaid Other | Attending: Emergency Medicine | Admitting: Emergency Medicine

## 2015-08-07 ENCOUNTER — Encounter (HOSPITAL_COMMUNITY): Payer: Self-pay

## 2015-08-07 DIAGNOSIS — H9191 Unspecified hearing loss, right ear: Secondary | ICD-10-CM | POA: Diagnosis not present

## 2015-08-07 DIAGNOSIS — F17210 Nicotine dependence, cigarettes, uncomplicated: Secondary | ICD-10-CM | POA: Insufficient documentation

## 2015-08-07 DIAGNOSIS — H938X1 Other specified disorders of right ear: Secondary | ICD-10-CM

## 2015-08-07 MED ORDER — AMOXICILLIN 500 MG PO CAPS: 500.0000 mg | ORAL_CAPSULE | Freq: Two times a day (BID) | ORAL | 0 refills | Status: DC

## 2015-08-07 MED ORDER — CETIRIZINE-PSEUDOEPHEDRINE ER 5-120 MG PO TB12: 1.0000 | ORAL_TABLET | Freq: Every day | ORAL | 0 refills | Status: DC

## 2015-08-07 NOTE — ED Triage Notes (Signed)
Patient here with right ear stopped up x 2 weeks. Denies pain, no cold symptoms

## 2015-08-07 NOTE — ED Provider Notes (Signed)
[facility] DEPT Provider Note   CSN: [id number] Arrival date & time: 08/07/15  1659  First Provider Contact:  5:50 PM   By signing my name below, I, Dawn Brock, attest that this documentation has been prepared under the direction and in the presence of [employer].  Electronically Signed: Ephriam Brock, ED Scribe. 08/07/15. 6:06 PM.   History   Chief Complaint No chief complaint on file.  HPI Comments: Dawn Brock is a 30 y.o. female who presents to the Emergency Department complaining of sudden onset, constant, loss of hearing in her right ear, onset eleven days ago. Pt states she was getting dressed the morning of 07/27/15 and reports that she had sudden onset of decreased hearing in her right ear. Pt reports constant hearing loss in the ear ever since. Pt denies any pain to the ear. Pt has taken OTC sinus medication with no relief. Pt also states she attempted to flush the ear with peroxide with no relief. Pt denies Hx of allergies. Pt denies any symptoms in her left ear. Pt denies any pain, dizziness, drainage from the ear, cough, sore throat.   The history is provided by the patient. No language interpreter was used.    Past Medical History:  Diagnosis Date  . Abnormal Pap smear   . Anemia   . Gastritis   . Hx of chlamydia infection   . Urinary tract infection   . Vaginal Pap smear, abnormal     Patient Active Problem List   Diagnosis Date Noted  . Cocaine abuse complicating pregnancy [id number]  . Post term pregnancy 02/21/2013  . Hyperemesis gravidarum with dehydration 09/30/2012  . Alcohol dependence ([place]) 03/12/2012  . Cannabis dependence ([place]) 03/12/2012  . Executive function deficit 03/11/2012    Class: Acute  . Cigarette smoker 03/10/2012  . Duodenitis with nausea, vomiting, diarrhea and abdominal pain 03/10/2012  . Alcohol abuse 03/10/2012  . Hypokalemia 01/26/2012  . UTI (lower urinary tract infection) 01/26/2012    Past Surgical  History:  Procedure Laterality Date  . HERNIA REPAIR     As an infant  . LEEP    . LEEP      OB History    Gravida Para Term Preterm AB Living   3 3 3  0 0 3   SAB TAB Ectopic Multiple Live Births   0 0 0 0         Home Medications    Prior to Admission medications   Medication Sig Start Date End Date Taking? Authorizing Provider  ibuprofen (ADVIL,MOTRIN) 800 MG tablet Take 1 tablet (800 mg total) by mouth every 8 (eight) hours as needed. 12/29/14   Dalia Heading, PA-C  nitrofurantoin, macrocrystal-monohydrate, (MACROBID) 100 MG capsule Take 1 capsule (100 mg total) by mouth 2 (two) times daily. 06/25/15   Gwen Pounds, CNM  terconazole (TERAZOL 7) 0.4 % vaginal cream Apply on external vagina nightly. 06/25/15   Gwen Pounds, CNM    Family History Family History  Problem Relation Age of Onset  . Hypertension Mother   . Fibromyalgia Mother   . Cirrhosis Father   . Kidney disease Father   . Asthma Brother   . Birth defects Son     hirschprungs    Social History Social History  Substance Use Topics  . Smoking status: Current Every Day Smoker    Packs/day: 0.25    Years: 14.00    Types: Cigarettes  . Smokeless tobacco: Never Used  . Alcohol use  0.0 oz/week     Comment: Not since June 2014     Allergies   Tramadol   Review of Systems Review of Systems  Constitutional: Negative for fever.  HENT: Positive for hearing loss (right ear). Negative for ear discharge, ear pain and sore throat.   Neurological: Negative for dizziness.  All other systems reviewed and are negative.    Physical Exam Updated Vital Signs BP 131/80   Pulse 69   Temp 99.3 F (37.4 C) (Oral)   Resp 18   Ht 5\' 7"  (1.702 m)   Wt 215 lb (97.5 kg)   SpO2 98%   BMI 33.67 kg/m   Physical Exam  Constitutional: She is oriented to person, place, and time. She appears well-developed and well-nourished. No distress.  HENT:  Head: Normocephalic and atraumatic.  Right Ear:  External ear and ear canal normal. No drainage. No foreign bodies. No mastoid tenderness. Tympanic membrane is erythematous. Tympanic membrane is not perforated and not bulging. Decreased hearing is noted.  Left Ear: Hearing, tympanic membrane, external ear and ear canal normal.  Neck: Normal range of motion.  Pulmonary/Chest: Effort normal.  Neurological: She is alert and oriented to person, place, and time.  Skin: Skin is warm and dry. She is not diaphoretic.  Psychiatric: Her affect is labile. She expresses impulsivity.  Nursing note and vitals reviewed.    ED Treatments / Results   Procedures Procedures  DIAGNOSTIC STUDIES: Oxygen Saturation is 98% on RA, normal by my interpretation.  COORDINATION OF CARE: 5:56 PM-Discussed treatment plan with pt at bedside and pt agreed to plan.    Medications Ordered in ED Medications - No data to display   Initial Impression / Assessment and Plan / ED Course  I have reviewed the triage vital signs and the nursing notes.  Pertinent labs & imaging results that were available during my care of the patient were reviewed by me and considered in my medical decision making (see chart for details).  Clinical Course   30 year old female presents with hearing loss and ear pressure. Exam reveals TM is slightly erythematous. Given that her symptoms have been ongoing for >10 days will rx course of antibiotics as well as Zyrtec-D. Patient is NAD, non-toxic, with stable VS. Patient is informed of clinical course, understands medical decision making process, and agrees with plan. Opportunity for questions provided and all questions answered. Return precautions given.  I personally performed the services described in this documentation, which was scribed in my presence. The recorded information has been reviewed and is accurate.   Final Clinical Impressions(s) / ED Diagnoses   Final diagnoses:  Ear pressure, right  Hearing loss, right    New  Prescriptions Discharge Medication List as of 08/07/2015  6:14 PM    START taking these medications   Details  amoxicillin (AMOXIL) 500 MG capsule Take 1 capsule (500 mg total) by mouth 2 (two) times daily., Starting Wed 08/07/2015, Print    cetirizine-pseudoephedrine (ZYRTEC-D) 5-120 MG tablet Take 1 tablet by mouth daily., Starting Wed 08/07/2015, Print          Recardo Evangelist, PA-C 08/07/15 2106    Ripley Fraise, MD 08/08/15 [phone]

## 2015-08-07 NOTE — ED Notes (Signed)
Pt is in stable condition upon d/c and ambulates from ED. 

## 2015-10-15 ENCOUNTER — Encounter (HOSPITAL_COMMUNITY): Payer: Self-pay | Admitting: *Deleted

## 2015-10-15 DIAGNOSIS — L02412 Cutaneous abscess of left axilla: Secondary | ICD-10-CM | POA: Diagnosis not present

## 2015-10-15 DIAGNOSIS — F17210 Nicotine dependence, cigarettes, uncomplicated: Secondary | ICD-10-CM | POA: Diagnosis not present

## 2015-10-15 NOTE — ED Triage Notes (Signed)
The pt is c/o an abscess under her lt arm for one week.  She has had one 5 years ago under the same arm  lmp  aug

## 2015-10-16 ENCOUNTER — Emergency Department (HOSPITAL_COMMUNITY)
Admission: EM | Admit: 2015-10-16 | Discharge: 2015-10-16 | Disposition: A | Discharge status: Home / Self Care | Payer: Medicaid Other | Attending: Emergency Medicine | Admitting: Emergency Medicine

## 2015-10-16 DIAGNOSIS — L02412 Cutaneous abscess of left axilla: Secondary | ICD-10-CM

## 2015-10-16 MED ORDER — LIDOCAINE-EPINEPHRINE (PF) 2 %-1:200000 IJ SOLN
INTRAMUSCULAR | Status: AC
  Filled 2015-10-16: qty 20

## 2015-10-16 MED ORDER — OXYCODONE-ACETAMINOPHEN 5-325 MG PO TABS
1.0000 | ORAL_TABLET | Freq: Once | ORAL | Status: AC
  Administered 2015-10-16: 1 via ORAL
  Filled 2015-10-16: qty 1

## 2015-10-16 MED ORDER — HYDROCODONE-ACETAMINOPHEN 5-325 MG PO TABS: 1.0000 | ORAL_TABLET | ORAL | 0 refills | Status: DC | PRN

## 2015-10-16 MED ORDER — CEPHALEXIN 500 MG PO CAPS: 500.0000 mg | ORAL_CAPSULE | Freq: Four times a day (QID) | ORAL | 0 refills | Status: AC

## 2015-10-16 MED ORDER — LIDOCAINE-EPINEPHRINE (PF) 2 %-1:200000 IJ SOLN
10.0000 mL | Freq: Once | INTRAMUSCULAR | Status: AC
  Administered 2015-10-16: 10 mL

## 2015-10-16 NOTE — ED Provider Notes (Signed)
[facility] DEPT Provider Note   CSN: [id number] Arrival date & time: 10/15/15  2325     History   Chief Complaint Chief Complaint  Patient presents with  . Abscess    HPI Dawn Brock is a 30 y.o. female.  Patient with painful, swollen area to left axilla without drainage. She has had similar symptoms x 1 in the past. No fever.   The history is provided by the patient. No language interpreter was used.  Abscess  Location:  Shoulder/arm Shoulder/arm abscess location:  L axilla Associated symptoms: no fever     Past Medical History:  Diagnosis Date  . Abnormal Pap smear   . Anemia   . Gastritis   . Hx of chlamydia infection   . Urinary tract infection   . Vaginal Pap smear, abnormal     Patient Active Problem List   Diagnosis Date Noted  . Cocaine abuse complicating pregnancy 123456  . Post term pregnancy 02/21/2013  . Hyperemesis gravidarum with dehydration 09/30/2012  . Alcohol dependence ([place]) 03/12/2012  . Cannabis dependence ([place]) 03/12/2012  . Executive function deficit 03/11/2012    Class: Acute  . Cigarette smoker 03/10/2012  . Duodenitis with nausea, vomiting, diarrhea and abdominal pain 03/10/2012  . Alcohol abuse 03/10/2012  . Hypokalemia 01/26/2012  . UTI (lower urinary tract infection) 01/26/2012    Past Surgical History:  Procedure Laterality Date  . HERNIA REPAIR     As an infant  . LEEP    . LEEP      OB History    Gravida Para Term Preterm AB Living   3 3 3  0 0 3   SAB TAB Ectopic Multiple Live Births   0 0 0 0 3       Home Medications    Prior to Admission medications   Medication Sig Start Date End Date Taking? Authorizing Provider  amoxicillin (AMOXIL) 500 MG capsule Take 1 capsule (500 mg total) by mouth 2 (two) times daily. 08/07/15   Recardo Evangelist, PA-C  cetirizine-pseudoephedrine (ZYRTEC-D) 5-120 MG tablet Take 1 tablet by mouth daily. 08/07/15   Recardo Evangelist, PA-C  ibuprofen (ADVIL,MOTRIN) 800  MG tablet Take 1 tablet (800 mg total) by mouth every 8 (eight) hours as needed. 12/29/14   Dalia Heading, PA-C  nitrofurantoin, macrocrystal-monohydrate, (MACROBID) 100 MG capsule Take 1 capsule (100 mg total) by mouth 2 (two) times daily. 06/25/15   Gwen Pounds, CNM  terconazole (TERAZOL 7) 0.4 % vaginal cream Apply on external vagina nightly. 06/25/15   Gwen Pounds, CNM    Family History Family History  Problem Relation Age of Onset  . Hypertension Mother   . Fibromyalgia Mother   . Cirrhosis Father   . Kidney disease Father   . Asthma Brother   . Birth defects Son     hirschprungs    Social History Social History  Substance Use Topics  . Smoking status: Current Every Day Smoker    Packs/day: 0.25    Years: 14.00    Types: Cigarettes  . Smokeless tobacco: Never Used  . Alcohol use 0.0 oz/week     Comment: Not since June 2014     Allergies   Tramadol   Review of Systems Review of Systems  Constitutional: Negative for fever.  Musculoskeletal: Negative for myalgias.  Skin: Positive for wound.     Physical Exam Updated Vital Signs BP 127/80   Pulse 105   Temp 98.5 F (36.9 C)  Resp 16   Ht 5\' 7"  (1.702 m)   Wt 93.5 kg   LMP 08/15/2015   SpO2 99%   BMI 32.27 kg/m   Physical Exam  Constitutional: She is oriented to person, place, and time. She appears well-developed and well-nourished.  Neck: Normal range of motion.  Pulmonary/Chest: Effort normal.  Neurological: She is alert and oriented to person, place, and time.  Skin: Skin is warm and dry.  Large tender area of swelling with minimal erythema to left axilla with central fluctuance c/w abscess.     ED Treatments / Results  Labs (all labs ordered are listed, but only abnormal results are displayed) Labs Reviewed - No data to display  EKG  EKG Interpretation None       Radiology No results found.  Procedures Procedures (including critical care time) INCISION AND  DRAINAGE Performed by: Charlann Lange A Consent: Verbal consent obtained. Risks and benefits: risks, benefits and alternatives were discussed Type: abscess  Body area: left axilla  Anesthesia: local infiltration  Incision was made with a scalpel.  Local anesthetic: lidocaine 2% w/epinephrine  Anesthetic total: 2 ml  Complexity: complex Blunt dissection to break up loculations  Drainage: purulent  Drainage amount: moderate, purulent  Packing material: None  Patient tolerance: Patient tolerated the procedure well with no immediate complications.    Medications Ordered in ED Medications  oxyCODONE-acetaminophen (PERCOCET/ROXICET) 5-325 MG per tablet 1 tablet (1 tablet Oral Given 10/16/15 0226)  lidocaine-EPINEPHrine (XYLOCAINE W/EPI) 2 %-1:200000 (PF) injection 10 mL (10 mLs Infiltration Given 10/16/15 0314)     Initial Impression / Assessment and Plan / ED Course  I have reviewed the triage vital signs and the nursing notes.  Pertinent labs & imaging results that were available during my care of the patient were reviewed by me and considered in my medical decision making (see chart for details).  Clinical Course    Uncomplicated cutaneous abscess requiring I&D.  Final Clinical Impressions(s) / ED Diagnoses   Final diagnoses:  None  1. Cutaneous abscess  New Prescriptions New Prescriptions   No medications on file     Charlann Lange, Hershal Coria [id number]    Delora Fuel, MD [id number]

## 2016-01-01 ENCOUNTER — Telehealth: Payer: Self-pay | Admitting: *Deleted

## 2016-01-03 NOTE — Telephone Encounter (Signed)
error 

## 2016-04-27 ENCOUNTER — Encounter (HOSPITAL_COMMUNITY): Payer: Self-pay

## 2016-04-27 ENCOUNTER — Inpatient Hospital Stay (HOSPITAL_COMMUNITY)
Admission: AD | Admit: 2016-04-27 | Discharge: 2016-04-27 | Disposition: A | Discharge status: Home / Self Care | Payer: Medicaid Other | Source: Ambulatory Visit | Attending: Family Medicine | Admitting: Family Medicine

## 2016-04-27 DIAGNOSIS — N760 Acute vaginitis: Secondary | ICD-10-CM

## 2016-04-27 DIAGNOSIS — O99331 Smoking (tobacco) complicating pregnancy, first trimester: Secondary | ICD-10-CM | POA: Diagnosis not present

## 2016-04-27 DIAGNOSIS — Z79899 Other long term (current) drug therapy: Secondary | ICD-10-CM | POA: Insufficient documentation

## 2016-04-27 DIAGNOSIS — B9689 Other specified bacterial agents as the cause of diseases classified elsewhere: Secondary | ICD-10-CM | POA: Diagnosis not present

## 2016-04-27 DIAGNOSIS — Z3A11 11 weeks gestation of pregnancy: Secondary | ICD-10-CM | POA: Diagnosis not present

## 2016-04-27 DIAGNOSIS — N949 Unspecified condition associated with female genital organs and menstrual cycle: Secondary | ICD-10-CM

## 2016-04-27 DIAGNOSIS — F17210 Nicotine dependence, cigarettes, uncomplicated: Secondary | ICD-10-CM | POA: Insufficient documentation

## 2016-04-27 DIAGNOSIS — R102 Pelvic and perineal pain: Secondary | ICD-10-CM | POA: Diagnosis present

## 2016-04-27 DIAGNOSIS — O23591 Infection of other part of genital tract in pregnancy, first trimester: Secondary | ICD-10-CM | POA: Diagnosis not present

## 2016-04-27 LAB — URINALYSIS, ROUTINE W REFLEX MICROSCOPIC
Bilirubin Urine: NEGATIVE
GLUCOSE, UA: NEGATIVE mg/dL
Hgb urine dipstick: NEGATIVE
KETONES UR: NEGATIVE mg/dL
LEUKOCYTES UA: NEGATIVE
Nitrite: NEGATIVE
PROTEIN: NEGATIVE mg/dL
Specific Gravity, Urine: 1.02 (ref 1.005–1.030)
pH: 6 (ref 5.0–8.0)

## 2016-04-27 LAB — WET PREP, GENITAL
Sperm: NONE SEEN
TRICH WET PREP: NONE SEEN
Yeast Wet Prep HPF POC: NONE SEEN

## 2016-04-27 LAB — POCT PREGNANCY, URINE: PREG TEST UR: POSITIVE — AB

## 2016-04-27 LAB — RAPID URINE DRUG SCREEN, HOSP PERFORMED
AMPHETAMINES: NOT DETECTED
BARBITURATES: NOT DETECTED
BENZODIAZEPINES: NOT DETECTED
COCAINE: NOT DETECTED
Opiates: NOT DETECTED
Tetrahydrocannabinol: POSITIVE — AB

## 2016-04-27 LAB — GC/CHLAMYDIA PROBE AMP (~~LOC~~) NOT AT ARMC
CHLAMYDIA, DNA PROBE: NEGATIVE
NEISSERIA GONORRHEA: NEGATIVE

## 2016-04-27 MED ORDER — METRONIDAZOLE 500 MG PO TABS: 500.0000 mg | ORAL_TABLET | Freq: Two times a day (BID) | ORAL | 0 refills | Status: DC

## 2016-04-27 MED ORDER — PROMETHAZINE HCL 25 MG PO TABS
25.0000 mg | ORAL_TABLET | Freq: Once | ORAL | Status: AC
  Administered 2016-04-27: 25 mg via ORAL
  Filled 2016-04-27: qty 1

## 2016-04-27 NOTE — MAU Provider Note (Signed)
History     CSN: [id number]  Arrival date and time: 04/27/16 0231   First Provider Initiated Contact with Patient 04/27/16 [phone]      Chief Complaint  Patient presents with  . Pelvic Pain  . Vaginal Discharge   Pelvic Pain  The patient's primary symptoms include pelvic pain and vaginal discharge. This is a new problem. The current episode started in the past 7 days. The problem occurs constantly. The problem has been unchanged. The pain is mild. The problem affects both sides. She is pregnant. Associated symptoms include dysuria and nausea. Pertinent negatives include no chills, fever, frequency, urgency or vomiting. The vaginal discharge was malodorous and white. There has been no bleeding. She has not been passing clots. Nothing aggravates the symptoms. She has tried nothing for the symptoms. Menstrual history: LMP 02/10/16    Past Medical History:  Diagnosis Date  . Abnormal Pap smear   . Anemia   . Gastritis   . Hx of chlamydia infection   . Urinary tract infection   . Vaginal Pap smear, abnormal     Past Surgical History:  Procedure Laterality Date  . HERNIA REPAIR     As an infant  . LEEP    . LEEP      Family History  Problem Relation Age of Onset  . Hypertension Mother   . Fibromyalgia Mother   . Cirrhosis Father   . Kidney disease Father   . Asthma Brother   . Birth defects Son     hirschprungs    Social History  Substance Use Topics  . Smoking status: Current Every Day Smoker    Packs/day: 0.25    Years: 14.00    Types: Cigarettes  . Smokeless tobacco: Never Used  . Alcohol use 0.0 oz/week     Comment: Not since June 2014    Allergies:  Allergies  Allergen Reactions  . Tramadol Nausea And Vomiting    Prescriptions Prior to Admission  Medication Sig Dispense Refill Last Dose  . amoxicillin (AMOXIL) 500 MG capsule Take 1 capsule (500 mg total) by mouth 2 (two) times daily. 14 capsule 0   . cetirizine-pseudoephedrine (ZYRTEC-D) 5-120 MG tablet  Take 1 tablet by mouth daily. 15 tablet 0   . HYDROcodone-acetaminophen (NORCO/VICODIN) 5-325 MG tablet Take 1-2 tablets by mouth every 4 (four) hours as needed. 12 tablet 0   . ibuprofen (ADVIL,MOTRIN) 800 MG tablet Take 1 tablet (800 mg total) by mouth every 8 (eight) hours as needed. 21 tablet 0 More than a month at Unknown time  . nitrofurantoin, macrocrystal-monohydrate, (MACROBID) 100 MG capsule Take 1 capsule (100 mg total) by mouth 2 (two) times daily. 14 capsule 0   . terconazole (TERAZOL 7) 0.4 % vaginal cream Apply on external vagina nightly. 45 g 0     Review of Systems  Constitutional: Negative for chills and fever.  Gastrointestinal: Positive for nausea. Negative for vomiting.  Genitourinary: Positive for dysuria, pelvic pain and vaginal discharge. Negative for frequency, urgency and vaginal bleeding.   Physical Exam   Blood pressure 124/63, pulse 80, temperature 98.5 F (36.9 C), temperature source Oral, resp. rate 20, height 5' 7.5" (1.715 m), weight 97.1 kg (214 lb), last menstrual period 02/10/2016, SpO2 100 %.  Physical Exam  Nursing note and vitals reviewed. Constitutional: She is oriented to person, place, and time. She appears well-developed and well-nourished. No distress.  HENT:  Head: Normocephalic.  Cardiovascular: Normal rate.   Respiratory: Effort normal.  GI: Soft.  There is no tenderness. There is no rebound.  Genitourinary:  Genitourinary Comments:  External: no lesion Vagina: small amount of white discharge Cervix: pink, smooth, no CMT Uterus: AGA   Neurological: She is alert and oriented to person, place, and time.  Skin: Skin is warm and dry.  Psychiatric: She has a normal mood and affect.    FHT 167 with doppler   Results for orders placed or performed during the hospital encounter of 04/27/16 (from the past 24 hour(s))  Urinalysis, Routine w reflex microscopic     Status: Abnormal   Collection Time: 04/27/16  2:44 AM  Result Value Ref Range    Color, Urine YELLOW YELLOW   APPearance HAZY (A) CLEAR   Specific Gravity, Urine 1.020 1.005 - 1.030   pH 6.0 5.0 - 8.0   Glucose, UA NEGATIVE NEGATIVE mg/dL   Hgb urine dipstick NEGATIVE NEGATIVE   Bilirubin Urine NEGATIVE NEGATIVE   Ketones, ur NEGATIVE NEGATIVE mg/dL   Protein, ur NEGATIVE NEGATIVE mg/dL   Nitrite NEGATIVE NEGATIVE   Leukocytes, UA NEGATIVE NEGATIVE  Pregnancy, urine POC     Status: Abnormal   Collection Time: 04/27/16  3:04 AM  Result Value Ref Range   Preg Test, Ur POSITIVE (A) NEGATIVE  Wet prep, genital     Status: Abnormal   Collection Time: 04/27/16  3:25 AM  Result Value Ref Range   Yeast Wet Prep HPF POC NONE SEEN NONE SEEN   Trich, Wet Prep NONE SEEN NONE SEEN   Clue Cells Wet Prep HPF POC PRESENT (A) NONE SEEN   WBC, Wet Prep HPF POC MANY (A) NONE SEEN   Sperm NONE SEEN     MAU Course  Procedures  MDM   Assessment and Plan   1. Bacterial vaginosis   2. Round ligament pain   3. [redacted] weeks gestation of pregnancy    DC home Comfort measures reviewed  1st/2nd Trimester precautions  RX: flagyl 500mg  BID #14  Return to MAU as needed FU with OB as planned  Follow-up Information    [facility] Follow up.   Contact information: [place] [id number] [phone]            Mathis Bud 04/27/2016, 3:16 AM

## 2016-04-27 NOTE — MAU Note (Signed)
Pt c/o lower abdominal pain that started 1 day ago. States that she has a vaginal discharge as well. Pt denies vaginal bleeding. LMP: 02/10/2016

## 2016-04-27 NOTE — Discharge Instructions (Signed)

## 2016-05-10 ENCOUNTER — Inpatient Hospital Stay (HOSPITAL_COMMUNITY)
Admission: AD | Admit: 2016-05-10 | Discharge: 2016-05-10 | Disposition: A | Discharge status: Home / Self Care | Payer: Medicaid Other | Source: Ambulatory Visit | Attending: Obstetrics & Gynecology | Admitting: Obstetrics & Gynecology

## 2016-05-10 ENCOUNTER — Encounter (HOSPITAL_COMMUNITY): Payer: Self-pay | Admitting: *Deleted

## 2016-05-10 DIAGNOSIS — Z841 Family history of disorders of kidney and ureter: Secondary | ICD-10-CM | POA: Diagnosis not present

## 2016-05-10 DIAGNOSIS — F17210 Nicotine dependence, cigarettes, uncomplicated: Secondary | ICD-10-CM | POA: Insufficient documentation

## 2016-05-10 DIAGNOSIS — O219 Vomiting of pregnancy, unspecified: Secondary | ICD-10-CM | POA: Diagnosis not present

## 2016-05-10 DIAGNOSIS — O99331 Smoking (tobacco) complicating pregnancy, first trimester: Secondary | ICD-10-CM | POA: Insufficient documentation

## 2016-05-10 DIAGNOSIS — Z3A12 12 weeks gestation of pregnancy: Secondary | ICD-10-CM | POA: Insufficient documentation

## 2016-05-10 DIAGNOSIS — Z825 Family history of asthma and other chronic lower respiratory diseases: Secondary | ICD-10-CM | POA: Diagnosis not present

## 2016-05-10 DIAGNOSIS — O209 Hemorrhage in early pregnancy, unspecified: Secondary | ICD-10-CM | POA: Insufficient documentation

## 2016-05-10 DIAGNOSIS — N939 Abnormal uterine and vaginal bleeding, unspecified: Secondary | ICD-10-CM | POA: Diagnosis present

## 2016-05-10 DIAGNOSIS — Z3491 Encounter for supervision of normal pregnancy, unspecified, first trimester: Secondary | ICD-10-CM

## 2016-05-10 DIAGNOSIS — Z888 Allergy status to other drugs, medicaments and biological substances status: Secondary | ICD-10-CM | POA: Insufficient documentation

## 2016-05-10 DIAGNOSIS — Z8249 Family history of ischemic heart disease and other diseases of the circulatory system: Secondary | ICD-10-CM | POA: Insufficient documentation

## 2016-05-10 LAB — URINALYSIS, ROUTINE W REFLEX MICROSCOPIC
BACTERIA UA: NONE SEEN
Bilirubin Urine: NEGATIVE
Glucose, UA: NEGATIVE mg/dL
Ketones, ur: NEGATIVE mg/dL
Leukocytes, UA: NEGATIVE
NITRITE: NEGATIVE
PROTEIN: 30 mg/dL — AB
Specific Gravity, Urine: 1.027 (ref 1.005–1.030)
pH: 6 (ref 5.0–8.0)

## 2016-05-10 MED ORDER — PROMETHAZINE HCL 25 MG PO TABS: 25.0000 mg | ORAL_TABLET | Freq: Four times a day (QID) | ORAL | 0 refills | Status: DC | PRN

## 2016-05-10 NOTE — MAU Note (Signed)
Pt having vaginal bleeding for two days when she wipes and a little bit in her panty liner.  It is a light pink.  Some minor abdominal cramping.

## 2016-05-10 NOTE — MAU Provider Note (Signed)
History     CSN: [id number]  Arrival date and time: 05/10/16 1533  First Provider Initiated Contact with Patient 05/10/16 1613      Chief Complaint  Patient presents with  . Vaginal Bleeding   HPI Dawn Brock is a 31 y.o. [phone] at [redacted]w[redacted]d who presents with vaginal bleeding. Repots pin/red spotting on toilet paper & underwear x 2 days. No bleeding into pad & not passing clots. Also reports mild lower abdominal cramping that she rates 3/10. Has not treated. Endorses nausea & occasional vomiting; hasn't vomited today. Denies dysuria, diarrhea, constipation. Had intercourse last night.  Has appointment with [facility] for initial OB visit this Thursday.  OB History    Gravida Para Term Preterm AB Living   4 3 3  0 0 3   SAB TAB Ectopic Multiple Live Births   0 0 0 0 3      Past Medical History:  Diagnosis Date  . Abnormal Pap smear   . Anemia   . Gastritis   . Hx of chlamydia infection   . Urinary tract infection   . Vaginal Pap smear, abnormal     Past Surgical History:  Procedure Laterality Date  . HERNIA REPAIR     As an infant  . LEEP    . LEEP      Family History  Problem Relation Age of Onset  . Hypertension Mother   . Fibromyalgia Mother   . Cirrhosis Father   . Kidney disease Father   . Asthma Brother   . Birth defects Son     hirschprungs    Social History  Substance Use Topics  . Smoking status: Current Every Day Smoker    Packs/day: 0.25    Years: 14.00    Types: Cigarettes  . Smokeless tobacco: Never Used  . Alcohol use 0.0 oz/week     Comment: Not since June 2014    Allergies:  Allergies  Allergen Reactions  . Tramadol Nausea And Vomiting    Prescriptions Prior to Admission  Medication Sig Dispense Refill Last Dose  . ondansetron (ZOFRAN) 8 MG tablet Take 8 mg by mouth every 8 (eight) hours as needed for nausea or vomiting.   05/09/2016 at Unknown time  . Prenatal Vit-Fe Fumarate-FA (PRENATAL MULTIVITAMIN) TABS tablet Take 1 tablet by  mouth daily at 12 noon.   05/09/2016 at Unknown time  . metroNIDAZOLE (FLAGYL) 500 MG tablet Take 1 tablet (500 mg total) by mouth 2 (two) times daily. (Patient not taking: Reported on 05/10/2016) 14 tablet 0 Completed Course at Unknown time    Review of Systems  Constitutional: Negative.   Gastrointestinal: Positive for abdominal pain and nausea. Negative for constipation, diarrhea and vomiting.  Genitourinary: Positive for vaginal bleeding. Negative for dysuria and vaginal discharge.   Physical Exam   Blood pressure 128/77, pulse 91, temperature 97.7 F (36.5 C), temperature source Oral, resp. rate 16, last menstrual period 02/10/2016.  Physical Exam  Nursing note and vitals reviewed. Constitutional: She is oriented to person, place, and time. She appears well-developed and well-nourished. No distress.  HENT:  Head: Normocephalic and atraumatic.  Eyes: Conjunctivae are normal. Right eye exhibits no discharge. Left eye exhibits no discharge. No scleral icterus.  Neck: Normal range of motion.  Cardiovascular: Normal rate, regular rhythm and normal heart sounds.   No murmur heard. Respiratory: Effort normal and breath sounds normal. No respiratory distress. She has no wheezes.  GI: Soft. Bowel sounds are normal. There is no tenderness.  Genitourinary: No bleeding in the vagina. Vaginal discharge (minimal amount of thin white discharge) found.  Genitourinary Comments: Cervix closed  Neurological: She is alert and oriented to person, place, and time.  Skin: Skin is warm and dry. She is not diaphoretic.  Psychiatric: She has a normal mood and affect. Her behavior is normal. Judgment and thought content normal.    MAU Course  Procedures Results for orders placed or performed during the hospital encounter of 05/10/16 (from the past 24 hour(s))  Urinalysis, Routine w reflex microscopic     Status: Abnormal   Collection Time: 05/10/16  3:33 PM  Result Value Ref Range   Color, Urine YELLOW  YELLOW   APPearance HAZY (A) CLEAR   Specific Gravity, Urine 1.027 1.005 - 1.030   pH 6.0 5.0 - 8.0   Glucose, UA NEGATIVE NEGATIVE mg/dL   Hgb urine dipstick LARGE (A) NEGATIVE   Bilirubin Urine NEGATIVE NEGATIVE   Ketones, ur NEGATIVE NEGATIVE mg/dL   Protein, ur 30 (A) NEGATIVE mg/dL   Nitrite NEGATIVE NEGATIVE   Leukocytes, UA NEGATIVE NEGATIVE   RBC / HPF TOO NUMEROUS TO COUNT 0 - 5 RBC/hpf   WBC, UA 6-30 0 - 5 WBC/hpf   Bacteria, UA NONE SEEN NONE SEEN   Squamous Epithelial / LPF 6-30 (A) NONE SEEN   Mucous PRESENT     MDM O positive FHT 152 No blood on exam Cervix closed VSS, NAD  Assessment and Plan  A: 1. Fetal heart tones present, first trimester   2. Vaginal bleeding in pregnancy, first trimester   3. Nausea and vomiting during pregnancy prior to [redacted] weeks gestation    P: Discharge home Rx phenergan Discussed reasons to return to MAU Keep follow up appointment with OB/PCP  Urine culture pending   Jorje Guild 05/10/2016, 4:04 PM

## 2016-05-10 NOTE — Discharge Instructions (Signed)
Vaginal Bleeding During Pregnancy, Second Trimester A small amount of bleeding (spotting) from the vagina is relatively common in pregnancy. It usually stops on its own. Various things can cause bleeding or spotting in pregnancy. Some bleeding may be related to the pregnancy, and some may not. Sometimes the bleeding is normal and is not a problem. However, bleeding can also be a sign of something serious. Be sure to tell your health care provider about any vaginal bleeding right away. Some possible causes of vaginal bleeding during the second trimester include:  Infection, inflammation, or growths on the cervix.  The placenta may be partially or completely covering the opening of the cervix inside the uterus (placenta previa).  The placenta may have separated from the uterus (abruption of the placenta).  You may be having early (preterm) labor.  The cervix may not be strong enough to keep a baby inside the uterus (cervical insufficiency).  Tiny cysts may have developed in the uterus instead of pregnancy tissue (molar pregnancy). Follow these instructions at home: Watch your condition for any changes. The following actions may help to lessen any discomfort you are feeling:  Follow your health care provider's instructions for limiting your activity. If your health care provider orders bed rest, you may need to stay in bed and only get up to use the bathroom. However, your health care provider may allow you to continue light activity.  If needed, make plans for someone to help with your regular activities and responsibilities while you are on bed rest.  Keep track of the number of pads you use each day, how often you change pads, and how soaked (saturated) they are. Write this down.  Do not use tampons. Do not douche.  Do not have sexual intercourse or orgasms until approved by your health care provider.  If you pass any tissue from your vagina, save the tissue so you can show it to your  health care provider.  Only take over-the-counter or prescription medicines as directed by your health care provider.  Do not take aspirin because it can make you bleed.  Do not exercise or perform any strenuous activities or heavy lifting without your health care provider's permission.  Keep all follow-up appointments as directed by your health care provider. Contact a health care provider if:  You have any vaginal bleeding during any part of your pregnancy.  You have cramps or labor pains.  You have a fever, not controlled by medicine. Get help right away if:  You have severe cramps in your back or belly (abdomen).  You have contractions.  You have chills.  You pass large clots or tissue from your vagina.  Your bleeding increases.  You feel light-headed or weak, or you have fainting episodes.  You are leaking fluid or have a gush of fluid from your vagina. This information is not intended to replace advice given to you by your health care provider. Make sure you discuss any questions you have with your health care provider. Document Released: 10/08/2004 Document Revised: 06/06/2015 Document Reviewed: 09/05/2012 Elsevier Interactive Patient Education  2017 [employer].

## 2016-05-11 LAB — CULTURE, OB URINE

## 2016-05-14 LAB — OB RESULTS CONSOLE GC/CHLAMYDIA
Chlamydia: NEGATIVE
Gonorrhea: NEGATIVE

## 2016-05-14 LAB — OB RESULTS CONSOLE ABO/RH: RH Type: POSITIVE

## 2016-05-14 LAB — OB RESULTS CONSOLE RPR: RPR: NONREACTIVE

## 2016-05-14 LAB — OB RESULTS CONSOLE HEPATITIS B SURFACE ANTIGEN: HEP B S AG: NEGATIVE

## 2016-05-14 LAB — OB RESULTS CONSOLE HIV ANTIBODY (ROUTINE TESTING): HIV: NONREACTIVE

## 2016-05-14 LAB — OB RESULTS CONSOLE RUBELLA ANTIBODY, IGM: RUBELLA: IMMUNE

## 2016-05-14 LAB — OB RESULTS CONSOLE ANTIBODY SCREEN: ANTIBODY SCREEN: NEGATIVE

## 2016-06-15 ENCOUNTER — Encounter (HOSPITAL_COMMUNITY): Payer: Self-pay | Admitting: *Deleted

## 2016-06-15 ENCOUNTER — Inpatient Hospital Stay (HOSPITAL_COMMUNITY)
Admission: AD | Admit: 2016-06-15 | Discharge: 2016-06-15 | Disposition: A | Discharge status: Home / Self Care | Payer: Medicaid Other | Source: Ambulatory Visit | Attending: Obstetrics and Gynecology | Admitting: Obstetrics and Gynecology

## 2016-06-15 DIAGNOSIS — M542 Cervicalgia: Secondary | ICD-10-CM

## 2016-06-15 DIAGNOSIS — G5603 Carpal tunnel syndrome, bilateral upper limbs: Secondary | ICD-10-CM | POA: Insufficient documentation

## 2016-06-15 DIAGNOSIS — Z3A18 18 weeks gestation of pregnancy: Secondary | ICD-10-CM | POA: Insufficient documentation

## 2016-06-15 DIAGNOSIS — R110 Nausea: Secondary | ICD-10-CM | POA: Diagnosis present

## 2016-06-15 DIAGNOSIS — N949 Unspecified condition associated with female genital organs and menstrual cycle: Secondary | ICD-10-CM

## 2016-06-15 DIAGNOSIS — R202 Paresthesia of skin: Secondary | ICD-10-CM | POA: Insufficient documentation

## 2016-06-15 DIAGNOSIS — O9989 Other specified diseases and conditions complicating pregnancy, childbirth and the puerperium: Secondary | ICD-10-CM

## 2016-06-15 DIAGNOSIS — R109 Unspecified abdominal pain: Secondary | ICD-10-CM | POA: Insufficient documentation

## 2016-06-15 DIAGNOSIS — O26899 Other specified pregnancy related conditions, unspecified trimester: Secondary | ICD-10-CM

## 2016-06-15 DIAGNOSIS — O99332 Smoking (tobacco) complicating pregnancy, second trimester: Secondary | ICD-10-CM | POA: Diagnosis not present

## 2016-06-15 DIAGNOSIS — M791 Myalgia: Secondary | ICD-10-CM | POA: Diagnosis not present

## 2016-06-15 DIAGNOSIS — G5600 Carpal tunnel syndrome, unspecified upper limb: Secondary | ICD-10-CM

## 2016-06-15 DIAGNOSIS — O26892 Other specified pregnancy related conditions, second trimester: Secondary | ICD-10-CM | POA: Diagnosis present

## 2016-06-15 LAB — URINALYSIS, ROUTINE W REFLEX MICROSCOPIC
BILIRUBIN URINE: NEGATIVE
Glucose, UA: NEGATIVE mg/dL
HGB URINE DIPSTICK: NEGATIVE
Ketones, ur: NEGATIVE mg/dL
Leukocytes, UA: NEGATIVE
NITRITE: NEGATIVE
PROTEIN: NEGATIVE mg/dL
Specific Gravity, Urine: 1.019 (ref 1.005–1.030)
pH: 6 (ref 5.0–8.0)

## 2016-06-15 MED ORDER — CYCLOBENZAPRINE HCL 5 MG PO TABS: 5.0000 mg | ORAL_TABLET | Freq: Every day | ORAL | 0 refills | Status: DC

## 2016-06-15 MED ORDER — PROMETHAZINE HCL 25 MG PO TABS: 25.0000 mg | ORAL_TABLET | Freq: Four times a day (QID) | ORAL | 0 refills | Status: DC | PRN

## 2016-06-15 NOTE — MAU Note (Signed)
PT SAYS   BOTH HANDS AND ARMS UP TO SHOULDERS  ARE NUMB-   STARTED   2 WEEKS  AGO   AND  COMES/ GOES.         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       FEELS NAUSEA  OFTEN

## 2016-06-15 NOTE — Discharge Instructions (Signed)
Carpal Tunnel Syndrome Carpal tunnel syndrome is a condition that causes pain in your hand and arm. The carpal tunnel is a narrow area that is on the palm side of your wrist. Repeated wrist motion or certain diseases may cause swelling in the tunnel. This swelling can pinch the main nerve in the wrist (median nerve). Follow these instructions at home: If you have a splint:  Wear it as told by your doctor. Remove it only as told by your doctor.  Loosen the splint if your fingers: ? Become numb and tingle. ? Turn blue and cold.  Keep the splint clean and dry. General instructions  Take over-the-counter and prescription medicines only as told by your doctor.  Rest your wrist from any activity that may be causing your pain. If needed, talk to your employer about changes that can be made in your work, such as getting a wrist pad to use while typing.  If directed, apply ice to the painful area: ? Put ice in a plastic bag. ? Place a towel between your skin and the bag. ? Leave the ice on for 20 minutes, 2-3 times per day.  Keep all follow-up visits as told by your doctor. This is important.  Do any exercises as told by your doctor, physical therapist, or occupational therapist. Contact a doctor if:  You have new symptoms.  Medicine does not help your pain.  Your symptoms get worse. This information is not intended to replace advice given to you by your health care provider. Make sure you discuss any questions you have with your health care provider. Document Released: 12/18/2010 Document Revised: 06/06/2015 Document Reviewed: 05/16/2014 Elsevier Interactive Patient Education  2018 [employer].    Neck Exercises Neck exercises can be important for many reasons:  They can help you to improve and maintain flexibility in your neck. This can be especially important as you age.  They can help to make your neck stronger. This can make movement easier.  They can reduce or prevent  neck pain.  They may help your upper back.  Ask your health care provider which neck exercises would be best for you. Exercises Neck Press Repeat this exercise 10 times. Do it first thing in the morning and right before bed or as told by your health care provider. 1. Lie on your back on a firm bed or on the floor with a pillow under your head. 2. Use your neck muscles to push your head down on the pillow and straighten your spine. 3. Hold the position as well as you can. Keep your head facing up and your chin tucked. 4. Slowly count to 5 while holding this position. 5. Relax for a few seconds. Then repeat.  Isometric Strengthening Do a full set of these exercises 2 times a day or as told by your health care provider. 1. Sit in a supportive chair and place your hand on your forehead. 2. Push forward with your head and neck while pushing back with your hand. Hold for 10 seconds. 3. Relax. Then repeat the exercise 3 times. 4. Next, do thesequence again, this time putting your hand against the back of your head. Use your head and neck to push backward against the hand pressure. 5. Finally, do the same exercise on either side of your head, pushing sideways against the pressure of your hand.  Supine Head Lifts Repeat this exercise 8-10 times. Do this 2 times a day or as told by your health care provider. 1.  Lie on your back, bending your knees to point to the ceiling and keeping your feet flat on the floor. 2. Lift your head slowly off the floor, raising your chin toward your chest. 3. Hold for 5 seconds. 4. Relax and repeat.  Scapular Retraction Repeat this exercise 5 times. Do this 2 times a day or as told by your health care provider. 1. Stand with your arms at your sides. Look straight ahead. 2. Slowly pull both shoulders backward and downward until you feel a stretch between your shoulder blades in your upper back. 3. Hold for 10-30 seconds. 4. Relax and repeat.  Contact a health  care provider if:  Your neck pain or discomfort gets much worse when you do an exercise.  Your neck pain or discomfort does not improve within 2 hours after you exercise. If you have any of these problems, stop exercising right away. Do not do the exercises again unless your health care provider says that you can. Get help right away if:  You develop sudden, severe neck pain. If this happens, stop exercising right away. Do not do the exercises again unless your health care provider says that you can. Exercises Neck Stretch  Repeat this exercise 3-5 times. 1. Do this exercise while standing or while sitting in a chair. 2. Place your feet flat on the floor, shoulder-width apart. 3. Slowly turn your head to the right. Turn it all the way to the right so you can look over your right shoulder. Do not tilt or tip your head. 4. Hold this position for 10-30 seconds. 5. Slowly turn your head to the left, to look over your left shoulder. 6. Hold this position for 10-30 seconds.  Neck Retraction Repeat this exercise 8-10 times. Do this 3-4 times a day or as told by your health care provider. 1. Do this exercise while standing or while sitting in a sturdy chair. 2. Look straight ahead. Do not bend your neck. 3. Use your fingers to push your chin backward. Do not bend your neck for this movement. Continue to face straight ahead. If you are doing the exercise properly, you will feel a slight sensation in your throat and a stretch at the back of your neck. 4. Hold the stretch for 1-2 seconds. Relax and repeat.  This information is not intended to replace advice given to you by your health care provider. Make sure you discuss any questions you have with your health care provider. Document Released: 12/10/2014 Document Revised: 06/06/2015 Document Reviewed: 07/09/2014 Elsevier Interactive Patient Education  2017 [place].  Round Ligament Pain The round ligament is a cord of muscle and tissue that  helps to support the uterus. It can become a source of pain during pregnancy if it becomes stretched or twisted as the baby grows. The pain usually begins in the second trimester of pregnancy, and it can come and go until the baby is delivered. It is not a serious problem, and it does not cause harm to the baby. Round ligament pain is usually a short, sharp, and pinching pain, but it can also be a dull, lingering, and aching pain. The pain is felt in the lower side of the abdomen or in the groin. It usually starts deep in the groin and moves up to the outside of the hip area. Pain can occur with:  A sudden change in position.  Rolling over in bed.  Coughing or sneezing.  Physical activity.  Follow these instructions at home: Watch  your condition for any changes. Take these steps to help with your pain:  When the pain starts, relax. Then try: ? Sitting down. ? Flexing your knees up to your abdomen. ? Lying on your side with one pillow under your abdomen and another pillow between your legs. ? Sitting in a warm bath for 15-20 minutes or until the pain goes away.  Take over-the-counter and prescription medicines only as told by your health care provider.  Move slowly when you sit and stand.  Avoid long walks if they cause pain.  Stop or lessen your physical activities if they cause pain.  Contact a health care provider if:  Your pain does not go away with treatment.  You feel pain in your back that you did not have before.  Your medicine is not helping. Get help right away if:  You develop a fever or chills.  You develop uterine contractions.  You develop vaginal bleeding.  You develop nausea or vomiting.  You develop diarrhea.  You have pain when you urinate. This information is not intended to replace advice given to you by your health care provider. Make sure you discuss any questions you have with your health care provider. Document Released: 10/08/2007 Document  Revised: 06/06/2015 Document Reviewed: 03/07/2014 Elsevier Interactive Patient Education  [employer].

## 2016-06-15 NOTE — MAU Note (Addendum)
Pt reports lower abdominal pain and cramping and nausea. States started a few days ago. States she has had nausea and vomiting during the pregnancy, does not currently have nausea medication. Pt denies vaginal bleeding or vaginal discharge. Pt also report numbness and tingling in both hands.

## 2016-06-15 NOTE — MAU Provider Note (Signed)
Chief Complaint: Nausea and Abdominal Pain   SUBJECTIVE HPI: Dawn Brock is a 31 y.o. [phone] at [redacted]w[redacted]d who presents to [facility]missions reporting multiple complaints: nausea, hand/arm tingling bilaterally, neck pain, abdominal pain when walking.  States she has been having bad arm/hand tingling daily, especially in the morning when she wakes and while at work. She states she works at [employer], [place] a lot. She also states she has to frequently shake out her hands to make them feel better. Is provoked when her hands get swollen, improves with shaking hands or elevated arms at nighttime. Denies any further neurologic complaints. States her left neck area hurts as well and when you press on it, worsens the symptoms.  Patient states she still has nausea, ran out of her phenergan 1-2 months ago. Concerned she is not gaining enough weight (not losing weight).   States she has some abdominal pain when she is walking or standing for a long period of time, feels like a pulling sensation. Does not feel like contractions, no tightening of abdomen, does not come/go like a contraction. More persistent, relieved when she sits or lays down. Denies any VB, LOF, or abnormal vag discharge. Denies any vaginal pressure or fullness.       Past Medical History:  Diagnosis Date  . Abnormal Pap smear   . Anemia   . Gastritis   . Hx of chlamydia infection   . Vaginal Pap smear, abnormal    OB History  Gravida Para Term Preterm AB Living  4 3 3  0 0 3  SAB TAB Ectopic Multiple Live Births  0 0 0 0 3    # Outcome Date GA Lbr Len/2nd Weight Sex Delivery Anes PTL Lv  4 Current           3 Term 02/22/13 [redacted]w[redacted]d 10:29 / 00:57 8 lb 2 oz (3.685 kg) M Vag-Spont EPI  LIV  2 Term 2008 [redacted]w[redacted]d 10:00 8 lb 6 oz (3.799 kg) M Vag-Spont EPI  LIV     Birth Comments: son has Hirschprungs Disease  1 Term 2006 [redacted]w[redacted]d 07:00 6 lb 8 oz (2.948 kg) F Vag-Spont EPI  LIV     Past Surgical History:  Procedure  Laterality Date  . HERNIA REPAIR     As an infant  . LEEP     Social History   Social History  . Marital status: Single    Spouse name: N/A  . Number of children: N/A  . Years of education: N/A   Occupational History  . Not on file.   Social History Main Topics  . Smoking status: Current Every Day Smoker    Packs/day: 0.25    Years: 14.00    Types: Cigarettes  . Smokeless tobacco: Never Used  . Alcohol use 0.0 oz/week     Comment: Not since June 2014  . Drug use: Yes    Types: Cocaine, Marijuana     Comment: Patient denies any recent use of [place] and cocaine as of 07/02/12    LAST SMOKED  MARIJUANA-   LAST WEEK LAST USED COCAINE-    2015  . Sexual activity: Yes     Comment: Missed Depo shot in July   Other Topics Concern  . Not on file   Social History Narrative  . No narrative on file   No current facility-administered medications on file prior to encounter.    Current Outpatient Prescriptions on File Prior to Encounter  Medication Sig Dispense  Refill  . Prenatal Vit-Fe Fumarate-FA (PRENATAL MULTIVITAMIN) TABS tablet Take 1 tablet by mouth daily at 12 noon.     Allergies  Allergen Reactions  . Tramadol Nausea And Vomiting    I have reviewed the past Medical Hx, Surgical Hx, Social Hx, Allergies and Medications.   REVIEW OF SYSTEMS  A comprehensive ROS was negative except per HPI.   OBJECTIVE Patient Vitals for the past 24 hrs:  BP Temp Temp src Pulse Resp SpO2 Height Weight  06/15/16 2147 109/60 - - 86 - - - -  06/15/16 1948 124/70 - - - - - - -  06/15/16 1947 - 98.5 F (36.9 C) Oral (!) 106 18 100 % 5\' 8"  (1.727 m) 216 lb (98 kg)    PHYSICAL EXAM Constitutional: Well-developed, well-nourished obese female in no acute distress.  Cardiovascular: normal rate, rhythm, no murmurs Respiratory: normal rate and effort. CTAB GI: Abd soft, non-tender, non-distended. Pos BS x 4 MS: Extremities nontender, no edema, decreased ROM of neck R>L. Bilateral  trapezius tightness, when compresses on left trapezius, elicits pain and numbness/tingling down arm.  Neurologic: Alert and oriented x 4. CN II-XII grossly intact.  GU: Neg CVAT.    LAB RESULTS Results for orders placed or performed during the hospital encounter of 06/15/16 (from the past 24 hour(s))  Urinalysis, Routine w reflex microscopic     Status: None   Collection Time: 06/15/16  7:51 PM  Result Value Ref Range   Color, Urine YELLOW YELLOW   APPearance CLEAR CLEAR   Specific Gravity, Urine 1.019 1.005 - 1.030   pH 6.0 5.0 - 8.0   Glucose, UA NEGATIVE NEGATIVE mg/dL   Hgb urine dipstick NEGATIVE NEGATIVE   Bilirubin Urine NEGATIVE NEGATIVE   Ketones, ur NEGATIVE NEGATIVE mg/dL   Protein, ur NEGATIVE NEGATIVE mg/dL   Nitrite NEGATIVE NEGATIVE   Leukocytes, UA NEGATIVE NEGATIVE    IMAGING No results found.  MAU COURSE Demonstrated neck exercises for patient to do at home. Musculoskeletal exam Doppler: 150 bpm  MDM Plan of care reviewed with patient, including labs and tests ordered and medical treatment. Discussed using maternity belt for work. Demonstrated neck exercises to perform daily (twice daily), and to use heating pad for neck. Muscle relaxant at bedtime with precautions for driving and taking care of her children when using as can make drowsy. Wrist splints encouraged at bedtime and during daytime with activities that aggravate. Rx sent for nausea medication. Discussed these things are best to follow up with PCP or OB provider for. Discussed healthy weight gain in pregnancy, and reminded patient of fetal needs nutritionally.   ASSESSMENT 1. Muscle pain, cervical   2. Round ligament pain   3. Carpal tunnel syndrome during pregnancy     PLAN Discharge home in stable condition. Wrist splints Rx Flexeril Rx Phenergan Note for work for today Heating pad Neck exercises/stretches   Follow-up Information    Department, [facility]. Go in 1 week(s).    Why:  Routine OB appointment Contact information: [address] [place] [address] [phone]          Allergies as of 06/15/2016      Reactions   Tramadol Nausea And Vomiting      Medication List    TAKE these medications   cyclobenzaprine 5 MG tablet Commonly known as:  FLEXERIL Take 1 tablet (5 mg total) by mouth at bedtime. For neck pain.   prenatal multivitamin Tabs tablet Take 1 tablet by mouth  daily at 12 noon.   promethazine 25 MG tablet Commonly known as:  PHENERGAN Take 1 tablet (25 mg total) by mouth every 6 (six) hours as needed for nausea or vomiting.        Katherine Basset, DO OB Fellow 06/15/2016 9:58 PM

## 2016-06-18 ENCOUNTER — Encounter (HOSPITAL_COMMUNITY): Payer: Self-pay | Admitting: Emergency Medicine

## 2016-06-18 DIAGNOSIS — J45909 Unspecified asthma, uncomplicated: Secondary | ICD-10-CM | POA: Insufficient documentation

## 2016-06-18 DIAGNOSIS — J449 Chronic obstructive pulmonary disease, unspecified: Secondary | ICD-10-CM | POA: Diagnosis not present

## 2016-06-18 DIAGNOSIS — F17210 Nicotine dependence, cigarettes, uncomplicated: Secondary | ICD-10-CM | POA: Diagnosis not present

## 2016-06-18 DIAGNOSIS — O9989 Other specified diseases and conditions complicating pregnancy, childbirth and the puerperium: Secondary | ICD-10-CM | POA: Insufficient documentation

## 2016-06-18 DIAGNOSIS — R112 Nausea with vomiting, unspecified: Secondary | ICD-10-CM | POA: Diagnosis not present

## 2016-06-18 DIAGNOSIS — R1084 Generalized abdominal pain: Secondary | ICD-10-CM | POA: Insufficient documentation

## 2016-06-18 DIAGNOSIS — R0981 Nasal congestion: Secondary | ICD-10-CM | POA: Diagnosis not present

## 2016-06-18 DIAGNOSIS — R202 Paresthesia of skin: Secondary | ICD-10-CM | POA: Diagnosis not present

## 2016-06-18 DIAGNOSIS — Z3A18 18 weeks gestation of pregnancy: Secondary | ICD-10-CM | POA: Insufficient documentation

## 2016-06-18 LAB — COMPREHENSIVE METABOLIC PANEL
ALT: 13 U/L — ABNORMAL LOW (ref 14–54)
ANION GAP: 7 (ref 5–15)
AST: 22 U/L (ref 15–41)
Albumin: 3.5 g/dL (ref 3.5–5.0)
Alkaline Phosphatase: 103 U/L (ref 38–126)
BILIRUBIN TOTAL: 0.6 mg/dL (ref 0.3–1.2)
CHLORIDE: 104 mmol/L (ref 101–111)
CO2: 21 mmol/L — AB (ref 22–32)
Calcium: 9 mg/dL (ref 8.9–10.3)
Creatinine, Ser: 0.51 mg/dL (ref 0.44–1.00)
GFR calc Af Amer: >60 mL/min (ref >60)
GFR calc non Af Amer: >60 mL/min (ref >60)
GLUCOSE: 93 mg/dL (ref 65–99)
POTASSIUM: 3.4 mmol/L — AB (ref 3.5–5.1)
SODIUM: 132 mmol/L — AB (ref 135–145)
TOTAL PROTEIN: 6.7 g/dL (ref 6.5–8.1)

## 2016-06-18 LAB — URINALYSIS, ROUTINE W REFLEX MICROSCOPIC
BILIRUBIN URINE: NEGATIVE
Glucose, UA: NEGATIVE mg/dL
Ketones, ur: NEGATIVE mg/dL
Nitrite: NEGATIVE
PROTEIN: NEGATIVE mg/dL
SPECIFIC GRAVITY, URINE: 1.016 (ref 1.005–1.030)
pH: 6 (ref 5.0–8.0)

## 2016-06-18 LAB — CBC
HEMATOCRIT: 33.2 % — AB (ref 36.0–46.0)
HEMOGLOBIN: 11.3 g/dL — AB (ref 12.0–15.0)
MCH: 31.7 pg (ref 26.0–34.0)
MCHC: 34 g/dL (ref 30.0–36.0)
MCV: 93 fL (ref 78.0–100.0)
Platelets: 281 10*3/uL (ref 150–400)
RBC: 3.57 MIL/uL — ABNORMAL LOW (ref 3.87–5.11)
RDW: 13.1 % (ref 11.5–15.5)
WBC: 9.5 10*3/uL (ref 4.0–10.5)

## 2016-06-18 LAB — LIPASE, BLOOD: LIPASE: 21 U/L (ref 11–51)

## 2016-06-18 NOTE — ED Triage Notes (Signed)
Patient reports mid abdominal cramping with emesis this evening , denies fever or diarrhea , she is [redacted] weeks pregnant , no vaginal bleeding or discharge .

## 2016-06-19 ENCOUNTER — Emergency Department (HOSPITAL_COMMUNITY)
Admission: EM | Admit: 2016-06-19 | Discharge: 2016-06-19 | Disposition: A | Discharge status: Home / Self Care | Payer: Medicaid Other | Attending: Emergency Medicine | Admitting: Emergency Medicine

## 2016-06-19 DIAGNOSIS — J069 Acute upper respiratory infection, unspecified: Secondary | ICD-10-CM

## 2016-06-19 DIAGNOSIS — R112 Nausea with vomiting, unspecified: Secondary | ICD-10-CM

## 2016-06-19 DIAGNOSIS — G5603 Carpal tunnel syndrome, bilateral upper limbs: Secondary | ICD-10-CM

## 2016-06-19 LAB — HCG, QUANTITATIVE, PREGNANCY: hCG, Beta Chain, Quant, S: 22248 m[IU]/mL — ABNORMAL HIGH (ref <5)

## 2016-06-19 MED ORDER — METOCLOPRAMIDE HCL 10 MG PO TABS: 10.0000 mg | ORAL_TABLET | Freq: Four times a day (QID) | ORAL | 0 refills | Status: DC | PRN

## 2016-06-19 MED ORDER — ACETAMINOPHEN 500 MG PO TABS
1000.0000 mg | ORAL_TABLET | Freq: Once | ORAL | Status: AC
  Administered 2016-06-19: 1000 mg via ORAL
  Filled 2016-06-19: qty 2

## 2016-06-19 MED ORDER — METOCLOPRAMIDE HCL 10 MG PO TABS
10.0000 mg | ORAL_TABLET | Freq: Once | ORAL | Status: AC
  Administered 2016-06-19: 10 mg via ORAL
  Filled 2016-06-19: qty 1

## 2016-06-19 MED ORDER — AZITHROMYCIN 250 MG PO TABS
500.0000 mg | ORAL_TABLET | Freq: Once | ORAL | Status: AC
  Administered 2016-06-19: 500 mg via ORAL
  Filled 2016-06-19: qty 2

## 2016-06-19 MED ORDER — AZITHROMYCIN 250 MG PO TABS: 250.0000 mg | ORAL_TABLET | Freq: Every day | ORAL | 0 refills | Status: DC

## 2016-06-19 MED ORDER — GUAIFENESIN 100 MG/5ML PO SOLN
10.0000 mL | Freq: Once | ORAL | Status: AC
  Administered 2016-06-19: 200 mg via ORAL
  Filled 2016-06-19: qty 10

## 2016-06-19 NOTE — ED Notes (Signed)
Patient provided with ginger ale, tolerating well. 

## 2016-06-19 NOTE — ED Provider Notes (Signed)
TIME SEEN: 1:59 AM  By signing my name below, I, Margit Banda, attest that this documentation has been prepared under the direction and in the presence of Dellie Piasecki, Delice Bison, DO. Electronically Signed: Margit Banda, ED Scribe. 06/19/16. 2:16 AM.  CHIEF COMPLAINT: multiple complaints  HPI: Dawn Brock is a 31 y.o. female who presents to the Emergency Department complaining of mid abdominal cramping that started in the evening of 06/18/16. Pt is [redacted] weeks pregnant. Associated sx include diffuse throbbing HA, nausea, vomiting, diarrhea, nasal congestion, rhinorrhea, numbness to her fingers, productive cough (yellow sputum) and fatigue. Pt is stressed. States she has a lot going on in her life and at work. She works third shift. She has 2 other children at home. She smokes. No hx of asthma. Pt denies fever, hematuria, dysuria, vaginal bleeding, vaginal discharge, SI and HI. She has not yet felt her baby move with this pregnancy. She is followed by the health department.  She states that she is not having abdominal cramping currently. Nausea vomiting have been present throughout pregnancy. Has not vomited in several days. Diarrhea is described as loose stool without blood or melena.   Patient describes numbness in both of her hands that is worse at night mostly in the first through third digits. No weakness. She denies any midline neck pain but has had some tightness in the left posterior trapezius muscle.   Has not taken any medications prior to arrival. She states she is hoping to find something that will make her feel better.  PCP: Glendale Chard, MD  ROS: See HPI Constitutional: no fever  Eyes: no drainage  ENT: + runny nose, + nasal congestion  Cardiovascular:  no chest pain  Resp: no SOB  GI: + nausea, + vomiting, + abdominal pain GU: no dysuria Integumentary: no rash  Allergy: no hives  Musculoskeletal: no leg swelling  Neurological: no slurred speech ROS otherwise  negative  PAST MEDICAL HISTORY/PAST SURGICAL HISTORY:  Past Medical History:  Diagnosis Date  . Abnormal Pap smear   . Anemia   . Gastritis   . Hx of chlamydia infection   . Vaginal Pap smear, abnormal     MEDICATIONS:  Prior to Admission medications   Medication Sig Start Date End Date Taking? Authorizing Provider  cyclobenzaprine (FLEXERIL) 5 MG tablet Take 1 tablet (5 mg total) by mouth at bedtime. For neck pain. 06/15/16   Mumaw, Lauralyn Primes, DO  Prenatal Vit-Fe Fumarate-FA (PRENATAL MULTIVITAMIN) TABS tablet Take 1 tablet by mouth daily at 12 noon.    [provider]  promethazine (PHENERGAN) 25 MG tablet Take 1 tablet (25 mg total) by mouth every 6 (six) hours as needed for nausea or vomiting. 06/15/16   Mumaw, Lauralyn Primes, DO    ALLERGIES:  Allergies  Allergen Reactions  . Tramadol Nausea And Vomiting    SOCIAL HISTORY:  Social History  Substance Use Topics  . Smoking status: Current Every Day Smoker    Packs/day: 0.25    Years: 14.00    Types: Cigarettes  . Smokeless tobacco: Never Used  . Alcohol use 0.0 oz/week     Comment: Not since June 2014    FAMILY HISTORY: Family History  Problem Relation Age of Onset  . Hypertension Mother   . Fibromyalgia Mother   . Cirrhosis Father   . Kidney disease Father   . Asthma Brother   . Birth defects Son        hirschprungs    EXAM: BP 127/74  Pulse (!) 102   Temp 98.7 F (37.1 C) (Oral)   Resp 16   Ht 5\' 7"  (1.702 m)   Wt 214 lb (97.1 kg)   LMP 02/10/2016   SpO2 98%   BMI 33.52 kg/m    CONSTITUTIONAL: Alert and oriented and responds appropriately to questions. Well-appearing; well-nourished; Afebrile and nontoxic HEAD: Normocephalic EYES: Conjunctivae clear, pupils appear equal, EOMI ENT: normal nose; moist mucous membranes; No pharyngeal erythema or petechiae, no tonsillar hypertrophy or exudate, no uvular deviation, no unilateral swelling, no trismus or drooling, no muffled voice,  normal phonation, no stridor, no dental caries present, no drainable dental abscess noted, no Ludwig's angina, tongue sits flat in the bottom of the mouth, no angioedema, no facial erythema or warmth, no facial swelling; no pain with movement of the neck. NECK: Supple, no meningismus, no nuchal rigidity, no LAD, no midline spinal tenderness step-off or deformity, patient does have some tenderness over the left trapezius muscle with associated spasm but no erythema, warmth, swelling or ecchymosis over this area  CARD: RRR; S1 and S2 appreciated; no murmurs, no clicks, no rubs, no gallops RESP: Normal chest excursion without splinting or tachypnea; breath sounds clear and equal bilaterally; patient does have some very mild wheezing is heard more in the right upper and lower lobe compared to the left, no rhonchi, no rales, no hypoxia or respiratory distress, speaking full sentences, good aeration ABD/GI: Normal bowel sounds; non-distended; soft, non-tender, no rebound, no guarding, no peritoneal signs, no hepatosplenomegaly, gravid uterus BACK:  The back appears normal and is non-tender to palpation, there is no CVA tenderness EXT: Normal ROM in all joints; non-tender to palpation; no edema; normal capillary refill; no cyanosis, no calf tenderness or swelling; 2+ radial and DP pulses bilaterally, compartments are soft, no bony tenderness or bony deformity on exam  SKIN: Normal color for age and race; warm; no rash NEURO: Moves all extremities equally, Strength 5/5 in all four extremities.  Normal sensation diffusely.  CN 2-12 grossly intact.  No dysmetria to finger to nose testing bilaterally.  Normal speech.  Normal gait. PSYCH: The patient's mood and manner are appropriate. Grooming and personal hygiene are appropriate. She denies SI or HI but does appear anxious and upset.  MEDICAL DECISION MAKING: Patient here with multiple different complaints. She complains of URI symptoms and have discussed with her  that this could be pneumonia given I hear some asymmetric wheezing on the right side. We have discussed risk and benefits of chest x-ray and because she is pregnant she would like to hold off at this time I feel this is reasonable. We'll discharge with azithromycin. Her wheezing does improve when she coughs or takes a big deep breath. I do not feel she needs albuterol inhaler, steroid spray she has a history of asthma or COPD. I have recommended that she stop smoking.  She is requesting something to help with her sinus congestion, cough. We have given her guaifenesin here and have discussed that she can use some medications over-the-counter but she should always talk to the pharmacist first to make sure she is not getting a medication that has accommodation of drugs that would be unsafe in pregnancy. Tylenol at home.   Also reports nausea, vomiting and diarrhea. Her abdominal exam is completely benign. She has normal fetal heart and with a rate of 154. No vaginal bleeding, discharge, dysuria or hematuria.  Her abdominal labs are unremarkable including normal LFTs, lipase, creatinine and no leukocytosis. Doubt colitis,  diverticulitis, bowel obstruction, appendicitis, cholecystitis. She is not having any GU symptoms and is followed by the health department. She reports intermittent nausea and vomiting throughout this entire pregnancy. No vomiting over the past several days and she appears well-hydrated. We'll give Reglan and by mouth challenge. She describes loose stools but no watery stool, bloody stool or melena. I do not feel she needs emergent imaging of her abdomen. Her urine shows moderate leukocytes but 0-5 red blood cells 0-5 white blood cells and rare bacteria with many squamous cells. Suspect dirty catch. I do not feel this needs to be treated at this time.  Patient also complaining of 2 years worth of numbness intermittently in the first 3 digits bilaterally. States worse at night. She is not having a  focal neurologic deficits currently. Her extremities are warm and well-perfused. I suspect that this is carpal tunnel. We have given her bilateral Velcro sprints that she states this has helped her significantly. I do not think this is cervical myelopathy, spinal stenosis, epidural abscess or hematoma, discitis, transverse myelitis. I do not feel she needs emergent imaging of her spine. She does have some tenderness over the left trapezius muscle that she has had for several months as well. Recommended massage, heat, Tylenol. Discussed with her that at times carpal tunnel can get worse in pregnancy have recommended orthopedic follow-up. She has no weakness in her hands at this time, no muscle atrophy and again no numbness currently.  I feel a lot of her symptoms are contributed by the fact that she seems very stressed especially about her family and work life. Have offered her work note so that she can get some rest for the next few days and she feels very happy with this plan. She adamantly denies any SI or to me and states that she has to much to live for including her 2 children at home. Have recommended close outpatient follow-up with her primary care physician for this.  At this time, I do not feel there is any life-threatening condition present. I have reviewed and discussed all results (EKG, imaging, lab, urine as appropriate) and exam findings with patient/family. I have reviewed nursing notes and appropriate previous records.  I feel the patient is safe to be discharged home without further emergent workup and can continue workup as an outpatient as needed. Discussed usual and customary return precautions. Patient/family verbalize understanding and are comfortable with this plan.  Outpatient follow-up has been provided if needed. All questions have been answered.   I personally performed the services described in this documentation, which was scribed in my presence. The recorded information has been  reviewed and is accurate.     Anzlee Hinesley, Delice Bison, DO 06/19/16 [phone]

## 2016-06-19 NOTE — ED Notes (Signed)
OB RRN aware of patient and her complaint.

## 2016-06-19 NOTE — ED Notes (Signed)
Patient states she has a lot going on, is stressed, has not been getting a lot of sleep, has been working a lot, "eating crappy foods," etc. She states she has the intermittent fatigue and nausea from her pregnancy, but seems to be having more difficulties with this one. She also c/o intermittent abdominal cramping and head cold symptoms (ear pressure, sinus pressure, runny nose, headaches).

## 2016-06-19 NOTE — Discharge Instructions (Signed)
You may take Tylenol 1000 mg every 6 hours as needed for pain. Many over-the-counter medications can be unsafe in pregnancy. I recommend before you purchase anything for your upper respiratory infection, you talk to the pharmacist.

## 2016-06-22 ENCOUNTER — Other Ambulatory Visit: Payer: Self-pay

## 2016-06-26 ENCOUNTER — Other Ambulatory Visit: Payer: Self-pay

## 2016-07-03 ENCOUNTER — Other Ambulatory Visit: Payer: Self-pay | Admitting: Obstetrics & Gynecology

## 2016-07-03 DIAGNOSIS — Z363 Encounter for antenatal screening for malformations: Secondary | ICD-10-CM

## 2016-07-06 ENCOUNTER — Emergency Department (HOSPITAL_COMMUNITY)
Admission: EM | Admit: 2016-07-06 | Discharge: 2016-07-06 | Disposition: A | Discharge status: Home / Self Care | Payer: Medicaid Other | Attending: Emergency Medicine | Admitting: Emergency Medicine

## 2016-07-06 ENCOUNTER — Encounter (HOSPITAL_COMMUNITY): Payer: Self-pay | Admitting: *Deleted

## 2016-07-06 DIAGNOSIS — L02412 Cutaneous abscess of left axilla: Secondary | ICD-10-CM | POA: Insufficient documentation

## 2016-07-06 DIAGNOSIS — F17210 Nicotine dependence, cigarettes, uncomplicated: Secondary | ICD-10-CM | POA: Diagnosis not present

## 2016-07-06 MED ORDER — CLINDAMYCIN HCL 150 MG PO CAPS
300.0000 mg | ORAL_CAPSULE | Freq: Once | ORAL | Status: AC
  Administered 2016-07-06: 300 mg via ORAL
  Filled 2016-07-06: qty 2

## 2016-07-06 MED ORDER — LIDOCAINE HCL 2 % IJ SOLN
10.0000 mL | Freq: Once | INTRAMUSCULAR | Status: AC
  Administered 2016-07-06: 200 mg via INTRADERMAL
  Filled 2016-07-06: qty 20

## 2016-07-06 MED ORDER — CLINDAMYCIN HCL 150 MG PO CAPS: 300.0000 mg | ORAL_CAPSULE | Freq: Four times a day (QID) | ORAL | 0 refills | Status: DC

## 2016-07-06 MED ORDER — ACETAMINOPHEN 325 MG PO TABS
650.0000 mg | ORAL_TABLET | Freq: Once | ORAL | Status: AC
  Administered 2016-07-06: 650 mg via ORAL
  Filled 2016-07-06: qty 2

## 2016-07-06 MED ORDER — HYDROCODONE-ACETAMINOPHEN 5-325 MG PO TABS: 1.0000 | ORAL_TABLET | Freq: Four times a day (QID) | ORAL | 0 refills | Status: DC | PRN

## 2016-07-06 NOTE — Discharge Instructions (Signed)
Warm compresses several times a day. Take tylenol for pain. Norco for severe pain only. Make sure to take antibiotics until all gone. Return in two days for recheck and packing removal

## 2016-07-06 NOTE — ED Triage Notes (Signed)
To ED for eval of abscess under left arm for past 3 days. Trying hot compresses without relief. Hx of same

## 2016-07-06 NOTE — ED Provider Notes (Signed)
[facility] DEPT Provider Note   CSN: [id number] Arrival date & time: 07/06/16  1013  By signing my name below, I, Mayer Masker, attest that this documentation has been prepared under the direction and in the presence of Lamond Glantz. Electronically Signed: Mayer Masker, Scribe. 07/06/16. 11:04 AM.  History   Chief Complaint Chief Complaint  Patient presents with  . Abscess   The history is provided by the patient. No language interpreter was used.    HPI Comments: Dawn Brock is a 31 y.o. female who is 4 months pregnant who presents to the Emergency Department complaining of constant, gradually worsening left axilla pain that began 4 days ago. She has had this issue before with previous I&D's done. She has tried using a hot compress and hydrogen peroxide with no relief.   Past Medical History:  Diagnosis Date  . Abnormal Pap smear   . Anemia   . Gastritis   . Hx of chlamydia infection   . Vaginal Pap smear, abnormal     Patient Active Problem List   Diagnosis Date Noted  . Cocaine abuse complicating pregnancy 18/56/3149  . Alcohol dependence ([place]) 03/12/2012  . Cannabis dependence ([place]) 03/12/2012  . Executive function deficit 03/11/2012    Class: Acute  . Cigarette smoker 03/10/2012  . Duodenitis with nausea, vomiting, diarrhea and abdominal pain 03/10/2012  . Alcohol abuse 03/10/2012    Past Surgical History:  Procedure Laterality Date  . HERNIA REPAIR     As an infant  . LEEP      OB History    Gravida Para Term Preterm AB Living   4 3 3  0 0 3   SAB TAB Ectopic Multiple Live Births   0 0 0 0 3       Home Medications    Prior to Admission medications   Medication Sig Start Date End Date Taking? Authorizing Provider  azithromycin (ZITHROMAX) 250 MG tablet Take 1 tablet (250 mg total) by mouth daily. Start evening of 06/19/16 06/19/16   Ward, Delice Bison, DO  cyclobenzaprine (FLEXERIL) 5 MG tablet Take 1 tablet (5 mg total) by mouth at  bedtime. For neck pain. 06/15/16   Mumaw, Lauralyn Primes, DO  metoCLOPramide (REGLAN) 10 MG tablet Take 1 tablet (10 mg total) by mouth every 6 (six) hours as needed for nausea. 06/19/16   Ward, Delice Bison, DO  Prenatal Vit-Fe Fumarate-FA (PRENATAL MULTIVITAMIN) TABS tablet Take 1 tablet by mouth daily at 12 noon.    [provider]  promethazine (PHENERGAN) 25 MG tablet Take 1 tablet (25 mg total) by mouth every 6 (six) hours as needed for nausea or vomiting. 06/15/16   Mumaw, Lauralyn Primes, DO    Family History Family History  Problem Relation Age of Onset  . Hypertension Mother   . Fibromyalgia Mother   . Cirrhosis Father   . Kidney disease Father   . Asthma Brother   . Birth defects Son        hirschprungs    Social History Social History  Substance Use Topics  . Smoking status: Current Every Day Smoker    Packs/day: 0.25    Years: 14.00    Types: Cigarettes  . Smokeless tobacco: Never Used  . Alcohol use 0.0 oz/week     Comment: Not since June 2014     Allergies   Tramadol   Review of Systems Review of Systems  Constitutional: Negative for fever.  Skin: Positive for wound.     Physical  Exam Updated Vital Signs BP (!) 131/95 (BP Location: Right Arm)   Pulse (!) 103   Temp 98.3 F (36.8 C) (Oral)   Resp 20   LMP 02/10/2016   SpO2 98%   Physical Exam  Constitutional: She is oriented to person, place, and time. She appears well-developed and well-nourished.  HENT:  Head: Normocephalic and atraumatic.  Cardiovascular: Normal rate.   Pulmonary/Chest: Effort normal.  Neurological: She is alert and oriented to person, place, and time.  Skin: Skin is warm and dry.  Multiple possibly communicating abscesses to the left axilla. There is surrounding skin erythema, swelling, ttp. Very tender. Area of induration and fluctuance measuring 8x4cm.   Psychiatric: She has a normal mood and affect.  Nursing note and vitals reviewed.    ED Treatments /  Results  DIAGNOSTIC STUDIES: Oxygen Saturation is 98% on RA, normal by my interpretation.    COORDINATION OF CARE: 11:04 AM Discussed treatment plan with pt at bedside and pt agreed to plan.  Labs (all labs ordered are listed, but only abnormal results are displayed) Labs Reviewed - No data to display  EKG  EKG Interpretation None       Radiology No results found.  Procedures Procedures (including critical care time)  INCISION AND DRAINAGE Performed by: Jeannett Senior A Consent: Verbal consent obtained. Risks and benefits: risks, benefits and alternatives were discussed Type: abscess  Body area: left axilla  Anesthesia: local infiltration  Incision was made with a scalpel. - 2 incisions  Local anesthetic: lidocaine 2% wo epinephrine  Anesthetic total: 103 ml  Complexity: complex Blunt dissection to break up loculations  Drainage: purulent  Drainage amount: large  Packing material: 1/4 in iodoform gauze  Patient tolerance: Patient tolerated the procedure well with no immediate complications.    Medications Ordered in ED Medications - No data to display   Initial Impression / Assessment and Plan / ED Course  I have reviewed the triage vital signs and the nursing notes.  Pertinent labs & imaging results that were available during my care of the patient were reviewed by me and considered in my medical decision making (see chart for details).     Patient with multiple possibly communicating abscesses to the left axilla. Question hydradenitis. Patient is very tender, exam and incision and drainage were very difficult due to patient screaming, moving away, crying, hyperventilating, stating she is going to get sick. I was able to incise and drain to the abscesses. After draining the first, was unable to track the abscess up to another indurated area due to patient still having pain even after thoroughly numbing the area. I was able to make another incision  just above the original incision and drain some more purulence. Both areas packed. Advised to apply warm compresses. Will prescribe clindamycin for infection. I will give her 6 tablets of Norco for severe pain only. Follow-up here in 2 days due to extensive abscess and cellulitis. Return sooner if worsening rapidly.  Vitals:   07/06/16 1022 07/06/16 1228  BP: (!) 131/95 124/73  Pulse: (!) 103 87  Resp: 20 20  Temp: 98.3 F (36.8 C)   TempSrc: Oral   SpO2: 98% 100%     Final Clinical Impressions(s) / ED Diagnoses   Final diagnoses:  Abscess of left axilla    New Prescriptions New Prescriptions   CLINDAMYCIN (CLEOCIN) 150 MG CAPSULE    Take 2 capsules (300 mg total) by mouth every 6 (six) hours.   HYDROCODONE-ACETAMINOPHEN (NORCO) 9-735  MG TABLET    Take 1 tablet by mouth every 6 (six) hours as needed for moderate pain.   I personally performed the services described in this documentation, which was scribed in my presence. The recorded information has been reviewed and is accurate.     Jeannett Senior, PA-C 07/06/16 [place], MD 07/06/16 2217

## 2016-07-07 ENCOUNTER — Ambulatory Visit (HOSPITAL_COMMUNITY): Payer: Medicaid Other

## 2016-07-08 ENCOUNTER — Encounter (HOSPITAL_COMMUNITY): Payer: Self-pay | Admitting: Emergency Medicine

## 2016-07-08 ENCOUNTER — Emergency Department (HOSPITAL_COMMUNITY)
Admission: EM | Admit: 2016-07-08 | Discharge: 2016-07-08 | Disposition: A | Discharge status: Home / Self Care | Payer: Medicaid Other | Attending: Emergency Medicine | Admitting: Emergency Medicine

## 2016-07-08 DIAGNOSIS — Z3A20 20 weeks gestation of pregnancy: Secondary | ICD-10-CM | POA: Diagnosis not present

## 2016-07-08 DIAGNOSIS — F17210 Nicotine dependence, cigarettes, uncomplicated: Secondary | ICD-10-CM | POA: Insufficient documentation

## 2016-07-08 DIAGNOSIS — Z79899 Other long term (current) drug therapy: Secondary | ICD-10-CM | POA: Diagnosis not present

## 2016-07-08 DIAGNOSIS — L02412 Cutaneous abscess of left axilla: Secondary | ICD-10-CM | POA: Insufficient documentation

## 2016-07-08 DIAGNOSIS — L02419 Cutaneous abscess of limb, unspecified: Secondary | ICD-10-CM

## 2016-07-08 MED ORDER — ACETAMINOPHEN 500 MG PO TABS
1000.0000 mg | ORAL_TABLET | Freq: Once | ORAL | Status: AC
  Administered 2016-07-08: 1000 mg via ORAL
  Filled 2016-07-08: qty 2

## 2016-07-08 MED ORDER — LIDOCAINE HCL (PF) 1 % IJ SOLN
30.0000 mL | Freq: Once | INTRAMUSCULAR | Status: AC
  Administered 2016-07-08: 30 mL
  Filled 2016-07-08: qty 30

## 2016-07-08 MED ORDER — MORPHINE SULFATE (PF) 4 MG/ML IV SOLN
2.0000 mg | Freq: Once | INTRAVENOUS | Status: AC
  Administered 2016-07-08: 2 mg via INTRAVENOUS
  Filled 2016-07-08: qty 1

## 2016-07-08 MED ORDER — CLINDAMYCIN PHOSPHATE 600 MG/50ML IV SOLN
600.0000 mg | Freq: Once | INTRAVENOUS | Status: AC
  Administered 2016-07-08: 600 mg via INTRAVENOUS
  Filled 2016-07-08: qty 50

## 2016-07-08 MED ORDER — MORPHINE SULFATE (PF) 4 MG/ML IV SOLN
4.0000 mg | Freq: Once | INTRAVENOUS | Status: AC
  Administered 2016-07-08: 4 mg via INTRAVENOUS
  Filled 2016-07-08: qty 1

## 2016-07-08 MED ORDER — HYDROCODONE-ACETAMINOPHEN 5-325 MG PO TABS: 1.0000 | ORAL_TABLET | Freq: Four times a day (QID) | ORAL | 0 refills | Status: DC | PRN

## 2016-07-08 MED ORDER — ONDANSETRON HCL 4 MG/2ML IJ SOLN
4.0000 mg | Freq: Once | INTRAMUSCULAR | Status: AC
  Administered 2016-07-08: 4 mg via INTRAVENOUS
  Filled 2016-07-08: qty 2

## 2016-07-08 NOTE — ED Notes (Signed)
Took patient iv out patient is ready to go

## 2016-07-08 NOTE — ED Provider Notes (Signed)
[facility] DEPT Provider Note   CSN: [id number] Arrival date & time: 07/08/16  1145  By signing my name below, I, Collene Leyden, attest that this documentation has been prepared under the direction and in the presence of Ocie Cornfield, PA-C. Electronically Signed: Collene Leyden, Scribe. 07/08/16. 12:52 PM.  History   Chief Complaint Chief Complaint  Patient presents with  . packing recheck    HPI Comments: Dawn Brock is a 31 y.o. female with no pertinent past medical history, who presents to the Emergency Department for removal of packing to the axilla, which was placed 2 days ago. Patient states she developed an abscess to her left axilla, she had an incision and drainage procedure, which she did not tolerate well. Prior Provider note notes that they did get significant amount of purulent drainage. Abscess was packed. Follow-up in 2 days in the ER. Patient is here for removal of packing. Patient is noted to be pregnant. Patient has been compliant with antibiotics and Norco. Patient reports associated pain and discharge. States the pain has continued. She does not note any further purulent drainage. Patient denies any fever, chills, or any additional symptoms.   The history is provided by the patient. No language interpreter was used.    Past Medical History:  Diagnosis Date  . Abnormal Pap smear   . Anemia   . Gastritis   . Hx of chlamydia infection   . Vaginal Pap smear, abnormal     Patient Active Problem List   Diagnosis Date Noted  . Cocaine abuse complicating pregnancy 99/83/3825  . Alcohol dependence ([place]) 03/12/2012  . Cannabis dependence ([place]) 03/12/2012  . Executive function deficit 03/11/2012    Class: Acute  . Cigarette smoker 03/10/2012  . Duodenitis with nausea, vomiting, diarrhea and abdominal pain 03/10/2012  . Alcohol abuse 03/10/2012    Past Surgical History:  Procedure Laterality Date  . HERNIA REPAIR     As an infant  . LEEP       OB History    Gravida Para Term Preterm AB Living   4 3 3  0 0 3   SAB TAB Ectopic Multiple Live Births   0 0 0 0 3       Home Medications    Prior to Admission medications   Medication Sig Start Date End Date Taking? Authorizing Provider  azithromycin (ZITHROMAX) 250 MG tablet Take 1 tablet (250 mg total) by mouth daily. Start evening of 06/19/16 06/19/16   Ward, Delice Bison, DO  clindamycin (CLEOCIN) 150 MG capsule Take 2 capsules (300 mg total) by mouth every 6 (six) hours. 07/06/16   Kirichenko, Lahoma Rocker, PA-C  cyclobenzaprine (FLEXERIL) 5 MG tablet Take 1 tablet (5 mg total) by mouth at bedtime. For neck pain. 06/15/16   Mumaw, Lauralyn Primes, DO  HYDROcodone-acetaminophen (NORCO) 5-325 MG tablet Take 1 tablet by mouth every 6 (six) hours as needed for moderate pain. 07/06/16   Kirichenko, Lahoma Rocker, PA-C  metoCLOPramide (REGLAN) 10 MG tablet Take 1 tablet (10 mg total) by mouth every 6 (six) hours as needed for nausea. 06/19/16   Ward, Delice Bison, DO  Prenatal Vit-Fe Fumarate-FA (PRENATAL MULTIVITAMIN) TABS tablet Take 1 tablet by mouth daily at 12 noon.    [provider]  promethazine (PHENERGAN) 25 MG tablet Take 1 tablet (25 mg total) by mouth every 6 (six) hours as needed for nausea or vomiting. 06/15/16   Mumaw, Lauralyn Primes, DO    Family History Family History  Problem Relation Age of  Onset  . Hypertension Mother   . Fibromyalgia Mother   . Cirrhosis Father   . Kidney disease Father   . Asthma Brother   . Birth defects Son        hirschprungs    Social History Social History  Substance Use Topics  . Smoking status: Current Every Day Smoker    Packs/day: 0.25    Years: 14.00    Types: Cigarettes  . Smokeless tobacco: Never Used  . Alcohol use 0.0 oz/week     Comment: Not since June 2014     Allergies   Tramadol   Review of Systems Review of Systems  Constitutional: Negative for chills and fever.  Gastrointestinal: Negative for nausea and  vomiting.  Skin: Positive for wound (abscess to the left axilla).  Neurological: Negative for weakness and numbness.     Physical Exam Updated Vital Signs BP 129/71 (BP Location: Right Arm)   Pulse 97   Temp 97.7 F (36.5 C) (Oral)   Resp 16   LMP 02/10/2016   SpO2 100%   Physical Exam  Constitutional: She is oriented to person, place, and time. She appears well-developed and well-nourished. No distress.  Patient is very tearful in the room.   HENT:  Head: Normocephalic and atraumatic.  Eyes: EOM are normal. Pupils are equal, round, and reactive to light.  Neck: Normal range of motion. Neck supple.  Cardiovascular: Normal rate and regular rhythm.   Pulmonary/Chest: Effort normal and breath sounds normal.  Musculoskeletal: Normal range of motion.  Neurological: She is alert and oriented to person, place, and time.     Skin: Skin is warm and dry.  Multiple possibly communicating abscesses to the left axilla. There is surrounding, swelling, ttp. Very tender. Area of induration  measuring 8x4cm.  Two incision noted with packing purulent discharge. No surrounding erythema. Do not appreciate significant fluctuance.  Psychiatric: She has a normal mood and affect.  Nursing note and vitals reviewed.    ED Treatments / Results  DIAGNOSTIC STUDIES: Oxygen Saturation is 100% on RA, normal by my interpretation.    COORDINATION OF CARE: 12:51 PM Discussed treatment plan with pt at bedside and pt agreed to plan, which includes packing removal and replacement.   Labs (all labs ordered are listed, but only abnormal results are displayed) Labs Reviewed - No data to display  EKG  EKG Interpretation None       Radiology No results found.  Procedures Procedures (including critical care time) INCISION AND DRAINAGE Performed by: Ocie Cornfield Consent: Verbal consent obtained. Risks and benefits: risks, benefits and alternatives were discussed Type: abscess  Body area:  Left axilla  Anesthesia: local infiltration  Incision was made with a scalpel.  Local anesthetic: lidocaine 2% wo epinephrine  Anesthetic total: 10 ml  Complexity: complex Blunt dissection to break up loculations  Drainage: minimal purulent, bloody  Drainage amount: minimal  Packing material: no further packing  Patient tolerance: Patient tolerated the procedure well with no immediate complications.    Medications Ordered in ED Medications  acetaminophen (TYLENOL) tablet 1,000 mg (1,000 mg Oral Given 07/08/16 1256)  morphine 4 MG/ML injection 4 mg (4 mg Intravenous Given 07/08/16 1410)  ondansetron (ZOFRAN) injection 4 mg (4 mg Intravenous Given 07/08/16 1410)  clindamycin (CLEOCIN) IVPB 600 mg (0 mg Intravenous Stopped 07/08/16 1454)  lidocaine (PF) (XYLOCAINE) 1 % injection 30 mL (30 mLs Infiltration Given 07/08/16 1415)  morphine 4 MG/ML injection 2 mg (2 mg Intravenous Given 07/08/16 1501)  Initial Impression / Assessment and Plan / ED Course  I have reviewed the triage vital signs and the nursing notes.  Pertinent labs & imaging results that were available during my care of the patient were reviewed by me and considered in my medical decision making (see chart for details).     Patient presents for recheck of abscess to the left axilla. Wants packing removed. Patient states the pain has continued. She is very tearful in the room. She is crying and hyperventilating. Do not appreciate any further fluctuance of the area. However this does seem to be multiple abscesses. 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 Patient vital signs are stable. She is afebrile. Did have Dr. Gilford Raid assess the wound. She recommends further incision and drainage with IV antibiotics and IV pain medicine. Patient is [redacted] weeks pregnant. She recommends IV Clinda, morphine. Pt with difficulty tolerating procedure. Do not get any further expression of purulent drainage. Dr.  Gilford Raid at bedside and she performed further probing and did not have any purulent return. Area seems more indurated. We will continue her clindamycin at home. Did speak with PA Jerene Pitch with general surgery who has scheduled an appointment for patient in the office on Friday. Pt is hemodynamically stable, in NAD, & able to ambulate in the ED. Evaluation does not show pathology that would require ongoing emergent intervention or inpatient treatment. I explained the diagnosis to the patient. Pain has been managed & has no complaints prior to dc. Pt is comfortable with above plan and is stable for discharge at this time. All questions were answered prior to disposition. Strict return precautions for f/u to the ED were discussed.    Final Clinical Impressions(s) / ED Diagnoses   Final diagnoses:  Axillary abscess    New Prescriptions Discharge Medication List as of 07/08/2016  3:35 PM     I personally performed the services described in this documentation, which was scribed in my presence. The recorded information has been reviewed and is accurate.    Doristine Devoid, PA-C 07/08/16 1654    Isla Pence, MD 07/11/16 [phone]

## 2016-07-08 NOTE — Discharge Instructions (Signed)
Warm compresses. Take the medicine as needed. Continue taking her antibiotics. Follow-up with the surgical appointment on Friday.

## 2016-07-08 NOTE — ED Triage Notes (Signed)
Pt here to have packing removed from left axilla.

## 2016-07-17 ENCOUNTER — Other Ambulatory Visit: Payer: Self-pay | Admitting: Obstetrics & Gynecology

## 2016-07-17 ENCOUNTER — Encounter: Payer: Self-pay | Admitting: Obstetrics & Gynecology

## 2016-07-17 DIAGNOSIS — O280 Abnormal hematological finding on antenatal screening of mother: Secondary | ICD-10-CM

## 2016-07-21 ENCOUNTER — Encounter (HOSPITAL_COMMUNITY): Payer: Self-pay

## 2016-07-21 ENCOUNTER — Ambulatory Visit (HOSPITAL_COMMUNITY): Payer: Medicaid Other

## 2016-07-21 ENCOUNTER — Ambulatory Visit (HOSPITAL_COMMUNITY)
Admission: RE | Admit: 2016-07-21 | Discharge: 2016-07-21 | Disposition: A | Discharge status: Home / Self Care | Payer: Medicaid Other | Source: Ambulatory Visit | Attending: Obstetrics & Gynecology | Admitting: Obstetrics & Gynecology

## 2016-07-29 ENCOUNTER — Other Ambulatory Visit (HOSPITAL_COMMUNITY): Payer: Self-pay

## 2016-07-29 ENCOUNTER — Encounter (HOSPITAL_COMMUNITY): Payer: Self-pay

## 2016-08-21 ENCOUNTER — Inpatient Hospital Stay (HOSPITAL_COMMUNITY)
Admission: AD | Admit: 2016-08-21 | Discharge: 2016-08-22 | Disposition: A | Discharge status: Home / Self Care | Payer: Medicaid Other | Source: Ambulatory Visit | Attending: Obstetrics & Gynecology | Admitting: Obstetrics & Gynecology

## 2016-08-21 ENCOUNTER — Inpatient Hospital Stay (HOSPITAL_COMMUNITY): Payer: Medicaid Other

## 2016-08-21 ENCOUNTER — Encounter (HOSPITAL_COMMUNITY): Payer: Self-pay | Admitting: *Deleted

## 2016-08-21 DIAGNOSIS — F1210 Cannabis abuse, uncomplicated: Secondary | ICD-10-CM | POA: Clinically undetermined

## 2016-08-21 DIAGNOSIS — M545 Low back pain, unspecified: Secondary | ICD-10-CM

## 2016-08-21 DIAGNOSIS — K219 Gastro-esophageal reflux disease without esophagitis: Secondary | ICD-10-CM

## 2016-08-21 DIAGNOSIS — G44209 Tension-type headache, unspecified, not intractable: Secondary | ICD-10-CM

## 2016-08-21 DIAGNOSIS — R519 Headache, unspecified: Secondary | ICD-10-CM | POA: Diagnosis present

## 2016-08-21 DIAGNOSIS — F1410 Cocaine abuse, uncomplicated: Secondary | ICD-10-CM | POA: Insufficient documentation

## 2016-08-21 DIAGNOSIS — R51 Headache: Secondary | ICD-10-CM | POA: Diagnosis present

## 2016-08-21 DIAGNOSIS — O26892 Other specified pregnancy related conditions, second trimester: Secondary | ICD-10-CM | POA: Insufficient documentation

## 2016-08-21 DIAGNOSIS — Z3A27 27 weeks gestation of pregnancy: Secondary | ICD-10-CM | POA: Diagnosis not present

## 2016-08-21 DIAGNOSIS — O99322 Drug use complicating pregnancy, second trimester: Secondary | ICD-10-CM | POA: Diagnosis not present

## 2016-08-21 DIAGNOSIS — Z9889 Other specified postprocedural states: Secondary | ICD-10-CM

## 2016-08-21 DIAGNOSIS — O9989 Other specified diseases and conditions complicating pregnancy, childbirth and the puerperium: Secondary | ICD-10-CM | POA: Diagnosis not present

## 2016-08-21 DIAGNOSIS — R220 Localized swelling, mass and lump, head: Secondary | ICD-10-CM

## 2016-08-21 DIAGNOSIS — F1911 Other psychoactive substance abuse, in remission: Secondary | ICD-10-CM

## 2016-08-21 DIAGNOSIS — O99332 Smoking (tobacco) complicating pregnancy, second trimester: Secondary | ICD-10-CM | POA: Diagnosis not present

## 2016-08-21 DIAGNOSIS — O99612 Diseases of the digestive system complicating pregnancy, second trimester: Secondary | ICD-10-CM | POA: Diagnosis not present

## 2016-08-21 DIAGNOSIS — O0932 Supervision of pregnancy with insufficient antenatal care, second trimester: Secondary | ICD-10-CM

## 2016-08-21 LAB — COMPREHENSIVE METABOLIC PANEL
ALBUMIN: 3.4 g/dL — AB (ref 3.5–5.0)
ALT: 7 U/L — AB (ref 14–54)
AST: 20 U/L (ref 15–41)
Alkaline Phosphatase: 174 U/L — ABNORMAL HIGH (ref 38–126)
Anion gap: 8 (ref 5–15)
BUN: <5 mg/dL — ABNORMAL LOW (ref 6–20)
CHLORIDE: 105 mmol/L (ref 101–111)
CO2: 21 mmol/L — AB (ref 22–32)
CREATININE: 0.42 mg/dL — AB (ref 0.44–1.00)
Calcium: 9.2 mg/dL (ref 8.9–10.3)
GFR calc non Af Amer: >60 mL/min (ref >60)
Glucose, Bld: 85 mg/dL (ref 65–99)
Potassium: 3.7 mmol/L (ref 3.5–5.1)
SODIUM: 134 mmol/L — AB (ref 135–145)
Total Bilirubin: 0.5 mg/dL (ref 0.3–1.2)
Total Protein: 6.9 g/dL (ref 6.5–8.1)

## 2016-08-21 LAB — CBC WITH DIFFERENTIAL/PLATELET
Basophils Absolute: 0 10*3/uL (ref 0.0–0.1)
Basophils Relative: 0 %
EOS ABS: 0.3 10*3/uL (ref 0.0–0.7)
Eosinophils Relative: 3 %
HCT: 34.6 % — ABNORMAL LOW (ref 36.0–46.0)
Hemoglobin: 11.9 g/dL — ABNORMAL LOW (ref 12.0–15.0)
LYMPHS ABS: 3.2 10*3/uL (ref 0.7–4.0)
LYMPHS PCT: 35 %
MCH: 33 pg (ref 26.0–34.0)
MCHC: 34.4 g/dL (ref 30.0–36.0)
MCV: 95.8 fL (ref 78.0–100.0)
Monocytes Absolute: 0.8 10*3/uL (ref 0.1–1.0)
Monocytes Relative: 9 %
Neutro Abs: 4.8 10*3/uL (ref 1.7–7.7)
Neutrophils Relative %: 53 %
PLATELETS: 268 10*3/uL (ref 150–400)
RBC: 3.61 MIL/uL — AB (ref 3.87–5.11)
RDW: 13.1 % (ref 11.5–15.5)
WBC: 9.1 10*3/uL (ref 4.0–10.5)

## 2016-08-21 LAB — URINALYSIS, ROUTINE W REFLEX MICROSCOPIC
Bilirubin Urine: NEGATIVE
GLUCOSE, UA: NEGATIVE mg/dL
HGB URINE DIPSTICK: NEGATIVE
KETONES UR: 5 mg/dL — AB
Leukocytes, UA: NEGATIVE
Nitrite: NEGATIVE
PROTEIN: NEGATIVE mg/dL
Specific Gravity, Urine: 1.019 (ref 1.005–1.030)
pH: 6 (ref 5.0–8.0)

## 2016-08-21 LAB — RAPID URINE DRUG SCREEN, HOSP PERFORMED
AMPHETAMINES: NOT DETECTED
Barbiturates: NOT DETECTED
Benzodiazepines: NOT DETECTED
Cocaine: POSITIVE — AB
Opiates: NOT DETECTED
Tetrahydrocannabinol: NOT DETECTED

## 2016-08-21 MED ORDER — BUTALBITAL-APAP-CAFFEINE 50-325-40 MG PO TABS
2.0000 | ORAL_TABLET | Freq: Once | ORAL | Status: AC
  Administered 2016-08-21: 2 via ORAL
  Filled 2016-08-21: qty 2

## 2016-08-21 MED ORDER — DEXAMETHASONE SODIUM PHOSPHATE 10 MG/ML IJ SOLN
10.0000 mg | Freq: Once | INTRAMUSCULAR | Status: AC
  Administered 2016-08-22: 10 mg via INTRAVENOUS
  Filled 2016-08-21: qty 1

## 2016-08-21 MED ORDER — METOCLOPRAMIDE HCL 5 MG/ML IJ SOLN
10.0000 mg | Freq: Once | INTRAMUSCULAR | Status: AC
  Administered 2016-08-21: 10 mg via INTRAVENOUS
  Filled 2016-08-21: qty 2

## 2016-08-21 MED ORDER — LACTATED RINGERS IV SOLN
INTRAVENOUS | Status: DC
  Administered 2016-08-21: via INTRAVENOUS

## 2016-08-21 NOTE — MAU Note (Addendum)
Pt reports she has been working 2 jobs. Stated she had a bad back pain and a headache. Feels tired and she is not getting an enough rest. "just don't feel good" Reports good fetal movement. Pt also reports a "knot on the top of her head that she does not recall how she got it.

## 2016-08-21 NOTE — MAU Provider Note (Signed)
Chief Complaint:  Headache and Back Pain   First Provider Initiated Contact with Patient 08/21/16 2208     HPI: Dawn Brock is a 31 y.o. [phone] at 60w4dwho presents to maternity admissions reporting back pain and headache. Has a knot on her head that she is not sure how she got.  Does not remember falling. Does not know how long it has been there. Has had recurrent axillary abscesses. Feels very tired. Works 2 jobs, does not sleep.  Doesn't feel good..persistent numbness in hands and feet, recently evaluated/treated. Still has a lot of stomach acid reflux, out of meds.  She reports good fetal movement, denies LOF, vaginal bleeding, vaginal itching/burning, urinary symptoms, dizziness, n/v, diarrhea, constipation or fever/chills.    Headache   This is a recurrent problem. The current episode started today. The problem occurs constantly. The problem has been unchanged. The pain is located in the bilateral and frontal region. The pain does not radiate. The quality of the pain is described as aching. The pain is moderate. Associated symptoms include back pain and numbness. Pertinent negatives include no abdominal pain, blurred vision, dizziness, fever, muscle aches, nausea, sinus pressure, sore throat, visual change or vomiting. Nothing aggravates the symptoms. She has tried nothing for the symptoms. There is no history of recent head traumas (not sure if she has had any trauma, has lump on head).  Back Pain  This is a recurrent problem. The current episode started 1 to 4 weeks ago. The problem occurs constantly. The problem is unchanged. The pain is present in the lumbar spine. The quality of the pain is described as aching. The pain does not radiate. The pain is moderate. The pain is the same all the time. Stiffness is present all day. Associated symptoms include headaches and numbness. Pertinent negatives include no abdominal pain or fever. She has tried nothing for the symptoms.   Of note,  after patient evaluated, chart review shows long history of alcohol and other substance abuse.   RN Note: Pt reports she has been working 2 jobs. Stated she had a bad back pain and a headache. Feels tired and she is not getting an enough rest. "just don't feel good" Reports good fetal movement. Pt also reports a "knot on the top of her head that she does not recall how she got it.   Past Medical History: Past Medical History:  Diagnosis Date  . Abnormal Pap smear   . Anemia   . Gastritis   . Hx of chlamydia infection     Past obstetric history: OB History  Gravida Para Term Preterm AB Living  4 3 3  0 0 3  SAB TAB Ectopic Multiple Live Births  0 0 0 0 3    # Outcome Date GA Lbr Len/2nd Weight Sex Delivery Anes PTL Lv  4 Current           3 Term 02/22/13 [redacted]w[redacted]d 10:29 / 00:57 8 lb 2 oz (3.685 kg) M Vag-Spont EPI  LIV  2 Term 2008 [redacted]w[redacted]d 10:00 8 lb 6 oz (3.799 kg) M Vag-Spont EPI  LIV     Birth Comments: son has Hirschprungs Disease  1 Term 2006 [redacted]w[redacted]d 07:00 6 lb 8 oz (2.948 kg) F Vag-Spont EPI  LIV      Past Surgical History: Past Surgical History:  Procedure Laterality Date  . HERNIA REPAIR     As an infant  . LEEP      Family History: Family History  Problem Relation  Age of Onset  . Hypertension Mother   . Fibromyalgia Mother   . Cirrhosis Father   . Kidney disease Father   . Asthma Brother   . Birth defects Son        hirschprungs    Social History: Social History  Substance Use Topics  . Smoking status: Current Every Day Smoker    Packs/day: 0.25    Years: 14.00    Types: Cigarettes  . Smokeless tobacco: Never Used  . Alcohol use 0.0 oz/week     Comment: Not since June 2014    Allergies:  Allergies  Allergen Reactions  . Tramadol Nausea And Vomiting    Meds:  Prescriptions Prior to Admission  Medication Sig Dispense Refill Last Dose  . acetaminophen (TYLENOL) 325 MG tablet Take 325 mg by mouth every 6 (six) hours as needed for headache.    08/20/2016 at Unknown time  . clindamycin (CLEOCIN T) 1 % external solution Apply 1 application topically daily.   08/20/2016 at Unknown time  . cyclobenzaprine (FLEXERIL) 5 MG tablet Take 1 tablet (5 mg total) by mouth at bedtime. For neck pain. 15 tablet 0 Past Week at Unknown time  . Prenatal Vit-Fe Fumarate-FA (PRENATAL MULTIVITAMIN) TABS tablet Take 1 tablet by mouth daily at 12 noon.   08/20/2016 at Unknown time  . promethazine (PHENERGAN) 25 MG tablet Take 1 tablet (25 mg total) by mouth every 6 (six) hours as needed for nausea or vomiting. 30 tablet 0 Past Week at Unknown time  . azithromycin (ZITHROMAX) 250 MG tablet Take 1 tablet (250 mg total) by mouth daily. Start evening of 06/19/16 (Patient not taking: Reported on 08/21/2016) 4 tablet 0 Completed Course at Unknown time  . clindamycin (CLEOCIN) 150 MG capsule Take 2 capsules (300 mg total) by mouth every 6 (six) hours. (Patient not taking: Reported on 08/21/2016) 42 capsule 0 Completed Course at Unknown time  . HYDROcodone-acetaminophen (NORCO) 5-325 MG tablet Take 1 tablet by mouth every 6 (six) hours as needed. (Patient not taking: Reported on 08/21/2016) 5 tablet 0 Not Taking at Unknown time  . metoCLOPramide (REGLAN) 10 MG tablet Take 1 tablet (10 mg total) by mouth every 6 (six) hours as needed for nausea. (Patient not taking: Reported on 08/21/2016) 15 tablet 0 Not Taking at Unknown time    I have reviewed patient's Past Medical Hx, Surgical Hx, Family Hx, Social Hx, medications and allergies.   ROS:  Review of Systems  Constitutional: Negative for fever.  HENT: Negative for sinus pressure and sore throat.   Eyes: Negative for blurred vision.  Gastrointestinal: Negative for abdominal pain, nausea and vomiting.  Musculoskeletal: Positive for back pain.  Neurological: Positive for numbness and headaches. Negative for dizziness.   Other systems negative  Physical Exam  Patient Vitals for the past 24 hrs:  BP Temp Temp src Pulse Resp   08/21/16 2151 133/74 98.2 F (36.8 C) Oral 98 18   Constitutional: Well-developed, well-nourished female in no acute distress.  Cardiovascular: normal rate and rhythm Respiratory: normal effort, clear to auscultation bilaterally GI: Abd soft, non-tender, gravid appropriate for gestational age.   No rebound or guarding. MS: Extremities nontender, no edema, normal ROM Neurologic: . MAEW x 4.  Speech slurred.  But is Oriented to person, place, date time, and president. There is a 1cm round lump on top of head, at hairline. ?hematoma vs possible cystic lesion (?abscess). Rubbery, not fluctuant.  Tender. GU: Neg CVAT.  PELVIC EXAM: Cervix long/closed  FHT:  Baseline 140 , moderate variability, accelerations present, no decelerations Contractions: Rare   Labs:    Results for orders placed or performed during the hospital encounter of 08/21/16 (from the past 24 hour(s))  Urinalysis, Routine w reflex microscopic     Status: Abnormal   Collection Time: 08/21/16  9:30 PM  Result Value Ref Range   Color, Urine YELLOW YELLOW   APPearance HAZY (A) CLEAR   Specific Gravity, Urine 1.019 1.005 - 1.030   pH 6.0 5.0 - 8.0   Glucose, UA NEGATIVE NEGATIVE mg/dL   Hgb urine dipstick NEGATIVE NEGATIVE   Bilirubin Urine NEGATIVE NEGATIVE   Ketones, ur 5 (A) NEGATIVE mg/dL   Protein, ur NEGATIVE NEGATIVE mg/dL   Nitrite NEGATIVE NEGATIVE   Leukocytes, UA NEGATIVE NEGATIVE  Urine rapid drug screen (hosp performed)     Status: Abnormal   Collection Time: 08/21/16  9:30 PM  Result Value Ref Range   Opiates NONE DETECTED NONE DETECTED   Cocaine POSITIVE (A) NONE DETECTED   Benzodiazepines NONE DETECTED NONE DETECTED   Amphetamines NONE DETECTED NONE DETECTED   Tetrahydrocannabinol NONE DETECTED NONE DETECTED   Barbiturates NONE DETECTED NONE DETECTED  CBC with Differential/Platelet     Status: Abnormal   Collection Time: 08/21/16 10:42 PM  Result Value Ref Range   WBC 9.1 4.0 - 10.5 K/uL   RBC  3.61 (L) 3.87 - 5.11 MIL/uL   Hemoglobin 11.9 (L) 12.0 - 15.0 g/dL   HCT 34.6 (L) 36.0 - 46.0 %   MCV 95.8 78.0 - 100.0 fL   MCH 33.0 26.0 - 34.0 pg   MCHC 34.4 30.0 - 36.0 g/dL   RDW 13.1 11.5 - 15.5 %   Platelets 268 150 - 400 K/uL   Neutrophils Relative % 53 %   Neutro Abs 4.8 1.7 - 7.7 K/uL   Lymphocytes Relative 35 %   Lymphs Abs 3.2 0.7 - 4.0 K/uL   Monocytes Relative 9 %   Monocytes Absolute 0.8 0.1 - 1.0 K/uL   Eosinophils Relative 3 %   Eosinophils Absolute 0.3 0.0 - 0.7 K/uL   Basophils Relative 0 %   Basophils Absolute 0.0 0.0 - 0.1 K/uL  Comprehensive metabolic panel     Status: Abnormal   Collection Time: 08/21/16 10:42 PM  Result Value Ref Range   Sodium 134 (L) 135 - 145 mmol/L   Potassium 3.7 3.5 - 5.1 mmol/L   Chloride 105 101 - 111 mmol/L   CO2 21 (L) 22 - 32 mmol/L   Glucose, Bld 85 65 - 99 mg/dL   BUN <5 (L) 6 - 20 mg/dL   Creatinine, Ser 0.42 (L) 0.44 - 1.00 mg/dL   Calcium 9.2 8.9 - 10.3 mg/dL   Total Protein 6.9 6.5 - 8.1 g/dL   Albumin 3.4 (L) 3.5 - 5.0 g/dL   AST 20 15 - 41 U/L   ALT 7 (L) 14 - 54 U/L   Alkaline Phosphatase 174 (H) 38 - 126 U/L   Total Bilirubin 0.5 0.3 - 1.2 mg/dL   GFR calc non Af Amer >60 >60 mL/min   GFR calc Af Amer >60 >60 mL/min   Anion gap 8 5 - 15  Ethanol     Status: None   Collection Time: 08/21/16 11:05 PM  Result Value Ref Range   Alcohol, Ethyl (B) <5 <5 mg/dL     Imaging:  Ct Head Wo Contrast  Result Date: 08/21/2016 CLINICAL DATA:  Headache.  Fatigue. EXAM: CT  HEAD WITHOUT CONTRAST TECHNIQUE: Contiguous axial images were obtained from the base of the skull through the vertex without intravenous contrast. COMPARISON:  04/09/2013. FINDINGS: Brain: No evidence of acute infarction, hemorrhage, hydrocephalus, extra-axial collection or mass lesion/mass effect. Vascular: No hyperdense vessel or unexpected calcification. Skull: Normal. Negative for fracture or focal lesion. Sinuses/Orbits: Unremarkable. Other: None.  IMPRESSION: Normal examination. Electronically Signed   By: Claudie Revering M.D.   On: 08/21/2016 23:42     MAU Course/MDM: I have ordered labs and reviewed results.  NST reviewed Head CT ordered per consult Dr Elonda Husky due to unknown status of trauma.  This was negative Consult Dr Elonda Husky with presentation, exam findings and test results.  Treatments in MAU included NST . Fioricet given for headache with good resolution Reglan and decadron also given. Discussed cocaine with pt. And recommended she abstain from all substances.  Encouraged her to get support services at HD. Has appt 16th.    Assessment: [facility] at [redacted]w[redacted]d Acute non intractable tension-type headache - Plan: Discharge patient  Cocaine abuse affecting pregnancy in second trimester - Plan: Discharge patient  Acute bilateral low back pain without sciatica - Plan: Discharge patient  Gastroesophageal reflux disease without esophagitis - Plan: Discharge patient  Localized swelling/mass of head  Plan: Discharge home Rx Protonix for GERD. Rx pregnancy support belt Recommend continue interventions per Dr Raeford Razor recommendations.  Preterm Labor precautions and fetal kick counts Follow up in HD for prenatal visits and recheck of social situation and progress in stopping cocaine  Encouraged to return here or to other Urgent Care/ED if she develops worsening of symptoms, increase in pain, fever, or other concerning symptoms.   Pt stable at time of discharge.  Hansel Feinstein CNM, MSN Certified Nurse-Midwife 08/21/2016 10:08 PM

## 2016-08-22 DIAGNOSIS — G44209 Tension-type headache, unspecified, not intractable: Secondary | ICD-10-CM | POA: Diagnosis not present

## 2016-08-22 DIAGNOSIS — O9989 Other specified diseases and conditions complicating pregnancy, childbirth and the puerperium: Secondary | ICD-10-CM | POA: Diagnosis not present

## 2016-08-22 LAB — ETHANOL: Alcohol, Ethyl (B): <5 mg/dL (ref <5)

## 2016-08-22 MED ORDER — PANTOPRAZOLE SODIUM 20 MG PO TBEC: 20.0000 mg | DELAYED_RELEASE_TABLET | Freq: Every day | ORAL | 0 refills | Status: DC

## 2016-08-22 NOTE — Discharge Instructions (Signed)
Back Exercises The following exercises strengthen the muscles that help to support the back. They also help to keep the lower back flexible. Doing these exercises can help to prevent back pain or lessen existing pain. If you have back pain or discomfort, try doing these exercises 2-3 times each day or as told by your health care provider. When the pain goes away, do them once each day, but increase the number of times that you repeat the steps for each exercise (do more repetitions). If you do not have back pain or discomfort, do these exercises once each day or as told by your health care provider. Exercises Single Knee to Chest  Repeat these steps 3-5 times for each leg: 1. Lie on your back on a firm bed or the floor with your legs extended. 2. Bring one knee to your chest. Your other leg should stay extended and in contact with the floor. 3. Hold your knee in place by grabbing your knee or thigh. 4. Pull on your knee until you feel a gentle stretch in your lower back. 5. Hold the stretch for 10-30 seconds. 6. Slowly release and straighten your leg.  Pelvic Tilt  Repeat these steps 5-10 times: 1. Lie on your back on a firm bed or the floor with your legs extended. 2. Bend your knees so they are pointing toward the ceiling and your feet are flat on the floor. 3. Tighten your lower abdominal muscles to press your lower back against the floor. This motion will tilt your pelvis so your tailbone points up toward the ceiling instead of pointing to your feet or the floor. 4. With gentle tension and even breathing, hold this position for 5-10 seconds.  Cat-Cow  Repeat these steps until your lower back becomes more flexible: 1. Get into a hands-and-knees position on a firm surface. Keep your hands under your shoulders, and keep your knees under your hips. You may place padding under your knees for comfort. 2. Let your head hang down, and point your tailbone toward the floor so your lower back  becomes rounded like the back of a cat. 3. Hold this position for 5 seconds. 4. Slowly lift your head and point your tailbone up toward the ceiling so your back forms a sagging arch like the back of a cow. 5. Hold this position for 5 seconds.  Press-Ups  Repeat these steps 5-10 times: 1. Lie on your abdomen (face-down) on the floor. 2. Place your palms near your head, about shoulder-width apart. 3. While you keep your back as relaxed as possible and keep your hips on the floor, slowly straighten your arms to raise the top half of your body and lift your shoulders. Do not use your back muscles to raise your upper torso. You may adjust the placement of your hands to make yourself more comfortable. 4. Hold this position for 5 seconds while you keep your back relaxed. 5. Slowly return to lying flat on the floor.  Bridges  Repeat these steps 10 times: 1. Lie on your back on a firm surface. 2. Bend your knees so they are pointing toward the ceiling and your feet are flat on the floor. 3. Tighten your buttocks muscles and lift your buttocks off of the floor until your waist is at almost the same height as your knees. You should feel the muscles working in your buttocks and the back of your thighs. If you do not feel these muscles, slide your feet 1-2 inches farther away  from your buttocks. 4. Hold this position for 3-5 seconds. 5. Slowly lower your hips to the starting position, and allow your buttocks muscles to relax completely.  If this exercise is too easy, try doing it with your arms crossed over your chest. Abdominal Crunches  Repeat these steps 5-10 times: 1. Lie on your back on a firm bed or the floor with your legs extended. 2. Bend your knees so they are pointing toward the ceiling and your feet are flat on the floor. 3. Cross your arms over your chest. 4. Tip your chin slightly toward your chest without bending your neck. 5. Tighten your abdominal muscles and slowly raise your  trunk (torso) high enough to lift your shoulder blades a tiny bit off of the floor. Avoid raising your torso higher than that, because it can put too much stress on your low back and it does not help to strengthen your abdominal muscles. 6. Slowly return to your starting position.  Back Lifts Repeat these steps 5-10 times: 1. Lie on your abdomen (face-down) with your arms at your sides, and rest your forehead on the floor. 2. Tighten the muscles in your legs and your buttocks. 3. Slowly lift your chest off of the floor while you keep your hips pressed to the floor. Keep the back of your head in line with the curve in your back. Your eyes should be looking at the floor. 4. Hold this position for 3-5 seconds. 5. Slowly return to your starting position.  Contact a health care provider if:  Your back pain or discomfort gets much worse when you do an exercise.  Your back pain or discomfort does not lessen within 2 hours after you exercise. If you have any of these problems, stop doing these exercises right away. Do not do them again unless your health care provider says that you can. Get help right away if:  You develop sudden, severe back pain. If this happens, stop doing the exercises right away. Do not do them again unless your health care provider says that you can. This information is not intended to replace advice given to you by your health care provider. Make sure you discuss any questions you have with your health care provider. Document Released: 02/06/2004 Document Revised: 05/08/2015 Document Reviewed: 02/22/2014 Elsevier Interactive Patient Education  2017 [place] Injury Prevention Back injuries can be very painful. They can also be difficult to heal. After having one back injury, you are more likely to injure your back again. It is important to learn how to avoid injuring or re-injuring your back. The following tips can help you to prevent a back injury. What should  I know about physical fitness?  Exercise for 30 minutes per day on most days of the week or as directed by your health care provider. Make sure to: ? Do aerobic exercises, such as walking, jogging, biking, or swimming. ? Do exercises that increase balance and strength, such as tai chi and yoga. These can decrease your risk of falling and injuring your back. ? Do stretching exercises to help with flexibility. ? Try to develop strong abdominal muscles. Your abdominal muscles provide a lot of the support that is needed by your back.  Maintain a healthy weight. This helps to decrease your risk of a back injury. What should I know about my diet?  Talk with your health care provider about your overall diet. Take supplements and vitamins only as directed by your health care  provider.  Talk with your health care provider about how much calcium and vitamin D you need each day. These nutrients help to prevent weakening of the bones (osteoporosis). Osteoporosis can cause broken (fractured) bones, which lead to back pain.  Include good sources of calcium in your diet, such as dairy products, green leafy vegetables, and products that have had calcium added to them (fortified).  Include good sources of vitamin D in your diet, such as milk and foods that are fortified with vitamin D. What should I know about my posture?  Sit up straight and stand up straight. Avoid leaning forward when you sit or hunching over when you stand.  Choose chairs that have good low-back (lumbar) support.  If you work at a desk, sit close to it so you do not need to lean over. Keep your chin tucked in. Keep your neck drawn back, and keep your elbows bent at a right angle. Your arms should look like the letter "L."  Sit high and close to the steering wheel when you drive. Add a lumbar support to your car seat, if needed.  Avoid sitting or standing in one position for very long. Take breaks to get up, stretch, and walk around at  least one time every hour. Take breaks every hour if you are driving for long periods of time.  Sleep on your side with your knees slightly bent, or sleep on your back with a pillow under your knees. Do not lie on the front of your body to sleep. What should I know about lifting, twisting, and reaching? Lifting and Heavy Lifting   Avoid heavy lifting, especially repetitive heavy lifting. If you must do heavy lifting: ? Stretch before lifting. ? Work slowly. ? Rest between lifts. ? Use a tool such as a cart or a dolly to move objects if one is available. ? Make several small trips instead of carrying one heavy load. ? Ask for help when you need it, especially when moving big objects.  Follow these steps when lifting: ? Stand with your feet shoulder-width apart. ? Get as close to the object as you can. Do not try to pick up a heavy object that is far from your body. ? Use handles or lifting straps if they are available. ? Bend at your knees. Squat down, but keep your heels off the floor. ? Keep your shoulders pulled back, your chin tucked in, and your back straight. ? Lift the object slowly while you tighten the muscles in your legs, abdomen, and buttocks. Keep the object as close to the center of your body as possible.  Follow these steps when putting down a heavy load: ? Stand with your feet shoulder-width apart. ? Lower the object slowly while you tighten the muscles in your legs, abdomen, and buttocks. Keep the object as close to the center of your body as possible. ? Keep your shoulders pulled back, your chin tucked in, and your back straight. ? Bend at your knees. Squat down, but keep your heels off the floor. ? Use handles or lifting straps if they are available. Twisting and Reaching  Avoid lifting heavy objects above your waist.  Do not twist at your waist while you are lifting or carrying a load. If you need to turn, move your feet.  Do not bend over without bending at your  knees.  Avoid reaching over your head, across a table, or for an object on a high surface. What are some other tips?  Avoid wet floors and icy ground. Keep sidewalks clear of ice to prevent falls.  Do not sleep on a mattress that is too soft or too hard.  Keep items that are used frequently within easy reach.  Put heavier objects on shelves at waist level, and put lighter objects on lower or higher shelves.  Find ways to decrease your stress, such as exercise, massage, or relaxation techniques. Stress can build up in your muscles. Tense muscles are more vulnerable to injury.  Talk with your health care provider if you feel anxious or depressed. These conditions can make back pain worse.  Wear flat heel shoes with cushioned soles.  Avoid sudden movements.  Use both shoulder straps when carrying a backpack.  Do not use any tobacco products, including cigarettes, chewing tobacco, or electronic cigarettes. If you need help quitting, ask your health care provider. This information is not intended to replace advice given to you by your health care provider. Make sure you discuss any questions you have with your health care provider. Document Released: 02/06/2004 Document Revised: 06/06/2015 Document Reviewed: 01/02/2014 Elsevier Interactive Patient Education  2017 [employer]  Heartburn Heartburn is a type of pain or discomfort that can happen in the throat or chest. It is often described as a burning pain. It may also cause a bad taste in the mouth. Heartburn may feel worse when you lie down or bend over, and it is often worse at night. Heartburn may be caused by stomach contents that move back up into the esophagus (reflux). Follow these instructions at home: Take these actions to decrease your discomfort and to help avoid complications. Diet  Follow a diet as recommended by your health care provider. This may involve avoiding foods and drinks such as: ? Coffee and tea (with or  without caffeine). ? Drinks that contain alcohol. ? Energy drinks and sports drinks. ? Carbonated drinks or sodas. ? Chocolate and cocoa. ? Peppermint and mint flavorings. ? Garlic and onions. ? Horseradish. ? Spicy and acidic foods, including peppers, chili powder, curry powder, vinegar, hot sauces, and barbecue sauce. ? Citrus fruit juices and citrus fruits, such as oranges, lemons, and limes. ? Tomato-based foods, such as red sauce, chili, salsa, and pizza with red sauce. ? Fried and fatty foods, such as donuts, french fries, potato chips, and high-fat dressings. ? High-fat meats, such as hot dogs and fatty cuts of red and white meats, such as rib eye steak, sausage, ham, and bacon. ? High-fat dairy items, such as whole milk, butter, and cream cheese.  Eat small, frequent meals instead of large meals.  Avoid drinking large amounts of liquid with your meals.  Avoid eating meals during the 2-3 hours before bedtime.  Avoid lying down right after you eat.  Do not exercise right after you eat. General instructions  Pay attention to any changes in your symptoms.  Take over-the-counter and prescription medicines only as told by your health care provider. Do not take aspirin, ibuprofen, or other NSAIDs unless your health care provider told you to do so.  Do not use any tobacco products, including cigarettes, chewing tobacco, and e-cigarettes. If you need help quitting, ask your health care provider.  Wear loose-fitting clothing. Do not wear anything tight around your waist that causes pressure on your abdomen.  Raise (elevate) the head of your bed about 6 inches (15 cm).  Try to reduce your stress, such as with yoga or meditation. If you need help reducing stress, ask your health  care provider.  If you are overweight, reduce your weight to an amount that is healthy for you. Ask your health care provider for guidance about a safe weight loss goal.  Keep all follow-up visits as told  by your health care provider. This is important. Contact a health care provider if:  You have new symptoms.  You have unexplained weight loss.  You have difficulty swallowing, or it hurts to swallow.  You have wheezing or a persistent cough.  Your symptoms do not improve with treatment.  You have frequent heartburn for more than two weeks. Get help right away if:  You have pain in your arms, neck, jaw, teeth, or back.  You feel sweaty, dizzy, or light-headed.  You have chest pain or shortness of breath.  You vomit and your vomit looks like blood or coffee grounds.  Your stool is bloody or black. This information is not intended to replace advice given to you by your health care provider. Make sure you discuss any questions you have with your health care provider. Document Released: 05/17/2008 Document Revised: 06/06/2015 Document Reviewed: 04/25/2014 Elsevier Interactive Patient Education  2017 [place] dependency is an addiction to drugs or alcohol. People with this addiction repeatedly seek out and use drugs or alcohol despite negative consequences to the health and safety of themselves and others. Addiction changes the way the brain works. Because of these changes, addiction is a chronic condition. The medical term for addiction or chemical dependency is substance use disorder. The disorder can be mild, moderate, or severe. People can be dependent on a range of substances. These include alcohol, prescription medicines, and illegal or street drugs, such as marijuana, heroin, and cocaine. What are the causes? This condition is caused by the effect that the abused substance has on the brain. What increases the risk? The following factors may make you more likely to develop this condition:  Havinga family history of chemical dependency.  Having mental health issues, such as depression, anxiety, or bipolar disorder.  Living in an  environment where drugs and alcohol are easily available.  Using drugs or alcohol at a young age.  Having friends who use drugs or alcohol.  Having poor social skills.  Tending to be aggressive or impulsive.  What are the signs or symptoms? Symptoms may vary depending on the substance that you are addicted to. Symptoms may include the following: Physical Symptoms  The inability to limit the use of drugs or alcohol.  Having nausea, sweating, shakiness, and anxiety when you are not using alcohol or drugs.  Needing a greater amount of drugs or alcohol to get the same effect (developing tolerance).  A change in: ? Sleeping habits. ? Eating or appetite. ? Appearance or how you care for yourself. Emotional Symptoms  Angry outbursts.  Periods of sadness and tearfulness.  Isolation. Relationship Problems  Loved ones suggesting that you have a problem.  Increased fights.  Forgetting commitments.  Having affairs or one-night stands. Problems Related to Irresponsibility  Legal problems.  Irresponsibility with money.  Missing work.  Poor decision making. How is this diagnosed? This condition may be diagnosed based on your symptoms, your medical history, and a physical exam. You may also have blood tests and urine tests. How is this treated? Treatment for this condition depends on the substance that you are addicted to and whether your dependency is mild, moderate, or severe. Treatment options may include:  Stopping substance use safely. This may require taking  medicines and being closely observed for several days.  Taking part in group and individual counseling with mental health providers who help people with chemical dependency.  Staying at a residential treatment center for several days or weeks.  Attending daily counseling sessions at a treatment center.  Taking medicine as told by your health care provider to: ? Ease symptoms and prevent complications during  withdrawal. ? Treat other mental health issues, such as depression or anxiety. ? Block cravings by causing the same effects as the substance. ? Block the effects of the substance or replace good sensations with unpleasant ones.  Going to a support group to share your experience with others who are going through the same thing. These groups are an important part of long-term recovery for many people. They include 12-step programs like Alcoholics Anonymous and Narcotics Anonymous.  Recovery can be a long process. Many people who undergo treatment start using the substance again after stopping. This is called a relapse. If you have a relapse, it does not mean that treatment will not work. Follow these instructions at home:  Avoid temptations or triggers that you associate with your use of the substance.  Learn and practice techniques for managing stress.  Have a plan for vulnerable moments.  Get phone numbers of those who are willing to help and who are committed to your recovery.  Know when and where the meetings that you have chosen will occur.  Take over-the-counter and prescription medicines only as told by your health care provider.  Keep all follow-up visits as told by your health care provider. This is important. Contact a health care provider if:  You cannot take your medicines as told.  Your symptoms get worse.  You have trouble resisting the urge to use drugs or alcohol.  You are in pain, shaking, sweating, or feeling generally unwell.  You are losing weight without trying to. Get help right away if:  You lose consciousness.  Your breathing is slow.  Your pulse is slow or jumpy.  You have serious thoughts about hurting yourself or someone else.  You have a relapse. This information is not intended to replace advice given to you by your health care provider. Make sure you discuss any questions you have with your health care provider. Document Released: 12/23/2000  Document Revised: 02/04/2015 Document Reviewed: 09/05/2014 Elsevier Interactive Patient Education  [employer].

## 2016-08-29 ENCOUNTER — Emergency Department (HOSPITAL_COMMUNITY): Payer: Medicaid Other

## 2016-08-29 ENCOUNTER — Emergency Department (HOSPITAL_COMMUNITY)
Admission: EM | Admit: 2016-08-29 | Discharge: 2016-08-30 | Payer: Medicaid Other | Attending: Emergency Medicine | Admitting: Emergency Medicine

## 2016-08-29 ENCOUNTER — Encounter (HOSPITAL_COMMUNITY): Payer: Self-pay

## 2016-08-29 DIAGNOSIS — O99323 Drug use complicating pregnancy, third trimester: Secondary | ICD-10-CM | POA: Diagnosis not present

## 2016-08-29 DIAGNOSIS — F1010 Alcohol abuse, uncomplicated: Secondary | ICD-10-CM | POA: Insufficient documentation

## 2016-08-29 DIAGNOSIS — F329 Major depressive disorder, single episode, unspecified: Secondary | ICD-10-CM | POA: Insufficient documentation

## 2016-08-29 DIAGNOSIS — Z3A28 28 weeks gestation of pregnancy: Secondary | ICD-10-CM | POA: Diagnosis not present

## 2016-08-29 DIAGNOSIS — Y906 Blood alcohol level of 120-199 mg/100 ml: Secondary | ICD-10-CM | POA: Insufficient documentation

## 2016-08-29 DIAGNOSIS — F17210 Nicotine dependence, cigarettes, uncomplicated: Secondary | ICD-10-CM | POA: Diagnosis not present

## 2016-08-29 DIAGNOSIS — O99313 Alcohol use complicating pregnancy, third trimester: Secondary | ICD-10-CM | POA: Diagnosis not present

## 2016-08-29 DIAGNOSIS — F1490 Cocaine use, unspecified, uncomplicated: Secondary | ICD-10-CM | POA: Insufficient documentation

## 2016-08-29 DIAGNOSIS — W503XXA Accidental bite by another person, initial encounter: Secondary | ICD-10-CM

## 2016-08-29 DIAGNOSIS — O9A313 Physical abuse complicating pregnancy, third trimester: Secondary | ICD-10-CM | POA: Insufficient documentation

## 2016-08-29 DIAGNOSIS — F99 Mental disorder, not otherwise specified: Secondary | ICD-10-CM

## 2016-08-29 DIAGNOSIS — O99333 Smoking (tobacco) complicating pregnancy, third trimester: Secondary | ICD-10-CM | POA: Diagnosis not present

## 2016-08-29 DIAGNOSIS — S61259A Open bite of unspecified finger without damage to nail, initial encounter: Secondary | ICD-10-CM

## 2016-08-29 DIAGNOSIS — F1910 Other psychoactive substance abuse, uncomplicated: Secondary | ICD-10-CM

## 2016-08-29 LAB — CBC WITH DIFFERENTIAL/PLATELET
BASOS ABS: 0 10*3/uL (ref 0.0–0.1)
Basophils Relative: 0 %
EOS PCT: 1 %
Eosinophils Absolute: 0.1 10*3/uL (ref 0.0–0.7)
HEMATOCRIT: 33.9 % — AB (ref 36.0–46.0)
HEMOGLOBIN: 11.6 g/dL — AB (ref 12.0–15.0)
LYMPHS PCT: 25 %
Lymphs Abs: 3.7 10*3/uL (ref 0.7–4.0)
MCH: 32.3 pg (ref 26.0–34.0)
MCHC: 34.2 g/dL (ref 30.0–36.0)
MCV: 94.4 fL (ref 78.0–100.0)
Monocytes Absolute: 0.7 10*3/uL (ref 0.1–1.0)
Monocytes Relative: 5 %
NEUTROS ABS: 9.9 10*3/uL — AB (ref 1.7–7.7)
NEUTROS PCT: 69 %
PLATELETS: 276 10*3/uL (ref 150–400)
RBC: 3.59 MIL/uL — AB (ref 3.87–5.11)
RDW: 13.1 % (ref 11.5–15.5)
WBC: 14.4 10*3/uL — AB (ref 4.0–10.5)

## 2016-08-29 LAB — RAPID URINE DRUG SCREEN, HOSP PERFORMED
Amphetamines: POSITIVE — AB
BARBITURATES: NOT DETECTED
Benzodiazepines: NOT DETECTED
Cocaine: POSITIVE — AB
Opiates: NOT DETECTED
Tetrahydrocannabinol: NOT DETECTED

## 2016-08-29 LAB — COMPREHENSIVE METABOLIC PANEL
ALBUMIN: 3.5 g/dL (ref 3.5–5.0)
ALK PHOS: 176 U/L — AB (ref 38–126)
ALT: 14 U/L (ref 14–54)
AST: 29 U/L (ref 15–41)
Anion gap: 12 (ref 5–15)
BILIRUBIN TOTAL: 0.3 mg/dL (ref 0.3–1.2)
CALCIUM: 8.6 mg/dL — AB (ref 8.9–10.3)
CO2: 18 mmol/L — ABNORMAL LOW (ref 22–32)
CREATININE: 0.61 mg/dL (ref 0.44–1.00)
Chloride: 109 mmol/L (ref 101–111)
GFR calc Af Amer: >60 mL/min (ref >60)
GLUCOSE: 98 mg/dL (ref 65–99)
Potassium: 3.2 mmol/L — ABNORMAL LOW (ref 3.5–5.1)
Sodium: 139 mmol/L (ref 135–145)
Total Protein: 7.1 g/dL (ref 6.5–8.1)

## 2016-08-29 LAB — ETHANOL: ALCOHOL ETHYL (B): 164 mg/dL — AB (ref <5)

## 2016-08-29 MED ORDER — ACETAMINOPHEN 325 MG PO TABS
650.0000 mg | ORAL_TABLET | Freq: Once | ORAL | Status: AC
  Administered 2016-08-29: 650 mg via ORAL
  Filled 2016-08-29: qty 2

## 2016-08-29 MED ORDER — AMOXICILLIN-POT CLAVULANATE 875-125 MG PO TABS
1.0000 | ORAL_TABLET | Freq: Once | ORAL | Status: AC
  Administered 2016-08-30: 1 via ORAL
  Filled 2016-08-29: qty 1
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
## 2016-08-29 NOTE — ED Notes (Signed)
ED Provider at bedside. 

## 2016-08-30 MED ORDER — AMOXICILLIN-POT CLAVULANATE 875-125 MG PO TABS: 1.0000 | ORAL_TABLET | Freq: Two times a day (BID) | ORAL | 0 refills | Status: DC
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
## 2016-08-30 NOTE — Discharge Instructions (Signed)
You must take your antibiotic 2 x a day until finished to prevent infection. Return immediately to the Ed for any of the following: You have increasing fluid, blood, or pus coming from your wound. You have increasing redness, swelling, or pain at the site of your wound. You have a red streak extending away from your wound. You have a fever.

## 2016-10-19 LAB — OB RESULTS CONSOLE GBS: STREP GROUP B AG: POSITIVE

## 2016-10-19 LAB — OB RESULTS CONSOLE GC/CHLAMYDIA
CHLAMYDIA, DNA PROBE: NEGATIVE
Gonorrhea: NEGATIVE

## 2016-11-19 ENCOUNTER — Encounter (HOSPITAL_COMMUNITY): Payer: Self-pay | Admitting: *Deleted

## 2016-11-19 ENCOUNTER — Telehealth (HOSPITAL_COMMUNITY): Payer: Self-pay | Admitting: *Deleted

## 2016-11-19 NOTE — Telephone Encounter (Signed)
Preadmission screen  

## 2016-11-23 ENCOUNTER — Inpatient Hospital Stay (HOSPITAL_COMMUNITY)
Admission: RE | Admit: 2016-11-23 | Discharge: 2016-11-26 | DRG: 806 | Disposition: A | Discharge status: Home / Self Care | Payer: Medicaid Other | Source: Ambulatory Visit | Attending: Family Medicine | Admitting: Family Medicine

## 2016-11-23 ENCOUNTER — Inpatient Hospital Stay (HOSPITAL_COMMUNITY): Payer: Medicaid Other | Admitting: Anesthesiology

## 2016-11-23 ENCOUNTER — Other Ambulatory Visit: Payer: Self-pay

## 2016-11-23 ENCOUNTER — Encounter (HOSPITAL_COMMUNITY): Payer: Self-pay | Admitting: Certified Registered Nurse Anesthetist

## 2016-11-23 ENCOUNTER — Encounter (HOSPITAL_COMMUNITY): Payer: Self-pay

## 2016-11-23 DIAGNOSIS — O99824 Streptococcus B carrier state complicating childbirth: Secondary | ICD-10-CM | POA: Diagnosis present

## 2016-11-23 DIAGNOSIS — O99324 Drug use complicating childbirth: Secondary | ICD-10-CM | POA: Diagnosis present

## 2016-11-23 DIAGNOSIS — Z3A41 41 weeks gestation of pregnancy: Secondary | ICD-10-CM | POA: Diagnosis not present

## 2016-11-23 DIAGNOSIS — F17210 Nicotine dependence, cigarettes, uncomplicated: Secondary | ICD-10-CM | POA: Diagnosis present

## 2016-11-23 DIAGNOSIS — F1220 Cannabis dependence, uncomplicated: Secondary | ICD-10-CM | POA: Diagnosis present

## 2016-11-23 DIAGNOSIS — F1410 Cocaine abuse, uncomplicated: Secondary | ICD-10-CM | POA: Diagnosis present

## 2016-11-23 DIAGNOSIS — O480 Post-term pregnancy: Secondary | ICD-10-CM | POA: Diagnosis present

## 2016-11-23 DIAGNOSIS — O99334 Smoking (tobacco) complicating childbirth: Secondary | ICD-10-CM | POA: Diagnosis present

## 2016-11-23 LAB — RAPID URINE DRUG SCREEN, HOSP PERFORMED
AMPHETAMINES: NOT DETECTED
BARBITURATES: NOT DETECTED
BENZODIAZEPINES: NOT DETECTED
COCAINE: NOT DETECTED
Opiates: NOT DETECTED
TETRAHYDROCANNABINOL: NOT DETECTED

## 2016-11-23 LAB — RAPID HIV SCREEN (HIV 1/2 AB+AG)
HIV 1/2 Antibodies: NONREACTIVE
HIV-1 P24 Antigen - HIV24: NONREACTIVE

## 2016-11-23 LAB — CBC
HEMATOCRIT: 35.6 % — AB (ref 36.0–46.0)
HEMOGLOBIN: 11.7 g/dL — AB (ref 12.0–15.0)
MCH: 32.1 pg (ref 26.0–34.0)
MCHC: 32.9 g/dL (ref 30.0–36.0)
MCV: 97.8 fL (ref 78.0–100.0)
Platelets: 222 10*3/uL (ref 150–400)
RBC: 3.64 MIL/uL — ABNORMAL LOW (ref 3.87–5.11)
RDW: 13.4 % (ref 11.5–15.5)
WBC: 8.3 10*3/uL (ref 4.0–10.5)

## 2016-11-23 LAB — RPR: RPR Ser Ql: NONREACTIVE

## 2016-11-23 MED ORDER — ONDANSETRON HCL 4 MG/2ML IJ SOLN
4.0000 mg | Freq: Four times a day (QID) | INTRAMUSCULAR | Status: DC | PRN
  Administered 2016-11-24: 4 mg via INTRAVENOUS
  Filled 2016-11-23: qty 2

## 2016-11-23 MED ORDER — OXYTOCIN 40 UNITS IN LACTATED RINGERS INFUSION - SIMPLE MED: 2.5000 [IU]/h | INTRAVENOUS | Status: DC

## 2016-11-23 MED ORDER — PENICILLIN G POTASSIUM 5000000 UNITS IJ SOLR
5.0000 10*6.[IU] | Freq: Once | INTRAVENOUS | Status: AC
  Administered 2016-11-23: 5 10*6.[IU] via INTRAVENOUS
  Filled 2016-11-23: qty 5

## 2016-11-23 MED ORDER — OXYTOCIN 40 UNITS IN LACTATED RINGERS INFUSION - SIMPLE MED
1.0000 m[IU]/min | INTRAVENOUS | Status: DC
  Administered 2016-11-23: 2 m[IU]/min via INTRAVENOUS
  Filled 2016-11-23: qty 1000

## 2016-11-23 MED ORDER — EPHEDRINE 5 MG/ML INJ: 10.0000 mg | INTRAVENOUS | Status: DC | PRN

## 2016-11-23 MED ORDER — PHENYLEPHRINE 40 MCG/ML (10ML) SYRINGE FOR IV PUSH (FOR BLOOD PRESSURE SUPPORT): 80.0000 ug | PREFILLED_SYRINGE | INTRAVENOUS | Status: DC | PRN

## 2016-11-23 MED ORDER — LACTATED RINGERS IV SOLN: 500.0000 mL | Freq: Once | INTRAVENOUS | Status: DC

## 2016-11-23 MED ORDER — EPHEDRINE 5 MG/ML INJ
10.0000 mg | INTRAVENOUS | Status: DC | PRN
  Filled 2016-11-23: qty 2

## 2016-11-23 MED ORDER — OXYTOCIN 40 UNITS IN LACTATED RINGERS INFUSION - SIMPLE MED: 1.0000 m[IU]/min | INTRAVENOUS | Status: DC

## 2016-11-23 MED ORDER — OXYCODONE-ACETAMINOPHEN 5-325 MG PO TABS: 2.0000 | ORAL_TABLET | ORAL | Status: DC | PRN

## 2016-11-23 MED ORDER — FENTANYL 2.5 MCG/ML BUPIVACAINE 1/10 % EPIDURAL INFUSION (WH - ANES)
14.0000 mL/h | INTRAMUSCULAR | Status: DC | PRN
  Administered 2016-11-23 (×2): 14 mL/h via EPIDURAL
  Filled 2016-11-23 (×2): qty 100

## 2016-11-23 MED ORDER — LACTATED RINGERS IV SOLN: 500.0000 mL | INTRAVENOUS | Status: DC | PRN

## 2016-11-23 MED ORDER — LACTATED RINGERS IV SOLN
INTRAVENOUS | Status: DC
  Administered 2016-11-23 (×3): via INTRAVENOUS

## 2016-11-23 MED ORDER — PENICILLIN G POT IN DEXTROSE 60000 UNIT/ML IV SOLN
3.0000 10*6.[IU] | INTRAVENOUS | Status: DC
  Administered 2016-11-23 – 2016-11-24 (×4): 3 10*6.[IU] via INTRAVENOUS
  Filled 2016-11-23 (×7): qty 50

## 2016-11-23 MED ORDER — PHENYLEPHRINE 40 MCG/ML (10ML) SYRINGE FOR IV PUSH (FOR BLOOD PRESSURE SUPPORT)
80.0000 ug | PREFILLED_SYRINGE | INTRAVENOUS | Status: DC | PRN
  Filled 2016-11-23: qty 5
  Filled 2016-11-23: qty 10

## 2016-11-23 MED ORDER — OXYCODONE-ACETAMINOPHEN 5-325 MG PO TABS: 1.0000 | ORAL_TABLET | ORAL | Status: DC | PRN

## 2016-11-23 MED ORDER — LACTATED RINGERS IV SOLN: INTRAVENOUS | Status: DC

## 2016-11-23 MED ORDER — MISOPROSTOL 200 MCG PO TABS
50.0000 ug | ORAL_TABLET | ORAL | Status: DC | PRN
  Administered 2016-11-23: 50 ug via BUCCAL
  Filled 2016-11-23 (×2): qty 1

## 2016-11-23 MED ORDER — SOD CITRATE-CITRIC ACID 500-334 MG/5ML PO SOLN
30.0000 mL | ORAL | Status: DC | PRN
  Administered 2016-11-23: 30 mL via ORAL
  Filled 2016-11-23: qty 15

## 2016-11-23 MED ORDER — TERBUTALINE SULFATE 1 MG/ML IJ SOLN
0.2500 mg | Freq: Once | INTRAMUSCULAR | Status: DC | PRN
  Filled 2016-11-23: qty 1

## 2016-11-23 MED ORDER — FLEET ENEMA 7-19 GM/118ML RE ENEM: 1.0000 | ENEMA | RECTAL | Status: DC | PRN

## 2016-11-23 MED ORDER — DIPHENHYDRAMINE HCL 50 MG/ML IJ SOLN: 12.5000 mg | INTRAMUSCULAR | Status: DC | PRN

## 2016-11-23 MED ORDER — LIDOCAINE HCL (PF) 1 % IJ SOLN
30.0000 mL | INTRAMUSCULAR | Status: DC | PRN
  Filled 2016-11-23: qty 30

## 2016-11-23 MED ORDER — ACETAMINOPHEN 325 MG PO TABS: 650.0000 mg | ORAL_TABLET | ORAL | Status: DC | PRN

## 2016-11-23 MED ORDER — OXYTOCIN BOLUS FROM INFUSION: 500.0000 mL | Freq: Once | INTRAVENOUS | Status: DC

## 2016-11-23 MED ORDER — FENTANYL CITRATE (PF) 100 MCG/2ML IJ SOLN
100.0000 ug | INTRAMUSCULAR | Status: DC | PRN
  Administered 2016-11-23 (×2): 100 ug via INTRAVENOUS
  Filled 2016-11-23 (×2): qty 2

## 2016-11-23 MED ORDER — LIDOCAINE HCL (PF) 1 % IJ SOLN
INTRAMUSCULAR | Status: DC | PRN
  Administered 2016-11-23: 3 mL
  Administered 2016-11-23: 5 mL
  Administered 2016-11-23: 2 mL

## 2016-11-23 MED ORDER — PHENYLEPHRINE 40 MCG/ML (10ML) SYRINGE FOR IV PUSH (FOR BLOOD PRESSURE SUPPORT)
80.0000 ug | PREFILLED_SYRINGE | INTRAVENOUS | Status: DC | PRN
  Filled 2016-11-23: qty 10
  Filled 2016-11-23: qty 5

## 2016-11-23 NOTE — Anesthesia Procedure Notes (Signed)
Epidural Patient location during procedure: OB Start time: 11/23/2016 6:08 PM End time: 11/23/2016 6:15 PM  Staffing Anesthesiologist: Suzette Battiest, MD Performed: anesthesiologist   Preanesthetic Checklist Completed: patient identified, site marked, surgical consent, pre-op evaluation, timeout performed, IV checked, risks and benefits discussed and monitors and equipment checked  Epidural Patient position: sitting Prep: site prepped and draped and DuraPrep Patient monitoring: continuous pulse ox and blood pressure Approach: midline Location: L4-L5 Injection technique: LOR air  Needle:  Needle type: Tuohy  Needle gauge: 17 G Needle length: 9 cm and 9 Needle insertion depth: 7 cm Catheter type: closed end flexible Catheter size: 19 Gauge Catheter at skin depth: 12 cm Test dose: negative  Assessment Events: blood not aspirated, injection not painful, no injection resistance, negative IV test and no paresthesia

## 2016-11-23 NOTE — Anesthesia Preprocedure Evaluation (Signed)
Anesthesia Evaluation  Patient identified by MRN, date of birth, ID band Patient awake    Reviewed: Allergy & Precautions, Patient's Chart, lab work & pertinent test results  Airway Mallampati: II  TM Distance: >3 FB     Dental   Pulmonary Current Smoker,    Pulmonary exam normal        Cardiovascular negative cardio ROS Normal cardiovascular exam     Neuro/Psych negative neurological ROS     GI/Hepatic negative GI ROS, Neg liver ROS,   Endo/Other  negative endocrine ROS  Renal/GU negative Renal ROS     Musculoskeletal   Abdominal   Peds  Hematology negative hematology ROS (+)   Anesthesia Other Findings   Reproductive/Obstetrics (+) Pregnancy                             Lab Results  Component Value Date   WBC 8.3 11/23/2016   HGB 11.7 (L) 11/23/2016   HCT 35.6 (L) 11/23/2016   MCV 97.8 11/23/2016   PLT 222 11/23/2016   Lab Results  Component Value Date   CREATININE 0.61 08/29/2016   BUN <5 (L) 08/29/2016   NA 139 08/29/2016   K 3.2 (L) 08/29/2016   CL 109 08/29/2016   CO2 18 (L) 08/29/2016    Anesthesia Physical Anesthesia Plan  ASA: II  Anesthesia Plan: Epidural   Post-op Pain Management:    Induction:   PONV Risk Score and Plan: Treatment may vary due to age or medical condition  Airway Management Planned:   Additional Equipment:   Intra-op Plan:   Post-operative Plan:   Informed Consent: I have reviewed the patients History and Physical, chart, labs and discussed the procedure including the risks, benefits and alternatives for the proposed anesthesia with the patient or authorized representative who has indicated his/her understanding and acceptance.     Plan Discussed with:   Anesthesia Plan Comments:         Anesthesia Quick Evaluation
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
## 2016-11-23 NOTE — Progress Notes (Signed)
Labor Progress Note Dawn Brock is a 31 y.o. [phone] at [redacted]w[redacted]d presented for IOL for postdates. S: Patient complains that IV hurts.  O:  BP 129/75   Pulse 86   Temp 97.8 F (36.6 C) (Oral)   Resp 20   Ht 5\' 7"  (1.702 m)   Wt 216 lb (98 kg)   LMP 02/10/2016   SpO2 99%   BMI 33.83 kg/m  EFM: 150/mod var/no decels  CVE: Dilation: 9 Effacement (%): 90 Station: -1 Presentation: Vertex Exam by:: Dr. Manus Rudd   A&P: 31 y.o. [id number] [redacted]w[redacted]d here for IOL for postdates #Labor: Progressing well. AROM 2035, light meconeum. Anticipate SVD. #FWB: cat 1 #GBS positive   Dannielle Huh, DO 8:38 PM

## 2016-11-23 NOTE — Progress Notes (Signed)
Labor Progress Note Dawn Brock is a 31 y.o. [phone] at [redacted]w[redacted]d presented for IOL for post-dates. S: Patient states that she continues to feels pressure and contractions. Denies headaches, visual changes or RUQ pain.   O:  BP 99/76 (BP Location: Right Arm)   Pulse 89   Temp (!) 97.5 F (36.4 C) (Axillary)   Resp 18   Ht 5\' 7"  (1.702 m)   Wt 216 lb (98 kg)   LMP 02/10/2016   BMI 33.83 kg/m  EFM: 150/mod vari/reactive  CVE: Dilation: 3.5 Effacement (%): 70 Station: -2 Presentation: Vertex Exam by:: Daralene Milch, RN   A&P: 31 y.o. [phone] [redacted]w[redacted]d IOL for post-dates. #Labor: Progressing well. Continue with pitocin #Pain: Per patient request (Nitrous gas) #FWB: cat 1 #GBS positive PCN   Ovidio Kin, MD 3:47 PM

## 2016-11-23 NOTE — Anesthesia Pain Management Evaluation Note (Signed)
  [facility] Pain Management Visit Note  Patient: Dawn Brock, 31 y.o., female  "Hello I am a member of the anesthesia team at [facility]. We have an anesthesia team available at all times to provide care throughout the hospital, including epidural management and anesthesia for C-section. I don't know your plan for the delivery whether it a natural birth, water birth, IV sedation, nitrous supplementation, doula or epidural, but we want to meet your pain goals."   1.Was your pain managed to your expectations on prior hospitalizations?   Yes   2.What is your expectation for pain management during this hospitalization?     Epidural and Nitrous Oxide  3.How can we help you reach that goal? N2O, possibly epidural.   Record the patient's initial score and the patient's pain goal.   Pain: 6--I told patient to ask her L&D RN for pain control interventions when she's ready.  Pain Goal: 3 [facility] wants you to be able to say your pain was always managed very well.  Kamar Callender L 11/23/2016

## 2016-11-23 NOTE — H&P (Signed)
OBSTETRIC ADMISSION HISTORY AND PHYSICAL  Dawn Brock is a 31 y.o. female [phone] with IUP at [redacted]w[redacted]d by LMP(02/10/16) presenting for IOL for Post-dates. She reports +FMs, No LOF, no VB, no blurry vision, headaches or peripheral edema, and RUQ pain.  She plans on breast and bottle feeding. She request IUD for birth control. She received her prenatal care at [facility]: By LMP(02/10/16) --->  Estimated Date of Delivery: 11/16/16  Sono:06/22/16    @[redacted]w[redacted]d , CWD, normal anatomy, cephalic presentation, 130.8M, 57.7% EFW  Prenatal History/Complications: Low-risk pregnancy Polysubstance use in pregnancy - UDS positive for cocaine, amphetamines, nicotine Elevated alcohol levels in pregnancy   Patient Active Problem List   Diagnosis Date Noted  . Post-dates pregnancy 11/23/2016  . Indication for care in labor or delivery 11/23/2016  . H/O LEEP 08/21/2016  . Limited prenatal care in second trimester 08/21/2016  . Headache 08/21/2016  . Mild tetrahydrocannabinol (THC) abuse 08/21/2016  . History of substance abuse 08/21/2016  . Cocaine abuse complicating pregnancy ([place]) 02/22/2013  . Alcohol dependence ([place]) 03/12/2012  . Cannabis dependence ([place]) 03/12/2012  . Executive function deficit 03/11/2012    Class: Acute  . Cigarette smoker 03/10/2012  . Duodenitis with nausea, vomiting, diarrhea and abdominal pain 03/10/2012    Past Medical History: Past Medical History:  Diagnosis Date  . Abnormal Pap smear   . Anemia   . Gastritis   . Hx of chlamydia infection   . Vaginal Pap smear, abnormal     Past Surgical History: Past Surgical History:  Procedure Laterality Date  . HERNIA REPAIR     As an infant  . LEEP      Obstetrical History: OB History    Gravida Para Term Preterm AB Living   4 3 3  0 0 3   SAB TAB Ectopic Multiple Live Births   0 0 0 0 3      Social History: Social History   Socioeconomic History  . Marital status: Single    Spouse name: Not on file   . Number of children: Not on file  . Years of education: Not on file  . Highest education level: Not on file  Social Needs  . Financial resource strain: Not on file  . Food insecurity - worry: Not on file  . Food insecurity - inability: Not on file  . Transportation needs - medical: Not on file  . Transportation needs - non-medical: Not on file  Occupational History  . Not on file  Tobacco Use  . Smoking status: Current Every Day Smoker    Packs/day: 0.50    Years: 14.00    Pack years: 7.00    Types: Cigarettes  . Smokeless tobacco: Never Used  Substance and Sexual Activity  . Alcohol use: Yes    Alcohol/week: 0.0 oz    Comment: Not since June 2014  . Drug use: Yes    Types: Cocaine, Marijuana    Comment: Patient denies any recent use of Mari[place] and cocaine as of 07/02/12    LAST SMOKED  MARIJUANA-   LAST WEEK LAST USED COCAINE-    2015  . Sexual activity: Yes  Other Topics Concern  . Not on file  Social History Narrative  . Not on file    Family History: Family History  Problem Relation Age of Onset  . Hypertension Mother   . Fibromyalgia Mother   . Cirrhosis Father   . Kidney disease Father   . Asthma Brother   .  Birth defects Son        hirschprungs    Allergies: Allergies  Allergen Reactions  . Tramadol Nausea And Vomiting    Medications Prior to Admission  Medication Sig Dispense Refill Last Dose  . acetaminophen (TYLENOL) 325 MG tablet Take 325 mg by mouth every 6 (six) hours as needed for headache.   08/20/2016 at Unknown time  . amoxicillin-clavulanate (AUGMENTIN) 875-125 MG tablet Take 1 tablet by mouth 2 (two) times daily. One po bid x 7 days 13 tablet 0   . clindamycin (CLEOCIN T) 1 % external solution Apply 1 application topically daily.   08/20/2016 at Unknown time  . cyclobenzaprine (FLEXERIL) 5 MG tablet Take 1 tablet (5 mg total) by mouth at bedtime. For neck pain. 15 tablet 0 Past Week at Unknown time  . pantoprazole (PROTONIX) 20 MG tablet Take  1 tablet (20 mg total) by mouth daily. 30 tablet 0   . Prenatal Vit-Fe Fumarate-FA (PRENATAL MULTIVITAMIN) TABS tablet Take 1 tablet by mouth daily at 12 noon.   08/20/2016 at Unknown time  . promethazine (PHENERGAN) 25 MG tablet Take 1 tablet (25 mg total) by mouth every 6 (six) hours as needed for nausea or vomiting. 30 tablet 0 Past Week at Unknown time     Review of Systems   All systems reviewed and negative except as stated in HPI  Blood pressure 131/79, pulse 84, temperature 98 F (36.7 C), temperature source Oral, resp. rate 18, height 5\' 7"  (1.702 m), weight 216 lb (98 kg), last menstrual period 02/10/2016. General appearance: alert, cooperative and appears stated age Lungs: clear to auscultation bilaterally Heart: regular rate and rhythm Abdomen: soft, non-tender; bowel sounds normal Extremities: Homans sign is negative, no sign of DVT Presentation: cephalic Fetal monitoringBaseline: 140 bpm, Variability: Good {> 6 bpm) and Accelerations: Reactive Uterine activityIntensity: mild Dilation: 2.5 Effacement (%): 70 Station: -2 Exam by:: Daralene Milch, RN   Prenatal labs: ABO, Rh: O/Positive/-- (05/03 0000) Antibody: Negative (05/03 0000) Rubella: Immune (05/03 0000) RPR: Nonreactive (05/03 0000)  HBsAg: Negative (05/03 0000)  HIV: Non-reactive (05/03 0000)  GBS: Positive (10/08 0000)  1 hr Glucola 162 3 hr Glucola fasting 97, 1 hr 125, 2 hr 110, 3 hr 123 Genetic screening Quad: increased DSR 1:116 Anatomy US normal boy  Prenatal Transfer Tool  Maternal Diabetes: No Genetic Screening: Abnormal:  Results: Other: Maternal Ultrasounds/Referrals: Normal Fetal Ultrasounds or other Referrals:  None Maternal Substance Abuse:  Yes:  Type: Smoker, Marijuana Significant Maternal Medications:  None Significant Maternal Lab Results: Lab values include: Group B Strep positive  No results found for this or any previous visit (from the past 24 hour(s)).  Patient Active Problem List    Diagnosis Date Noted  . Post-dates pregnancy 11/23/2016  . Indication for care in labor or delivery 11/23/2016  . H/O LEEP 08/21/2016  . Limited prenatal care in second trimester 08/21/2016  . Headache 08/21/2016  . Mild tetrahydrocannabinol (THC) abuse 08/21/2016  . History of substance abuse 08/21/2016  . Cocaine abuse complicating pregnancy ([place]) 02/22/2013  . Alcohol dependence ([place]) 03/12/2012  . Cannabis dependence ([place]) 03/12/2012  . Executive function deficit 03/11/2012    Class: Acute  . Cigarette smoker 03/10/2012  . Duodenitis with nausea, vomiting, diarrhea and abdominal pain 03/10/2012    Assessment/Plan:  Dawn Brock is a 31 y.o. [id number] at [redacted]w[redacted]d here for IOL for post dates.  #Labor: admit to birthing suites for Induction for post dates. Start cytotec buccal x 1  then consider starting pitocin, AROM if necessary. #Pain: Per patient request. (would like nitrous gas) #FWB: Cat 1  #ID:  GBS pos -PCN #MOF: breast and bottle #MOC:IUD #Circ:  Yes, boy. Considering inpatient vs outpatient #H/o substance abuse during pregnancy: +UDS in first trimester. Pending repeat labs. CSW consult.   Ovidio Kin, MD  11/23/2016, 9:21 AM  PGY-1  OB FELLOW HISTORY AND PHYSICAL ATTESTATION  I confirm that I have verified the information documented in the resident's note and that I have also personally reperformed the physical exam and all medical decision making activities. I agree with above documentation and have made edits as needed.   Luiz Blare, DO OB Fellow 11/23/2016, 11:11 AM

## 2016-11-24 ENCOUNTER — Encounter (HOSPITAL_COMMUNITY): Payer: Self-pay

## 2016-11-24 DIAGNOSIS — Z3A41 41 weeks gestation of pregnancy: Secondary | ICD-10-CM

## 2016-11-24 DIAGNOSIS — O480 Post-term pregnancy: Secondary | ICD-10-CM

## 2016-11-24 DIAGNOSIS — O99824 Streptococcus B carrier state complicating childbirth: Secondary | ICD-10-CM

## 2016-11-24 LAB — CBC
HEMATOCRIT: 32 % — AB (ref 36.0–46.0)
Hemoglobin: 10.8 g/dL — ABNORMAL LOW (ref 12.0–15.0)
MCH: 32.8 pg (ref 26.0–34.0)
MCHC: 33.8 g/dL (ref 30.0–36.0)
MCV: 97.3 fL (ref 78.0–100.0)
PLATELETS: 187 10*3/uL (ref 150–400)
RBC: 3.29 MIL/uL — ABNORMAL LOW (ref 3.87–5.11)
RDW: 13.1 % (ref 11.5–15.5)
WBC: 14.8 10*3/uL — AB (ref 4.0–10.5)

## 2016-11-24 MED ORDER — DIBUCAINE 1 % RE OINT: 1.0000 "application " | TOPICAL_OINTMENT | RECTAL | Status: DC | PRN

## 2016-11-24 MED ORDER — COCONUT OIL OIL
1.0000 "application " | TOPICAL_OIL | Status: DC | PRN
  Administered 2016-11-25: 1 via TOPICAL
  Filled 2016-11-24: qty 120

## 2016-11-24 MED ORDER — DIPHENHYDRAMINE HCL 25 MG PO CAPS: 25.0000 mg | ORAL_CAPSULE | Freq: Four times a day (QID) | ORAL | Status: DC | PRN

## 2016-11-24 MED ORDER — HYDROXYZINE HCL 50 MG PO TABS
50.0000 mg | ORAL_TABLET | Freq: Once | ORAL | Status: AC
  Administered 2016-11-24: 50 mg via ORAL
  Filled 2016-11-24 (×2): qty 1

## 2016-11-24 MED ORDER — METHYLERGONOVINE MALEATE 0.2 MG/ML IJ SOLN
INTRAMUSCULAR | Status: AC
  Filled 2016-11-24: qty 1

## 2016-11-24 MED ORDER — SIMETHICONE 80 MG PO CHEW: 80.0000 mg | CHEWABLE_TABLET | ORAL | Status: DC | PRN

## 2016-11-24 MED ORDER — METHYLERGONOVINE MALEATE 0.2 MG/ML IJ SOLN
0.2000 mg | Freq: Once | INTRAMUSCULAR | Status: AC
  Administered 2016-11-24: 0.2 mg via INTRAMUSCULAR

## 2016-11-24 MED ORDER — ONDANSETRON HCL 4 MG/2ML IJ SOLN: 4.0000 mg | INTRAMUSCULAR | Status: DC | PRN

## 2016-11-24 MED ORDER — PRENATAL MULTIVITAMIN CH
1.0000 | ORAL_TABLET | Freq: Every day | ORAL | Status: DC
  Administered 2016-11-24 – 2016-11-26 (×3): 1 via ORAL
  Filled 2016-11-24 (×3): qty 1

## 2016-11-24 MED ORDER — ZOLPIDEM TARTRATE 5 MG PO TABS: 5.0000 mg | ORAL_TABLET | Freq: Every evening | ORAL | Status: DC | PRN

## 2016-11-24 MED ORDER — SENNOSIDES-DOCUSATE SODIUM 8.6-50 MG PO TABS
2.0000 | ORAL_TABLET | ORAL | Status: DC
  Administered 2016-11-25 (×2): 2 via ORAL
  Filled 2016-11-24 (×2): qty 2

## 2016-11-24 MED ORDER — BENZOCAINE-MENTHOL 20-0.5 % EX AERO: 1.0000 "application " | INHALATION_SPRAY | CUTANEOUS | Status: DC | PRN

## 2016-11-24 MED ORDER — ONDANSETRON HCL 4 MG PO TABS: 4.0000 mg | ORAL_TABLET | ORAL | Status: DC | PRN

## 2016-11-24 MED ORDER — IBUPROFEN 600 MG PO TABS
600.0000 mg | ORAL_TABLET | Freq: Four times a day (QID) | ORAL | Status: DC
  Administered 2016-11-24 – 2016-11-26 (×10): 600 mg via ORAL
  Filled 2016-11-24 (×10): qty 1

## 2016-11-24 MED ORDER — ACETAMINOPHEN 325 MG PO TABS
650.0000 mg | ORAL_TABLET | ORAL | Status: DC | PRN
  Administered 2016-11-24 – 2016-11-25 (×7): 650 mg via ORAL
  Filled 2016-11-24 (×7): qty 2

## 2016-11-24 MED ORDER — WITCH HAZEL-GLYCERIN EX PADS: 1.0000 "application " | MEDICATED_PAD | CUTANEOUS | Status: DC | PRN

## 2016-11-24 MED ORDER — MISOPROSTOL 200 MCG PO TABS
800.0000 ug | ORAL_TABLET | Freq: Once | ORAL | Status: AC
  Administered 2016-11-24: 800 ug via BUCCAL

## 2016-11-24 MED ORDER — TETANUS-DIPHTH-ACELL PERTUSSIS 5-2.5-18.5 LF-MCG/0.5 IM SUSP: 0.5000 mL | Freq: Once | INTRAMUSCULAR | Status: DC

## 2016-11-24 NOTE — Progress Notes (Signed)
Patient is a 31 y.o. now [id number] s/p NSVD at [redacted]w[redacted]d, who was admitted for IOL d/t postdates.  Delivery Note At 1:52 AM a viable female was delivered via Vaginal, Spontaneous (Presentation: cephalic;ROA ).  APGAR: 6, 9; weight 8 lb 5.7 oz (3790 g).   Placenta status: spontaneous, intact.  Cord: 3-vessel  Anesthesia: Epidural Episiotomy: None Lacerations: None Est. Blood Loss (mL): 1000  Mom to postpartum.  Baby to Couplet care / Skin to Skin  Head delivered Right OA over intact perineum. No nuchal cord present. Infant with spontaneous cry, placed on mother's abdomen, dried and bulb suctioned. Cord clamped x 2 after 1-minute delay, and cut by Dr. Dannielle Huh. Cord blood drawn. Placenta delivered spontaneously with gentle cord traction. Fundus firm with massage. Perineum inspected and found to have no lacerations. Patient was given an intramuscular injection of methergin 0.2 mg/mL to decrease blood loss. Patient had 1047mL in postpartum bleeding that eventually subsided. Counts of sharps, instruments, and lap pads were all correct.   Jenell Milliner Medical Student 11/24/16, 2:27 AM

## 2016-11-24 NOTE — Progress Notes (Signed)
Attempted to notify MD of elevated temp but she is in a delivery- will call when free

## 2016-11-25 NOTE — Lactation Note (Signed)
This note was copied from a baby's chart. Lactation Consultation Note  Patient Name: Dawn Brock [id number] Date: 11/25/2016   I shared my concerns with Dr. Excell Seltzer that Mom was breastfeeding/pumping (although admission UDS was negative, Mom has a history of polysubstance drug abuse during the pregnancy). My concern was also communicated to nursing staff.  Matthias Hughs [facility] 11/25/2016, 9:32 PM

## 2016-11-25 NOTE — Anesthesia Postprocedure Evaluation (Signed)
Anesthesia Post Note  Patient: Dawn Brock  Procedure(s) Performed: AN AD HOC LABOR EPIDURAL     Patient location during evaluation: Mother Baby Anesthesia Type: Epidural Level of consciousness: awake and alert and oriented Pain management: satisfactory to patient Vital Signs Assessment: post-procedure vital signs reviewed and stable Respiratory status: respiratory function stable Cardiovascular status: stable Postop Assessment: no headache, no backache, epidural receding, patient able to bend at knees, no signs of nausea or vomiting and adequate PO intake Anesthetic complications: no    Last Vitals:  Vitals:   11/24/16 1800 11/25/16 0604  BP: (!) 130/56 130/85  Pulse: 69 68  Resp: 18 16  Temp: 36.8 C 36.9 C  SpO2:      Last Pain:  Vitals:   11/25/16 1230  TempSrc:   PainSc: 5    Pain Goal: Patients Stated Pain Goal: 0 (11/25/16 0825)               Katherina Mires

## 2016-11-25 NOTE — Lactation Note (Signed)
This note was copied from a baby's chart. Lactation Consultation Note: Mother reports that she bottle fed 3 other children and she really wants to breastfeed this infant. Mother reports that infant has been cluster feeding. She reports painful nipples.  Observed nipples are cracked in the center of both nipples. Assist mother with positioning infant on the left breast in cross cradle hold. Infant latched on and mother reports pain scale of #6. Mother unable to tolerate more than 10 mins on and off. Nipple bleeding when infant released the breast.  Mother was given comfort gels and advised to have OB to call in [facility] . Observed that infant has a high palate and a short tight frenula. Infants tongue cups and has limited lateral movement. Advised mother to discuss with Peds .  Mother advised to take a breastfeed break and pump breast until nipples are healing. Staff nurse to sat up DEBP and mother to pump every 2-3 hours for 15 mins. Mother has supplemental guidelines and advised to offer ebm/formula. Mother has a hand pump. She is active with WIC. She reports that she plans to get a pump from [facility].   Patient Name: Boy Kaylanni Ezelle [id number] Date: 11/25/2016 Reason for consult: Initial assessment   Maternal Data    Feeding Feeding Type: Formula Length of feed: 10 min(on and off , mother with lots of pain, unable to tolerate fd)  LATCH Score Latch: Repeated attempts needed to sustain latch, nipple held in mouth throughout feeding, stimulation needed to elicit sucking reflex.  Audible Swallowing: A few with stimulation  Type of Nipple: Everted at rest and after stimulation  Comfort (Breast/Nipple): Engorged, cracked, bleeding, large blisters, severe discomfort(bilateral cracks in the center of her nipples)  Hold (Positioning): Assistance needed to correctly position infant at breast and maintain latch.  LATCH Score: 5  Interventions Interventions: Assisted with latch;Skin to  skin;Hand express;Breast compression;Adjust position;Support pillows;Position options;Expressed milk;Comfort gels  Lactation Tools Discussed/Used     Consult Status Consult Status: Follow-up Date: 11/26/16 Follow-up type: In-patient    Jess Barters [facility] 11/25/2016, 4:12 PM

## 2016-11-25 NOTE — Progress Notes (Signed)
Post Partum Day 1 Subjective: up ad lib, voiding and tolerating PO. C/o some back pain with numbness in right leg  Objective: Blood pressure 130/85, pulse 68, temperature 98.4 F (36.9 C), temperature source Oral, resp. rate 16, height 5\' 7"  (1.702 m), weight 216 lb (98 kg), last menstrual period 02/10/2016, SpO2 99 %, unknown if currently breastfeeding.  Physical Exam:  General: alert, cooperative and no distress Lochia: appropriate Uterine Fundus: firm DVT Evaluation: No evidence of DVT seen on physical exam. Negative Homan's sign. No cords or calf tenderness. No significant calf/ankle edema.  Recent Labs    11/23/16 0854 11/24/16 0633  HGB 11.7* 10.8*  HCT 35.6* 32.0*    Assessment/Plan: Plan for discharge tomorrow, Breastfeeding, Lactation consult and Social Work consult   LOS: 2 days   Truett Mainland 11/25/2016, 7:08 AM

## 2016-11-25 NOTE — Clinical Social Work Maternal (Signed)
CLINICAL SOCIAL WORK MATERNAL/CHILD NOTE  Patient Details  Name: Dawn Brock MRN: [id number] Date of Birth: 09/12/1985  Date:  11/25/2016  Clinical Social Worker Initiating Note:  Dawn Brock Date/Time: Initiated:  11/25/16/1036     Child's Name:  Dawn Brock   Biological Parents:  Mother   Need for Interpreter:  None   Reason for Referral:  Current Substance Use/Substance Use During Pregnancy , Late or No Prenatal Care    Address:  [address] [place]  [address]    Phone number:  [phone] (home)     Additional phone number: MOB's lives with MOB's cousin (Dawn Brock) [phone].  Household Members/Support Persons (HM/SP):   Household Member/Support Person 1, Household Member/Support Person 2, Household Member/Support Person 3, Household Member/Support Person 4   HM/SP Name Relationship DOB or Age  HM/SP -1 Dawn Brock daughter 06/26/1004  HM/SP -2 Dawn Brock daughter 03/25/2006  HM/SP -3 Dawn Brock daughter 02/22/2013  HM/SP -4 Dawn Brock cousin unknown  HM/SP -5        HM/SP -6        HM/SP -7        HM/SP -8          Natural Supports (not living in the home):  Spouse/significant other   Professional Supports: Case Manager/Social Worker(MOB's OBCM is Dawn Brock.  MOB also receives outpatient counseling at [facility].)   Employment: Full-time   Type of Work: [employer]   Education:  9 to 11 years   Homebound arranged: No  Financial Resources:  Medicaid   Other Resources:  WIC, Food Stamps    Cultural/Religious Considerations Which May Impact Care:  Per MOB's Face Sheet, MOB is Baptist.   Strengths:  Ability to meet basic needs    Psychotropic Medications:         Pediatrician:       Pediatrician List:   [place]    [place]    [place]    [place]    [place]    [place]      Pediatrician Fax Number:    Risk Factors/Current Problems:   Substance Use , DHHS Involvement    Cognitive State:  Able to Concentrate , Alert , Insightful , Linear Thinking    Mood/Affect:  Bright , Interested , Relaxed , Calm , Comfortable    CSW Assessment: CSW met with MOB to complete an assessment for hx of SA.  When CSW arrived, MOB was bonding with baby as evidence by engaging in breastfeeding.  MOB made CSW aware that MOB was having challenges with breastfeeding but was not willing to give up.  CSW offered to inform MOB's nurse of MOB's challenges and MOB was receptive (MOB notified bedside nurse that MOB was in need of assistance with breastfeeding). MOB appeared to be in pain as she breastfeed, however, she appeared consistent and determined.   CSW explained CSW's role and MOB was receptive, forthcoming, and engaged.  CSW asked about MOB's SA hx and MOB openly shared that MOB utilized THC and cocaine during pregnancy.  MOB reported MOB's last use of THC was February 2018 and last use of Cocaine was August 2018.  MOB denied having a SA problem, however informed CSW that MOB was receiving SA counseling with [facility]. CSW praised MOB for attending counseling and encouraged MOB to continue with counseling services.  CSW made MOB aware of the hospital's SA policy and invited MOB to ask questions.   CSW informed MOB that infant's UDS was negative and CSW will continue to monitor infant's CDS and will make a report to [place] CPS if warranted.  Per MOB, MOB has an open CPS case with [place] CPS, Dawn Brock ([phone]). CSW informed MOB that CSW will contact CPS to verify MOB has an open case and to clarify there are no barriers to infant d/c to MOB. CSW contacted CPS and MOB does have an open case and there are currently no barriers to infant d/c to MOB.    CSW observed a new car seat in MOB's room and MOB reported that MOB has all other necessary items for infant.  MOB shared with CSW that MOB was uncertain of  infant's father, and MOB plans to get a paternity test in the near future.  However, Dawn Brock(09/10/1984 the father of MOB's 3rd child) has been visiting MOB and infant and has been supportive.   CSW reviewed safe sleep and PPD.  CSW will continue to provided resources and support to family after discharge.   CSW Plan/Description:  Perinatal Mood and Anxiety Disorder (PMADs) Education, Hospital Drug Screen Policy Information, No Further Intervention Required/No Barriers to Discharge, Sudden Infant Death Syndrome (SIDS) Education, Other Information/Referral to Community Resources, CSW Will Continue to Monitor Umbilical Cord Tissue Drug Screen Results and Make Report if Warranted   Alucard Fearnow Brock, MSW, LCSW Clinical Social Work [phone]  Koleman Marling D BOYD-GILYARD, LCSW 11/25/2016, 3:03 PM 

## 2016-11-25 NOTE — Progress Notes (Signed)
During today's shift, MOB has been working on breastfeeding.  Baby was having difficulty properly latching which resulted in the MOB having very sore and cracked nipples.  MOB was instructed to take a "24 hour break" from breastfeeding with baby on the breast, but rather use the double pump to express breast milk and supplement with Dory Horn formula for feeds if she is unable to express enough milk.  When we went in around 1630 to assess baby, we were able to set MOB with the double pump.  MOB was able to successfully express ~13 ml of colostrum, which we fed to baby using a curved syringe.  MOB preferred to use the curved syringe to feed the baby for the past couple of feeds. We gave baby ~7 ml of Gerber formula during the 1630 feeding.  Baby up until this point had a very high-pitched cry and was not able to be consoled.  MOB's self confidence seems to be improving now that we helped set her up with the double breast pump.  She said "she never had issues with producing enough milk".  MOB was also educated on using coconut oil or comfort gels (not using together).

## 2016-11-26 MED ORDER — PNEUMOCOCCAL VAC POLYVALENT 25 MCG/0.5ML IJ INJ
0.5000 mL | INJECTION | INTRAMUSCULAR | Status: DC
  Filled 2016-11-26: qty 0.5

## 2016-11-26 MED ORDER — IBUPROFEN 600 MG PO TABS: 600.0000 mg | ORAL_TABLET | Freq: Four times a day (QID) | ORAL | 0 refills | Status: DC

## 2016-11-26 NOTE — Discharge Summary (Signed)
OB Discharge Summary     Patient Name: Dawn Brock DOB: [birth year] MRN: [id number] Date of admission: 11/23/2016  Delivering MD: Jenell Milliner )  Date of discharge: 11/26/2016    Admitting diagnosis: postdates pregnancy Intrauterine pregnancy: [redacted]w[redacted]d    Secondary diagnosis:  Active Problems:   Patient Active Problem List   Diagnosis Date Noted  . Post-dates pregnancy 11/23/2016  . Indication for care in labor or delivery 11/23/2016  . H/O LEEP 08/21/2016  . Limited prenatal care in second trimester 08/21/2016  . Headache 08/21/2016  . Mild tetrahydrocannabinol (THC) abuse 08/21/2016  . History of substance abuse 08/21/2016  . Cocaine abuse complicating pregnancy ([place]) 02/22/2013  . Alcohol dependence ([place]) 03/12/2012  . Cannabis dependence ([place]) 03/12/2012  . Executive function deficit 03/11/2012    Class: Acute  . Cigarette smoker 03/10/2012  . Duodenitis with nausea, vomiting, diarrhea and abdominal pain 03/10/2012    Additional problems: none     Discharge diagnosis: Term Pregnancy Delivered                                                                                                Post partum procedures:none  Complications: [id number]  Hospital course:  Induction of Labor With Vaginal Delivery   31 y.o. yo [id number] at [redacted]w[redacted]d was admitted to the hospital 11/23/2016 for induction of labor.  Indication for induction: Postdates.  Patient had an uncomplicated labor course as follows: Membrane Rupture Time/Date: 8:35 PM ,11/23/2016   Intrapartum Procedures: Episiotomy: None [1]                                         Lacerations:  None [1]  Patient had delivery of a Viable infant.  Information for the patient's newborn:  [place], Verne [[id number]]  Delivery Method: Vag-Spont   11/24/2016  Details of delivery can be found in separate delivery note.  Patient had a routine postpartum course. Patient is discharged home 11/26/16.  Physical  exam  Vitals:   11/25/16 1802 11/26/16 0550  BP: 124/62 127/79  Pulse: 74 65  Resp: 18 14  Temp: 98.1 F (36.7 C) 98.3 F (36.8 C)  SpO2: 100%     General: alert, cooperative and no distress Lochia: appropriate Uterine Fundus: firm Incision: N/A DVT Evaluation: No evidence of DVT seen on physical exam.  Labs: No results found for this or any previous visit (from the past 24 hour(s)).   Discharge instruction: per After Visit Summary and "Baby and Me Booklet".  After visit meds:  Allergies  Allergen Reactions  . Tramadol Nausea And Vomiting    Allergies as of 11/26/2016      Reactions   Tramadol Nausea And Vomiting      Medication List    STOP taking these medications   amoxicillin-clavulanate 875-125 MG tablet Commonly known as:  AUGMENTIN   clindamycin 1 % external solution Commonly known as:  CLEOCIN T   cyclobenzaprine 5 MG tablet Commonly known as:  FLEXERIL   pantoprazole 20 MG tablet  Commonly known as:  PROTONIX   promethazine 25 MG tablet Commonly known as:  PHENERGAN     TAKE these medications   acetaminophen 325 MG tablet Commonly known as:  TYLENOL Take 325 mg by mouth every 6 (six) hours as needed for headache.   ibuprofen 600 MG tablet Commonly known as:  ADVIL,MOTRIN Take 1 tablet (600 mg total) every 6 (six) hours by mouth.   prenatal multivitamin Tabs tablet Take 1 tablet by mouth daily at 12 noon.        Diet: routine diet  Activity: Advance as tolerated. Pelvic rest for 6 weeks.   Outpatient follow up:4 weeks Future Appointments: No future appointments.  Follow up Appt: No Follow-up on file.     Postpartum contraception: IUD  Newborn Data: APGAR (1 MIN): 6   APGAR (5 MINS): 9     Baby Feeding: Bottle and Breast Disposition:home with mother  Dannielle Huh, DO  11/26/2016

## 2016-11-26 NOTE — Discharge Instructions (Signed)

## 2016-11-26 NOTE — Lactation Note (Signed)
This note was copied from a baby's chart. Lactation Consultation Note  Patient Name: Dawn Brock [id number] Date: 11/26/2016 Reason for consult: Follow-up assessment(Hx of polysubstance abuse.)  Baby 41 hours old. Mom reports that she intends to pump and bottle-feed and no longer wants to put baby to the breast. Mom states that she did not nurse or offer breast milk to older 3 children, but is determined to feed this baby with EBM. Reiterated to mom that she cannot take any illegal substances while baby consuming EBM. Mom reports that she understands that anything she would ingest could get to the baby through her breast milk. Enc mom to use EBM on nipple for healing, and mom states that she has comfort gels to wear as well. Reviewed engorgement prevention/treatment.   Mom states that she spoke with [facility] yesterday and they are not going to provide a DEBP unless mom returns to work or school--separated from baby. Reviewed how to use manual pump and piston in her pumping kit. Enc mom to take entire kit with her at D/C. Mom aware of [employer] at [facility]. Mom aware of OP/BFSG and [place] phone line assistance after D/C.   Maternal Data    Feeding Feeding Type: Formula Nipple Type: Slow - flow  LATCH Score                   Interventions Interventions: Hand pump  Lactation Tools Discussed/Used Pump Review: Setup, frequency, and cleaning;Milk Storage Initiated by:: bedside RN Date initiated:: 11/24/16   Consult Status Consult Status: PRN    Andres Labrum 11/26/2016, 1:15 PM

## 2016-11-27 LAB — BPAM RBC
Blood Product Expiration Date: 201811292359
Blood Product Expiration Date: 201812062359
ISSUE DATE / TIME: 201811160001
Unit Type and Rh: 5100
Unit Type and Rh: 5100

## 2016-11-27 LAB — TYPE AND SCREEN
ABO/RH(D): O POS
Antibody Screen: POSITIVE
UNIT DIVISION: 0
Unit division: 0

## 2016-12-08 NOTE — Progress Notes (Signed)
CSW made [facility] CPS report for infant's positive CDS for Benzoylecgonine (Cocaine). CSW will follow-up with family within 24 hours.   Laurey Arrow, MSW, LCSW Clinical Social Work [phone]

## 2017-03-28 ENCOUNTER — Encounter (HOSPITAL_COMMUNITY): Payer: Self-pay | Admitting: *Deleted

## 2017-03-28 ENCOUNTER — Other Ambulatory Visit: Payer: Self-pay

## 2017-03-28 DIAGNOSIS — Z79899 Other long term (current) drug therapy: Secondary | ICD-10-CM | POA: Insufficient documentation

## 2017-03-28 DIAGNOSIS — F17210 Nicotine dependence, cigarettes, uncomplicated: Secondary | ICD-10-CM | POA: Insufficient documentation

## 2017-03-28 DIAGNOSIS — B9789 Other viral agents as the cause of diseases classified elsewhere: Secondary | ICD-10-CM | POA: Diagnosis not present

## 2017-03-28 DIAGNOSIS — R05 Cough: Secondary | ICD-10-CM | POA: Diagnosis present

## 2017-03-28 DIAGNOSIS — J069 Acute upper respiratory infection, unspecified: Secondary | ICD-10-CM | POA: Diagnosis not present

## 2017-03-28 NOTE — ED Triage Notes (Signed)
The pt is  C/o a  Cold and a  Cough for 5 days  No temp productive  Cough yellow sputum.  Her entire family has been ill

## 2017-03-29 ENCOUNTER — Emergency Department (HOSPITAL_COMMUNITY)
Admission: EM | Admit: 2017-03-29 | Discharge: 2017-03-29 | Disposition: A | Discharge status: Home / Self Care | Payer: Medicaid Other | Attending: Emergency Medicine | Admitting: Emergency Medicine

## 2017-03-29 DIAGNOSIS — J069 Acute upper respiratory infection, unspecified: Secondary | ICD-10-CM

## 2017-03-29 DIAGNOSIS — B9789 Other viral agents as the cause of diseases classified elsewhere: Secondary | ICD-10-CM

## 2017-03-29 MED ORDER — AMOXICILLIN 500 MG PO CAPS: 500.0000 mg | ORAL_CAPSULE | Freq: Three times a day (TID) | ORAL | 0 refills | Status: DC

## 2017-03-29 NOTE — Discharge Instructions (Signed)
Continue over the counter medications as needed for relief of symptoms.  If your symptoms have not improved in the next 2-3 days, fill the prescription for amoxicillin you have been provided with this evening.  Return to the emergency department if your symptoms significantly worsen or change.

## 2017-03-29 NOTE — ED Provider Notes (Signed)
[facility] EMERGENCY DEPARTMENT Provider Note   CSN: [id number] Arrival date & time: 03/28/17  2227     History   Chief Complaint Chief Complaint  Patient presents with  . Cough    HPI Dawn Brock is a 32 y.o. female.  Patient is a 32 year old female presenting with complaints of congestion, sinus pressure, cough productive of sputum, and generalized malaise for the past 5 days.  She denies any definite fevers.  She denies any vomiting or diarrhea.  She has tried over-the-counter medications with little relief.  She reports several members of her family have been ill in a similar fashion.   The history is provided by the patient.  Cough  This is a new problem. Episode onset: 5 days ago. The problem occurs constantly. The problem has not changed since onset.The cough is productive of sputum. There has been no fever. Associated symptoms include ear pain, headaches and myalgias. Pertinent negatives include no chills, no ear congestion and no rhinorrhea. She has tried nothing for the symptoms. She is a smoker.    Past Medical History:  Diagnosis Date  . Abnormal Pap smear   . Anemia   . Gastritis   . Hx of chlamydia infection   . Vaginal Pap smear, abnormal     Patient Active Problem List   Diagnosis Date Noted  . Post-dates pregnancy 11/23/2016  . Indication for care in labor or delivery 11/23/2016  . H/O LEEP 08/21/2016  . Limited prenatal care in second trimester 08/21/2016  . Headache 08/21/2016  . Mild tetrahydrocannabinol (THC) abuse 08/21/2016  . History of substance abuse 08/21/2016  . Cocaine abuse complicating pregnancy ([place]) 02/22/2013  . Alcohol dependence ([place]) 03/12/2012  . Cannabis dependence ([place]) 03/12/2012  . Executive function deficit 03/11/2012    Class: Acute  . Cigarette smoker 03/10/2012  . Duodenitis with nausea, vomiting, diarrhea and abdominal pain 03/10/2012    Past Surgical History:  Procedure Laterality Date    . HERNIA REPAIR     As an infant  . LEEP      OB History    Gravida Para Term Preterm AB Living   4 4 4  0 0 4   SAB TAB Ectopic Multiple Live Births   0 0 0 0 4       Home Medications    Prior to Admission medications   Medication Sig Start Date End Date Taking? Authorizing Provider  acetaminophen (TYLENOL) 325 MG tablet Take 325 mg by mouth every 6 (six) hours as needed for headache.    [provider]  ibuprofen (ADVIL,MOTRIN) 600 MG tablet Take 1 tablet (600 mg total) every 6 (six) hours by mouth. 11/26/16   Moss, [employer], DO  Prenatal Vit-Fe Fumarate-FA (PRENATAL MULTIVITAMIN) TABS tablet Take 1 tablet by mouth daily at 12 noon.    [provider]    Family History Family History  Problem Relation Age of Onset  . Hypertension Mother   . Fibromyalgia Mother   . Cirrhosis Father   . Kidney disease Father   . Asthma Brother   . Birth defects Son        hirschprungs    Social History Social History   Tobacco Use  . Smoking status: Current Every Day Smoker    Packs/day: 0.50    Years: 14.00    Pack years: 7.00    Types: Cigarettes  . Smokeless tobacco: Never Used  Substance Use Topics  . Alcohol use: Yes  Alcohol/week: 0.0 oz    Comment: Not since June 2014  . Drug use: Yes    Types: Cocaine, Marijuana    Comment: Patient denies any recent use of Marijuna and cocaine as of 07/02/12    LAST SMOKED  MARIJUANA-   LAST WEEK LAST USED COCAINE-    2015     Allergies   Tramadol   Review of Systems Review of Systems  Constitutional: Negative for chills.  HENT: Positive for ear pain. Negative for rhinorrhea.   Respiratory: Positive for cough.   Musculoskeletal: Positive for myalgias.  Neurological: Positive for headaches.  All other systems reviewed and are negative.    Physical Exam Updated Vital Signs BP (!) 144/92 (BP Location: Left Arm)   Pulse 87   Temp 98.3 F (36.8 C) (Oral)   Resp 18   Ht 5\' 6"  (1.676 m)   Wt 90.7 kg (200  lb)   SpO2 100%   BMI 32.28 kg/m   Physical Exam  Constitutional: She is oriented to person, place, and time. She appears well-developed and well-nourished. No distress.  HENT:  Head: Normocephalic and atraumatic.  Mouth/Throat: Oropharynx is clear and moist. No oropharyngeal exudate.  TM's clear bilaterally.  Neck: Normal range of motion. Neck supple.  Cardiovascular: Normal rate and regular rhythm. Exam reveals no gallop and no friction rub.  No murmur heard. Pulmonary/Chest: Effort normal and breath sounds normal. No respiratory distress. She has no wheezes.  Abdominal: Soft. Bowel sounds are normal. She exhibits no distension. There is no tenderness.  Musculoskeletal: Normal range of motion.  Neurological: She is alert and oriented to person, place, and time.  Skin: Skin is warm and dry. She is not diaphoretic.  Nursing note and vitals reviewed.    ED Treatments / Results  Labs (all labs ordered are listed, but only abnormal results are displayed) Labs Reviewed - No data to display  EKG  EKG Interpretation None       Radiology No results found.  Procedures Procedures (including critical care time)  Medications Ordered in ED Medications - No data to display   Initial Impression / Assessment and Plan / ED Course  I have reviewed the triage vital signs and the nursing notes.  Pertinent labs & imaging results that were available during my care of the patient were reviewed by me and considered in my medical decision making (see chart for details).  Patient presents with URI symptoms over the past 5 days.  She has had no relief with over-the-counter medications.  She believes she has a sinus infection.  I highly suspect this illness is viral in nature.  I have advised her to continue over-the-counter medications and give the situation time.  She will be prescribed amoxicillin.  If she is not improving in the next 2-3 days, I have advised her to fill this prescription.     Final Clinical Impressions(s) / ED Diagnoses   Final diagnoses:  None    ED Discharge Orders    None       Veryl Speak, MD 03/29/17 [phone]

## 2017-05-10 ENCOUNTER — Emergency Department (HOSPITAL_COMMUNITY)
Admission: EM | Admit: 2017-05-10 | Discharge: 2017-05-10 | Disposition: A | Discharge status: Home / Self Care | Payer: Medicaid Other | Attending: Emergency Medicine | Admitting: Emergency Medicine

## 2017-05-10 ENCOUNTER — Other Ambulatory Visit: Payer: Self-pay

## 2017-05-10 ENCOUNTER — Encounter (HOSPITAL_COMMUNITY): Payer: Self-pay

## 2017-05-10 DIAGNOSIS — F17210 Nicotine dependence, cigarettes, uncomplicated: Secondary | ICD-10-CM | POA: Diagnosis not present

## 2017-05-10 DIAGNOSIS — H1031 Unspecified acute conjunctivitis, right eye: Secondary | ICD-10-CM | POA: Diagnosis not present

## 2017-05-10 DIAGNOSIS — H5711 Ocular pain, right eye: Secondary | ICD-10-CM | POA: Diagnosis present

## 2017-05-10 DIAGNOSIS — Z79899 Other long term (current) drug therapy: Secondary | ICD-10-CM | POA: Insufficient documentation

## 2017-05-10 MED ORDER — TETRACAINE HCL 0.5 % OP SOLN
1.0000 [drp] | Freq: Once | OPHTHALMIC | Status: AC
  Administered 2017-05-10: 1 [drp] via OPHTHALMIC
  Filled 2017-05-10: qty 4

## 2017-05-10 MED ORDER — FLUORESCEIN SODIUM 1 MG OP STRP
ORAL_STRIP | OPHTHALMIC | Status: AC
  Filled 2017-05-10: qty 1

## 2017-05-10 MED ORDER — ERYTHROMYCIN 5 MG/GM OP OINT: TOPICAL_OINTMENT | OPHTHALMIC | 0 refills | Status: DC

## 2017-05-10 MED ORDER — FLUORESCEIN SODIUM 1 MG OP STRP
1.0000 | ORAL_STRIP | Freq: Once | OPHTHALMIC | Status: AC
  Administered 2017-05-10: 1 via OPHTHALMIC
  Filled 2017-05-10: qty 1

## 2017-05-10 NOTE — ED Triage Notes (Signed)
Pt presents for evaluation of possible conjunctivitis to R eye x 2 days. Reports itching, tearing, and crusting.

## 2017-05-10 NOTE — ED Provider Notes (Signed)
[facility] EMERGENCY DEPARTMENT Provider Note   CSN: [id number] Arrival date & time: 05/10/17  1123     History   Chief Complaint Chief Complaint  Patient presents with  . Eye Pain    HPI Dawn Brock is a 32 y.o. female.  HPI   32 year old female presents today with complaints of right eye discomfort.  Patient notes symptoms started yesterday with itchy watery red eye with foreign body sensation.  She denies any significant pain, notes some minor surrounding edema, denies any fever, painful ocular movements, or any other infectious etiology.  No trauma to the eye or known foreign body exposure.   Past Medical History:  Diagnosis Date  . Abnormal Pap smear   . Anemia   . Gastritis   . Hx of chlamydia infection   . Vaginal Pap smear, abnormal     Patient Active Problem List   Diagnosis Date Noted  . Post-dates pregnancy 11/23/2016  . Indication for care in labor or delivery 11/23/2016  . H/O LEEP 08/21/2016  . Limited prenatal care in second trimester 08/21/2016  . Headache 08/21/2016  . Mild tetrahydrocannabinol (THC) abuse 08/21/2016  . History of substance abuse 08/21/2016  . Cocaine abuse complicating pregnancy ([place]) 02/22/2013  . Alcohol dependence ([place]) 03/12/2012  . Cannabis dependence ([place]) 03/12/2012  . Executive function deficit 03/11/2012    Class: Acute  . Cigarette smoker 03/10/2012  . Duodenitis with nausea, vomiting, diarrhea and abdominal pain 03/10/2012    Past Surgical History:  Procedure Laterality Date  . HERNIA REPAIR     As an infant  . LEEP       OB History    Gravida  4   Para  4   Term  4   Preterm  0   AB  0   Living  4     SAB  0   TAB  0   Ectopic  0   Multiple  0   Live Births  4            Home Medications    Prior to Admission medications   Medication Sig Start Date End Date Taking? Authorizing Provider  acetaminophen (TYLENOL) 325 MG tablet Take 325 mg by mouth every 6  (six) hours as needed for headache.    [provider]  amoxicillin (AMOXIL) 500 MG capsule Take 1 capsule (500 mg total) by mouth 3 (three) times daily. 03/29/17   Veryl Speak, MD  erythromycin ophthalmic ointment Place a 1/2 inch ribbon of ointment into the lower eyelid 4 times daily for 5 days 05/10/17   Chisom Aust, Dellis Filbert, PA-C  ibuprofen (ADVIL,MOTRIN) 600 MG tablet Take 1 tablet (600 mg total) every 6 (six) hours by mouth. 11/26/16   Moss, Amber, DO  Prenatal Vit-Fe Fumarate-FA (PRENATAL MULTIVITAMIN) TABS tablet Take 1 tablet by mouth daily at 12 noon.    [provider]    Family History Family History  Problem Relation Age of Onset  . Hypertension Mother   . Fibromyalgia Mother   . Cirrhosis Father   . Kidney disease Father   . Asthma Brother   . Birth defects Son        hirschprungs    Social History Social History   Tobacco Use  . Smoking status: Current Every Day Smoker    Packs/day: 0.50    Years: 14.00    Pack years: 7.00    Types: Cigarettes  . Smokeless tobacco: Never Used  Substance  Use Topics  . Alcohol use: Yes    Alcohol/week: 0.0 oz    Comment: Not since June 2014  . Drug use: Yes    Types: Cocaine, Marijuana    Comment: Patient denies any recent use of Marijuna and cocaine as of 07/02/12    LAST SMOKED  MARIJUANA-   LAST WEEK LAST USED COCAINE-    2015     Allergies   Tramadol   Review of Systems Review of Systems  All other systems reviewed and are negative.   Physical Exam Updated Vital Signs BP (!) 138/96 (BP Location: Right Arm)   Pulse 76   Temp 98.8 F (37.1 C) (Oral)   Resp 16   LMP 05/08/2017 (Approximate)   SpO2 100%   Physical Exam  Constitutional: She is oriented to person, place, and time. She appears well-developed and well-nourished.  HENT:  Head: Normocephalic and atraumatic.  Right palpebral and bulbar conjunctival injection, watery discharge, no purulence, pupils equal round reactive to light,  extraocular movements pain-free and intact, no surrounding edema or redness  No uptake on fluorescein stain  Eyes: Pupils are equal, round, and reactive to light. Conjunctivae are normal. Right eye exhibits no discharge. Left eye exhibits no discharge. No scleral icterus.  Neck: Normal range of motion. No JVD present. No tracheal deviation present.  Pulmonary/Chest: Effort normal. No stridor.  Neurological: She is alert and oriented to person, place, and time. Coordination normal.  Psychiatric: She has a normal mood and affect. Her behavior is normal. Judgment and thought content normal.  Nursing note and vitals reviewed.    ED Treatments / Results  Labs (all labs ordered are listed, but only abnormal results are displayed) Labs Reviewed - No data to display  EKG None  Radiology No results found.  Procedures Procedures (including critical care time)  Medications Ordered in ED Medications  fluorescein 1 MG ophthalmic strip (has no administration in time range)  tetracaine (PONTOCAINE) 0.5 % ophthalmic solution 1 drop (1 drop Right Eye Given by Other 05/10/17 1425)  fluorescein ophthalmic strip 1 strip (1 strip Right Eye Given by Other 05/10/17 1425)     Initial Impression / Assessment and Plan / ED Course  I have reviewed the triage vital signs and the nursing notes.  Pertinent labs & imaging results that were available during my care of the patient were reviewed by me and considered in my medical decision making (see chart for details).       Final Clinical Impressions(s) / ED Diagnoses   Final diagnoses:  Acute conjunctivitis of right eye, unspecified acute conjunctivitis type    Labs:   Imaging:  Consults:  Therapeutics:  Discharge Meds: Erythromycin  Assessment/Plan: Patient's presentation is most consistent with conjunctivitis.  She is well-appearing in no acute distress.  No foreign bodies, no uptake on fluorescein stain, patient be treated with  erythromycin encouraged to return immediately if she develops any new or worsening signs or symptoms.  Patient verbalized understanding and agreement to today's plan.     ED Discharge Orders        Ordered    erythromycin ophthalmic ointment     05/10/17 1521       Francee Gentile 05/10/17 1523    Tanna Furry, MD 05/15/17 [phone]

## 2017-05-10 NOTE — Discharge Instructions (Addendum)
Please read attached information. If you experience any new or worsening signs or symptoms please return to the emergency room for evaluation. Please follow-up with your primary care provider or specialist as discussed. Please use medication prescribed only as directed and discontinue taking if you have any concerning signs or symptoms.   °

## 2017-08-17 ENCOUNTER — Encounter (HOSPITAL_COMMUNITY): Payer: Self-pay | Admitting: Emergency Medicine

## 2017-08-17 ENCOUNTER — Emergency Department (HOSPITAL_COMMUNITY)
Admission: EM | Admit: 2017-08-17 | Discharge: 2017-08-17 | Disposition: A | Discharge status: Home / Self Care | Payer: Medicaid Other | Attending: Emergency Medicine | Admitting: Emergency Medicine

## 2017-08-17 ENCOUNTER — Other Ambulatory Visit: Payer: Self-pay

## 2017-08-17 DIAGNOSIS — F1410 Cocaine abuse, uncomplicated: Secondary | ICD-10-CM | POA: Diagnosis not present

## 2017-08-17 DIAGNOSIS — R05 Cough: Secondary | ICD-10-CM | POA: Insufficient documentation

## 2017-08-17 DIAGNOSIS — F1210 Cannabis abuse, uncomplicated: Secondary | ICD-10-CM | POA: Diagnosis not present

## 2017-08-17 DIAGNOSIS — F17210 Nicotine dependence, cigarettes, uncomplicated: Secondary | ICD-10-CM | POA: Insufficient documentation

## 2017-08-17 DIAGNOSIS — J3489 Other specified disorders of nose and nasal sinuses: Secondary | ICD-10-CM | POA: Diagnosis not present

## 2017-08-17 DIAGNOSIS — B9789 Other viral agents as the cause of diseases classified elsewhere: Secondary | ICD-10-CM | POA: Insufficient documentation

## 2017-08-17 DIAGNOSIS — J069 Acute upper respiratory infection, unspecified: Secondary | ICD-10-CM | POA: Insufficient documentation

## 2017-08-17 DIAGNOSIS — R070 Pain in throat: Secondary | ICD-10-CM | POA: Diagnosis present

## 2017-08-17 LAB — GROUP A STREP BY PCR: Group A Strep by PCR: NOT DETECTED

## 2017-08-17 MED ORDER — BENZONATATE 100 MG PO CAPS
100.0000 mg | ORAL_CAPSULE | Freq: Three times a day (TID) | ORAL | 0 refills | Status: AC
Start: 2017-08-17 — End: 2017-08-24

## 2017-08-17 MED ORDER — FLUTICASONE PROPIONATE 50 MCG/ACT NA SUSP: 1.0000 | Freq: Every day | NASAL | 0 refills | Status: DC

## 2017-08-17 NOTE — ED Notes (Signed)
Pt verbalized understanding discharge instructions and denies any further needs or questions at this time. VS stable, ambulatory and steady gait.   

## 2017-08-17 NOTE — ED Provider Notes (Signed)
[facility] EMERGENCY DEPARTMENT Provider Note   CSN: [id number] Arrival date & time: 08/17/17  4010     History   Chief Complaint Chief Complaint  Patient presents with  . Sore Throat  . Nasal Congestion    HPI Dawn Brock is a 32 y.o. female.  32 y.o female with a PMH of anemia, gastritis presents to the ED with a chief complaint of sore throat x 2 days. Patient states everyone in her household has been sick over the past week with ear infections. Patient reports pain with swallowing liquids and solids but still tolerates them.She also reports a productive cough with yellow sputum, and rhinorrhea. She has tried ibuprofen, gargle salt water but states no relive in symptoms. She denies any voice changes, hoarness, fever, chest pain or shortness of breath.      Past Medical History:  Diagnosis Date  . Abnormal Pap smear   . Anemia   . Gastritis   . Hx of chlamydia infection   . Vaginal Pap smear, abnormal     Patient Active Problem List   Diagnosis Date Noted  . Post-dates pregnancy 11/23/2016  . Indication for care in labor or delivery 11/23/2016  . H/O LEEP 08/21/2016  . Limited prenatal care in second trimester 08/21/2016  . Headache 08/21/2016  . Mild tetrahydrocannabinol (THC) abuse 08/21/2016  . History of substance abuse 08/21/2016  . Cocaine abuse complicating pregnancy ([place]) 02/22/2013  . Alcohol dependence ([place]) 03/12/2012  . Cannabis dependence ([place]) 03/12/2012  . Executive function deficit 03/11/2012    Class: Acute  . Cigarette smoker 03/10/2012  . Duodenitis with nausea, vomiting, diarrhea and abdominal pain 03/10/2012    Past Surgical History:  Procedure Laterality Date  . HERNIA REPAIR     As an infant  . LEEP       OB History    Gravida  4   Para  4   Term  4   Preterm  0   AB  0   Living  4     SAB  0   TAB  0   Ectopic  0   Multiple  0   Live Births  4            Home Medications     Prior to Admission medications   Medication Sig Start Date End Date Taking? Authorizing Provider  acetaminophen (TYLENOL) 325 MG tablet Take 325 mg by mouth every 6 (six) hours as needed for headache.    [provider]  amoxicillin (AMOXIL) 500 MG capsule Take 1 capsule (500 mg total) by mouth 3 (three) times daily. 03/29/17   Veryl Speak, MD  benzonatate (TESSALON) 100 MG capsule Take 1 capsule (100 mg total) by mouth every 8 (eight) hours for 7 days. 08/17/17 08/24/17  Janeece Fitting, PA-C  erythromycin ophthalmic ointment Place a 1/2 inch ribbon of ointment into the lower eyelid 4 times daily for 5 days 05/10/17   Hedges, Dellis Filbert, PA-C  fluticasone [facility]) 50 MCG/ACT nasal spray Place 1 spray into both nostrils daily. 08/17/17   Janeece Fitting, PA-C  ibuprofen (ADVIL,MOTRIN) 600 MG tablet Take 1 tablet (600 mg total) every 6 (six) hours by mouth. 11/26/16   Moss, [employer], DO  Prenatal Vit-Fe Fumarate-FA (PRENATAL MULTIVITAMIN) TABS tablet Take 1 tablet by mouth daily at 12 noon.    [provider]    Family History Family History  Problem Relation Age of Onset  . Hypertension Mother   .  Fibromyalgia Mother   . Cirrhosis Father   . Kidney disease Father   . Asthma Brother   . Birth defects Son        hirschprungs    Social History Social History   Tobacco Use  . Smoking status: Current Every Day Smoker    Packs/day: 0.50    Years: 14.00    Pack years: 7.00    Types: Cigarettes  . Smokeless tobacco: Never Used  Substance Use Topics  . Alcohol use: Yes    Comment: Not since June 2014  . Drug use: Yes    Types: Cocaine, Marijuana    Comment: Patient denies any recent use of Marijuna and cocaine as of 07/02/12    LAST SMOKED  MARIJUANA-   LAST WEEK LAST USED COCAINE-    2015     Allergies   Tramadol   Review of Systems Review of Systems  Constitutional: Negative for chills and fever.  HENT: Positive for rhinorrhea and sore throat. Negative for ear pain.    Eyes: Negative for pain and visual disturbance.  Respiratory: Negative for cough and shortness of breath.   Cardiovascular: Negative for chest pain and palpitations.  Gastrointestinal: Negative for abdominal pain and vomiting.  Genitourinary: Negative for dysuria and hematuria.  Musculoskeletal: Negative for arthralgias and back pain.  Skin: Negative for color change and rash.  Neurological: Negative for seizures and syncope.  All other systems reviewed and are negative.    Physical Exam Updated Vital Signs BP 131/82 (BP Location: Right Arm)   Pulse 80   Temp 98.8 F (37.1 C) (Oral)   Resp 18   Ht 5\' 7"  (1.702 m)   Wt 95.3 kg (210 lb)   SpO2 98%   BMI 32.89 kg/m   Physical Exam  Constitutional: She is oriented to person, place, and time. She appears well-developed and well-nourished. No distress.  HENT:  Head: Normocephalic and atraumatic.  Nose: Rhinorrhea present. No mucosal edema, nasal deformity, septal deviation or nasal septal hematoma. No epistaxis. Right sinus exhibits maxillary sinus tenderness. Left sinus exhibits maxillary sinus tenderness.  Mouth/Throat: Uvula is midline, oropharynx is clear and moist and mucous membranes are normal. No trismus in the jaw. No uvula swelling or lacerations. No oropharyngeal exudate.  Nostrils appear boggy with clear rhinorrhea draining on exam.   Eyes: Pupils are equal, round, and reactive to light.  Neck: Normal range of motion.  Cardiovascular: Regular rhythm and normal heart sounds.  Pulmonary/Chest: Effort normal and breath sounds normal. No respiratory distress. She has no decreased breath sounds. She has no wheezes. She has no rhonchi. She has no rales.  Abdominal: Soft. Bowel sounds are normal. She exhibits no distension. There is no tenderness.  Musculoskeletal: She exhibits no tenderness or deformity.       Right lower leg: She exhibits no edema.       Left lower leg: She exhibits no edema.  Neurological: She is alert and  oriented to person, place, and time.  Skin: Skin is warm and dry. Capillary refill takes less than 2 seconds.  Psychiatric: She has a normal mood and affect.  Nursing note and vitals reviewed.    ED Treatments / Results  Labs (all labs ordered are listed, but only abnormal results are displayed) Labs Reviewed  GROUP A STREP BY PCR    EKG None  Radiology No results found.  Procedures Procedures (including critical care time)  Medications Ordered in ED Medications - No data to display  Initial Impression / Assessment and Plan / ED Course  I have reviewed the triage vital signs and the nursing notes.  Pertinent labs & imaging results that were available during my care of the patient were reviewed by me and considered in my medical decision making (see chart for details).     There is no wheezing on exam, oropharynx is clear, dry and erythematous from cough. There is no facial swelling, neck look symmetrical patients is able to tolerate solids and liquids.Patient has children who are currently sick at home with ear infections. Symptoms have been present for 2 days. I have advised patient that we will symptomatic treat at this time.If her symptoms worsen she can return to the ED.   Final Clinical Impressions(s) / ED Diagnoses   Final diagnoses:  Viral URI with cough    ED Discharge Orders        Ordered    benzonatate (TESSALON) 100 MG capsule  Every 8 hours     08/17/17 1221    fluticasone (FLONASE) 50 MCG/ACT nasal spray  Daily     08/17/17 1221       Janeece Fitting, PA-C 08/17/17 1239    Tegeler, Gwenyth Allegra, MD 08/17/17 1640

## 2017-08-17 NOTE — ED Triage Notes (Signed)
Pt. Stated, my children have been sick and sat  I started feeling bad with sore throat and congestion.

## 2017-08-17 NOTE — Discharge Instructions (Addendum)
Please return to the ED if your symptoms worsen, or you experience any fever, chest pain or shortness of breath.

## 2017-08-19 ENCOUNTER — Emergency Department (HOSPITAL_COMMUNITY): Payer: Medicaid Other

## 2017-08-19 ENCOUNTER — Other Ambulatory Visit: Payer: Self-pay

## 2017-08-19 ENCOUNTER — Emergency Department (HOSPITAL_COMMUNITY)
Admission: EM | Admit: 2017-08-19 | Discharge: 2017-08-20 | Disposition: A | Discharge status: Home / Self Care | Payer: Medicaid Other | Attending: Emergency Medicine | Admitting: Emergency Medicine

## 2017-08-19 ENCOUNTER — Encounter (HOSPITAL_COMMUNITY): Payer: Self-pay | Admitting: Emergency Medicine

## 2017-08-19 DIAGNOSIS — Z79899 Other long term (current) drug therapy: Secondary | ICD-10-CM | POA: Insufficient documentation

## 2017-08-19 DIAGNOSIS — J029 Acute pharyngitis, unspecified: Secondary | ICD-10-CM | POA: Diagnosis present

## 2017-08-19 DIAGNOSIS — J069 Acute upper respiratory infection, unspecified: Secondary | ICD-10-CM

## 2017-08-19 DIAGNOSIS — B9789 Other viral agents as the cause of diseases classified elsewhere: Secondary | ICD-10-CM

## 2017-08-19 DIAGNOSIS — F17210 Nicotine dependence, cigarettes, uncomplicated: Secondary | ICD-10-CM | POA: Diagnosis not present

## 2017-08-19 NOTE — ED Triage Notes (Signed)
Pt has had sore throat and was seen here 2 days ago for same. NEgative for strep. Pain continues and has worsened. Pt states it is painful to swallow.

## 2017-08-19 NOTE — ED Provider Notes (Signed)
[facility] EMERGENCY DEPARTMENT Provider Note   CSN: [id number] Arrival date & time: 08/19/17  2129     History   Chief Complaint Chief Complaint  Patient presents with  . Sore Throat     HPI Dawn Brock is a 32 y.o. female with a hx of tobacco abuse and polysubstance abuse who returns to the ED with complaints of continued sore throat and URI sxs x 4 days. Patient reports congestion, bilateral ear pressure, sore throat, and productive cough with yellow colored sputum. Sxs have been constant, progressively worsening. She is most concerned about her throat which is a 10/10 in severity of pain, worse with swallowing, but she is able to swallow. She has tried hot tea and the tessalon and flonase prescribed at her prior visit without significant relief. Denies fever, vomiting, dyspnea, chest pain, neck stiffness, ear drainage, drooling, or hot potato type voice. She does feel her voice is a bit raspy. She also reports children sick at home, diagnosed with AOM and viral illnesses. Patient denies chance of pregnancy.   HPI  Past Medical History:  Diagnosis Date  . Abnormal Pap smear   . Anemia   . Gastritis   . Hx of chlamydia infection   . Vaginal Pap smear, abnormal     Patient Active Problem List   Diagnosis Date Noted  . Post-dates pregnancy 11/23/2016  . Indication for care in labor or delivery 11/23/2016  . H/O LEEP 08/21/2016  . Limited prenatal care in second trimester 08/21/2016  . Headache 08/21/2016  . Mild tetrahydrocannabinol (THC) abuse 08/21/2016  . History of substance abuse 08/21/2016  . Cocaine abuse complicating pregnancy ([place]) 02/22/2013  . Alcohol dependence ([place]) 03/12/2012  . Cannabis dependence ([place]) 03/12/2012  . Executive function deficit 03/11/2012    Class: Acute  . Cigarette smoker 03/10/2012  . Duodenitis with nausea, vomiting, diarrhea and abdominal pain 03/10/2012    Past Surgical History:  Procedure Laterality Date   . HERNIA REPAIR     As an infant  . LEEP       OB History    Gravida  4   Para  4   Term  4   Preterm  0   AB  0   Living  4     SAB  0   TAB  0   Ectopic  0   Multiple  0   Live Births  4            Home Medications    Prior to Admission medications   Medication Sig Start Date End Date Taking? Authorizing Provider  acetaminophen (TYLENOL) 325 MG tablet Take 325 mg by mouth every 6 (six) hours as needed for headache.    [provider]  amoxicillin (AMOXIL) 500 MG capsule Take 1 capsule (500 mg total) by mouth 3 (three) times daily. 03/29/17   Veryl Speak, MD  benzonatate (TESSALON) 100 MG capsule Take 1 capsule (100 mg total) by mouth every 8 (eight) hours for 7 days. 08/17/17 08/24/17  Janeece Fitting, PA-C  erythromycin ophthalmic ointment Place a 1/2 inch ribbon of ointment into the lower eyelid 4 times daily for 5 days 05/10/17   Hedges, Dellis Filbert, PA-C  fluticasone [facility]) 50 MCG/ACT nasal spray Place 1 spray into both nostrils daily. 08/17/17   Janeece Fitting, PA-C  ibuprofen (ADVIL,MOTRIN) 600 MG tablet Take 1 tablet (600 mg total) every 6 (six) hours by mouth. 11/26/16   Dannielle Huh, DO  Prenatal Vit-Fe  Fumarate-FA (PRENATAL MULTIVITAMIN) TABS tablet Take 1 tablet by mouth daily at 12 noon.    [provider]    Family History Family History  Problem Relation Age of Onset  . Hypertension Mother   . Fibromyalgia Mother   . Cirrhosis Father   . Kidney disease Father   . Asthma Brother   . Birth defects Son        hirschprungs    Social History Social History   Tobacco Use  . Smoking status: Current Every Day Smoker    Packs/day: 0.50    Years: 14.00    Pack years: 7.00    Types: Cigarettes  . Smokeless tobacco: Never Used  Substance Use Topics  . Alcohol use: Yes    Comment: Not since June 2014  . Drug use: Yes    Types: Cocaine, Marijuana    Comment: Patient denies any recent use of Marijuna and cocaine as of 07/02/12    LAST  SMOKED  MARIJUANA-   LAST WEEK LAST USED COCAINE-    2015     Allergies   Tramadol   Review of Systems Review of Systems  Constitutional: Negative for chills and fever.  HENT: Positive for congestion, ear pain, sore throat, trouble swallowing (painful,but able) and voice change (raspy, not hot potato voice). Negative for dental problem, drooling and ear discharge.   Respiratory: Positive for cough. Negative for shortness of breath.   Cardiovascular: Negative for chest pain.  Gastrointestinal: Negative for abdominal pain and vomiting.    Physical Exam Updated Vital Signs BP (!) 159/98 (BP Location: Right Arm)   Pulse 86   Temp 99.1 F (37.3 C) (Oral)   Resp 15   Ht 5\' 7"  (1.702 m)   Wt 93.9 kg   SpO2 100%   BMI 32.42 kg/m   Physical Exam  Constitutional: She appears well-developed and well-nourished. No distress.  HENT:  Head: Normocephalic and atraumatic.  Right Ear: No drainage or swelling. Tympanic membrane is not perforated, not erythematous, not retracted and not bulging.  Left Ear: No drainage or swelling. Tympanic membrane is not perforated, not erythematous, not retracted and not bulging.  Nose: Mucosal edema present.  Mouth/Throat: Uvula is midline. Oropharyngeal exudate and posterior oropharyngeal erythema present.  Patient is tolerating her own secretions without difficulty.  No trismus.  No drooling.  No hot potato voice.  Submandibular compartment is soft.  Eyes: Pupils are equal, round, and reactive to light. Conjunctivae are normal. Right eye exhibits no discharge. Left eye exhibits no discharge.  Neck: Normal range of motion. Neck supple. No neck rigidity. No edema and no erythema present.  Cardiovascular: Normal rate and regular rhythm.  No murmur heard. Pulmonary/Chest: Breath sounds normal. No respiratory distress. She has no wheezes. She has no rales.  Abdominal: Soft. She exhibits no distension. There is no tenderness.  Lymphadenopathy:    She has  cervical adenopathy.  Neurological: She is alert.  Skin: Skin is warm and dry. No rash noted.  Psychiatric: She has a normal mood and affect. Her behavior is normal.  Nursing note and vitals reviewed.    ED Treatments / Results  Labs (all labs ordered are listed, but only abnormal results are displayed) Labs Reviewed  GROUP A STREP BY PCR   EKG None  Radiology Dg Chest 2 View  Result Date: 08/20/2017 CLINICAL DATA:  Acute onset of cough, shortness of breath, sore throat and body aches. EXAM: CHEST - 2 VIEW COMPARISON:  Chest radiograph performed 02/19/2014  FINDINGS: The lungs are well-aerated. Mild peribronchial thickening is noted. There is no evidence of focal opacification, pleural effusion or pneumothorax. The heart is normal in size; the mediastinal contour is within normal limits. No acute osseous abnormalities are seen. IMPRESSION: Mild peribronchial thickening noted; lungs otherwise clear. Electronically Signed   By: Garald Balding M.D.   On: 08/20/2017 00:17    Procedures Procedures (including critical care time)  Medications Ordered in ED Medications  dexamethasone (DECADRON) injection 10 mg (has no administration in time range)     Initial Impression / Assessment and Plan / ED Course  I have reviewed the triage vital signs and the nursing notes.  Pertinent labs & imaging results that were available during my care of the patient were reviewed by me and considered in my medical decision making (see chart for details).    Patient presents with URI type symptoms.  Patient is nontoxic appearing, in no apparent distress, vitals are WNL with the exception of elevated BP, doubt HTN emergency, patient aware of need for recheck. Patient is afebrile in the ED, lungs are CTA, CXR negative for infiltrate, doubt pneumonia. There is no wheezing or signs of respiratory distress. Sxs onset < 7 days, afebrile, no sinus tenderness, doubt acute bacterial sinusitis. Centor score 2, strep  test repeated to ensure adequate sample obtained, remains negative today. No evidence of RPA/PTA on exam at this time- no trismus, no drooling, no hot potato voice, tolerating secretions, uvula midline, neck with full AROM. No evidence of AOM on exam. No meningeal signs. No history components or rashes to raise concern for tic borne illness. Suspect viral etiology at this time, supportive decadron in the ED, continue prior prescribed flonase/tessalon, additional prescription for naproxen provided. I discussed results, treatment plan, need for PCP follow-up, and return precautions with the patient. Provided opportunity for questions, patient confirmed understanding and is in agreement with plan.    Final Clinical Impressions(s) / ED Diagnoses   Final diagnoses:  Viral URI with cough    ED Discharge Orders         Ordered    naproxen (NAPROSYN) 500 MG tablet  2 times daily     08/20/17 [address] [place] R, PA-C 08/20/17 0120    Fredia Sorrow, MD 08/21/17 [phone]

## 2017-08-20 LAB — GROUP A STREP BY PCR: Group A Strep by PCR: NOT DETECTED

## 2017-08-20 MED ORDER — DEXAMETHASONE SODIUM PHOSPHATE 10 MG/ML IJ SOLN
10.0000 mg | Freq: Once | INTRAMUSCULAR | Status: AC
  Administered 2017-08-20: 10 mg via INTRAMUSCULAR
  Filled 2017-08-20: qty 1

## 2017-08-20 MED ORDER — NAPROXEN 500 MG PO TABS: 500.0000 mg | ORAL_TABLET | Freq: Two times a day (BID) | ORAL | 0 refills | Status: DC

## 2017-08-20 NOTE — Discharge Instructions (Addendum)
You were seen in the emergency department today for continued sore throat.  Your repeat strep test is negative.  Your chest x-ray did not show signs of pneumonia.  At this time suspect this is a viral illness.  You were given decadron in the ER which is a steroid to help with pain and inflammation.  We would like you to take the Flonase and Tessalon was prescribed at your last visit.  We have also giving a prescription for naproxen. - Naproxen is a nonsteroidal anti-inflammatory medication that will help with pain and swelling. Be sure to take this medication as prescribed with food, 1 pill every 12 hours,  It should be taken with food, as it can cause stomach upset, and more seriously, stomach bleeding. Do not take other nonsteroidal anti-inflammatory medications with this such as Advil, Motrin, Aleve, Mobic, Goodie Powder, or Motrin.    You make take Tylenol per over the counter dosing with these medications.   We have prescribed you new medication(s) today. Discuss the medications prescribed today with your pharmacist as they can have adverse effects and interactions with your other medicines including over the counter and prescribed medications. Seek medical evaluation if you start to experience new or abnormal symptoms after taking one of these medicines, seek care immediately if you start to experience difficulty breathing, feeling of your throat closing, facial swelling, or rash as these could be indications of a more serious allergic reaction.  Please follow-up with your primary care provider in 3 days for reevaluation of your symptoms.  Return to the ER for new or worsening symptoms including but not limited to inability to swallow, inability to open your mouth, trouble breathing, worsening pain, or any other concerns.  Additionally your blood pressure rechecked by her primary care provider as it was elevated in the emergency department today. Vitals:   08/19/17 2135  BP: (!) 159/98  Pulse: 86    Resp: 15  Temp: 99.1 F (37.3 C)  SpO2: 100%

## 2017-11-02 ENCOUNTER — Emergency Department (HOSPITAL_COMMUNITY)
Admission: EM | Admit: 2017-11-02 | Discharge: 2017-11-02 | Disposition: A | Discharge status: Home / Self Care | Payer: Medicaid Other | Attending: Emergency Medicine | Admitting: Emergency Medicine

## 2017-11-02 ENCOUNTER — Other Ambulatory Visit: Payer: Self-pay

## 2017-11-02 ENCOUNTER — Emergency Department (HOSPITAL_COMMUNITY): Payer: Medicaid Other

## 2017-11-02 ENCOUNTER — Encounter (HOSPITAL_COMMUNITY): Payer: Self-pay | Admitting: *Deleted

## 2017-11-02 DIAGNOSIS — R509 Fever, unspecified: Secondary | ICD-10-CM | POA: Diagnosis present

## 2017-11-02 DIAGNOSIS — J039 Acute tonsillitis, unspecified: Secondary | ICD-10-CM | POA: Insufficient documentation

## 2017-11-02 DIAGNOSIS — F17210 Nicotine dependence, cigarettes, uncomplicated: Secondary | ICD-10-CM | POA: Insufficient documentation

## 2017-11-02 LAB — CBC WITH DIFFERENTIAL/PLATELET
ABS IMMATURE GRANULOCYTES: 0.1 10*3/uL — AB (ref 0.00–0.07)
Basophils Absolute: 0 10*3/uL (ref 0.0–0.1)
Basophils Relative: 0 %
Eosinophils Absolute: 0 10*3/uL (ref 0.0–0.5)
Eosinophils Relative: 0 %
HCT: 43.4 % (ref 36.0–46.0)
HEMOGLOBIN: 13.7 g/dL (ref 12.0–15.0)
IMMATURE GRANULOCYTES: 1 %
Lymphocytes Relative: 10 %
Lymphs Abs: 1.8 10*3/uL (ref 0.7–4.0)
MCH: 30.5 pg (ref 26.0–34.0)
MCHC: 31.6 g/dL (ref 30.0–36.0)
MCV: 96.7 fL (ref 80.0–100.0)
Monocytes Absolute: 1.1 10*3/uL — ABNORMAL HIGH (ref 0.1–1.0)
Monocytes Relative: 6 %
Neutro Abs: 15.2 10*3/uL — ABNORMAL HIGH (ref 1.7–7.7)
Neutrophils Relative %: 83 %
Platelets: 230 10*3/uL (ref 150–400)
RBC: 4.49 MIL/uL (ref 3.87–5.11)
RDW: 12.9 % (ref 11.5–15.5)
WBC: 18.3 10*3/uL — ABNORMAL HIGH (ref 4.0–10.5)
nRBC: 0 % (ref 0.0–0.2)

## 2017-11-02 LAB — I-STAT BETA HCG BLOOD, ED (MC, WL, AP ONLY): I-stat hCG, quantitative: 12.1 m[IU]/mL — ABNORMAL HIGH (ref <5)

## 2017-11-02 LAB — BASIC METABOLIC PANEL
Anion gap: 8 (ref 5–15)
CO2: 25 mmol/L (ref 22–32)
CREATININE: 0.85 mg/dL (ref 0.44–1.00)
Calcium: 8.9 mg/dL (ref 8.9–10.3)
Chloride: 99 mmol/L (ref 98–111)
GFR calc Af Amer: >60 mL/min (ref >60)
Glucose, Bld: 101 mg/dL — ABNORMAL HIGH (ref 70–99)
Potassium: 3.4 mmol/L — ABNORMAL LOW (ref 3.5–5.1)
SODIUM: 132 mmol/L — AB (ref 135–145)

## 2017-11-02 LAB — GROUP A STREP BY PCR: GROUP A STREP BY PCR: NOT DETECTED

## 2017-11-02 MED ORDER — IOHEXOL 300 MG/ML  SOLN
75.0000 mL | Freq: Once | INTRAMUSCULAR | Status: AC | PRN
  Administered 2017-11-02: 75 mL via INTRAVENOUS

## 2017-11-02 MED ORDER — OXYCODONE HCL 5 MG PO TABS: 2.5000 mg | ORAL_TABLET | Freq: Four times a day (QID) | ORAL | 0 refills | Status: DC | PRN

## 2017-11-02 MED ORDER — DEXAMETHASONE SODIUM PHOSPHATE 10 MG/ML IJ SOLN
10.0000 mg | Freq: Once | INTRAMUSCULAR | Status: AC
  Administered 2017-11-02: 10 mg via INTRAVENOUS
  Filled 2017-11-02: qty 1

## 2017-11-02 MED ORDER — NAPROXEN 375 MG PO TABS: 375.0000 mg | ORAL_TABLET | Freq: Two times a day (BID) | ORAL | 0 refills | Status: DC

## 2017-11-02 MED ORDER — METHYLPREDNISOLONE 4 MG PO TBPK: ORAL_TABLET | ORAL | 0 refills | Status: DC

## 2017-11-02 MED ORDER — LIDOCAINE VISCOUS HCL 2 % MT SOLN
15.0000 mL | Freq: Once | OROMUCOSAL | Status: AC
  Administered 2017-11-02: 15 mL via OROMUCOSAL
  Filled 2017-11-02: qty 15

## 2017-11-02 MED ORDER — KETOROLAC TROMETHAMINE 30 MG/ML IJ SOLN
30.0000 mg | Freq: Once | INTRAMUSCULAR | Status: AC
  Administered 2017-11-02: 30 mg via INTRAVENOUS
  Filled 2017-11-02: qty 1

## 2017-11-02 MED ORDER — SODIUM CHLORIDE 0.9 % IV BOLUS
1000.0000 mL | Freq: Once | INTRAVENOUS | Status: AC
  Administered 2017-11-02: 1000 mL via INTRAVENOUS

## 2017-11-02 MED ORDER — HYDROCODONE-ACETAMINOPHEN 5-325 MG PO TABS
1.0000 | ORAL_TABLET | Freq: Once | ORAL | Status: AC
  Administered 2017-11-02: 1 via ORAL
  Filled 2017-11-02: qty 1

## 2017-11-02 NOTE — ED Notes (Signed)
Patient transported to CT 

## 2017-11-02 NOTE — ED Provider Notes (Signed)
Patient placed in Quick Look pathway, seen and evaluated   Chief Complaint: Sore throat, headache  HPI:  Dawn Brock is a 32 y.o. female reports she woke up this morning with severe sore throat, has had difficulty swallowing and reports pain throughout her neck.  Has had subjective fevers and chills at home.  No rhinorrhea or cough.  No chest pain or shortness of breath.  She has had a severe frontal headache for the past few days.  Noted to be febrile and tachycardic in triage.  She reports her 65-year-old son has had some coughing but no other known sick contacts.  ROS: + Fever, sore throat, neck pain, headache. -Cough, congestion, chest pain, shortness of breath, abdominal pain  Physical Exam:   Gen: No distress  Neuro: Awake and Alert  Skin: Warm    Focused Exam: Bilateral toes from the accident uvula midline, no stridor, significant cervical lymphadenopathy.  Tolerating secretions, some trismus.   Initiation of care has begun. The patient has been counseled on the process, plan, and necessity for staying for the completion/evaluation, and the remainder of the medical screening examination    Dawn Brock 11/02/17 2016    Mesner, Corene Cornea, MD 11/02/17 2259

## 2017-11-02 NOTE — Discharge Instructions (Addendum)
Contact a health care provider if: °Large, tender lumps develop in your neck. °A rash develops. °A green, yellow-brown, or bloody substance is coughed up. °You are unable to swallow liquids or food for 24 hours. °You notice that only one of the tonsils is swollen. °Get help right away if: °You develop any new symptoms such as vomiting, severe headache, stiff neck, chest pain, or trouble breathing or swallowing. °You have severe throat pain along with drooling or voice changes. °You have severe pain, unrelieved with recommended medications. °You are unable to fully open the mouth. °You develop redness, swelling, or severe pain anywhere in the neck. °You have a fever. °

## 2017-11-02 NOTE — ED Triage Notes (Signed)
PT reports a HA ,sore throat since this AM. Pt reports working ast night and woke up felling bad. Pt has fever on arrival to ED.

## 2017-11-02 NOTE — ED Provider Notes (Signed)
[facility] EMERGENCY DEPARTMENT Provider Note   CSN: [id number] Arrival date & time: 11/02/17  1757     History   Chief Complaint Chief Complaint  Patient presents with  . Fever  . Sore Throat    HPI Dawn Brock is a 32 y.o. female.  HPI  16 YOF presents today with sore throat.  Patient notes very minimal sore throat yesterday, worsening overnight with severe pain this morning.  Patient notes headache with sensitivity light and sound.  Patient notes painful swallowing.  Subjective fevers at home.  Patient denies cough.  She notes 69-year-old son is sick with infectious symptoms.   Past Medical History:  Diagnosis Date  . Abnormal Pap smear   . Anemia   . Gastritis   . Hx of chlamydia infection   . Vaginal Pap smear, abnormal     Patient Active Problem List   Diagnosis Date Noted  . Post-dates pregnancy 11/23/2016  . Indication for care in labor or delivery 11/23/2016  . H/O LEEP 08/21/2016  . Limited prenatal care in second trimester 08/21/2016  . Headache 08/21/2016  . Mild tetrahydrocannabinol (THC) abuse 08/21/2016  . History of substance abuse ([place]) 08/21/2016  . Cocaine abuse complicating pregnancy ([place]) 02/22/2013  . Alcohol dependence ([place]) 03/12/2012  . Cannabis dependence ([place]) 03/12/2012  . Executive function deficit 03/11/2012    Class: Acute  . Cigarette smoker 03/10/2012  . Duodenitis with nausea, vomiting, diarrhea and abdominal pain 03/10/2012    Past Surgical History:  Procedure Laterality Date  . HERNIA REPAIR     As an infant  . LEEP       OB History    Gravida  4   Para  4   Term  4   Preterm  0   AB  0   Living  4     SAB  0   TAB  0   Ectopic  0   Multiple  0   Live Births  4            Home Medications    Prior to Admission medications   Medication Sig Start Date End Date Taking? Authorizing Provider  amoxicillin (AMOXIL) 500 MG capsule Take 1 capsule (500 mg total) by mouth  3 (three) times daily. Patient not taking: Reported on 11/02/2017 03/29/17   Veryl Speak, MD  erythromycin ophthalmic ointment Place a 1/2 inch ribbon of ointment into the lower eyelid 4 times daily for 5 days Patient not taking: Reported on 11/02/2017 05/10/17   Isabellamarie Randa, Dellis Filbert, PA-C  fluticasone [facility]) 50 MCG/ACT nasal spray Place 1 spray into both nostrils daily. Patient not taking: Reported on 11/02/2017 08/17/17   Janeece Fitting, PA-C  ibuprofen (ADVIL,MOTRIN) 600 MG tablet Take 1 tablet (600 mg total) every 6 (six) hours by mouth. Patient not taking: Reported on 11/02/2017 11/26/16   Dannielle Huh, DO  naproxen (NAPROSYN) 500 MG tablet Take 1 tablet (500 mg total) by mouth 2 (two) times daily. Patient not taking: Reported on 11/02/2017 08/20/17   Petrucelli, Glynda Jaeger, PA-C    Family History Family History  Problem Relation Age of Onset  . Hypertension Mother   . Fibromyalgia Mother   . Cirrhosis Father   . Kidney disease Father   . Asthma Brother   . Birth defects Son        hirschprungs    Social History Social History   Tobacco Use  . Smoking status: Current Every Day Smoker  Packs/day: 0.50    Years: 14.00    Pack years: 7.00    Types: Cigarettes  . Smokeless tobacco: Never Used  Substance Use Topics  . Alcohol use: Yes    Comment: Not since June 2014  . Drug use: Yes    Types: Cocaine, Marijuana    Comment: Patient denies any recent use of Marijuna and cocaine as of 07/02/12    LAST SMOKED  MARIJUANA-   LAST WEEK LAST USED COCAINE-    2015     Allergies   Tramadol   Review of Systems Review of Systems  All other systems reviewed and are negative.    Physical Exam Updated Vital Signs BP 128/68   Pulse 100   Temp (!) 101.3 F (38.5 C) (Oral)   Resp 20   Ht 5\' 7"  (1.702 m)   Wt 90.7 kg   SpO2 99%   BMI 31.32 kg/m   Physical Exam  Constitutional: She is oriented to person, place, and time. She appears well-developed and well-nourished.  HENT:    Head: Normocephalic and atraumatic.  Bilateral tonsillar swelling with erythema, discharge noted throughout unable to visualize the posterior oropharynx, uvula midline rises with phonation, no pooling of secretions-under anterior cervical lymphadenopathy neck is supple  Eyes: Pupils are equal, round, and reactive to light. Conjunctivae are normal. Right eye exhibits no discharge. Left eye exhibits no discharge. No scleral icterus.  Neck: Normal range of motion. No JVD present. No tracheal deviation present.  Pulmonary/Chest: Effort normal. No stridor.  Neurological: She is alert and oriented to person, place, and time. Coordination normal.  Psychiatric: She has a normal mood and affect. Her behavior is normal. Judgment and thought content normal.  Nursing note and vitals reviewed.   ED Treatments / Results  Labs (all labs ordered are listed, but only abnormal results are displayed) Labs Reviewed  BASIC METABOLIC PANEL - Abnormal; Notable for the following components:      Result Value   Sodium 132 (*)    Potassium 3.4 (*)    Glucose, Bld 101 (*)    BUN <5 (*)    All other components within normal limits  CBC WITH DIFFERENTIAL/PLATELET - Abnormal; Notable for the following components:   WBC 18.3 (*)    Neutro Abs 15.2 (*)    Monocytes Absolute 1.1 (*)    Abs Immature Granulocytes 0.10 (*)    All other components within normal limits  I-STAT BETA HCG BLOOD, ED ([facility], [facility], [facility] ONLY) - Abnormal; Notable for the following components:   I-stat hCG, quantitative 12.1 (*)    All other components within normal limits  GROUP A STREP BY PCR  POC URINE PREG, ED    EKG None  Radiology No results found.  Procedures Procedures (including critical care time)  Medications Ordered in ED Medications  sodium chloride 0.9 % bolus 1,000 mL (1,000 mLs Intravenous New Bag/Given 11/02/17 2047)  ketorolac (TORADOL) 30 MG/ML injection 30 mg (30 mg Intravenous Given 11/02/17 2046)   HYDROcodone-acetaminophen (NORCO/VICODIN) 5-325 MG per tablet 1 tablet (1 tablet Oral Given 11/02/17 2045)  lidocaine (XYLOCAINE) 2 % viscous mouth solution 15 mL (15 mLs Mouth/Throat Given 11/02/17 2046)  dexamethasone (DECADRON) injection 10 mg (10 mg Intravenous Given 11/02/17 2046)     Initial Impression / Assessment and Plan / ED Course  I have reviewed the triage vital signs and the nursing notes.  Pertinent labs & imaging results that were available during my care of the patient were reviewed  by me and considered in my medical decision making (see chart for details).     Labs: Group A Strep BMP, CBC  Imaging: CT neck soft tissue  Consults:  Therapeutics: Toradol, lidocaine viscous, Norco, Decadron, normal saline  Discharge Meds:   Assessment/Plan:  38 YOF presents today with pharyngitis. Unable to visualize posterior oropharynx, patient. Patient reporting severe pain. Ct ordered to rule out significant deep space infection. Patient care assigned to oncoming provider pending CT and disposition.    Final Clinical Impressions(s) / ED Diagnoses   Final diagnoses:  Pharyngitis, unspecified etiology    ED Discharge Orders    None       Francee Gentile 11/02/17 2139    Jola Schmidt, MD 11/02/17 2315

## 2017-11-02 NOTE — ED Provider Notes (Signed)
9:42 PM BP 128/68   Pulse 100   Temp (!) 101.3 F (38.5 C) (Oral)   Resp 20   Ht 5\' 7"  (1.702 m)   Wt 90.7 kg   SpO2 99%   BMI 31.32 kg/m   Patient given in sign out from PA Dawn Brock.  Patient with significant tonsillitis awaiting CT scan.  She is been tolerating her own secretions. Medications  ketorolac (TORADOL) 30 MG/ML injection 30 mg (30 mg Intravenous Given 11/02/17 2046)  sodium chloride 0.9 % bolus 1,000 mL (0 mLs Intravenous Stopped 11/02/17 2337)  HYDROcodone-acetaminophen (NORCO/VICODIN) 5-325 MG per tablet 1 tablet (1 tablet Oral Given 11/02/17 2045)  lidocaine (XYLOCAINE) 2 % viscous mouth solution 15 mL (15 mLs Mouth/Throat Given 11/02/17 2046)  dexamethasone (DECADRON) injection 10 mg (10 mg Intravenous Given 11/02/17 2046)  iohexol (OMNIPAQUE) 300 MG/ML solution 75 mL (75 mLs Intravenous Contrast Given 11/02/17 2207)  \   11:20 PM BP 99/83   Pulse 100   Temp 98.2 F (36.8 C) (Oral)   Resp 18   Ht 5\' 7"  (1.702 m)   Wt 90.7 kg   SpO2 98%   BMI 31.32 kg/m    Patient scan reviewed by me.  Shows tonsillitis without any specific fluid collection.  Patient will be discharged with symptomatic treatment.  Her rapid strep test was negative but culture is pending.  She is tolerating her own secretions. Patient's symptoms have improved with treatment although she still feels very badly.  Discussed return precautions and outpatient follow-up.  She appears appropriate for discharge at this time.   Results for orders placed or performed during the hospital encounter of 11/02/17  Group A Strep by PCR  Result Value Ref Range   Group A Strep by PCR NOT DETECTED NOT DETECTED  Basic metabolic panel  Result Value Ref Range   Sodium 132 (L) 135 - 145 mmol/L   Potassium 3.4 (L) 3.5 - 5.1 mmol/L   Chloride 99 98 - 111 mmol/L   CO2 25 22 - 32 mmol/L   Glucose, Bld 101 (H) 70 - 99 mg/dL   BUN <5 (L) 6 - 20 mg/dL   Creatinine, Ser 0.85 0.44 - 1.00 mg/dL   Calcium 8.9 8.9 -  10.3 mg/dL   GFR calc non Af Amer >60 >60 mL/min   GFR calc Af Amer >60 >60 mL/min   Anion gap 8 5 - 15  CBC with Differential  Result Value Ref Range   WBC 18.3 (H) 4.0 - 10.5 K/uL   RBC 4.49 3.87 - 5.11 MIL/uL   Hemoglobin 13.7 12.0 - 15.0 g/dL   HCT 43.4 36.0 - 46.0 %   MCV 96.7 80.0 - 100.0 fL   MCH 30.5 26.0 - 34.0 pg   MCHC 31.6 30.0 - 36.0 g/dL   RDW 12.9 11.5 - 15.5 %   Platelets 230 150 - 400 K/uL   nRBC 0.0 0.0 - 0.2 %   Neutrophils Relative % 83 %   Neutro Abs 15.2 (H) 1.7 - 7.7 K/uL   Lymphocytes Relative 10 %   Lymphs Abs 1.8 0.7 - 4.0 K/uL   Monocytes Relative 6 %   Monocytes Absolute 1.1 (H) 0.1 - 1.0 K/uL   Eosinophils Relative 0 %   Eosinophils Absolute 0.0 0.0 - 0.5 K/uL   Basophils Relative 0 %   Basophils Absolute 0.0 0.0 - 0.1 K/uL   Immature Granulocytes 1 %   Abs Immature Granulocytes 0.10 (H) 0.00 - 0.07 K/uL  I-Stat  beta hCG blood, ED  Result Value Ref Range   I-stat hCG, quantitative 12.1 (H) <5 mIU/mL   Comment 3              Margarita Mail, PA-C 11/04/17 1709    Lajean Saver, MD 11/08/17 1719

## 2018-02-03 ENCOUNTER — Other Ambulatory Visit: Payer: Self-pay

## 2018-02-03 ENCOUNTER — Emergency Department (HOSPITAL_COMMUNITY)
Admission: EM | Admit: 2018-02-03 | Discharge: 2018-02-04 | Discharge status: Against Medical Advice | Payer: Medicaid Other | Attending: Emergency Medicine | Admitting: Emergency Medicine

## 2018-02-03 ENCOUNTER — Encounter (HOSPITAL_COMMUNITY): Payer: Self-pay | Admitting: *Deleted

## 2018-02-03 DIAGNOSIS — Z5321 Procedure and treatment not carried out due to patient leaving prior to being seen by health care provider: Secondary | ICD-10-CM | POA: Diagnosis not present

## 2018-02-03 DIAGNOSIS — L02412 Cutaneous abscess of left axilla: Secondary | ICD-10-CM | POA: Diagnosis present

## 2018-02-03 NOTE — ED Triage Notes (Signed)
Pt reports left axilla abscess since Tuesday, has had abscesses here previously. Using warm compresses without relief. No drainage or fevers.

## 2018-02-04 NOTE — ED Notes (Signed)
Patient states she will come back when less busy; leaving [place]

## 2018-02-07 ENCOUNTER — Encounter (HOSPITAL_COMMUNITY): Payer: Self-pay

## 2018-02-07 ENCOUNTER — Other Ambulatory Visit: Payer: Self-pay

## 2018-02-07 ENCOUNTER — Emergency Department (HOSPITAL_COMMUNITY)
Admission: EM | Admit: 2018-02-07 | Discharge: 2018-02-08 | Disposition: A | Discharge status: Home / Self Care | Payer: Medicaid Other | Attending: Emergency Medicine | Admitting: Emergency Medicine

## 2018-02-07 DIAGNOSIS — Z79899 Other long term (current) drug therapy: Secondary | ICD-10-CM | POA: Insufficient documentation

## 2018-02-07 DIAGNOSIS — F17210 Nicotine dependence, cigarettes, uncomplicated: Secondary | ICD-10-CM | POA: Insufficient documentation

## 2018-02-07 DIAGNOSIS — L02412 Cutaneous abscess of left axilla: Secondary | ICD-10-CM | POA: Insufficient documentation

## 2018-02-07 NOTE — ED Triage Notes (Signed)
Pt here with left axilla abscess.  Seen here previously but pain remains and hard to move arm due to pain.

## 2018-02-08 MED ORDER — OXYCODONE-ACETAMINOPHEN 5-325 MG PO TABS
1.0000 | ORAL_TABLET | Freq: Once | ORAL | Status: AC
  Administered 2018-02-08: 1 via ORAL
  Filled 2018-02-08: qty 1

## 2018-02-08 MED ORDER — CEPHALEXIN 500 MG PO CAPS: 500.0000 mg | ORAL_CAPSULE | Freq: Four times a day (QID) | ORAL | 0 refills | Status: DC

## 2018-02-08 MED ORDER — LIDOCAINE-EPINEPHRINE (PF) 2 %-1:200000 IJ SOLN
10.0000 mL | Freq: Once | INTRAMUSCULAR | Status: AC
  Administered 2018-02-08: 10 mL
  Filled 2018-02-08: qty 20

## 2018-02-08 NOTE — ED Provider Notes (Signed)
[facility] EMERGENCY DEPARTMENT Provider Note   CSN: [id number] Arrival date & time: 02/07/18  2255     History   Chief Complaint Chief Complaint  Patient presents with  . Abscess    left under arm    HPI Dawn Brock is a 33 y.o. female who presents to ED for evaluation of 1 week history of abscess under left axilla.  States that she has had this area incised and drained in the past with improvement in her symptoms.  She denies any fevers, trauma to the area.  She has not been taking any medications to help with symptoms.  Pain is worse with movement and palpation.  HPI  Past Medical History:  Diagnosis Date  . Abnormal Pap smear   . Anemia   . Gastritis   . Hx of chlamydia infection   . Vaginal Pap smear, abnormal     Patient Active Problem List   Diagnosis Date Noted  . Post-dates pregnancy 11/23/2016  . Indication for care in labor or delivery 11/23/2016  . H/O LEEP 08/21/2016  . Limited prenatal care in second trimester 08/21/2016  . Headache 08/21/2016  . Mild tetrahydrocannabinol (THC) abuse 08/21/2016  . History of substance abuse ([place]) 08/21/2016  . Cocaine abuse complicating pregnancy ([place]) 02/22/2013  . Alcohol dependence ([place]) 03/12/2012  . Cannabis dependence ([place]) 03/12/2012  . Executive function deficit 03/11/2012    Class: Acute  . Cigarette smoker 03/10/2012  . Duodenitis with nausea, vomiting, diarrhea and abdominal pain 03/10/2012    Past Surgical History:  Procedure Laterality Date  . HERNIA REPAIR     As an infant  . LEEP       OB History    Gravida  4   Para  4   Term  4   Preterm  0   AB  0   Living  4     SAB  0   TAB  0   Ectopic  0   Multiple  0   Live Births  4            Home Medications    Prior to Admission medications   Medication Sig Start Date End Date Taking? Authorizing Provider  amoxicillin (AMOXIL) 500 MG capsule Take 1 capsule (500 mg total) by mouth 3 (three)  times daily. Patient not taking: Reported on 11/02/2017 03/29/17   Veryl Speak, MD  cephALEXin (KEFLEX) 500 MG capsule Take 1 capsule (500 mg total) by mouth 4 (four) times daily. 02/08/18   Delia Heady, PA-C  erythromycin ophthalmic ointment Place a 1/2 inch ribbon of ointment into the lower eyelid 4 times daily for 5 days Patient not taking: Reported on 11/02/2017 05/10/17   Hedges, Dellis Filbert, PA-C  fluticasone (FLONASE) 50 MCG/ACT nasal spray Place 1 spray into both nostrils daily. Patient not taking: Reported on 11/02/2017 08/17/17   Janeece Fitting, PA-C  ibuprofen (ADVIL,MOTRIN) 600 MG tablet Take 1 tablet (600 mg total) every 6 (six) hours by mouth. Patient not taking: Reported on 11/02/2017 11/26/16   Dannielle Huh, DO  methylPREDNISolone (MEDROL DOSEPAK) 4 MG TBPK tablet Use as directed 11/02/17   Margarita Mail, PA-C  naproxen (NAPROSYN) 375 MG tablet Take 1 tablet (375 mg total) by mouth 2 (two) times daily. 11/02/17   Harris, Vernie Shanks, PA-C  oxyCODONE (ROXICODONE) 5 MG immediate release tablet Take 0.5-1 tablets (2.5-5 mg total) by mouth every 6 (six) hours as needed for severe pain. 11/02/17   Margarita Mail, PA-C  Family History Family History  Problem Relation Age of Onset  . Hypertension Mother   . Fibromyalgia Mother   . Cirrhosis Father   . Kidney disease Father   . Asthma Brother   . Birth defects Son        hirschprungs    Social History Social History   Tobacco Use  . Smoking status: Current Every Day Smoker    Packs/day: 0.50    Years: 14.00    Pack years: 7.00    Types: Cigarettes  . Smokeless tobacco: Never Used  Substance Use Topics  . Alcohol use: Yes    Comment: Not since June 2014  . Drug use: Yes    Types: Cocaine, Marijuana    Comment: Patient denies any recent use of Marijuna and cocaine as of 07/02/12    LAST SMOKED  MARIJUANA-   LAST WEEK LAST USED COCAINE-    2015     Allergies   Tramadol   Review of Systems Review of Systems    Constitutional: Negative for chills and fever.  Skin: Positive for wound.  Neurological: Negative for weakness and numbness.     Physical Exam Updated Vital Signs BP 124/76 (BP Location: Right Arm)   Pulse 72   Temp 98.6 F (37 C) (Oral)   Resp 16   Ht 5\' 7"  (1.702 m)   Wt 94.3 kg   SpO2 100%   BMI 32.58 kg/m   Physical Exam Vitals signs and nursing note reviewed.  Constitutional:      General: She is not in acute distress.    Appearance: She is well-developed. She is not diaphoretic.  HENT:     Head: Normocephalic and atraumatic.  Eyes:     General: No scleral icterus.    Conjunctiva/sclera: Conjunctivae normal.  Neck:     Musculoskeletal: Normal range of motion.  Pulmonary:     Effort: Pulmonary effort is normal. No respiratory distress.  Skin:    Findings: No rash.     Comments: 2 x 3 centimeter area of induration with central fluctuance noted of the left axilla.  Mild surrounding erythema.  Neurological:     Mental Status: She is alert.      ED Treatments / Results  Labs (all labs ordered are listed, but only abnormal results are displayed) Labs Reviewed - No data to display  EKG None  Radiology No results found.  Procedures .Marland KitchenIncision and Drainage Date/Time: 02/08/2018 9:12 AM Performed by: Delia Heady, PA-C Authorized by: Delia Heady, PA-C   Consent:    Consent obtained:  Verbal   Consent given by:  Patient   Risks discussed:  Bleeding, damage to other organs, incomplete drainage, infection and pain Location:    Type:  Abscess   Location:  Upper extremity   Upper extremity location:  Arm   Arm location:  L upper arm Pre-procedure details:    Skin preparation:  Betadine Anesthesia (see MAR for exact dosages):    Anesthesia method:  Local infiltration   Local anesthetic:  Lidocaine 2% WITH epi Procedure details:    Needle aspiration: no     Incision types:  Stab incision   Incision depth:  Dermal   Scalpel blade:  11   Wound  management:  Probed and deloculated and irrigated with saline   Drainage:  Purulent and bloody   Drainage amount:  Moderate   Packing materials:  1/4 in gauze Post-procedure details:    Patient tolerance of procedure:  Tolerated well, no immediate  complications   (including critical care time)  Medications Ordered in ED Medications  lidocaine-EPINEPHrine (XYLOCAINE W/EPI) 2 %-1:200000 (PF) injection 10 mL (10 mLs Infiltration Given by Other 02/08/18 0746)  oxyCODONE-acetaminophen (PERCOCET/ROXICET) 5-325 MG per tablet 1 tablet (1 tablet Oral Given 02/08/18 0745)     Initial Impression / Assessment and Plan / ED Course  I have reviewed the triage vital signs and the nursing notes.  Pertinent labs & imaging results that were available during my care of the patient were reviewed by me and considered in my medical decision making (see chart for details).      Patient with skin abscess. Incision and drainage performed in the ED today with purulent drainage noted.  Packing was placed due to size.  No signs of systemic illness present. Advised patient to return for wound recheck in 2 days, sooner if signs of infection or increased bleeding/drainage noted. Supportive care and return precautions discussed.  Pt sent home with Keflex. The patient appears reasonably screened and/or stabilized for discharge. Strict return precautions given.  Patient is hemodynamically stable, in NAD, and able to ambulate in the ED. Evaluation does not show pathology that would require ongoing emergent intervention or inpatient treatment. I explained the diagnosis to the patient. Pain has been managed and has no complaints prior to discharge. Patient is comfortable with above plan and is stable for discharge at this time. All questions were answered prior to disposition. Strict return precautions for returning to the ED were discussed. Encouraged follow up with PCP.    Portions of this note were generated with Geographical information systems officer. Dictation errors may occur despite best attempts at proofreading.   Final Clinical Impressions(s) / ED Diagnoses   Final diagnoses:  Abscess of left axilla    ED Discharge Orders         Ordered    cephALEXin (KEFLEX) 500 MG capsule  4 times daily     02/08/18 0910           Delia Heady, PA-C 02/08/18 0913    Dorie Rank, MD 02/08/18 1739

## 2018-02-08 NOTE — Discharge Instructions (Signed)
Return to ED for worsening symptoms, increased swelling, fevers, redness or streaking.

## 2018-03-30 ENCOUNTER — Ambulatory Visit (HOSPITAL_COMMUNITY)
Admission: EM | Admit: 2018-03-30 | Discharge: 2018-03-30 | Disposition: A | Discharge status: Home / Self Care | Payer: Medicaid Other | Attending: Family Medicine | Admitting: Family Medicine

## 2018-03-30 ENCOUNTER — Other Ambulatory Visit: Payer: Self-pay

## 2018-03-30 ENCOUNTER — Encounter (HOSPITAL_COMMUNITY): Payer: Self-pay | Admitting: Emergency Medicine

## 2018-03-30 DIAGNOSIS — R05 Cough: Secondary | ICD-10-CM

## 2018-03-30 DIAGNOSIS — R062 Wheezing: Secondary | ICD-10-CM

## 2018-03-30 DIAGNOSIS — J069 Acute upper respiratory infection, unspecified: Secondary | ICD-10-CM

## 2018-03-30 DIAGNOSIS — R059 Cough, unspecified: Secondary | ICD-10-CM

## 2018-03-30 MED ORDER — PREDNISONE 10 MG (21) PO TBPK: ORAL_TABLET | Freq: Every day | ORAL | 0 refills | Status: DC

## 2018-03-30 MED ORDER — BENZONATATE 100 MG PO CAPS: 100.0000 mg | ORAL_CAPSULE | Freq: Three times a day (TID) | ORAL | 0 refills | Status: DC

## 2018-03-30 NOTE — ED Provider Notes (Signed)
[facility]   [id number] 03/30/18 Arrival Time: 9323  ASSESSMENT & PLAN:  1. Viral upper respiratory tract infection   2. Cough   3. Wheezing    See AVS for discharge instructions. No concern for COVID-19. Discussed.  Meds ordered this encounter  Medications  . benzonatate (TESSALON) 100 MG capsule    Sig: Take 1 capsule (100 mg total) by mouth every 8 (eight) hours.    Dispense:  21 capsule    Refill:  0  . predniSONE (STERAPRED UNI-PAK 21 TAB) 10 MG (21) TBPK tablet    Sig: Take by mouth daily. Take as directed.    Dispense:  21 tablet    Refill:  0   Work note provided. Discussed typical duration of symptoms. OTC symptom care as needed. Ensure adequate fluid intake and rest. May f/u with PCP or here as needed.  Reviewed expectations re: course of current medical issues. Questions answered. Outlined signs and symptoms indicating need for more acute intervention. Patient verbalized understanding. After Visit Summary given.   SUBJECTIVE: History from: patient.  Dawn Brock is a 33 y.o. female who presents with complaint of nasal congestion, post-nasal drainage, and a persistent dry cough; with a mild sore throat. Onset abrupt, about 3 d ago; with mild fatigue and without body aches. SOB: none. Wheezing: mild to moderate when present, esp with coughing. Fever: no. Overall normal PO intake without n/v. Known sick contacts: no. No specific or significant aggravating or alleviating factors reported. OTC treatment: various without much relief. Did receive flu shot this year.  Social History   Tobacco Use  Smoking Status Current Every Day Smoker  . Packs/day: 0.50  . Years: 14.00  . Pack years: 7.00  . Types: Cigarettes  Smokeless Tobacco Never Used    ROS: As per HPI. All other systems negative.    OBJECTIVE:  Vitals:   03/30/18 1329  BP: (!) 124/54  Pulse: 95  Resp: 16  Temp: 98.3 F (36.8 C)  SpO2: 100%     General appearance:  alert; appears fatigued HEENT: nasal congestion; clear runny nose; throat irritation secondary to post-nasal drainage Neck: supple without LAD CV: RRR Lungs: unlabored respirations, symmetrical air entry with mild expiratory wheezing; cough: moderate Abd: soft Ext: no LE edema Skin: warm and dry Psychological: alert and cooperative; normal mood and affect   Allergies  Allergen Reactions  . Tramadol Nausea And Vomiting    Past Medical History:  Diagnosis Date  . Abnormal Pap smear   . Anemia   . Gastritis   . Hx of chlamydia infection   . Vaginal Pap smear, abnormal    Family History  Problem Relation Age of Onset  . Hypertension Mother   . Fibromyalgia Mother   . Cirrhosis Father   . Kidney disease Father   . Asthma Brother   . Birth defects Son        hirschprungs   Social History   Socioeconomic History  . Marital status: Single    Spouse name: Not on file  . Number of children: Not on file  . Years of education: Not on file  . Highest education level: Not on file  Occupational History  . Not on file  Social Needs  . Financial resource strain: Not on file  . Food insecurity:    Worry: Not on file    Inability: Not on file  . Transportation needs:    Medical: Not on file    Non-medical: Not on  file  Tobacco Use  . Smoking status: Current Every Day Smoker    Packs/day: 0.50    Years: 14.00    Pack years: 7.00    Types: Cigarettes  . Smokeless tobacco: Never Used  Substance and Sexual Activity  . Alcohol use: Yes    Comment: Not since June 2014  . Drug use: Yes    Types: Cocaine, Marijuana    Comment: Patient denies any recent use of Marijuna and cocaine as of 07/02/12    LAST SMOKED  MARIJUANA-   LAST WEEK LAST USED COCAINE-    2015  . Sexual activity: Yes  Lifestyle  . Physical activity:    Days per week: Not on file    Minutes per session: Not on file  . Stress: Not on file  Relationships  . Social connections:    Talks on phone: Not on file     Gets together: Not on file    Attends religious service: Not on file    Active member of club or organization: Not on file    Attends meetings of clubs or organizations: Not on file    Relationship status: Not on file  . Intimate partner violence:    Fear of current or ex partner: Not on file    Emotionally abused: Not on file    Physically abused: Not on file    Forced sexual activity: Not on file  Other Topics Concern  . Not on file  Social History Narrative  . Not on file           Vanessa Kick, MD 03/30/18 [phone]

## 2018-03-30 NOTE — ED Triage Notes (Signed)
Pt c/o sore throat since Sunday. States it feels upper respiratory. Cough.

## 2018-03-30 NOTE — Discharge Instructions (Addendum)
Follow up with your primary care doctor or here if you are not seeing improvement of your symptoms over the next several days, sooner if you feel you are worsening.  Caring for yourself: Get plenty of rest. Drink plenty of fluids, enough so that your urine is light yellow or clear like water. If you have kidney, heart, or liver disease and have to limit fluids, talk with your doctor before you increase the amount of fluids you drink. Take an over-the-counter pain medicine if needed, such as acetaminophen (Tylenol), ibuprofen (Advil, Motrin), or naproxen (Aleve), to relieve fever, headache, and muscle aches. Read and follow all instructions on the label. No one younger than 20 should take aspirin. It has been linked to Reye syndrome, a serious illness. Before you use over the counter cough and cold medicines, check the label. These medicines may not be safe for children younger than age 55 or for people with certain health problems. If the skin around your nose and lips becomes sore, put some petroleum jelly on the area.  Avoid spreading upper respiratory infection: Wash your hands regularly, and keep your hands away from your face.  Stay home from school, work, and other public places until you are feeling better and your fever has been gone for at least 24 hours. The fever needs to have gone away on its own without the help of medicine.

## 2018-05-27 ENCOUNTER — Encounter (HOSPITAL_COMMUNITY): Payer: Self-pay

## 2018-05-27 ENCOUNTER — Ambulatory Visit (HOSPITAL_COMMUNITY)
Admission: EM | Admit: 2018-05-27 | Discharge: 2018-05-27 | Disposition: A | Discharge status: Home / Self Care | Payer: Medicaid Other | Attending: Family Medicine | Admitting: Family Medicine

## 2018-05-27 DIAGNOSIS — K0889 Other specified disorders of teeth and supporting structures: Secondary | ICD-10-CM | POA: Diagnosis not present

## 2018-05-27 MED ORDER — IBUPROFEN 800 MG PO TABS: 800.0000 mg | ORAL_TABLET | Freq: Three times a day (TID) | ORAL | 0 refills | Status: DC

## 2018-05-27 MED ORDER — AMOXICILLIN 875 MG PO TABS: 875.0000 mg | ORAL_TABLET | Freq: Two times a day (BID) | ORAL | 0 refills | Status: DC

## 2018-05-27 NOTE — Discharge Instructions (Addendum)
Take antibiotic 2 times a day for 7 full days Take 2 doses today, 1 now are more better Take ibuprofen 3 times a day with food.  This is as needed for pain Call your dentist for any problems persist with the tooth

## 2018-05-27 NOTE — ED Triage Notes (Signed)
Pt c/o rt side face pain d/t toothache and swollen lymph node to neck

## 2018-05-27 NOTE — ED Provider Notes (Signed)
[facility]    CSN: [id number] Arrival date & time: 05/27/18  1602     History   Chief Complaint Chief Complaint  Patient presents with  . Dental Pain    HPI Dawn Brock is a 33 y.o. female.   HPI  Dental pain right jaw , swollen face, swollen lymph node right  neck.  Increasingly painful.  called her dentist and cannot be seen until next week.  Past Medical History:  Diagnosis Date  . Abnormal Pap smear   . Anemia   . Gastritis   . Hx of chlamydia infection   . Vaginal Pap smear, abnormal     Patient Active Problem List   Diagnosis Date Noted  . Post-dates pregnancy 11/23/2016  . Indication for care in labor or delivery 11/23/2016  . H/O LEEP 08/21/2016  . Limited prenatal care in second trimester 08/21/2016  . Headache 08/21/2016  . Mild tetrahydrocannabinol (THC) abuse 08/21/2016  . History of substance abuse ([place]) 08/21/2016  . Cocaine abuse complicating pregnancy ([place]) 02/22/2013  . Alcohol dependence ([place]) 03/12/2012  . Cannabis dependence ([place]) 03/12/2012  . Executive function deficit 03/11/2012    Class: Acute  . Cigarette smoker 03/10/2012  . Duodenitis with nausea, vomiting, diarrhea and abdominal pain 03/10/2012    Past Surgical History:  Procedure Laterality Date  . HERNIA REPAIR     As an infant  . LEEP      OB History    Gravida  4   Para  4   Term  4   Preterm  0   AB  0   Living  4     SAB  0   TAB  0   Ectopic  0   Multiple  0   Live Births  4            Home Medications    Prior to Admission medications   Medication Sig Start Date End Date Taking? Authorizing Provider  amoxicillin (AMOXIL) 875 MG tablet Take 1 tablet (875 mg total) by mouth 2 (two) times daily. 05/27/18   Raylene Everts, MD  ibuprofen (ADVIL) 800 MG tablet Take 1 tablet (800 mg total) by mouth 3 (three) times daily. 05/27/18   Raylene Everts, MD    Family History Family History  Problem Relation Age of Onset  .  Hypertension Mother   . Fibromyalgia Mother   . Cirrhosis Father   . Kidney disease Father   . Asthma Brother   . Birth defects Son        hirschprungs    Social History Social History   Tobacco Use  . Smoking status: Current Every Day Smoker    Packs/day: 0.50    Years: 14.00    Pack years: 7.00    Types: Cigarettes  . Smokeless tobacco: Never Used  Substance Use Topics  . Alcohol use: Yes    Comment: Not since June 2014  . Drug use: Yes    Types: Cocaine, Marijuana    Comment: Patient denies any recent use of Marijuna and cocaine as of 07/02/12    LAST SMOKED  MARIJUANA-   LAST WEEK LAST USED COCAINE-    2015     Allergies   Tramadol   Review of Systems Review of Systems  Constitutional: Negative for chills and fever.  HENT: Positive for dental problem. Negative for ear pain and sore throat.   Eyes: Negative for pain and visual disturbance.  Respiratory: Negative for  cough and shortness of breath.   Cardiovascular: Negative for chest pain and palpitations.  Gastrointestinal: Negative for abdominal pain and vomiting.  Genitourinary: Negative for dysuria and hematuria.  Musculoskeletal: Negative for arthralgias and back pain.  Skin: Negative for color change and rash.  Neurological: Negative for seizures and syncope.  All other systems reviewed and are negative.    Physical Exam Triage Vital Signs ED Triage Vitals  Enc Vitals Group     BP 05/27/18 1624 (!) 140/92     Pulse Rate 05/27/18 1624 79     Resp 05/27/18 1624 18     Temp 05/27/18 1624 98.9 F (37.2 C)     Temp Source 05/27/18 1624 Oral     SpO2 05/27/18 1624 100 %     Weight --      Height --      Head Circumference --      Peak Flow --      Pain Score 05/27/18 1625 10     Pain Loc --      Pain Edu? --      Excl. in [place]? --    No data found.  Updated Vital Signs BP (!) 140/92 (BP Location: Right Arm)   Pulse 79   Temp 98.9 F (37.2 C) (Oral)   Resp 18   SpO2 100%   Visual Acuity  Right Eye Distance:   Left Eye Distance:   Bilateral Distance:    Right Eye Near:   Left Eye Near:    Bilateral Near:     Physical Exam Constitutional:      General: She is not in acute distress.    Appearance: She is well-developed.  HENT:     Head: Normocephalic and atraumatic.      Right Ear: Tympanic membrane and ear canal normal.     Left Ear: Tympanic membrane and ear canal normal.     Nose: Nose normal.  Eyes:     Conjunctiva/sclera: Conjunctivae normal.     Pupils: Pupils are equal, round, and reactive to light.  Neck:     Musculoskeletal: Normal range of motion.  Cardiovascular:     Rate and Rhythm: Normal rate.  Pulmonary:     Effort: Pulmonary effort is normal. No respiratory distress.  Abdominal:     General: There is no distension.     Palpations: Abdomen is soft.  Musculoskeletal: Normal range of motion.  Skin:    General: Skin is warm and dry.  Neurological:     Mental Status: She is alert.      UC Treatments / Results  Labs (all labs ordered are listed, but only abnormal results are displayed) Labs Reviewed - No data to display  EKG None  Radiology No results found.  Procedures Procedures (including critical care time)  Medications Ordered in UC Medications - No data to display  Initial Impression / Assessment and Plan / UC Course  I have reviewed the triage vital signs and the nursing notes.  Pertinent labs & imaging results that were available during my care of the patient were reviewed by me and considered in my medical decision making (see chart for details).    Emphasized the importance of dental follow-up to review records Final Clinical Impressions(s) / UC Diagnoses   Final diagnoses:  Pain, dental     Discharge Instructions     Take antibiotic 2 times a day for 7 full days Take 2 doses today, 1 now are more better Take ibuprofen 3  times a day with food.  This is as needed for pain Call your dentist for any problems  persist with the tooth   ED Prescriptions    Medication Sig Dispense Auth. Provider   ibuprofen (ADVIL) 800 MG tablet Take 1 tablet (800 mg total) by mouth 3 (three) times daily. 21 tablet Raylene Everts, MD   amoxicillin (AMOXIL) 875 MG tablet Take 1 tablet (875 mg total) by mouth 2 (two) times daily. 14 tablet Raylene Everts, MD     Controlled Substance Prescriptions [place] Controlled Substance Registry consulted? Not Applicable   Raylene Everts, MD 05/27/18 [phone]

## 2018-08-07 ENCOUNTER — Ambulatory Visit (INDEPENDENT_AMBULATORY_CARE_PROVIDER_SITE_OTHER): Payer: Medicaid Other

## 2018-08-07 ENCOUNTER — Encounter (HOSPITAL_COMMUNITY): Payer: Self-pay | Admitting: Emergency Medicine

## 2018-08-07 ENCOUNTER — Ambulatory Visit (HOSPITAL_COMMUNITY)
Admission: EM | Admit: 2018-08-07 | Discharge: 2018-08-07 | Disposition: A | Discharge status: Home / Self Care | Payer: Medicaid Other | Attending: Emergency Medicine | Admitting: Emergency Medicine

## 2018-08-07 DIAGNOSIS — W19XXXA Unspecified fall, initial encounter: Secondary | ICD-10-CM | POA: Diagnosis not present

## 2018-08-07 DIAGNOSIS — S8392XA Sprain of unspecified site of left knee, initial encounter: Secondary | ICD-10-CM

## 2018-08-07 MED ORDER — NAPROXEN 500 MG PO TABS: 500.0000 mg | ORAL_TABLET | Freq: Two times a day (BID) | ORAL | 0 refills | Status: DC

## 2018-08-07 NOTE — ED Provider Notes (Signed)
[place]    CSN: [id number] Arrival date & time: 08/07/18  1113      History   Chief Complaint Chief Complaint  Patient presents with  . Leg Pain  . Fall    HPI Dawn Brock is a 33 y.o. female.   Dawn Brock presents with complaints of  Left knee pain after a fall 7/21. She was running and tripped and fell, landing on both of her knees. Immediate pain. Pain with weight bearing. Hasn't been taking any medications for symptoms. Has been swelling. She has applied ice. No previous injury to the knee. No numbness or tingling. Pain with ROM and weight bearing. Without contributing medical history.     ROS per HPI, negative if not otherwise mentioned.      Past Medical History:  Diagnosis Date  . Abnormal Pap smear   . Anemia   . Gastritis   . Hx of chlamydia infection   . Vaginal Pap smear, abnormal     Patient Active Problem List   Diagnosis Date Noted  . Post-dates pregnancy 11/23/2016  . Indication for care in labor or delivery 11/23/2016  . H/O LEEP 08/21/2016  . Limited prenatal care in second trimester 08/21/2016  . Headache 08/21/2016  . Mild tetrahydrocannabinol (THC) abuse 08/21/2016  . History of substance abuse ([place]) 08/21/2016  . Cocaine abuse complicating pregnancy ([place]) 02/22/2013  . Alcohol dependence ([place]) 03/12/2012  . Cannabis dependence ([place]) 03/12/2012  . Executive function deficit 03/11/2012    Class: Acute  . Cigarette smoker 03/10/2012  . Duodenitis with nausea, vomiting, diarrhea and abdominal pain 03/10/2012    Past Surgical History:  Procedure Laterality Date  . HERNIA REPAIR     As an infant  . LEEP      OB History    Gravida  4   Para  4   Term  4   Preterm  0   AB  0   Living  4     SAB  0   TAB  0   Ectopic  0   Multiple  0   Live Births  4            Home Medications    Prior to Admission medications   Medication Sig Start Date End Date Taking? Authorizing Provider   amoxicillin (AMOXIL) 875 MG tablet Take 1 tablet (875 mg total) by mouth 2 (two) times daily. 05/27/18   Raylene Everts, MD  ibuprofen (ADVIL) 800 MG tablet Take 1 tablet (800 mg total) by mouth 3 (three) times daily. 05/27/18   Raylene Everts, MD  naproxen (NAPROSYN) 500 MG tablet Take 1 tablet (500 mg total) by mouth 2 (two) times daily. 08/07/18   Zigmund Gottron, NP    Family History Family History  Problem Relation Age of Onset  . Hypertension Mother   . Fibromyalgia Mother   . Cirrhosis Father   . Kidney disease Father   . Asthma Brother   . Birth defects Son        hirschprungs    Social History Social History   Tobacco Use  . Smoking status: Current Every Day Smoker    Packs/day: 0.50    Years: 14.00    Pack years: 7.00    Types: Cigarettes  . Smokeless tobacco: Never Used  Substance Use Topics  . Alcohol use: Yes    Comment: Not since June 2014  . Drug use: Yes    Types: Cocaine,  Marijuana    Comment: Patient denies any recent use of Marijuna and cocaine as of 07/02/12    LAST SMOKED  MARIJUANA-   LAST WEEK LAST USED COCAINE-    2015     Allergies   Tramadol   Review of Systems Review of Systems   Physical Exam Triage Vital Signs ED Triage Vitals  Enc Vitals Group     BP 08/07/18 1213 (!) 123/58     Pulse Rate 08/07/18 1213 66     Resp 08/07/18 1213 18     Temp 08/07/18 1213 97.9 F (36.6 C)     Temp src --      SpO2 08/07/18 1213 100 %     Weight --      Height --      Head Circumference --      Peak Flow --      Pain Score 08/07/18 1214 10     Pain Loc --      Pain Edu? --      Excl. in [place]? --    No data found.  Updated Vital Signs BP (!) 123/58   Pulse 66   Temp 97.9 F (36.6 C)   Resp 18   LMP 07/30/2018   SpO2 100%   Visual Acuity Right Eye Distance:   Left Eye Distance:   Bilateral Distance:    Right Eye Near:   Left Eye Near:    Bilateral Near:     Physical Exam Constitutional:      General: She is not in  acute distress.    Appearance: She is well-developed.  Cardiovascular:     Rate and Rhythm: Normal rate.  Pulmonary:     Effort: Pulmonary effort is normal.  Musculoskeletal:     Left knee: She exhibits decreased range of motion and swelling. She exhibits no effusion, no ecchymosis, no deformity, no laceration, no erythema, normal alignment and no bony tenderness. Tenderness found. Medial joint line and lateral joint line tenderness noted. No patellar tendon tenderness noted.     Comments: Pain primarily to posterior knee as well as lateral and medial aspects; generalized swelling without redness or warmth; pain with extension, mild pain with flexion; no obvious laxity   Skin:    General: Skin is warm and dry.  Neurological:     Mental Status: She is alert and oriented to person, place, and time.      UC Treatments / Results  Labs (all labs ordered are listed, but only abnormal results are displayed) Labs Reviewed - No data to display  EKG   Radiology Dg Knee Complete 4 Views Left  Result Date: 08/07/2018 CLINICAL DATA:  Fall, pain EXAM: LEFT KNEE - COMPLETE 4+ VIEW COMPARISON:  None. FINDINGS: No fracture or dislocation of the left knee. Joint spaces are well preserved. No significant knee joint effusion. Soft tissue edema about the anterior extensor mechanism. IMPRESSION: No fracture or dislocation of the left knee. Joint spaces are well preserved. No significant knee joint effusion. Soft tissue edema about the anterior extensor mechanism. Electronically Signed   By: Eddie Candle M.D.   On: 08/07/2018 13:16    Procedures Procedures (including critical care time)  Medications Ordered in UC Medications - No data to display  Initial Impression / Assessment and Plan / UC Course  I have reviewed the triage vital signs and the nursing notes.  Pertinent labs & imaging results that were available during my care of the patient were reviewed by me  and considered in my medical decision  making (see chart for details).    Xray without acute findings today. Sprain s/p fall. meniscus injury? Brace and crutches provided. Pain management discussed. Follow up with orthopedics and/or sports medicine as needed for persistent symptoms. Patient verbalized understanding and agreeable to plan.   Final Clinical Impressions(s) / UC Diagnoses   Final diagnoses:  Sprain of left knee, unspecified ligament, initial encounter  Fall, initial encounter     Discharge Instructions     Light and regular activity as tolerated.  Sleeve for support and compresion.  Ice and elevation, especially after activity.  Naproxen twice a day, take with food.  See exercises provided.  Follow up with orthopedics or sports medicine as needed for persistent symptoms.    ED Prescriptions    Medication Sig Dispense Auth. Provider   naproxen (NAPROSYN) 500 MG tablet Take 1 tablet (500 mg total) by mouth 2 (two) times daily. 30 tablet Zigmund Gottron, NP     Controlled Substance Prescriptions [place] Controlled Substance Registry consulted? Not Applicable   Zigmund Gottron, NP 08/07/18 2209

## 2018-08-07 NOTE — ED Triage Notes (Signed)
Pt states on the 14th she fell and landed on her L knee, c/o ongoing pain and swelling since then.

## 2018-08-07 NOTE — Discharge Instructions (Signed)
Light and regular activity as tolerated.  Sleeve for support and compresion.  Ice and elevation, especially after activity.  Naproxen twice a day, take with food.  See exercises provided.  Follow up with orthopedics or sports medicine as needed for persistent symptoms.

## 2018-08-12 ENCOUNTER — Ambulatory Visit: Payer: Medicaid Other | Admitting: Family Medicine

## 2018-08-30 ENCOUNTER — Other Ambulatory Visit: Payer: Self-pay

## 2018-08-30 ENCOUNTER — Ambulatory Visit: Payer: Medicaid Other | Admitting: Nurse Practitioner

## 2018-09-05 ENCOUNTER — Ambulatory Visit: Payer: Medicaid Other | Admitting: Nurse Practitioner

## 2018-09-05 ENCOUNTER — Other Ambulatory Visit: Payer: Self-pay

## 2018-09-05 ENCOUNTER — Encounter: Payer: Self-pay | Admitting: Nurse Practitioner

## 2018-09-05 VITALS — BP 122/76 | HR 83 | Temp 98.8°F | Ht 66.2 in | Wt 199.2 lb

## 2018-09-05 DIAGNOSIS — Z23 Encounter for immunization: Secondary | ICD-10-CM | POA: Diagnosis not present

## 2018-09-05 DIAGNOSIS — Z111 Encounter for screening for respiratory tuberculosis: Secondary | ICD-10-CM | POA: Diagnosis not present

## 2018-09-05 NOTE — Progress Notes (Signed)
  Subjective:     Patient ID: Dawn Brock , female    DOB: 1985/10/04 , 33 y.o.   MRN: [id number]   Chief Complaint  Patient presents with  . 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      Past Medical History:  Diagnosis Date  . Abnormal Pap smear   . Anemia   . Gastritis   . Hx of chlamydia infection   . Vaginal Pap smear, abnormal      Family History  Problem Relation Age of Onset  . Hypertension Mother   . Fibromyalgia Mother   . Cirrhosis Father   . Kidney disease Father   . Asthma Brother   . Birth defects Son        hirschprungs    No current outpatient medications on file.   Allergies  Allergen Reactions  . Tramadol Nausea And Vomiting     Review of Systems  Constitutional: Negative.   Respiratory: Negative.  Negative for cough and wheezing.   Cardiovascular: Negative.  Negative for chest pain, palpitations and leg swelling.  Neurological: Negative.  Negative for dizziness and headaches.  Psychiatric/Behavioral: Negative.      Today's Vitals   09/05/18 1157  BP: 122/76  Pulse: 83  Temp: 98.8 F (37.1 C)  TempSrc: Oral  Weight: 199 lb 3.2 oz (90.4 kg)  Height: 5' 6.2" (1.681 m)  PainSc: 0-No pain   Body mass index is 31.96 kg/m.   Objective:  Physical Exam Constitutional:      General: She is not in acute distress.    Appearance: Normal appearance. She is obese.  Cardiovascular:     Rate and Rhythm: Normal rate and regular rhythm.     Pulses: Normal pulses.     Heart sounds: Normal heart sounds. No murmur.  Pulmonary:     Effort: Pulmonary effort is normal. No respiratory distress.     Breath sounds: Normal breath sounds.  Skin:    General: Skin is warm and dry.     Capillary Refill: Capillary refill takes less than 2 seconds.  Neurological:     General: No focal deficit present.     Mental Status: She is alert and oriented to  person, place, and time.  Psychiatric:        Mood and Affect: Mood normal.        Behavior: Behavior normal.        Thought Content: Thought content normal.        Judgment: Judgment normal.         Assessment And Plan:     1. Screening-pulmonary TB  Pre-employment TB quantiferon today - QuantiFERON-TB Gold Plus  2. Need for immunization against influenza  Influenza vaccine given today in office  Advised may get low grade fever and can take Tylenol as needed - Flu Vaccine QUAD 6+ mos PF IM (Fluarix Quad PF)   Minette Brine, FNP    THE PATIENT IS ENCOURAGED TO PRACTICE SOCIAL DISTANCING DUE TO THE COVID-19 PANDEMIC.

## 2018-09-05 NOTE — Patient Instructions (Signed)
Influenza Virus Vaccine (Flucelvax) What is this medicine? INFLUENZA VIRUS VACCINE (in floo EN zuh VAHY ruhs vak SEEN) helps to reduce the risk of getting influenza also known as the flu. The vaccine only helps protect you against some strains of the flu. This medicine may be used for other purposes; ask your health care provider or pharmacist if you have questions. COMMON BRAND NAME(S): FLUCELVAX What should I tell my health care provider before I take this medicine? They need to know if you have any of these conditions:  bleeding disorder like hemophilia  fever or infection  Guillain-Barre syndrome or other neurological problems  immune system problems  infection with the human immunodeficiency virus (HIV) or AIDS  low blood platelet counts  multiple sclerosis  an unusual or allergic reaction to influenza virus vaccine, other medicines, foods, dyes or preservatives  pregnant or trying to get pregnant  breast-feeding How should I use this medicine? This vaccine is for injection into a muscle. It is given by a health care professional. A copy of Vaccine Information Statements will be given before each vaccination. Read this sheet carefully each time. The sheet may change frequently. Talk to your pediatrician regarding the use of this medicine in children. Special care may be needed. Overdosage: If you think you've taken too much of this medicine contact a poison control center or emergency room at once. Overdosage: If you think you have taken too much of this medicine contact a poison control center or emergency room at once. NOTE: This medicine is only for you. Do not share this medicine with others. What if I miss a dose? This does not apply. What may interact with this medicine?  chemotherapy or radiation therapy  medicines that lower your immune system like etanercept, anakinra, infliximab, and adalimumab  medicines that treat or prevent blood clots like  warfarin  phenytoin  steroid medicines like prednisone or cortisone  theophylline  vaccines This list may not describe all possible interactions. Give your health care provider a list of all the medicines, herbs, non-prescription drugs, or dietary supplements you use. Also tell them if you smoke, drink alcohol, or use illegal drugs. Some items may interact with your medicine. What should I watch for while using this medicine? Report any side effects that do not go away within 3 days to your doctor or health care professional. Call your health care provider if any unusual symptoms occur within 6 weeks of receiving this vaccine. You may still catch the flu, but the illness is not usually as bad. You cannot get the flu from the vaccine. The vaccine will not protect against colds or other illnesses that may cause fever. The vaccine is needed every year. What side effects may I notice from receiving this medicine? Side effects that you should report to your doctor or health care professional as soon as possible:  allergic reactions like skin rash, itching or hives, swelling of the face, lips, or tongue Side effects that usually do not require medical attention (Report these to your doctor or health care professional if they continue or are bothersome.):  fever  headache  muscle aches and pains  pain, tenderness, redness, or swelling at the injection site  tiredness This list may not describe all possible side effects. Call your doctor for medical advice about side effects. You may report side effects to FDA at 1-800-FDA-1088. Where should I keep my medicine? The vaccine will be given by a health care professional in a clinic, pharmacy, doctor's   office, or other health care setting. You will not be given vaccine doses to store at home. NOTE: This sheet is a summary. It may not cover all possible information. If you have questions about this medicine, talk to your doctor, pharmacist, or  health care provider.  2020 Elsevier/Gold Standard (2010-12-10 14:06:47)  

## 2018-09-07 LAB — QUANTIFERON-TB GOLD PLUS
QuantiFERON Mitogen Value: >10 IU/mL
QuantiFERON Nil Value: 0.05 IU/mL
QuantiFERON TB1 Ag Value: 0.03 IU/mL
QuantiFERON TB2 Ag Value: 0.02 IU/mL
QuantiFERON-TB Gold Plus: NEGATIVE

## 2018-09-19 ENCOUNTER — Ambulatory Visit (HOSPITAL_COMMUNITY)
Admission: EM | Admit: 2018-09-19 | Discharge: 2018-09-19 | Disposition: A | Discharge status: Home / Self Care | Payer: Medicaid Other | Attending: Family Medicine | Admitting: Family Medicine

## 2018-09-19 ENCOUNTER — Encounter (HOSPITAL_COMMUNITY): Payer: Self-pay

## 2018-09-19 ENCOUNTER — Other Ambulatory Visit: Payer: Self-pay

## 2018-09-19 DIAGNOSIS — R509 Fever, unspecified: Secondary | ICD-10-CM | POA: Diagnosis not present

## 2018-09-19 DIAGNOSIS — R0981 Nasal congestion: Secondary | ICD-10-CM

## 2018-09-19 DIAGNOSIS — R6889 Other general symptoms and signs: Secondary | ICD-10-CM | POA: Diagnosis present

## 2018-09-19 DIAGNOSIS — Z20822 Contact with and (suspected) exposure to covid-19: Secondary | ICD-10-CM

## 2018-09-19 DIAGNOSIS — R059 Cough, unspecified: Secondary | ICD-10-CM

## 2018-09-19 DIAGNOSIS — Z20828 Contact with and (suspected) exposure to other viral communicable diseases: Secondary | ICD-10-CM | POA: Diagnosis not present

## 2018-09-19 DIAGNOSIS — R05 Cough: Secondary | ICD-10-CM | POA: Diagnosis not present

## 2018-09-19 DIAGNOSIS — J3489 Other specified disorders of nose and nasal sinuses: Secondary | ICD-10-CM

## 2018-09-19 MED ORDER — GUAIFENESIN ER 600 MG PO TB12: 1200.0000 mg | ORAL_TABLET | Freq: Two times a day (BID) | ORAL | 0 refills | Status: DC | PRN

## 2018-09-19 NOTE — ED Triage Notes (Signed)
Patient presents to Urgent Care with complaints of nasal congestion and mild cough since 3 days ago. Patient reports she felt like she had a low-grade fever, needs note to be out of work. Has been taking cough suppressant at home with some improvement.

## 2018-09-19 NOTE — ED Notes (Signed)
Sample placed in lab, verified label

## 2018-09-19 NOTE — ED Provider Notes (Signed)
[place]    CSN: [id number] Arrival date & time: 09/19/18  0849      History   Chief Complaint Chief Complaint  Patient presents with  . Nasal Congestion    HPI Dawn Brock is a 33 y.o. female.   Patient presents with 3-day history of nasal congestion, rhinorrhea, nonproductive cough, low-grade fever.  She denies shortness of breath, abdominal pain, vomiting, diarrhea, or other symptoms.  The history is provided by the patient.    Past Medical History:  Diagnosis Date  . Abnormal Pap smear   . Anemia   . Gastritis   . Hx of chlamydia infection   . Vaginal Pap smear, abnormal     Patient Active Problem List   Diagnosis Date Noted  . Post-dates pregnancy 11/23/2016  . Indication for care in labor or delivery 11/23/2016  . H/O LEEP 08/21/2016  . Limited prenatal care in second trimester 08/21/2016  . Headache 08/21/2016  . Mild tetrahydrocannabinol (THC) abuse 08/21/2016  . History of substance abuse ([place]) 08/21/2016  . Cocaine abuse complicating pregnancy ([place]) 02/22/2013  . Alcohol dependence ([place]) 03/12/2012  . Cannabis dependence ([place]) 03/12/2012  . Executive function deficit 03/11/2012    Class: Acute  . Cigarette smoker 03/10/2012  . Duodenitis with nausea, vomiting, diarrhea and abdominal pain 03/10/2012    Past Surgical History:  Procedure Laterality Date  . HERNIA REPAIR     As an infant  . LEEP      OB History    Gravida  4   Para  4   Term  4   Preterm  0   AB  0   Living  4     SAB  0   TAB  0   Ectopic  0   Multiple  0   Live Births  4            Home Medications    Prior to Admission medications   Medication Sig Start Date End Date Taking? Authorizing Provider  guaiFENesin (MUCINEX) 600 MG 12 hr tablet Take 2 tablets (1,200 mg total) by mouth 2 (two) times daily as needed for cough. 09/19/18   Sharion Balloon, NP    Family History Family History  Problem Relation Age of Onset  . Hypertension  Mother   . Fibromyalgia Mother   . Cirrhosis Father   . Kidney disease Father   . Asthma Brother   . Birth defects Son        hirschprungs    Social History Social History   Tobacco Use  . Smoking status: Current Every Day Smoker    Packs/day: 0.50    Years: 14.00    Pack years: 7.00    Types: Cigarettes  . Smokeless tobacco: Never Used  Substance Use Topics  . Alcohol use: Yes    Comment: Not since June 2014  . Drug use: Yes    Types: Cocaine, Marijuana    Comment: Patient denies any recent use of Marijuna and cocaine as of 07/02/12    LAST SMOKED  MARIJUANA-   LAST WEEK LAST USED COCAINE-    2015     Allergies   Tramadol   Review of Systems Review of Systems  Constitutional: Negative for chills and fever.  HENT: Positive for congestion and rhinorrhea. Negative for ear pain and sore throat.   Eyes: Negative for pain and visual disturbance.  Respiratory: Positive for cough. Negative for shortness of breath.   Cardiovascular: Negative  for chest pain and palpitations.  Gastrointestinal: Negative for abdominal pain and vomiting.  Genitourinary: Negative for dysuria and hematuria.  Musculoskeletal: Negative for arthralgias and back pain.  Skin: Negative for color change and rash.  Neurological: Negative for seizures and syncope.  All other systems reviewed and are negative.    Physical Exam Triage Vital Signs ED Triage Vitals  Enc Vitals Group     BP 09/19/18 1018 (!) 135/95     Pulse Rate 09/19/18 1018 69     Resp 09/19/18 1018 16     Temp 09/19/18 1018 97.9 F (36.6 C)     Temp Source 09/19/18 1018 Temporal     SpO2 09/19/18 1018 100 %     Weight --      Height --      Head Circumference --      Peak Flow --      Pain Score 09/19/18 1017 3     Pain Loc --      Pain Edu? --      Excl. in [place]? --    No data found.  Updated Vital Signs BP (!) 135/95 (BP Location: Right Arm)   Pulse 69   Temp 97.9 F (36.6 C) (Temporal)   Resp 16   SpO2 100%    Visual Acuity Right Eye Distance:   Left Eye Distance:   Bilateral Distance:    Right Eye Near:   Left Eye Near:    Bilateral Near:     Physical Exam Vitals signs and nursing note reviewed.  Constitutional:      General: She is not in acute distress.    Appearance: She is well-developed.  HENT:     Head: Normocephalic and atraumatic.     Right Ear: Tympanic membrane normal.     Left Ear: Tympanic membrane normal.     Nose: Congestion and rhinorrhea present.     Mouth/Throat:     Mouth: Mucous membranes are moist.     Pharynx: Oropharynx is clear.  Eyes:     Conjunctiva/sclera: Conjunctivae normal.  Neck:     Musculoskeletal: Neck supple.  Cardiovascular:     Rate and Rhythm: Normal rate and regular rhythm.     Heart sounds: Normal heart sounds. No murmur.  Pulmonary:     Effort: Pulmonary effort is normal. No respiratory distress.     Breath sounds: Normal breath sounds. No wheezing or rhonchi.  Abdominal:     General: Bowel sounds are normal.     Palpations: Abdomen is soft.     Tenderness: There is no abdominal tenderness. There is no guarding or rebound.  Skin:    General: Skin is warm and dry.     Findings: No rash.  Neurological:     Mental Status: She is alert.      UC Treatments / Results  Labs (all labs ordered are listed, but only abnormal results are displayed) Labs Reviewed  NOVEL CORONAVIRUS, NAA (HOSP ORDER, SEND-OUT TO REF LAB; TAT 18-24 HRS)    EKG   Radiology No results found.  Procedures Procedures (including critical care time)  Medications Ordered in UC Medications - No data to display  Initial Impression / Assessment and Plan / UC Course  I have reviewed the triage vital signs and the nursing notes.  Pertinent labs & imaging results that were available during my care of the patient were reviewed by me and considered in my medical decision making (see chart for details).  Cough, suspected COVID.  Treating with Mucinex.  COVID  test performed here.  Discussed with patient that she should self quarantine until her cover test result is back and is negative.  Discussed that she should go to the emergency department if she has high fever, shortness of breath, severe diarrhea, or other concerns.  Patient agrees with plan of care.     Final Clinical Impressions(s) / UC Diagnoses   Final diagnoses:  Cough  Suspected Covid-19 Virus Infection     Discharge Instructions     Take the prescribed Mucinex as needed.    Your COVID test is pending.  You should self quarantine until your test result is back and is negative.    Go to the emergency department if you develop shortness of breath, high fever, severe diarrhea, or other concerning symptoms.         ED Prescriptions    Medication Sig Dispense Auth. Provider   guaiFENesin (MUCINEX) 600 MG 12 hr tablet Take 2 tablets (1,200 mg total) by mouth 2 (two) times daily as needed for cough. 12 tablet Sharion Balloon, NP     Controlled Substance Prescriptions [place] Controlled Substance Registry consulted? Not Applicable   Sharion Balloon, NP 09/19/18 1034

## 2018-09-19 NOTE — Discharge Instructions (Signed)
Take the prescribed Mucinex as needed.    Your COVID test is pending.  You should self quarantine until your test result is back and is negative.    Go to the emergency department if you develop shortness of breath, high fever, severe diarrhea, or other concerning symptoms.

## 2018-09-20 LAB — NOVEL CORONAVIRUS, NAA (HOSP ORDER, SEND-OUT TO REF LAB; TAT 18-24 HRS): SARS-CoV-2, NAA: NOT DETECTED

## 2018-11-08 ENCOUNTER — Encounter: Payer: Medicaid Other | Admitting: Nurse Practitioner

## 2018-11-16 ENCOUNTER — Other Ambulatory Visit: Payer: Self-pay

## 2018-11-16 ENCOUNTER — Encounter (HOSPITAL_COMMUNITY): Payer: Self-pay | Admitting: Emergency Medicine

## 2018-11-16 ENCOUNTER — Ambulatory Visit (HOSPITAL_COMMUNITY)
Admission: EM | Admit: 2018-11-16 | Discharge: 2018-11-16 | Disposition: A | Discharge status: Home / Self Care | Payer: Medicaid Other | Attending: Family Medicine | Admitting: Family Medicine

## 2018-11-16 DIAGNOSIS — N898 Other specified noninflammatory disorders of vagina: Secondary | ICD-10-CM | POA: Diagnosis not present

## 2018-11-16 LAB — POCT URINALYSIS DIP (DEVICE)
Bilirubin Urine: NEGATIVE
Glucose, UA: NEGATIVE mg/dL
Hgb urine dipstick: NEGATIVE
Ketones, ur: NEGATIVE mg/dL
Leukocytes,Ua: NEGATIVE
Nitrite: NEGATIVE
Protein, ur: NEGATIVE mg/dL
Specific Gravity, Urine: 1.025 (ref 1.005–1.030)
Urobilinogen, UA: 1 mg/dL (ref 0.0–1.0)
pH: 7.5 (ref 5.0–8.0)

## 2018-11-16 MED ORDER — METRONIDAZOLE 500 MG PO TABS: 500.0000 mg | ORAL_TABLET | Freq: Two times a day (BID) | ORAL | 0 refills | Status: DC

## 2018-11-16 MED ORDER — FLUCONAZOLE 150 MG PO TABS: 150.0000 mg | ORAL_TABLET | Freq: Every day | ORAL | 0 refills | Status: DC

## 2018-11-16 NOTE — ED Triage Notes (Signed)
Pt reports vaginal discomfort, mild discharge, urinary odor and dark urine and some urinary leakage for about one month.

## 2018-11-16 NOTE — Discharge Instructions (Addendum)
The urine test is clear, no infection You need to drink more water We will do a swab for infection You will be called if anything is positive I am giving you medicine for infection to clear up any yeast or BV

## 2018-11-16 NOTE — ED Provider Notes (Signed)
[facility]    CSN: [id number] Arrival date & time: 11/16/18  1051      History   Chief Complaint Chief Complaint  Patient presents with  . Vaginal Discharge    HPI Dawn Brock is a 33 y.o. female.   HPI  Dark urine with strong odor for a month.  Increased urine leakage when bladder full.  NO pelvic pain, fever, flank pain.  Also has vag discharge.  No itching or discomfort.  Wants STD testing. monogamous with one man for 7 months Is not using condoms, has IUD for pregnancy prevention 4 children at home with infant of 1 years  Past Medical History:  Diagnosis Date  . Abnormal Pap smear   . Anemia   . Gastritis   . Hx of chlamydia infection   . Vaginal Pap smear, abnormal     Patient Active Problem List   Diagnosis Date Noted  . H/O LEEP 08/21/2016  . Headache 08/21/2016  . Mild tetrahydrocannabinol (THC) abuse 08/21/2016  . History of substance abuse ([place]) 08/21/2016  . Cocaine abuse complicating pregnancy ([place]) 02/22/2013  . Alcohol dependence ([place]) 03/12/2012  . Cannabis dependence ([place]) 03/12/2012  . Executive function deficit 03/11/2012    Class: Acute  . Cigarette smoker 03/10/2012  . Duodenitis with nausea, vomiting, diarrhea and abdominal pain 03/10/2012    Past Surgical History:  Procedure Laterality Date  . HERNIA REPAIR     As an infant  . LEEP      OB History    Gravida  4   Para  4   Term  4   Preterm  0   AB  0   Living  4     SAB  0   TAB  0   Ectopic  0   Multiple  0   Live Births  4            Home Medications    Prior to Admission medications   Medication Sig Start Date End Date Taking? Authorizing Provider  fluconazole (DIFLUCAN) 150 MG tablet Take 1 tablet (150 mg total) by mouth daily. Repeat in 1 week if needed 11/16/18   Raylene Everts, MD  metroNIDAZOLE (FLAGYL) 500 MG tablet Take 1 tablet (500 mg total) by mouth 2 (two) times daily. 11/16/18   Raylene Everts, MD    Family  History Family History  Problem Relation Age of Onset  . Hypertension Mother   . Fibromyalgia Mother   . Cirrhosis Father   . Kidney disease Father   . Asthma Brother   . Birth defects Son        hirschprungs    Social History Social History   Tobacco Use  . Smoking status: Current Every Day Smoker    Packs/day: 0.50    Years: 14.00    Pack years: 7.00    Types: Cigarettes  . Smokeless tobacco: Never Used  Substance Use Topics  . Alcohol use: Yes    Comment: Not since June 2014  . Drug use: Yes    Types: Cocaine, Marijuana    Comment: Patient denies any recent use of [place] and cocaine as of 07/02/12    LAST SMOKED  MARIJUANA-   LAST WEEK LAST USED COCAINE-    2015     Allergies   Tramadol   Review of Systems Review of Systems  Constitutional: Negative for chills and fever.  HENT: Negative for ear pain and sore throat.   Eyes:  Negative for pain and visual disturbance.  Respiratory: Negative for cough and shortness of breath.   Cardiovascular: Negative for chest pain and palpitations.  Gastrointestinal: Negative for abdominal pain and vomiting.  Genitourinary: Positive for dysuria, frequency and vaginal discharge. Negative for hematuria.  Musculoskeletal: Negative for arthralgias and back pain.  Skin: Negative for color change and rash.  Neurological: Negative for seizures and syncope.  All other systems reviewed and are negative.    Physical Exam Triage Vital Signs ED Triage Vitals  Enc Vitals Group     BP 11/16/18 1121 128/90     Pulse Rate 11/16/18 1121 78     Resp 11/16/18 1121 18     Temp 11/16/18 1121 97.8 F (36.6 C)     Temp Source 11/16/18 1121 Temporal     SpO2 11/16/18 1121 100 %     Weight --      Height --      Head Circumference --      Peak Flow --      Pain Score 11/16/18 1159 9     Pain Loc --      Pain Edu? --      Excl. in [place]? --    No data found.  Updated Vital Signs BP 128/90 (BP Location: Left Arm)   Pulse 78   Temp  97.8 F (36.6 C) (Temporal)   Resp 18   SpO2 100%       Physical Exam Constitutional:      General: She is not in acute distress.    Appearance: She is well-developed.  HENT:     Head: Normocephalic and atraumatic.  Eyes:     Conjunctiva/sclera: Conjunctivae normal.     Pupils: Pupils are equal, round, and reactive to light.  Neck:     Musculoskeletal: Normal range of motion.  Cardiovascular:     Rate and Rhythm: Normal rate and regular rhythm.     Heart sounds: Normal heart sounds.  Pulmonary:     Effort: Pulmonary effort is normal. No respiratory distress.     Breath sounds: Normal breath sounds.  Abdominal:     General: Abdomen is flat. There is no distension.     Palpations: Abdomen is soft.     Tenderness: There is no abdominal tenderness. There is no right CVA tenderness or left CVA tenderness.  Genitourinary:    Comments: Denies vaginal irritation or rash/lesions Musculoskeletal: Normal range of motion.  Skin:    General: Skin is warm and dry.  Neurological:     Mental Status: She is alert.  Psychiatric:        Mood and Affect: Mood normal.        Behavior: Behavior normal.      UC Treatments / Results  Labs (all labs ordered are listed, but only abnormal results are displayed) Labs Reviewed  POCT URINALYSIS DIP (DEVICE)  CERVICOVAGINAL ANCILLARY ONLY    EKG   Radiology No results found.  Procedures Procedures (including critical care time)  Medications Ordered in UC Medications - No data to display  Initial Impression / Assessment and Plan / UC Course  I have reviewed the triage vital signs and the nursing notes.  Pertinent labs & imaging results that were available during my care of the patient were reviewed by me and considered in my medical decision making (see chart for details).     Urine negative.  Will treat vaginitis and send STD testing.  Declines bloodwork Final Clinical Impressions(s) / UC  Diagnoses   Final diagnoses:   Vaginal discharge     Discharge Instructions     The urine test is clear, no infection You need to drink more water We will do a swab for infection You will be called if anything is positive I am giving you medicine for infection to clear up any yeast or BV    ED Prescriptions    Medication Sig Dispense Auth. Provider   metroNIDAZOLE (FLAGYL) 500 MG tablet Take 1 tablet (500 mg total) by mouth 2 (two) times daily. 14 tablet Raylene Everts, MD   fluconazole (DIFLUCAN) 150 MG tablet Take 1 tablet (150 mg total) by mouth daily. Repeat in 1 week if needed 2 tablet Raylene Everts, MD     PDMP not reviewed this encounter.   Raylene Everts, MD 11/16/18 1304

## 2018-11-17 LAB — CERVICOVAGINAL ANCILLARY ONLY
Chlamydia: NEGATIVE
Neisseria Gonorrhea: NEGATIVE
Trichomonas: NEGATIVE

## 2018-11-23 ENCOUNTER — Encounter: Payer: Medicaid Other | Admitting: Nurse Practitioner

## 2019-03-05 ENCOUNTER — Encounter (HOSPITAL_COMMUNITY): Payer: Self-pay

## 2019-03-05 ENCOUNTER — Emergency Department (HOSPITAL_COMMUNITY)
Admission: EM | Admit: 2019-03-05 | Discharge: 2019-03-05 | Disposition: A | Discharge status: Home / Self Care | Payer: Medicaid Other | Attending: Emergency Medicine | Admitting: Emergency Medicine

## 2019-03-05 ENCOUNTER — Emergency Department (HOSPITAL_COMMUNITY): Payer: Medicaid Other

## 2019-03-05 ENCOUNTER — Ambulatory Visit (HOSPITAL_COMMUNITY)
Admission: EM | Admit: 2019-03-05 | Discharge: 2019-03-05 | Disposition: A | Discharge status: Home / Self Care | Payer: Medicaid Other | Attending: Emergency Medicine | Admitting: Emergency Medicine

## 2019-03-05 ENCOUNTER — Other Ambulatory Visit: Payer: Self-pay

## 2019-03-05 DIAGNOSIS — N739 Female pelvic inflammatory disease, unspecified: Secondary | ICD-10-CM | POA: Diagnosis not present

## 2019-03-05 DIAGNOSIS — R102 Pelvic and perineal pain: Secondary | ICD-10-CM | POA: Diagnosis not present

## 2019-03-05 DIAGNOSIS — R1031 Right lower quadrant pain: Secondary | ICD-10-CM

## 2019-03-05 DIAGNOSIS — Z3202 Encounter for pregnancy test, result negative: Secondary | ICD-10-CM

## 2019-03-05 DIAGNOSIS — B9689 Other specified bacterial agents as the cause of diseases classified elsewhere: Secondary | ICD-10-CM

## 2019-03-05 DIAGNOSIS — N760 Acute vaginitis: Secondary | ICD-10-CM | POA: Insufficient documentation

## 2019-03-05 DIAGNOSIS — N730 Acute parametritis and pelvic cellulitis: Secondary | ICD-10-CM

## 2019-03-05 DIAGNOSIS — F17210 Nicotine dependence, cigarettes, uncomplicated: Secondary | ICD-10-CM | POA: Diagnosis not present

## 2019-03-05 LAB — URINALYSIS, ROUTINE W REFLEX MICROSCOPIC
Bilirubin Urine: NEGATIVE
Glucose, UA: NEGATIVE mg/dL
Ketones, ur: NEGATIVE mg/dL
Leukocytes,Ua: NEGATIVE
Nitrite: NEGATIVE
Protein, ur: NEGATIVE mg/dL
Specific Gravity, Urine: 1.01 (ref 1.005–1.030)
pH: 6.5 (ref 5.0–8.0)

## 2019-03-05 LAB — COMPREHENSIVE METABOLIC PANEL
ALT: 12 U/L (ref 0–44)
AST: 18 U/L (ref 15–41)
Albumin: 4 g/dL (ref 3.5–5.0)
Alkaline Phosphatase: 69 U/L (ref 38–126)
Anion gap: 9 (ref 5–15)
BUN: 10 mg/dL (ref 6–20)
CO2: 21 mmol/L — ABNORMAL LOW (ref 22–32)
Calcium: 9.2 mg/dL (ref 8.9–10.3)
Chloride: 107 mmol/L (ref 98–111)
Creatinine, Ser: 0.78 mg/dL (ref 0.44–1.00)
GFR calc Af Amer: >60 mL/min (ref >60)
GFR calc non Af Amer: >60 mL/min (ref >60)
Glucose, Bld: 101 mg/dL — ABNORMAL HIGH (ref 70–99)
Potassium: 3.8 mmol/L (ref 3.5–5.1)
Sodium: 137 mmol/L (ref 135–145)
Total Bilirubin: 0.5 mg/dL (ref 0.3–1.2)
Total Protein: 7.2 g/dL (ref 6.5–8.1)

## 2019-03-05 LAB — CBC
HCT: 45.2 % (ref 36.0–46.0)
Hemoglobin: 14.7 g/dL (ref 12.0–15.0)
MCH: 32.6 pg (ref 26.0–34.0)
MCHC: 32.5 g/dL (ref 30.0–36.0)
MCV: 100.2 fL — ABNORMAL HIGH (ref 80.0–100.0)
Platelets: 243 10*3/uL (ref 150–400)
RBC: 4.51 MIL/uL (ref 3.87–5.11)
RDW: 12.4 % (ref 11.5–15.5)
WBC: 7.4 10*3/uL (ref 4.0–10.5)
nRBC: 0 % (ref 0.0–0.2)

## 2019-03-05 LAB — POCT URINALYSIS DIP (DEVICE)
Bilirubin Urine: NEGATIVE
Glucose, UA: NEGATIVE mg/dL
Leukocytes,Ua: NEGATIVE
Nitrite: NEGATIVE
Protein, ur: NEGATIVE mg/dL
Specific Gravity, Urine: 1.025 (ref 1.005–1.030)
Urobilinogen, UA: 1 mg/dL (ref 0.0–1.0)
pH: 7 (ref 5.0–8.0)

## 2019-03-05 LAB — URINALYSIS, MICROSCOPIC (REFLEX): Bacteria, UA: NONE SEEN

## 2019-03-05 LAB — I-STAT BETA HCG BLOOD, ED (MC, WL, AP ONLY): I-stat hCG, quantitative: <5 m[IU]/mL (ref <5)

## 2019-03-05 LAB — POCT PREGNANCY, URINE: Preg Test, Ur: NEGATIVE

## 2019-03-05 LAB — WET PREP, GENITAL
Sperm: NONE SEEN
Trich, Wet Prep: NONE SEEN
Yeast Wet Prep HPF POC: NONE SEEN

## 2019-03-05 LAB — POC URINE PREG, ED: Preg Test, Ur: NEGATIVE

## 2019-03-05 LAB — LIPASE, BLOOD: Lipase: 24 U/L (ref 11–51)

## 2019-03-05 MED ORDER — SODIUM CHLORIDE 0.9 % IV SOLN
1.0000 g | Freq: Once | INTRAVENOUS | Status: AC
  Administered 2019-03-05: 1 g via INTRAVENOUS
  Filled 2019-03-05: qty 10

## 2019-03-05 MED ORDER — IOHEXOL 300 MG/ML  SOLN
100.0000 mL | Freq: Once | INTRAMUSCULAR | Status: AC | PRN
  Administered 2019-03-05: 100 mL via INTRAVENOUS

## 2019-03-05 MED ORDER — ONDANSETRON HCL 4 MG/2ML IJ SOLN
4.0000 mg | Freq: Once | INTRAMUSCULAR | Status: AC
  Administered 2019-03-05: 4 mg via INTRAVENOUS
  Filled 2019-03-05: qty 2

## 2019-03-05 MED ORDER — DOXYCYCLINE HYCLATE 100 MG PO TABS
100.0000 mg | ORAL_TABLET | Freq: Once | ORAL | Status: AC
  Administered 2019-03-05: 100 mg via ORAL
  Filled 2019-03-05: qty 1

## 2019-03-05 MED ORDER — SODIUM CHLORIDE 0.9% FLUSH: 3.0000 mL | Freq: Once | INTRAVENOUS | Status: DC

## 2019-03-05 MED ORDER — SODIUM CHLORIDE 0.9 % IV BOLUS
1000.0000 mL | Freq: Once | INTRAVENOUS | Status: AC
  Administered 2019-03-05: 1000 mL via INTRAVENOUS

## 2019-03-05 MED ORDER — ONDANSETRON 4 MG PO TBDP: 4.0000 mg | ORAL_TABLET | Freq: Three times a day (TID) | ORAL | 0 refills | Status: DC | PRN

## 2019-03-05 MED ORDER — METRONIDAZOLE 500 MG PO TABS: 500.0000 mg | ORAL_TABLET | Freq: Two times a day (BID) | ORAL | 0 refills | Status: DC

## 2019-03-05 MED ORDER — METRONIDAZOLE 500 MG PO TABS
500.0000 mg | ORAL_TABLET | Freq: Once | ORAL | Status: AC
  Administered 2019-03-05: 500 mg via ORAL
  Filled 2019-03-05: qty 1

## 2019-03-05 MED ORDER — DOXYCYCLINE HYCLATE 100 MG PO CAPS: 100.0000 mg | ORAL_CAPSULE | Freq: Two times a day (BID) | ORAL | 0 refills | Status: DC

## 2019-03-05 MED ORDER — MORPHINE SULFATE (PF) 4 MG/ML IV SOLN
4.0000 mg | Freq: Once | INTRAVENOUS | Status: AC
  Administered 2019-03-05: 4 mg via INTRAVENOUS
  Filled 2019-03-05: qty 1

## 2019-03-05 MED ORDER — FENTANYL CITRATE (PF) 100 MCG/2ML IJ SOLN: 50.0000 ug | Freq: Once | INTRAMUSCULAR | Status: DC

## 2019-03-05 NOTE — ED Provider Notes (Signed)
[facility]    CSN: [id number] Arrival date & time: 03/05/19  1635      History   Chief Complaint Chief Complaint  Patient presents with  . Abdominal Pain    HPI Dawn Brock is a 34 y.o. female.   Dawn Brock presents with complaints of pelvic pain and more specifically RLQ abdominal pain. Started yesterday. Has been constant. Had to lay in bed all day yesterday. Aching. No nausea vomiting or diarrhea. No fevers. Maybe mild urinary symptoms, irration with urinating. Otherwise no frequency or back pain. No vaginal discharge, it does feel irritated. LMP was in December. Has an IUD. No URI symptoms. Denies any previous similar. No previous abdominal surgeries.     ROS per HPI, negative if not otherwise mentioned.      Past Medical History:  Diagnosis Date  . Abnormal Pap smear   . Anemia   . Gastritis   . Hx of chlamydia infection   . Vaginal Pap smear, abnormal     Patient Active Problem List   Diagnosis Date Noted  . H/O LEEP 08/21/2016  . Headache 08/21/2016  . Mild tetrahydrocannabinol (THC) abuse 08/21/2016  . History of substance abuse ([place]) 08/21/2016  . Cocaine abuse complicating pregnancy ([place]) 02/22/2013  . Alcohol dependence ([place]) 03/12/2012  . Cannabis dependence ([place]) 03/12/2012  . Executive function deficit 03/11/2012    Class: Acute  . Cigarette smoker 03/10/2012  . Duodenitis with nausea, vomiting, diarrhea and abdominal pain 03/10/2012    Past Surgical History:  Procedure Laterality Date  . HERNIA REPAIR     As an infant  . LEEP      OB History    Gravida  4   Para  4   Term  4   Preterm  0   AB  0   Living  4     SAB  0   TAB  0   Ectopic  0   Multiple  0   Live Births  4            Home Medications    Prior to Admission medications   Medication Sig Start Date End Date Taking? Authorizing Provider  fluconazole (DIFLUCAN) 150 MG tablet Take 1 tablet (150 mg total) by mouth daily.  Repeat in 1 week if needed 11/16/18   Raylene Everts, MD  metroNIDAZOLE (FLAGYL) 500 MG tablet Take 1 tablet (500 mg total) by mouth 2 (two) times daily. 11/16/18   Raylene Everts, MD    Family History Family History  Problem Relation Age of Onset  . Hypertension Mother   . Fibromyalgia Mother   . Cirrhosis Father   . Kidney disease Father   . Asthma Brother   . Birth defects Son        hirschprungs    Social History Social History   Tobacco Use  . Smoking status: Current Every Day Smoker    Packs/day: 0.50    Years: 14.00    Pack years: 7.00    Types: Cigarettes  . Smokeless tobacco: Never Used  Substance Use Topics  . Alcohol use: Yes    Comment: Not since June 2014  . Drug use: Yes    Types: Cocaine, Marijuana    Comment: Patient denies any recent use of Marij[place] and cocaine as of 07/02/12    LAST SMOKED  MARIJUANA-   LAST WEEK LAST USED COCAINE-    2015     Allergies   Tramadol  Review of Systems Review of Systems   Physical Exam Triage Vital Signs ED Triage Vitals  Enc Vitals Group     BP 03/05/19 1655 128/81     Pulse Rate 03/05/19 1655 87     Resp 03/05/19 1655 16     Temp 03/05/19 1655 98.6 F (37 C)     Temp Source 03/05/19 1655 Oral     SpO2 03/05/19 1655 98 %     Weight --      Height --      Head Circumference --      Peak Flow --      Pain Score 03/05/19 1659 8     Pain Loc --      Pain Edu? --      Excl. in [place]? --    No data found.  Updated Vital Signs BP 128/81 (BP Location: Left Arm)   Pulse 87   Temp 98.6 F (37 C) (Oral)   Resp 16   SpO2 98%   Visual Acuity Right Eye Distance:   Left Eye Distance:   Bilateral Distance:    Right Eye Near:   Left Eye Near:    Bilateral Near:     Physical Exam Constitutional:      General: She is not in acute distress.    Appearance: She is well-developed.  Cardiovascular:     Rate and Rhythm: Normal rate.  Pulmonary:     Effort: Pulmonary effort is normal.  Abdominal:      Tenderness: There is abdominal tenderness in the right lower quadrant, suprapubic area and left lower quadrant. There is guarding. There is no rebound.     Comments: RLQ pain on palpation, also with midline and LLQ pain as well. Pain noticeable to be increased with laying flat, unable to hop while standing due to pain; holding on to low abdomen  Skin:    General: Skin is warm and dry.  Neurological:     Mental Status: She is alert and oriented to person, place, and time.      UC Treatments / Results  Labs (all labs ordered are listed, but only abnormal results are displayed) Labs Reviewed  POCT URINALYSIS DIP (DEVICE) - Abnormal; Notable for the following components:      Result Value   Ketones, ur TRACE (*)    Hgb urine dipstick TRACE (*)    All other components within normal limits  POC URINE PREG, ED  POCT PREGNANCY, URINE  CERVICOVAGINAL ANCILLARY ONLY    EKG   Radiology No results found.  Procedures Procedures (including critical care time)  Medications Ordered in UC Medications - No data to display  Initial Impression / Assessment and Plan / UC Course  I have reviewed the triage vital signs and the nursing notes.  Pertinent labs & imaging results that were available during my care of the patient were reviewed by me and considered in my medical decision making (see chart for details).     Afebrile. No tachycardia. ua without acute findings. Deferred pelvic exam as initial exam concerning for possible appendicitis, although PID also considered at this time. Quite significant pelvic pain, R>L. Patient recommended to be seen in ER for further evaluation and management. Patient S/O provides private vehicle transport there now. Patient verbalized understanding and agreeable to plan.   Final Clinical Impressions(s) / UC Diagnoses   Final diagnoses:  Right lower quadrant abdominal pain  Pelvic pain     Discharge Instructions  I do recommend that you go to the  ER for further evaluation of your severe RLQ abdominal pain.     ED Prescriptions    None     PDMP not reviewed this encounter.   Zigmund Gottron, NP 03/05/19 [phone]

## 2019-03-05 NOTE — ED Notes (Signed)
Pt also reports having a vaginal discharge

## 2019-03-05 NOTE — Discharge Instructions (Addendum)
Take the antibiotics as prescribed.  Follow-up with OB/GYN for reevaluation.  If you have any new worsening symptoms please seek reevaluation the emergency department  [facility] [place], [address] [facility] clinic 4PM-7:30PM Monday-Wednesday   Abstain from intercourse until you know your test results. Call the hospital in 3-5 days for your results. IF your gonorrhea and chlamydia tests are negative, you may resume intercourse. IF either test is positive, your partner will need to go to the health department for free treatment. IF your test is positive, both partners need to abstain from intercourse for 10 days after treatment (this includes all forms of intercourse).

## 2019-03-05 NOTE — ED Provider Notes (Signed)
34yo female with RLQ pain, negative for appendicitis on CT. Clinically has PID, pending [place], recheck pain. Given antibiotics, zofran. PO challenge.  Physical Exam  BP (!) 111/99 (BP Location: Right Arm)   Pulse 73   Temp 98.4 F (36.9 C) (Oral)   Resp 16   SpO2 100%   Physical Exam  ED Course/Procedures     Procedures  MDM  Pelvic ultrasound is unremarkable, will p.o. challenge prior to discharge. Patient is tolerating POs, pain has improved.  Patient normally obtains care from the health department however states pathologic services are delayed due to Covid.  Given referral to women's clinic.     Tacy Learn, PA-C 03/05/19 2134    Isla Pence, MD 03/05/19 2235

## 2019-03-05 NOTE — ED Triage Notes (Signed)
Pt here for from Urgent Care for further evaluation of LLQ and RLQ abdominal pain since yesterday. Denies N/V/D.

## 2019-03-05 NOTE — ED Provider Notes (Signed)
[facility] EMERGENCY DEPARTMENT Provider Note   CSN: [id number] Arrival date & time: 03/05/19  1748    History Chief Complaint  Patient presents with  . Abdominal Pain    Dawn Brock is a 34 y.o. female with medical history significant for chlamydia, substance abuse who presents for evaluation abdominal pain.  Patient states she has had lower abdominal pain and pelvic pain which began yesterday.  Originally started as an aching however is now throbbing.  Has had some mild dysuria as well vaginal discharge.  She is sexually active with one female partner.  She uses protection.  She has IUD however is not seen OB GYN in quite some time.  No fever, chills, nausea, vomiting, chest pain, shortness of breath, diarrhea, constipation.  LMP was in December.  Denies prior symptoms.  Denies additional aggravating or alleviating factors.  History pain from patient and past medical records.  No interpreter is used. HPI     Past Medical History:  Diagnosis Date  . Abnormal Pap smear   . Anemia   . Gastritis   . Hx of chlamydia infection   . Vaginal Pap smear, abnormal     Patient Active Problem List   Diagnosis Date Noted  . H/O LEEP 08/21/2016  . Headache 08/21/2016  . Mild tetrahydrocannabinol (THC) abuse 08/21/2016  . History of substance abuse ([place]) 08/21/2016  . Cocaine abuse complicating pregnancy ([place]) 02/22/2013  . Alcohol dependence ([place]) 03/12/2012  . Cannabis dependence ([place]) 03/12/2012  . Executive function deficit 03/11/2012    Class: Acute  . Cigarette smoker 03/10/2012  . Duodenitis with nausea, vomiting, diarrhea and abdominal pain 03/10/2012    Past Surgical History:  Procedure Laterality Date  . HERNIA REPAIR     As an infant  . LEEP       OB History    Gravida  4   Para  4   Term  4   Preterm  0   AB  0   Living  4     SAB  0   TAB  0   Ectopic  0   Multiple  0   Live Births  4           Family History    Problem Relation Age of Onset  . Hypertension Mother   . Fibromyalgia Mother   . Cirrhosis Father   . Kidney disease Father   . Asthma Brother   . Birth defects Son        hirschprungs    Social History   Tobacco Use  . Smoking status: Current Every Day Smoker    Packs/day: 0.50    Years: 14.00    Pack years: 7.00    Types: Cigarettes  . Smokeless tobacco: Never Used  Substance Use Topics  . Alcohol use: Yes    Comment: Not since June 2014  . Drug use: Yes    Types: Cocaine, Marijuana    Comment: Patient denies any recent use of Marijuna and cocaine as of 07/02/12    LAST SMOKED  MARIJUANA-   LAST WEEK LAST USED COCAINE-    2015    Home Medications Prior to Admission medications   Medication Sig Start Date End Date Taking? Authorizing Provider  doxycycline (VIBRAMYCIN) 100 MG capsule Take 1 capsule (100 mg total) by mouth 2 (two) times daily. 03/05/19   Raylei Losurdo A, PA-C  fluconazole (DIFLUCAN) 150 MG tablet Take 1 tablet (150 mg total) by mouth  daily. Repeat in 1 week if needed 11/16/18   Raylene Everts, MD  metroNIDAZOLE (FLAGYL) 500 MG tablet Take 1 tablet (500 mg total) by mouth 2 (two) times daily. 03/05/19   Tashya Alberty A, PA-C  ondansetron (ZOFRAN ODT) 4 MG disintegrating tablet Take 1 tablet (4 mg total) by mouth every 8 (eight) hours as needed for nausea or vomiting. 03/05/19   Addisynn Vassell A, PA-C    Allergies    Tramadol  Review of Systems   Review of Systems  Constitutional: Negative.   HENT: Negative.   Respiratory: Negative.   Cardiovascular: Negative.   Gastrointestinal: Positive for abdominal pain. Negative for abdominal distention, anal bleeding, blood in stool, constipation, diarrhea, nausea, rectal pain and vomiting.  Genitourinary: Positive for dysuria, pelvic pain and vaginal discharge. Negative for decreased urine volume, difficulty urinating, flank pain, frequency, genital sores, hematuria, menstrual problem, urgency, vaginal  bleeding and vaginal pain.  Musculoskeletal: Negative.   Skin: Negative.   Neurological: Negative.   All other systems reviewed and are negative.   Physical Exam Updated Vital Signs BP (!) 111/99 (BP Location: Right Arm)   Pulse 73   Temp 98.4 F (36.9 C) (Oral)   Resp 16   SpO2 100%   Physical Exam Vitals and nursing note reviewed.  Constitutional:      General: She is not in acute distress.    Appearance: She is well-developed. She is not ill-appearing, toxic-appearing or diaphoretic.  HENT:     Head: Normocephalic and atraumatic.     Mouth/Throat:     Mouth: Mucous membranes are moist.  Eyes:     Pupils: Pupils are equal, round, and reactive to light.  Cardiovascular:     Rate and Rhythm: Normal rate.     Heart sounds: Normal heart sounds.  Pulmonary:     Effort: Pulmonary effort is normal. No respiratory distress.     Breath sounds: Normal breath sounds.  Abdominal:     General: Bowel sounds are normal. There is no distension.     Palpations: Abdomen is soft.     Tenderness: There is abdominal tenderness in the right lower quadrant, suprapubic area and left lower quadrant. There is no right CVA tenderness, left CVA tenderness, guarding or rebound. Negative signs include Murphy's sign, psoas sign and obturator sign.     Hernia: No hernia is present.     Comments: Generalized tenderness to lower abdomen however worse at right lower quadrant.  Negative psoas, obturator sign.  Genitourinary:    Comments: Normal appearing external female genitalia without rashes or lesions, normal vaginal epithelium. Normal appearing cervix with yellow discharge. No cervical petechiae. Cervical os is closed. There is no bleeding noted at the os. No odor. Bimanual: Positive CMT, Mild bilateral adnexal tenderness however worse to Right.  No palpable adnexal masses. Uterus midline and not fixed. Rectovaginal exam was deferred.  No cystocele or rectocele noted. No pelvic lymphadenopathy noted. Wet  prep was obtained.  Cultures for gonorrhea and chlamydia collected. Exam performed with chaperone in room. Musculoskeletal:        General: Normal range of motion.     Cervical back: Normal range of motion.  Skin:    General: Skin is warm and dry.     Comments: Brisk cap refill  Neurological:     Mental Status: She is alert.    ED Results / Procedures / Treatments   Labs (all labs ordered are listed, but only abnormal results are displayed) Labs Reviewed  WET PREP, GENITAL - Abnormal; Notable for the following components:      Result Value   Clue Cells Wet Prep HPF POC PRESENT (*)    WBC, Wet Prep HPF POC FEW (*)    All other components within normal limits  COMPREHENSIVE METABOLIC PANEL - Abnormal; Notable for the following components:   CO2 21 (*)    Glucose, Bld 101 (*)    All other components within normal limits  CBC - Abnormal; Notable for the following components:   MCV 100.2 (*)    All other components within normal limits  URINALYSIS, ROUTINE W REFLEX MICROSCOPIC - Abnormal; Notable for the following components:   Hgb urine dipstick TRACE (*)    All other components within normal limits  LIPASE, BLOOD  URINALYSIS, MICROSCOPIC (REFLEX)  RPR  HIV ANTIBODY (ROUTINE TESTING W REFLEX)  I-STAT BETA HCG BLOOD, ED ([facility], [facility], [facility] ONLY)  GC/CHLAMYDIA PROBE AMP ([place]) NOT AT [facility]    EKG None  Radiology CT Abdomen Pelvis W Contrast  Result Date: 03/05/2019 CLINICAL DATA:  34 year old female with history of right lower quadrant abdominal pain. Suspected acute appendicitis. EXAM: CT ABDOMEN AND PELVIS WITH CONTRAST TECHNIQUE: Multidetector CT imaging of the abdomen and pelvis was performed using the standard protocol following bolus administration of intravenous contrast. CONTRAST:  152mL OMNIPAQUE IOHEXOL 300 MG/ML  SOLN COMPARISON:  CT of abdomen and pelvis 03/09/2012. FINDINGS: Lower chest: Unremarkable. Hepatobiliary: No suspicious cystic or solid hepatic lesions. No  intra or extrahepatic biliary ductal dilatation. Gallbladder is normal in appearance. Pancreas: No pancreatic mass. No pancreatic ductal dilatation. No pancreatic or peripancreatic fluid collections or inflammatory changes. Spleen: Unremarkable. Adrenals/Urinary Tract: Right kidney and bilateral adrenal glands are normal in appearance. Subcentimeter low-attenuation lesions in the left kidney, too small to characterize, but statistically likely to represent tiny cysts. Several nonobstructive calculi are noted within the left renal collecting system measuring 2-3 mm in size. No hydroureteronephrosis. Urinary bladder is normal in appearance. Bilateral adrenal glands are normal in appearance. Stomach/Bowel: Normal appearance of the stomach. No pathologic dilatation of small bowel or colon. Normal appendix. Vascular/Lymphatic: No significant atherosclerotic disease, aneurysm or dissection noted in the abdominal or pelvic vasculature. No lymphadenopathy noted in the abdomen or pelvis. Reproductive: IUD present in the uterus. Uterus and right ovary are otherwise unremarkable. 2.3 cm rim enhancing lesion in the left adnexa, presumably a corpus luteum cyst. Other: No significant volume of ascites.  No pneumoperitoneum. Musculoskeletal: There are no aggressive appearing lytic or blastic lesions noted in the visualized portions of the skeleton. IMPRESSION: 1. Normal appendix. No acute findings are noted in the abdomen or pelvis to account for the patient's symptoms. 2. Multiple nonobstructive calculi are present within the left renal collecting system. No ureteral stones or findings of urinary tract obstruction. 3. Incidental findings, as above. Electronically Signed   By: Vinnie Langton M.D.   On: 03/05/2019 20:05    Procedures Procedures (including critical care time)  Medications Ordered in ED Medications  sodium chloride flush (NS) 0.9 % injection 3 mL (has no administration in time range)  sodium chloride 0.9 %  bolus 1,000 mL (1,000 mLs Intravenous New Bag/Given 03/05/19 1927)  ondansetron (ZOFRAN) injection 4 mg (4 mg Intravenous Given 03/05/19 2030)  iohexol (OMNIPAQUE) 300 MG/ML solution 100 mL (100 mLs Intravenous Contrast Given 03/05/19 1949)  morphine 4 MG/ML injection 4 mg (4 mg Intravenous Given 03/05/19 2030)  cefTRIAXone (ROCEPHIN) 1 g in sodium chloride 0.9 % 100 mL IVPB (  1 g Intravenous New Bag/Given 03/05/19 2035)  metroNIDAZOLE (FLAGYL) tablet 500 mg (500 mg Oral Given 03/05/19 2034)  doxycycline (VIBRA-TABS) tablet 100 mg (100 mg Oral Given 03/05/19 2034)    ED Course  I have reviewed the triage vital signs and the nursing notes.  Pertinent labs & imaging results that were available during my care of the patient were reviewed by me and considered in my medical decision making (see chart for details).   34 year old female sent here from urgent care for abdominal pain and pelvic pain.  Generalized abdominal pain however worse to right lower quadrant.  Symptoms began yesterday.  Has had some dysuria as well as vaginal discharge.  Has history of chlamydia however patient denies concerns for STDs.  Currently concern for possible appendicitis at urgent care and sent here for CT. not have pelvic exam at urgent care.  She is afebrile, nonseptic, non-ill-appearing.  She has no rebound or guarding on her abdominal exam.  She does not appear systemically ill.  Labs were obtained from triage personally reviewed. Plan on CT scan and pelvic exam and reevaluate.  CBC without leukocytosis Metabolic panel without electrolyte, renal or liver abnormality Lipase 24 Pregnancy test negative  CTAP without acute findings.  GU exam with cervical motion tenderness with some mild bilateral adnexal tenderness.  Yellow discharge at cervical vault.  Will treat for PID. Will also obtain US to r/o torsion given worse pain at RLQ with negative CT for appendicitis.  Care transferred to [facility], PA-C who will follow up on [place]  and reassess pain. If pain controled and tolerating PO intake with neg [place] likely dc home with abx for PID (Already sent) and close ObGyn follow up. If pain uncontrolled, unable to tolerate PO intake or concerning findings on [place] disposition per oncoming provider.    MDM Rules/Calculators/A&P                       Final Clinical Impression(s) / ED Diagnoses Final diagnoses:  PID (acute pelvic inflammatory disease)  Bacterial vaginosis    Rx / DC Orders ED Discharge Orders         Ordered    metroNIDAZOLE (FLAGYL) 500 MG tablet  2 times daily     03/05/19 2021    doxycycline (VIBRAMYCIN) 100 MG capsule  2 times daily     03/05/19 2021    ondansetron (ZOFRAN ODT) 4 MG disintegrating tablet  Every 8 hours PRN     03/05/19 2021           Jericka Kadar A, PA-C 03/05/19 2113    Fredia Sorrow, MD 03/07/19 1541

## 2019-03-05 NOTE — ED Notes (Signed)
Patient is being discharged from the Urgent [place] and sent to the Emergency Department via personal vehicle by self. Per provider Augusto Gamble, patient is stable but in need of higher level of care due to lower abdominal pain. Patient is aware and verbalizes understanding of plan of care.   Vitals:   03/05/19 1655  BP: 128/81  Pulse: 87  Resp: 16  Temp: 98.6 F (37 C)  SpO2: 98%

## 2019-03-05 NOTE — ED Notes (Signed)
Pt tolerating PO fluids

## 2019-03-05 NOTE — Discharge Instructions (Signed)
I do recommend that you go to the ER for further evaluation of your severe RLQ abdominal pain.

## 2019-03-05 NOTE — ED Triage Notes (Signed)
Pt presents with constant lower pelvic pain since yesterday.

## 2019-03-05 NOTE — ED Notes (Signed)
Patient verbalizes understanding of discharge instructions. Opportunity for questioning and answers were provided. Armband removed by staff, pt discharged from ED ambulatory.   

## 2019-03-06 LAB — HIV ANTIBODY (ROUTINE TESTING W REFLEX): HIV Screen 4th Generation wRfx: NONREACTIVE

## 2019-03-06 LAB — RPR: RPR Ser Ql: NONREACTIVE

## 2019-03-07 LAB — CERVICOVAGINAL ANCILLARY ONLY
Bacterial vaginitis: POSITIVE — AB
Candida vaginitis: NEGATIVE
Chlamydia: NEGATIVE
Neisseria Gonorrhea: NEGATIVE
Trichomonas: NEGATIVE

## 2019-03-07 LAB — GC/CHLAMYDIA PROBE AMP (~~LOC~~) NOT AT ARMC
Chlamydia: NEGATIVE
Neisseria Gonorrhea: NEGATIVE

## 2019-05-05 ENCOUNTER — Emergency Department (HOSPITAL_COMMUNITY)
Admission: EM | Admit: 2019-05-05 | Discharge: 2019-05-05 | Disposition: A | Discharge status: Home / Self Care | Payer: Medicaid Other | Attending: Emergency Medicine | Admitting: Emergency Medicine

## 2019-05-05 ENCOUNTER — Other Ambulatory Visit: Payer: Self-pay

## 2019-05-05 DIAGNOSIS — Z20822 Contact with and (suspected) exposure to covid-19: Secondary | ICD-10-CM | POA: Insufficient documentation

## 2019-05-05 DIAGNOSIS — Z5321 Procedure and treatment not carried out due to patient leaving prior to being seen by health care provider: Secondary | ICD-10-CM | POA: Insufficient documentation

## 2019-05-05 DIAGNOSIS — R0602 Shortness of breath: Secondary | ICD-10-CM | POA: Diagnosis not present

## 2019-05-05 DIAGNOSIS — R519 Headache, unspecified: Secondary | ICD-10-CM | POA: Insufficient documentation

## 2019-05-05 DIAGNOSIS — R05 Cough: Secondary | ICD-10-CM | POA: Insufficient documentation

## 2019-05-05 LAB — POC SARS CORONAVIRUS 2 AG -  ED: SARS Coronavirus 2 Ag: NEGATIVE

## 2019-05-05 LAB — BASIC METABOLIC PANEL
Anion gap: 10 (ref 5–15)
BUN: 5 mg/dL — ABNORMAL LOW (ref 6–20)
CO2: 20 mmol/L — ABNORMAL LOW (ref 22–32)
Calcium: 8.7 mg/dL — ABNORMAL LOW (ref 8.9–10.3)
Chloride: 102 mmol/L (ref 98–111)
Creatinine, Ser: 0.66 mg/dL (ref 0.44–1.00)
GFR calc Af Amer: >60 mL/min (ref >60)
GFR calc non Af Amer: >60 mL/min (ref >60)
Glucose, Bld: 99 mg/dL (ref 70–99)
Potassium: 3.8 mmol/L (ref 3.5–5.1)
Sodium: 132 mmol/L — ABNORMAL LOW (ref 135–145)

## 2019-05-05 LAB — CBC
HCT: 43.5 % (ref 36.0–46.0)
Hemoglobin: 14 g/dL (ref 12.0–15.0)
MCH: 31.8 pg (ref 26.0–34.0)
MCHC: 32.2 g/dL (ref 30.0–36.0)
MCV: 98.9 fL (ref 80.0–100.0)
Platelets: 250 10*3/uL (ref 150–400)
RBC: 4.4 MIL/uL (ref 3.87–5.11)
RDW: 12.2 % (ref 11.5–15.5)
WBC: 7.9 10*3/uL (ref 4.0–10.5)
nRBC: 0 % (ref 0.0–0.2)

## 2019-05-05 LAB — I-STAT BETA HCG BLOOD, ED (MC, WL, AP ONLY): I-stat hCG, quantitative: <5 m[IU]/mL (ref <5)

## 2019-05-05 NOTE — ED Notes (Signed)
RN Otila Kluver informed pt POC covid test neg

## 2019-05-05 NOTE — ED Triage Notes (Signed)
Pt c/o cough, HA, SOB and sore throat. Patient sounds audibly congested.

## 2019-05-05 NOTE — ED Notes (Signed)
Pt stated that she could not wait any longer and would rather go home and try to rest.  Pt informed that we could not move he back any faster.  Pt chose to leave.

## 2019-05-07 ENCOUNTER — Encounter (HOSPITAL_COMMUNITY): Payer: Self-pay

## 2019-05-07 ENCOUNTER — Other Ambulatory Visit: Payer: Self-pay

## 2019-05-07 ENCOUNTER — Ambulatory Visit (HOSPITAL_COMMUNITY)
Admission: EM | Admit: 2019-05-07 | Discharge: 2019-05-07 | Disposition: A | Discharge status: Home / Self Care | Payer: Medicaid Other | Attending: Urgent Care | Admitting: Urgent Care

## 2019-05-07 DIAGNOSIS — R05 Cough: Secondary | ICD-10-CM

## 2019-05-07 DIAGNOSIS — J00 Acute nasopharyngitis [common cold]: Secondary | ICD-10-CM

## 2019-05-07 DIAGNOSIS — H938X3 Other specified disorders of ear, bilateral: Secondary | ICD-10-CM

## 2019-05-07 DIAGNOSIS — F17200 Nicotine dependence, unspecified, uncomplicated: Secondary | ICD-10-CM

## 2019-05-07 DIAGNOSIS — R058 Other specified cough: Secondary | ICD-10-CM

## 2019-05-07 DIAGNOSIS — R0981 Nasal congestion: Secondary | ICD-10-CM

## 2019-05-07 MED ORDER — PROMETHAZINE-DM 6.25-15 MG/5ML PO SYRP: 5.0000 mL | ORAL_SOLUTION | Freq: Every evening | ORAL | 0 refills | Status: DC | PRN

## 2019-05-07 MED ORDER — LEVOCETIRIZINE DIHYDROCHLORIDE 5 MG PO TABS: 5.0000 mg | ORAL_TABLET | Freq: Every evening | ORAL | 0 refills | Status: DC

## 2019-05-07 MED ORDER — BENZONATATE 100 MG PO CAPS: 100.0000 mg | ORAL_CAPSULE | Freq: Three times a day (TID) | ORAL | 0 refills | Status: DC | PRN

## 2019-05-07 MED ORDER — PREDNISONE 20 MG PO TABS: ORAL_TABLET | ORAL | 0 refills | Status: DC

## 2019-05-07 NOTE — ED Provider Notes (Signed)
[facility]   MRN: [id number] DOB: [birth year]  Subjective:   Dawn Brock is a 34 y.o. female presenting for 1 week history of persistent malaise, ROS below. Has tried Tussin and otc meds with minimal relief. Smokes 1/2-1ppd. Went to the ER on 05/05/2019 but left without being seen.   Denies taking chronic medications.    Allergies  Allergen Reactions  . Tramadol Nausea And Vomiting    Past Medical History:  Diagnosis Date  . Abnormal Pap smear   . Anemia   . Gastritis   . Hx of chlamydia infection   . Vaginal Pap smear, abnormal      Past Surgical History:  Procedure Laterality Date  . HERNIA REPAIR     As an infant  . LEEP      Family History  Problem Relation Age of Onset  . Hypertension Mother   . Fibromyalgia Mother   . Cirrhosis Father   . Kidney disease Father   . Asthma Brother   . Birth defects Son        hirschprungs    Social History   Tobacco Use  . Smoking status: Current Every Day Smoker    Packs/day: 0.50    Years: 14.00    Pack years: 7.00    Types: Cigarettes  . Smokeless tobacco: Never Used  Substance Use Topics  . Alcohol use: Yes    Comment: Not since June 2014  . Drug use: Yes    Types: Cocaine, Marijuana    Comment: Patient denies any recent use of Marijuna and cocaine as of 07/02/12    LAST SMOKED  MARIJUANA-   LAST WEEK LAST USED COCAINE-    2015    Review of Systems  Constitutional: Positive for chills, fever and malaise/fatigue.  HENT: Positive for congestion. Negative for ear discharge, ear pain, sinus pain, sore throat and tinnitus.        Ear popping bilaterally.   Eyes: Negative for pain.  Respiratory: Positive for cough. Negative for hemoptysis, shortness of breath and wheezing.        No difficulty breathing but taking a deep breath elicits a cough.  Cardiovascular: Positive for chest pain (from coughing).  Gastrointestinal: Negative for abdominal pain, diarrhea, nausea and vomiting.  Genitourinary:  Negative for dysuria, flank pain and hematuria.  Musculoskeletal: Positive for back pain (low back, admits lack of hydration). Negative for myalgias.  Skin: Negative for rash.  Neurological: Negative for dizziness, weakness and headaches.  Psychiatric/Behavioral: Negative for depression and substance abuse.    Objective:   Vitals: BP (!) 142/77 (BP Location: Right Arm)   Pulse 68   Temp 98.6 F (37 C) (Oral)   Resp 18   Wt 195 lb (88.5 kg)   LMP 04/16/2019   SpO2 100%   BMI 31.28 kg/m   Physical Exam Constitutional:      General: She is not in acute distress.    Appearance: Normal appearance. She is well-developed. She is ill-appearing. She is not toxic-appearing or diaphoretic.  HENT:     Head: Normocephalic and atraumatic.     Right Ear: Tympanic membrane and ear canal normal. No drainage or tenderness. No middle ear effusion. Tympanic membrane is not erythematous.     Left Ear: Ear canal normal. No drainage or tenderness.  No middle ear effusion. Tympanic membrane is not erythematous.     Ears:     Comments: Injected TM left side.    Nose: Congestion and rhinorrhea present.  Mouth/Throat:     Mouth: Mucous membranes are moist. No oral lesions.     Pharynx: No pharyngeal swelling, oropharyngeal exudate, posterior oropharyngeal erythema or uvula swelling.     Tonsils: No tonsillar exudate or tonsillar abscesses.     Comments: Significant post-nasal drainage overlying pharynx. Eyes:     General: No scleral icterus.       Right eye: No discharge.        Left eye: No discharge.     Extraocular Movements: Extraocular movements intact.     Right eye: Normal extraocular motion.     Left eye: Normal extraocular motion.     Conjunctiva/sclera: Conjunctivae normal.     Pupils: Pupils are equal, round, and reactive to light.  Cardiovascular:     Rate and Rhythm: Normal rate and regular rhythm.     Pulses: Normal pulses.     Heart sounds: Normal heart sounds. No murmur. No  friction rub. No gallop.   Pulmonary:     Effort: Pulmonary effort is normal. No respiratory distress.     Breath sounds: Normal breath sounds. No stridor. No wheezing, rhonchi or rales.  Musculoskeletal:     Cervical back: Normal range of motion and neck supple.  Lymphadenopathy:     Cervical: No cervical adenopathy.  Skin:    General: Skin is warm and dry.     Findings: No rash.  Neurological:     General: No focal deficit present.     Mental Status: She is alert and oriented to person, place, and time.  Psychiatric:        Mood and Affect: Mood normal.        Behavior: Behavior normal.        Thought Content: Thought content normal.        Judgment: Judgment normal.      Assessment and Plan :   PDMP not reviewed this encounter.  1. Acute rhinitis   2. Sinus congestion   3. Productive cough   4. Smoker   5. Ear popping, bilateral     Start prednisone course for ongoing rhinitis. Labs from ER visit reviewed with patient as she left without being seen.  COVID 19 testing was negative. Use supportive care otherwise.  Encourage patient to consider smoking cessation.  If no improvement in 3 to 4 days, consider amoxicillin to address sinusitis.  Counseled patient on potential for adverse effects with medications prescribed/recommended today, ER and return-to-clinic precautions discussed, patient verbalized understanding.    Jaynee Eagles, [place] 05/07/19 1656

## 2019-05-07 NOTE — Discharge Instructions (Signed)
We will manage this for rhinitis. For sore throat or cough try using a honey-based tea. Use 3 teaspoons of honey with juice squeezed from half lemon. Place shaved pieces of ginger into 1/2-1 cup of water and warm over stove top. Then mix the ingredients and repeat every 4 hours as needed. Please take Tylenol 500mg -650mg  once every 6 hours for fevers, aches and pains. Hydrate very well with at least 2 liters (64 ounces) of water. Eat light meals such as soups (chicken and noodles, chicken wild rice, vegetable).  Do not eat any foods that you are allergic to.  Start an antihistamine like Xyzal for postnasal drainage, sinus congestion.

## 2019-05-07 NOTE — ED Triage Notes (Signed)
Pt states she has cough for a 1 week. Pt states she ER to the ER on Friday. Pt states she left. Pt states she did a Covid test and they told her she was negative for Covid this was on Friday.

## 2019-05-09 ENCOUNTER — Other Ambulatory Visit: Payer: Self-pay

## 2019-05-09 ENCOUNTER — Telehealth (INDEPENDENT_AMBULATORY_CARE_PROVIDER_SITE_OTHER): Payer: Medicaid Other | Admitting: Nurse Practitioner

## 2019-05-09 ENCOUNTER — Encounter: Payer: Self-pay | Admitting: Nurse Practitioner

## 2019-05-09 VITALS — Wt 195.0 lb

## 2019-05-09 DIAGNOSIS — J0110 Acute frontal sinusitis, unspecified: Secondary | ICD-10-CM | POA: Diagnosis not present

## 2019-05-09 MED ORDER — AZITHROMYCIN 250 MG PO TABS: ORAL_TABLET | ORAL | 0 refills | Status: AC

## 2019-05-09 NOTE — Progress Notes (Signed)
Virtual Visit via Video   This visit type was conducted due to national recommendations for restrictions regarding the COVID-19 Pandemic (e.g. social distancing) in an effort to limit this patient's exposure and mitigate transmission in our community.  Due to her co-morbid illnesses, this patient is at least at moderate risk for complications without adequate follow up.  This format is felt to be most appropriate for this patient at this time.  All issues noted in this document were discussed and addressed.  A limited physical exam was performed with this format.    This visit type was conducted due to national recommendations for restrictions regarding the COVID-19 Pandemic (e.g. social distancing) in an effort to limit this patient's exposure and mitigate transmission in our community.  Patients identity confirmed using two different identifiers.  This format is felt to be most appropriate for this patient at this time.  All issues noted in this document were discussed and addressed.  No physical exam was performed (except for noted visual exam findings with Video Visits).    Date:  05/09/2019   ID:  Dawn Brock, DOB [birth year], MRN [id number]  Patient Location:  Home - spoke with Rolland Bimler  Provider location:   Office    Chief Complaint:  Cold symptoms  History of Present Illness:    Dawn Brock is a 34 y.o. female who presents via video conferencing for a telehealth visit today.    The patient does have symptoms concerning for COVID-19 infection (fever, chills, cough, or new shortness of breath).   She was seen at the ER on 05/07/2019 for rhinitis. Coughing and rhinitis nose. She was negative for covid. She has not had the covid vaccine.  Continues to be fatigued and laying around, she is having sweating.  She had been having the symptoms for 7 days. She has not taken any other medications.     Past Medical History:  Diagnosis Date  . Abnormal Pap smear    . Anemia   . Gastritis   . Hx of chlamydia infection   . Vaginal Pap smear, abnormal    Past Surgical History:  Procedure Laterality Date  . HERNIA REPAIR     As an infant  . LEEP       Current Meds  Medication Sig  . levocetirizine (XYZAL) 5 MG tablet Take 1 tablet (5 mg total) by mouth every evening.  . predniSONE (DELTASONE) 20 MG tablet Take 2 tablets daily with breakfast.     Allergies:   Tramadol   Social History   Tobacco Use  . Smoking status: Current Every Day Smoker    Packs/day: 0.50    Years: 14.00    Pack years: 7.00    Types: Cigarettes  . Smokeless tobacco: Never Used  Substance Use Topics  . Alcohol use: Yes    Comment: Not since June 2014  . Drug use: Yes    Types: Cocaine, Marijuana    Comment: Patient denies any recent use of Marijuna and cocaine as of 07/02/12    LAST SMOKED  MARIJUANA-   LAST WEEK LAST USED COCAINE-    2015     Family Hx: The patient's family history includes Asthma in her brother; Birth defects in her son; Cirrhosis in her father; Fibromyalgia in her mother; Hypertension in her mother; Kidney disease in her father.  ROS:   Please see the history of present illness.    Review of Systems  Constitutional: Positive for chills and malaise/fatigue.  Negative for fever.  HENT: Positive for sinus pain.   Eyes: Negative.   Respiratory: Positive for cough.   Cardiovascular: Negative.   Psychiatric/Behavioral: Negative.     All other systems reviewed and are negative.   Labs/Other Tests and Data Reviewed:    Recent Labs: 03/05/2019: ALT 12 05/05/2019: BUN 5; Creatinine, Ser 0.66; Hemoglobin 14.0; Platelets 250; Potassium 3.8; Sodium 132   Recent Lipid Panel No results found for: CHOL, TRIG, HDL, CHOLHDL, LDLCALC, LDLDIRECT  Wt Readings from Last 3 Encounters:  05/09/19 195 lb (88.5 kg)  05/07/19 195 lb (88.5 kg)  09/05/18 199 lb 3.2 oz (90.4 kg)     Exam:    Vital Signs:  Wt 195 lb (88.5 kg)   LMP 04/16/2019   BMI  31.28 kg/m     Physical Exam  Constitutional: She is oriented to person, place, and time and well-developed, well-nourished, and in no distress. No distress.  Appears ill  Pulmonary/Chest: Effort normal. No respiratory distress.  Neurological: She is alert and oriented to person, place, and time.  Psychiatric: Mood, memory, affect and judgment normal.    ASSESSMENT & PLAN:    1. Acute non-recurrent frontal sinusitis  She continues to feel ill, continues to take the prednisone  I will send a Rx for azithromycin she continues to have chills and is not feeling better  She has a work note to return on Monday May 3rd. - azithromycin (ZITHROMAX) 250 MG tablet; Take 2 tablets (500 mg) on  Day 1,  followed by 1 tablet (250 mg) once daily on Days 2 through 5.  Dispense: 6 each; Refill: 0    COVID-19 Education: The signs and symptoms of COVID-19 were discussed with the patient and how to seek care for testing (follow up with PCP or arrange E-visit).  The importance of social distancing was discussed today.  Patient Risk:   After full review of this patients clinical status, I feel that they are at least moderate risk at this time.  Time:   Today, I have spent 11 minutes/ seconds with the patient with telehealth technology discussing above diagnoses.     Medication Adjustments/Labs and Tests Ordered: Current medicines are reviewed at length with the patient today.  Concerns regarding medicines are outlined above.   Tests Ordered: No orders of the defined types were placed in this encounter.   Medication Changes: Meds ordered this encounter  Medications  . azithromycin (ZITHROMAX) 250 MG tablet    Sig: Take 2 tablets (500 mg) on  Day 1,  followed by 1 tablet (250 mg) once daily on Days 2 through 5.    Dispense:  6 each    Refill:  0    Disposition:  Follow up prn  Signed, Minette Brine, FNP

## 2019-09-25 DIAGNOSIS — Z03818 Encounter for observation for suspected exposure to other biological agents ruled out: Secondary | ICD-10-CM | POA: Diagnosis not present

## 2019-11-07 DIAGNOSIS — Z03818 Encounter for observation for suspected exposure to other biological agents ruled out: Secondary | ICD-10-CM | POA: Diagnosis not present

## 2019-11-13 ENCOUNTER — Emergency Department (HOSPITAL_COMMUNITY)
Admission: EM | Admit: 2019-11-13 | Discharge: 2019-11-13 | Disposition: A | Discharge status: Home / Self Care | Payer: Medicaid Other | Attending: Emergency Medicine | Admitting: Emergency Medicine

## 2019-11-13 ENCOUNTER — Other Ambulatory Visit: Payer: Self-pay

## 2019-11-13 ENCOUNTER — Encounter (HOSPITAL_COMMUNITY): Payer: Self-pay | Admitting: Emergency Medicine

## 2019-11-13 DIAGNOSIS — Z5321 Procedure and treatment not carried out due to patient leaving prior to being seen by health care provider: Secondary | ICD-10-CM | POA: Diagnosis not present

## 2019-11-13 DIAGNOSIS — L0291 Cutaneous abscess, unspecified: Secondary | ICD-10-CM | POA: Diagnosis present

## 2019-11-13 NOTE — ED Notes (Signed)
Pt stated she will just wait to an UC or back here tomorrow, she cannot wait

## 2020-01-16 ENCOUNTER — Ambulatory Visit (HOSPITAL_COMMUNITY)
Admission: EM | Admit: 2020-01-16 | Discharge: 2020-01-16 | Disposition: A | Discharge status: Home / Self Care | Payer: Medicaid Other | Attending: Urgent Care | Admitting: Urgent Care

## 2020-01-16 ENCOUNTER — Other Ambulatory Visit: Payer: Self-pay

## 2020-01-16 DIAGNOSIS — R0981 Nasal congestion: Secondary | ICD-10-CM | POA: Diagnosis present

## 2020-01-16 DIAGNOSIS — J069 Acute upper respiratory infection, unspecified: Secondary | ICD-10-CM

## 2020-01-16 DIAGNOSIS — R070 Pain in throat: Secondary | ICD-10-CM

## 2020-01-16 DIAGNOSIS — U071 COVID-19: Secondary | ICD-10-CM | POA: Diagnosis not present

## 2020-01-16 MED ORDER — CETIRIZINE HCL 10 MG PO TABS: 10.0000 mg | ORAL_TABLET | Freq: Every day | ORAL | 0 refills | Status: DC

## 2020-01-16 MED ORDER — PSEUDOEPHEDRINE HCL 60 MG PO TABS: 60.0000 mg | ORAL_TABLET | Freq: Three times a day (TID) | ORAL | 0 refills | Status: DC | PRN

## 2020-01-16 MED ORDER — PROMETHAZINE-DM 6.25-15 MG/5ML PO SYRP: 5.0000 mL | ORAL_SOLUTION | Freq: Every evening | ORAL | 0 refills | Status: DC | PRN

## 2020-01-16 MED ORDER — BENZONATATE 100 MG PO CAPS: 100.0000 mg | ORAL_CAPSULE | Freq: Three times a day (TID) | ORAL | 0 refills | Status: DC | PRN

## 2020-01-16 NOTE — ED Triage Notes (Signed)
Here for cold sx onset yest associated w/sore throat, cough  Denies fever, v/n/d  Reports best friend's mother tested positive for COVID  Pt is A&O x4... NAD.Marland Kitchen. ambulatory

## 2020-01-16 NOTE — ED Provider Notes (Signed)
[facility]   MRN: [id number] DOB: 06/20/1985  Subjective:   Dawn Brock is a 35 y.o. female presenting for 1 day history of acute onset malaise, chills, sore throat, cough.  Denies fever, chest pain, shortness of breath.  Denies history of asthma.  Patient is a smoker.  She is not Covid vaccinated.  She did have multiple sick contacts, 3 of them tested positive for Covid.  Patient works in [employer].  No current facility-administered medications for this encounter.  Current Outpatient Medications:  .  levocetirizine (XYZAL) 5 MG tablet, Take 1 tablet (5 mg total) by mouth every evening., Disp: 90 tablet, Rfl: 0 .  predniSONE (DELTASONE) 20 MG tablet, Take 2 tablets daily with breakfast., Disp: 10 tablet, Rfl: 0 .  promethazine-dextromethorphan (PROMETHAZINE-DM) 6.25-15 MG/5ML syrup, Take 5 mLs by mouth at bedtime as needed for cough. (Patient not taking: Reported on 05/09/2019), Disp: 100 mL, Rfl: 0   Allergies  Allergen Reactions  . Tramadol Nausea And Vomiting    Past Medical History:  Diagnosis Date  . Abnormal Pap smear   . Anemia   . Gastritis   . Hx of chlamydia infection   . Vaginal Pap smear, abnormal      Past Surgical History:  Procedure Laterality Date  . HERNIA REPAIR     As an infant  . LEEP      Family History  Problem Relation Age of Onset  . Hypertension Mother   . Fibromyalgia Mother   . Cirrhosis Father   . Kidney disease Father   . Asthma Brother   . Birth defects Son        hirschprungs    Social History   Tobacco Use  . Smoking status: Current Every Day Smoker    Packs/day: 0.50    Years: 14.00    Pack years: 7.00    Types: Cigarettes  . Smokeless tobacco: Never Used  Vaping Use  . Vaping Use: Never used  Substance Use Topics  . Alcohol use: Yes    Comment: Not since June 2014  . Drug use: Yes    Types: Cocaine, Marijuana    Comment: Patient denies any recent use of Marijuna and cocaine as  of 07/02/12    LAST SMOKED  MARIJUANA-   LAST WEEK LAST USED COCAINE-    2015    ROS   Objective:   Vitals: BP (!) 144/88 (BP Location: Left Arm)   Pulse 87   Temp 98.7 F (37.1 C) (Oral)   Resp (!) 22   LMP 01/16/2020   SpO2 98%   Physical Exam Constitutional:      General: She is not in acute distress.    Appearance: Normal appearance. She is well-developed. She is not ill-appearing, toxic-appearing or diaphoretic.  HENT:     Head: Normocephalic and atraumatic.     Nose: Nose normal.     Mouth/Throat:     Mouth: Mucous membranes are moist.  Eyes:     Extraocular Movements: Extraocular movements intact.     Pupils: Pupils are equal, round, and reactive to light.  Cardiovascular:     Rate and Rhythm: Normal rate and regular rhythm.     Pulses: Normal pulses.     Heart sounds: Normal heart sounds. No murmur heard. No friction rub. No gallop.   Pulmonary:     Effort: Pulmonary effort is normal. No respiratory distress.     Breath sounds: Normal breath sounds. No stridor.  No wheezing, rhonchi or rales.  Skin:    General: Skin is warm and dry.     Findings: No rash.  Neurological:     Mental Status: She is alert and oriented to person, place, and time.  Psychiatric:        Mood and Affect: Mood normal.        Behavior: Behavior normal.        Thought Content: Thought content normal.      Assessment and Plan :   PDMP not reviewed this encounter.  1. Viral URI with cough   2. Throat pain   3. Nasal congestion     Will manage for viral illness such as viral URI, viral syndrome, viral rhinitis, COVID-19. Counseled patient on nature of COVID-19 including modes of transmission, diagnostic testing, management and supportive care.  Offered scripts for symptomatic relief. COVID 19 testing is pending. Counseled patient on potential for adverse effects with medications prescribed/recommended today, ER and return-to-clinic precautions discussed, patient verbalized  understanding.     Jaynee Eagles, [place] 01/16/20 1948

## 2020-01-16 NOTE — Discharge Instructions (Addendum)

## 2020-01-17 LAB — SARS CORONAVIRUS 2 (TAT 6-24 HRS): SARS Coronavirus 2: POSITIVE — AB

## 2020-05-28 DIAGNOSIS — Z01419 Encounter for gynecological examination (general) (routine) without abnormal findings: Secondary | ICD-10-CM | POA: Diagnosis not present

## 2020-05-28 DIAGNOSIS — Z3009 Encounter for other general counseling and advice on contraception: Secondary | ICD-10-CM | POA: Diagnosis not present

## 2020-09-09 ENCOUNTER — Other Ambulatory Visit: Payer: Self-pay

## 2020-09-09 ENCOUNTER — Emergency Department (HOSPITAL_COMMUNITY)
Admission: EM | Admit: 2020-09-09 | Discharge: 2020-09-10 | Disposition: A | Discharge status: Home / Self Care | Payer: Medicaid Other | Attending: Emergency Medicine | Admitting: Emergency Medicine

## 2020-09-09 ENCOUNTER — Emergency Department (HOSPITAL_COMMUNITY): Payer: Medicaid Other

## 2020-09-09 ENCOUNTER — Encounter (HOSPITAL_COMMUNITY): Payer: Self-pay | Admitting: Emergency Medicine

## 2020-09-09 DIAGNOSIS — S8992XA Unspecified injury of left lower leg, initial encounter: Secondary | ICD-10-CM | POA: Insufficient documentation

## 2020-09-09 DIAGNOSIS — Y9302 Activity, running: Secondary | ICD-10-CM | POA: Insufficient documentation

## 2020-09-09 DIAGNOSIS — W010XXA Fall on same level from slipping, tripping and stumbling without subsequent striking against object, initial encounter: Secondary | ICD-10-CM | POA: Diagnosis not present

## 2020-09-09 DIAGNOSIS — M25562 Pain in left knee: Secondary | ICD-10-CM | POA: Diagnosis not present

## 2020-09-09 DIAGNOSIS — F17210 Nicotine dependence, cigarettes, uncomplicated: Secondary | ICD-10-CM | POA: Diagnosis not present

## 2020-09-09 MED ORDER — ACETAMINOPHEN 325 MG PO TABS
650.0000 mg | ORAL_TABLET | Freq: Once | ORAL | Status: AC
  Administered 2020-09-09: 650 mg via ORAL
  Filled 2020-09-09: qty 2

## 2020-09-09 NOTE — ED Provider Notes (Signed)
Emergency Medicine Provider Triage Evaluation Note  Dawn Brock , a 35 y.o. female  was evaluated in triage.  Pt complains of a fall yesterday while running with her kids. Landed on left knee. Hard to bear weight-thinks its her meniscus.   Review of Systems  Positive: Pain and difficulty walking Negative: Numbness/tingling  Physical Exam  Ht '5\' 7"'$  (1.702 m)   Wt 102 kg   BMI 35.22 kg/m  Gen:   Awake, no distress   Resp:  Normal effort  MSK:   Moves extremities without difficult. Can still flex and extend.  Medical Decision Making  Medically screening exam initiated at 9:16 PM.  Appropriate orders placed.  Dawn Brock was informed that the remainder of the evaluation will be completed by another provider, this initial triage assessment does not replace that evaluation, and the importance of remaining in the ED until their evaluation is complete.     Darliss Ridgel 09/09/20 2125    Teressa Lower, MD 09/10/20 [phone]

## 2020-09-09 NOTE — ED Triage Notes (Signed)
Patient fell and injured her left knee yesterday with pain and swelling unrelieved by OTC pain medication .

## 2020-09-10 DIAGNOSIS — J111 Influenza due to unidentified influenza virus with other respiratory manifestations: Secondary | ICD-10-CM | POA: Diagnosis not present

## 2020-09-10 DIAGNOSIS — S8992XA Unspecified injury of left lower leg, initial encounter: Secondary | ICD-10-CM | POA: Diagnosis not present

## 2020-09-10 MED ORDER — IBUPROFEN 800 MG PO TABS: 800.0000 mg | ORAL_TABLET | Freq: Three times a day (TID) | ORAL | 0 refills | Status: DC

## 2020-09-10 NOTE — ED Provider Notes (Signed)
[facility] EMERGENCY DEPARTMENT Provider Note   CSN: [id number] Arrival date & time: 09/09/20  2057     History Chief Complaint  Patient presents with   Knee Pain/Swelling    Dawn Brock is a 35 y.o. female.  Patient presents to the emergency department with a chief complaint of left knee pain.  She states that she fell while running around with her child yesterday.  She states that today the knee is significantly swollen and she is unable to ambulate on it.  She denies any successful treatments prior to arrival, but did say that she applied some frozen fruit.  She states that it feels unstable when she tries to walk on it.  She felt a pop when she fell.  The history is provided by the patient. No language interpreter was used.      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[place]) 02/22/2013   Alcohol dependence ([place]) 03/12/2012   Cannabis dependence ([place]) 03/12/2012   Executive function deficit 03/11/2012    Class: Acute   Cigarette smoker 03/10/2012   Duodenitis with nausea, vomiting, diarrhea and abdominal pain 03/10/2012    Past Surgical History:  Procedure Laterality Date   HERNIA REPAIR     As an infant   LEEP       OB History     Gravida  4   Para  4   Term  4   Preterm  0   AB  0   Living  4      SAB  0   IAB  0   Ectopic  0   Multiple  0   Live Births  4           Family History  Problem Relation Age of Onset   Hypertension Mother    Fibromyalgia Mother    Cirrhosis Father    Kidney disease Father    Asthma Brother    Birth defects Son        hirschprungs     Social History   Tobacco Use   Smoking status: Every Day    Packs/day: 0.50    Years: 14.00    Pack years: 7.00    Types: Cigarettes   Smokeless tobacco: Never  Vaping Use   Vaping Use: Never used  Substance Use Topics   Alcohol use: Yes    Comment: Not since June 2014   Drug use: Yes    Types: Cocaine, Marijuana    Comment: Patient denies any recent use of Marijuna and cocaine as of 07/02/12    LAST SMOKED  MARIJUANA-   LAST WEEK LAST USED COCAINE-    2015    Home Medications Prior to Admission medications   Medication Sig Start Date End Date Taking? Authorizing Provider  benzonatate (TESSALON) 100 MG capsule Take 1-2 capsules (100-200 mg total) by mouth 3 (three) times daily as needed. 01/16/20   Jaynee Eagles, PA-C  cetirizine (ZYRTEC ALLERGY) 10 MG tablet Take 1 tablet (10 mg total) by mouth daily. 01/16/20   Jaynee Eagles, PA-C  levocetirizine Harlow Ohms)  5 MG tablet Take 1 tablet (5 mg total) by mouth every evening. 05/07/19   Jaynee Eagles, PA-C  promethazine-dextromethorphan (PROMETHAZINE-DM) 6.25-15 MG/5ML syrup Take 5 mLs by mouth at bedtime as needed for cough. 01/16/20   Jaynee Eagles, PA-C  pseudoephedrine (SUDAFED) 60 MG tablet Take 1 tablet (60 mg total) by mouth every 8 (eight) hours as needed for congestion. 01/16/20   Jaynee Eagles, PA-C    Allergies    Tramadol  Review of Systems   Review of Systems  All other systems reviewed and are negative.  Physical Exam Updated Vital Signs BP 131/74 (BP Location: Left Arm)   Pulse 80   Temp 98 F (36.7 C) (Oral)   Resp 16   Ht '5\' 7"'$  (1.702 m)   Wt 102 kg   LMP 08/29/2020   SpO2 100%   BMI 35.22 kg/m   Physical Exam Nursing note and vitals reviewed.  Constitutional: Pt appears well-developed and well-nourished. No distress.  HENT:  Head: Normocephalic and atraumatic.  Eyes: Conjunctivae are normal.  Neck: Normal range of motion.  Cardiovascular: Normal rateIntact distal pulses.   Capillary refill < 3 sec.   Pulmonary/Chest: Effort normal Musculoskeletal:  Left knee Pt exhibits moderate swelling.   ROM: 4/5 limited by pain Strength: 4/5 limited by pain  Neurological: Pt  is alert. Coordination normal.  Sensation: 5/5 Skin: Skin is warm and dry. Pt is not diaphoretic.  No evidence of open wound or skin tenting Psychiatric: Pt has a normal mood and affect.   ED Results / Procedures / Treatments   Labs (all labs ordered are listed, but only abnormal results are displayed) Labs Reviewed - No data to display  EKG None  Radiology DG Knee Complete 4 Views Left  Result Date: 09/09/2020 CLINICAL DATA:  Status post fall. EXAM: LEFT KNEE - COMPLETE 4+ VIEW COMPARISON:  None. FINDINGS: No evidence of an acute fracture or dislocation. No evidence of arthropathy or other focal bone abnormality. A small to moderate sized joint effusion is seen. IMPRESSION: 1. No acute fracture or dislocation. 2. Small to moderate sized joint effusion. Electronically Signed   By: Virgina Norfolk M.D.   On: 09/09/2020 22:15    Procedures Procedures   Medications Ordered in ED Medications  acetaminophen (TYLENOL) tablet 650 mg (650 mg Oral Given 09/09/20 2132)    ED Course  I have reviewed the triage vital signs and the nursing notes.  Pertinent labs & imaging results that were available during my care of the patient were reviewed by me and considered in my medical decision making (see chart for details).    MDM Rules/Calculators/A&P                           Patient presents with injury to left knee.  DDx includes, fracture, strain, or sprain.  Consultants: none  Plain films reveal joint effusion, but no fx.  Pt advised to follow up with PCP and/or orthopedics. Patient given knee immobilizer and crutches while in ED, conservative therapy such as RICE recommended and discussed.   Patient will be discharged home & is agreeable with above plan. Returns precautions discussed. Pt appears safe for  discharge.  Final Clinical Impression(s) / ED Diagnoses Final diagnoses:  Injury of left knee, initial encounter    Rx / DC Orders ED Discharge Orders          Ordered    ibuprofen (ADVIL) 800 MG tablet  3 times daily  09/10/20 0120             Montine Circle, PA-C 09/10/20 0121    Merryl Hacker, MD 09/10/20 [phone]

## 2020-09-10 NOTE — Discharge Instructions (Addendum)
Please wear the knee immobilizer and use crutches.  Please make an appointment with the orthopedic doctor listed.  You should not return to work until you are cleared by the orthopedic.

## 2020-09-12 ENCOUNTER — Ambulatory Visit (INDEPENDENT_AMBULATORY_CARE_PROVIDER_SITE_OTHER): Payer: Medicaid Other | Admitting: Orthopedic Surgery

## 2020-09-12 DIAGNOSIS — S8002XA Contusion of left knee, initial encounter: Secondary | ICD-10-CM

## 2020-09-17 ENCOUNTER — Encounter: Payer: Self-pay | Admitting: Orthopedic Surgery

## 2020-09-17 NOTE — Progress Notes (Signed)
Office Visit Note   Patient: Dawn Brock           Date of Birth: November 30, 1985           MRN: [id number] Visit Date: 09/12/2020              Requested by: Glendale Chard, [place] [address],  [place] [address] PCP: Glendale Chard, MD  Chief Complaint  Patient presents with   Right Knee - Pain    S/p fall ER visit 09/09/20      HPI: Patient is a 35 year old woman who is seen for initial evaluation for left knee pain.  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  She states that she is in and out of steps on her truck.  She states that her leg felt heavy and swollen and then she fell playing with her children with direct impact on her knee.  Patient is currently wearing a knee immobilizer.  Assessment & Plan:  Visit Diagnoses:  1. Contusion of left knee, initial encounter     Plan: We will give patient a knee brace discontinue the immobilizer reevaluate in the office in 4 weeks.  Patient was given instruction to use Voltaren gel 3-4 times a day.  Follow-Up Instructions: Return in about 4 weeks (around 10/10/2020).   Ortho Exam  Patient is alert, oriented, no adenopathy, well-dressed, normal affect, normal respiratory effort. Examination patient does have swelling around the left knee there is no effusion collaterals are cruciates are stable she is globally tender to palpation.  Imaging: No results found. No images are attached to the encounter.  Labs: Lab Results  Component Value Date   HGBA1C 5.7 (H) 01/29/2012   REPTSTATUS 05/11/2016 FINAL 05/10/2016   GRAMSTAIN  11/25/2011    MODERATE WBC PRESENT,BOTH PMN AND MONONUCLEAR NO SQUAMOUS EPITHELIAL CELLS SEEN MODERATE GRAM POSITIVE COCCI IN PAIRS FEW GRAM POSITIVE RODS   CULT (A) 05/10/2016    MULTIPLE SPECIES PRESENT, SUGGEST RECOLLECTION NO GROUP B STREP (S.AGALACTIAE) ISOLATED Performed at [facility], [place] [address] [place], [place] [address]      Lab Results  Component  Value Date   ALBUMIN 4.0 03/05/2019   ALBUMIN 3.5 08/29/2016   ALBUMIN 3.4 (L) 08/21/2016    No results found for: MG No results found for: VD25OH  No results found for: PREALBUMIN CBC EXTENDED Latest Ref Rng & Units 05/05/2019 03/05/2019 11/02/2017  WBC 4.0 - 10.5 K/uL 7.9 7.4 18.3(H)  RBC 3.87 - 5.11 MIL/uL 4.40 4.51 4.49  HGB 12.0 - 15.0 g/dL 14.0 14.7 13.7  HCT 36.0 - 46.0 % 43.5 45.2 43.4  PLT 150 - 400 K/uL 250 243 230  NEUTROABS 1.7 - 7.7 K/uL - - 15.2(H)  LYMPHSABS 0.7 - 4.0 K/uL - - 1.8     There is no height or weight on file to calculate BMI.  Orders:  No orders of the defined types were placed in this encounter.  No orders of the defined types were placed in this encounter.    Procedures: No procedures performed  Clinical Data: No additional findings.  ROS:  All other systems negative, except as noted in the HPI. Review of Systems  Objective: Vital Signs: LMP 08/29/2020   Specialty Comments:  No specialty comments available.  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[place]) 02/22/2013   Alcohol dependence ([place]) 03/12/2012   Cannabis dependence ([place]) 03/12/2012   Executive function deficit 03/11/2012    Class: Acute   Cigarette smoker 03/10/2012   Duodenitis with nausea, vomiting, diarrhea and abdominal pain 03/10/2012   Past Medical History:  Diagnosis Date   Abnormal Pap smear    Anemia    Gastritis    Hx of chlamydia infection    Vaginal Pap smear, abnormal     Family History  Problem Relation Age of Onset   Hypertension Mother    Fibromyalgia Mother    Cirrhosis Father    Kidney disease Father    Asthma Brother    Birth defects Son        hirschprungs    Past Surgical History:  Procedure Laterality Date   HERNIA REPAIR      As an infant   LEEP     Social History   Occupational History   Not on file  Tobacco Use   Smoking status: Every Day    Packs/day: 0.50    Years: 14.00    Pack years: 7.00    Types: Cigarettes   Smokeless tobacco: Never  Vaping Use   Vaping Use: Never used  Substance and Sexual Activity   Alcohol use: Yes    Comment: Not since June 2014   Drug use: Yes    Types: Cocaine, Marijuana    Comment: Patient denies any recent use of Marijuna and cocaine as of 07/02/12    LAST SMOKED  MARIJUANA-   LAST WEEK LAST USED COCAINE-    2015   Sexual activity: Yes

## 2020-09-18 DIAGNOSIS — J111 Influenza due to unidentified influenza virus with other respiratory manifestations: Secondary | ICD-10-CM | POA: Diagnosis not present

## 2020-10-14 DIAGNOSIS — J111 Influenza due to unidentified influenza virus with other respiratory manifestations: Secondary | ICD-10-CM | POA: Diagnosis not present

## 2020-11-11 ENCOUNTER — Encounter: Payer: Self-pay | Admitting: Orthopedic Surgery

## 2020-11-11 ENCOUNTER — Ambulatory Visit (INDEPENDENT_AMBULATORY_CARE_PROVIDER_SITE_OTHER): Payer: Medicaid Other | Admitting: Orthopedic Surgery

## 2020-11-11 ENCOUNTER — Other Ambulatory Visit: Payer: Self-pay

## 2020-11-11 DIAGNOSIS — S8002XA Contusion of left knee, initial encounter: Secondary | ICD-10-CM

## 2020-11-11 NOTE — Progress Notes (Signed)
Office Visit Note   Patient: Dawn Brock           Date of Birth: 1985/09/22           MRN: [id number] Visit Date: 11/11/2020              Requested by: No referring provider defined for this encounter. PCP: No primary care provider on file.  Chief Complaint  Patient presents with   Left Knee - Pain, Follow-up      HPI: Patient is a 35 year old woman who presents with persistent left knee pain.  She initially fell on August 29 radiographs showed no fractures or acute injuries.  Most recent office visit visit on September 1 patient was given a knee brace and recommended Voltaren gel she states the brace and the gel have not helped.  She complains of global pain and swelling in the popliteal fossa.  Assessment & Plan: Visit Diagnoses:  1. Contusion of left knee, initial encounter     Plan: MRI scan is ordered to rule out occult stress fracture.  Follow-Up Instructions: Return if symptoms worsen or fail to improve.   Ortho Exam  Patient is alert, oriented, no adenopathy, well-dressed, normal affect, normal respiratory effort. Examination patient has extreme pain with light touch globally around the knee ligamentous stability cannot be determined.  There is no significant effusion there is no redness no cellulitis.  Imaging: No results found. No images are attached to the encounter.  Labs: Lab Results  Component Value Date   HGBA1C 5.7 (H) 01/29/2012   REPTSTATUS 05/11/2016 FINAL 05/10/2016   GRAMSTAIN  11/25/2011    MODERATE WBC PRESENT,BOTH PMN AND MONONUCLEAR NO SQUAMOUS EPITHELIAL CELLS SEEN MODERATE GRAM POSITIVE COCCI IN PAIRS FEW GRAM POSITIVE RODS   CULT (A) 05/10/2016    MULTIPLE SPECIES PRESENT, SUGGEST RECOLLECTION NO GROUP B STREP (S.AGALACTIAE) ISOLATED Performed at [facility], [place] [address] [place], [place] [address]      Lab Results  Component Value Date   ALBUMIN 4.0 03/05/2019   ALBUMIN 3.5 08/29/2016   ALBUMIN 3.4 (L)  08/21/2016    No results found for: MG No results found for: VD25OH  No results found for: PREALBUMIN CBC EXTENDED Latest Ref Rng & Units 05/05/2019 03/05/2019 11/02/2017  WBC 4.0 - 10.5 K/uL 7.9 7.4 18.3(H)  RBC 3.87 - 5.11 MIL/uL 4.40 4.51 4.49  HGB 12.0 - 15.0 g/dL 14.0 14.7 13.7  HCT 36.0 - 46.0 % 43.5 45.2 43.4  PLT 150 - 400 K/uL 250 243 230  NEUTROABS 1.7 - 7.7 K/uL - - 15.2(H)  LYMPHSABS 0.7 - 4.0 K/uL - - 1.8     There is no height or weight on file to calculate BMI.  Orders:  No orders of the defined types were placed in this encounter.  No orders of the defined types were placed in this encounter.    Procedures: No procedures performed  Clinical Data: No additional findings.  ROS:  All other systems negative, except as noted in the HPI. Review of Systems  Objective: Vital Signs: There were no vitals taken for this visit.  Specialty Comments:  No specialty comments available.  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[place]) 02/22/2013   Alcohol dependence ([place]) 03/12/2012   Cannabis dependence ([place]) 03/12/2012  Executive function deficit 03/11/2012    Class: Acute   Cigarette smoker 03/10/2012   Duodenitis with nausea, vomiting, diarrhea and abdominal pain 03/10/2012   Past Medical History:  Diagnosis Date   Abnormal Pap smear    Anemia    Gastritis    Hx of chlamydia infection    Vaginal Pap smear, abnormal     Family History  Problem Relation Age of Onset   Hypertension Mother    Fibromyalgia Mother    Cirrhosis Father    Kidney disease Father    Asthma Brother    Birth defects Son        hirschprungs    Past Surgical History:  Procedure Laterality Date   HERNIA REPAIR     As an infant   LEEP     Social History    Occupational History   Not on file  Tobacco Use   Smoking status: Every Day    Packs/day: 0.50    Years: 14.00    Pack years: 7.00    Types: Cigarettes   Smokeless tobacco: Never  Vaping Use   Vaping Use: Never used  Substance and Sexual Activity   Alcohol use: Yes    Comment: Not since June 2014   Drug use: Yes    Types: Cocaine, Marijuana    Comment: Patient denies any recent use of Marijuna and cocaine as of 07/02/12    LAST SMOKED  MARIJUANA-   LAST WEEK LAST USED COCAINE-    2015   Sexual activity: Yes

## 2020-11-15 ENCOUNTER — Telehealth: Payer: Self-pay | Admitting: Orthopedic Surgery

## 2020-11-15 ENCOUNTER — Ambulatory Visit: Payer: Medicaid Other | Admitting: Family

## 2020-11-15 NOTE — Telephone Encounter (Signed)
Tried calling the home number - not in service. Tried her mobile - no answer and the voice mail has not been set up. We are waiting for Junie Panning to do a peer-to-peer with the insurance company, in order to get the MRI authorized.

## 2020-11-15 NOTE — Telephone Encounter (Signed)
Pt called asking for a CB to update her on an MRI she was supposed to have. She states last she heard [employer] was waiting for more info from [place]. Pt would like a CB to b updated on what's going on.   [phone]

## 2020-11-16 ENCOUNTER — Other Ambulatory Visit: Payer: Medicaid Other

## 2020-11-19 NOTE — Telephone Encounter (Signed)
Dawn Brock spoke with the patient today regarding the prior authorization and new appointment.

## 2020-11-21 ENCOUNTER — Other Ambulatory Visit: Payer: Self-pay

## 2020-11-21 ENCOUNTER — Ambulatory Visit
Admission: RE | Admit: 2020-11-21 | Discharge: 2020-11-21 | Disposition: A | Discharge status: Home / Self Care | Payer: Medicaid Other | Source: Ambulatory Visit | Attending: Orthopedic Surgery | Admitting: Orthopedic Surgery

## 2020-11-21 DIAGNOSIS — M7122 Synovial cyst of popliteal space [Baker], left knee: Secondary | ICD-10-CM | POA: Diagnosis not present

## 2020-11-21 DIAGNOSIS — S83412A Sprain of medial collateral ligament of left knee, initial encounter: Secondary | ICD-10-CM | POA: Diagnosis not present

## 2020-11-21 DIAGNOSIS — M25462 Effusion, left knee: Secondary | ICD-10-CM | POA: Diagnosis not present

## 2020-11-21 DIAGNOSIS — S8002XA Contusion of left knee, initial encounter: Secondary | ICD-10-CM

## 2020-11-26 ENCOUNTER — Other Ambulatory Visit: Payer: Medicaid Other

## 2020-11-27 ENCOUNTER — Telehealth: Payer: Self-pay

## 2020-11-27 NOTE — Telephone Encounter (Signed)
Please advise 

## 2020-11-27 NOTE — Telephone Encounter (Signed)
The patient called triage, asking for her MRI results on her knee. She did not make a f/u for this -- she was thinking someone would call her. Her knee is no worse, but no better:.. continues to swell and hurt with much standing, walking & climbing up/down stairs. All of these activities she is having to do on her job.   Can she get these results by phone, or does she need to come in? [phone].

## 2020-11-29 ENCOUNTER — Telehealth: Payer: Self-pay | Admitting: Family

## 2020-11-29 ENCOUNTER — Ambulatory Visit (INDEPENDENT_AMBULATORY_CARE_PROVIDER_SITE_OTHER): Payer: Medicaid Other | Admitting: Family

## 2020-11-29 DIAGNOSIS — G8929 Other chronic pain: Secondary | ICD-10-CM

## 2020-11-29 DIAGNOSIS — M25562 Pain in left knee: Secondary | ICD-10-CM | POA: Diagnosis not present

## 2020-11-29 MED ORDER — METHYLPREDNISOLONE ACETATE 40 MG/ML IJ SUSP
40.0000 mg | INTRAMUSCULAR | Status: AC | PRN
  Administered 2020-11-29: 40 mg via INTRA_ARTICULAR

## 2020-11-29 MED ORDER — LIDOCAINE HCL 1 % IJ SOLN
5.0000 mL | INTRAMUSCULAR | Status: AC | PRN
  Administered 2020-11-29: 5 mL

## 2020-11-29 NOTE — Progress Notes (Signed)
Office Visit Note   Patient: Dawn Brock           Date of Birth: March 02, 1985           MRN: [id number] Visit Date: 11/29/2020              Requested by: No referring provider defined for this encounter. PCP: Pcp, No  Chief Complaint  Patient presents with   Left Knee - Pain      HPI: The patient is a 35 year old woman who presents today for Depo-Medrol injection of her right knee.  She has been having issues with pain and swelling especially with weightbearing and flexion for 18 months now.  She does have a recent MRI scan on file reviewed this today.  She states that she is very active and the chronic knee pain has been inhibiting her activities of daily living.  Assessment & Plan: Visit Diagnoses: No diagnosis found.  Plan: Depo-Medrol injection today.  Patient tolerated this well.  She will follow-up in the office in 3 to 4 weeks as needed  Follow-Up Instructions: Return if symptoms worsen or fail to improve.   Left Knee Exam   Tenderness  The patient is experiencing tenderness in the medial joint line and MCL.  Range of Motion  The patient has normal left knee ROM.  Tests  [place]:  Medial - negative Lateral - negative  Other  Erythema: absent Swelling: none Effusion: no effusion present     Patient is alert, oriented, no adenopathy, well-dressed, normal affect, normal respiratory effort. Global tenderness  Imaging: No results found. No images are attached to the encounter.  Labs: Lab Results  Component Value Date   HGBA1C 5.7 (H) 01/29/2012   REPTSTATUS 05/11/2016 FINAL 05/10/2016   GRAMSTAIN  11/25/2011    MODERATE WBC PRESENT,BOTH PMN AND MONONUCLEAR NO SQUAMOUS EPITHELIAL CELLS SEEN MODERATE GRAM POSITIVE COCCI IN PAIRS FEW GRAM POSITIVE RODS   CULT (A) 05/10/2016    MULTIPLE SPECIES PRESENT, SUGGEST RECOLLECTION NO GROUP B STREP (S.AGALACTIAE) ISOLATED Performed at [facility], [place] [address] [place], [place]  [address]      Lab Results  Component Value Date   ALBUMIN 4.0 03/05/2019   ALBUMIN 3.5 08/29/2016   ALBUMIN 3.4 (L) 08/21/2016    No results found for: MG No results found for: VD25OH  No results found for: PREALBUMIN CBC EXTENDED Latest Ref Rng & Units 05/05/2019 03/05/2019 11/02/2017  WBC 4.0 - 10.5 K/uL 7.9 7.4 18.3(H)  RBC 3.87 - 5.11 MIL/uL 4.40 4.51 4.49  HGB 12.0 - 15.0 g/dL 14.0 14.7 13.7  HCT 36.0 - 46.0 % 43.5 45.2 43.4  PLT 150 - 400 K/uL 250 243 230  NEUTROABS 1.7 - 7.7 K/uL - - 15.2(H)  LYMPHSABS 0.7 - 4.0 K/uL - - 1.8     There is no height or weight on file to calculate BMI.  Orders:  No orders of the defined types were placed in this encounter.  No orders of the defined types were placed in this encounter.    Procedures: Large Joint Inj: L knee on 11/29/2020 11:21 AM Indications: pain Details: 18 G 1.5 in needle, anteromedial approach Medications: 5 mL lidocaine 1 %; 40 mg methylPREDNISolone acetate 40 MG/ML Consent was given by the patient.     Clinical Data: No additional findings.  ROS:  All other systems negative, except as noted in the HPI. Review of Systems  Objective: Vital Signs: There were no vitals taken for this  visit.  Specialty Comments:  No specialty comments available.  PMFS History: Patient Active Problem List   Diagnosis Date Noted   H/O LEEP 08/21/2016   Headache 08/21/2016   Mild tetrahydrocannabinol (THC) abuse 08/21/2016   History of substance abuse ([place]) 08/21/2016   Cocaine abuse complicating pregnancy ([place]) 02/22/2013   Alcohol dependence ([place]) 03/12/2012   Cannabis dependence ([place]) 03/12/2012   Executive function deficit 03/11/2012    Class: Acute   Cigarette smoker 03/10/2012   Duodenitis with nausea, vomiting, diarrhea and abdominal pain 03/10/2012   Past Medical History:  Diagnosis Date   Abnormal Pap smear    Anemia    Gastritis    Hx of chlamydia infection    Vaginal Pap smear, abnormal      Family History  Problem Relation Age of Onset   Hypertension Mother    Fibromyalgia Mother    Cirrhosis Father    Kidney disease Father    Asthma Brother    Birth defects Son        hirschprungs    Past Surgical History:  Procedure Laterality Date   HERNIA REPAIR     As an infant   LEEP     Social History   Occupational History   Not on file  Tobacco Use   Smoking status: Every Day    Packs/day: 0.50    Years: 14.00    Pack years: 7.00    Types: Cigarettes   Smokeless tobacco: Never  Vaping Use   Vaping Use: Never used  Substance and Sexual Activity   Alcohol use: Yes    Comment: Not since June 2014   Drug use: Yes    Types: Cocaine, Marijuana    Comment: Patient denies any recent use of Marijuna and cocaine as of 07/02/12    LAST SMOKED  MARIJUANA-   LAST WEEK LAST USED COCAINE-    2015   Sexual activity: Yes

## 2020-11-29 NOTE — Telephone Encounter (Signed)
I tried number x 2 and message states tht the number is not in service. Pt received a cortisone injection today and should give the injection time to see if relieves the knee pain. Will hold and try again later.

## 2020-11-29 NOTE — Telephone Encounter (Signed)
I called and sw pt and advised that she can take what she normally does OTC and should give the injection a few days to see if this helps the pain that she is having. Pt will call with questions.

## 2020-11-29 NOTE — Telephone Encounter (Signed)
Patient called. She would like pain medication called in. Her call back number is [phone]

## 2020-11-29 NOTE — Telephone Encounter (Signed)
Pt asking if any pain medication can be called in for her after her appt with Erin this morning. The best pharmacy is [id number] - [place], [place] - [place] and the best call back number is [phone].

## 2020-11-29 NOTE — Telephone Encounter (Signed)
Duplicate this has been done.

## 2021-01-06 ENCOUNTER — Other Ambulatory Visit: Payer: Self-pay

## 2021-01-06 ENCOUNTER — Encounter (HOSPITAL_COMMUNITY): Payer: Self-pay

## 2021-01-06 ENCOUNTER — Ambulatory Visit (HOSPITAL_COMMUNITY)
Admission: EM | Admit: 2021-01-06 | Discharge: 2021-01-06 | Disposition: A | Discharge status: Home / Self Care | Payer: Medicaid Other | Attending: Family Medicine | Admitting: Family Medicine

## 2021-01-06 DIAGNOSIS — J069 Acute upper respiratory infection, unspecified: Secondary | ICD-10-CM

## 2021-01-06 DIAGNOSIS — U071 COVID-19: Secondary | ICD-10-CM | POA: Insufficient documentation

## 2021-01-06 LAB — POC INFLUENZA A AND B ANTIGEN (URGENT CARE ONLY)
INFLUENZA A ANTIGEN, POC: NEGATIVE
INFLUENZA B ANTIGEN, POC: NEGATIVE

## 2021-01-06 NOTE — ED Provider Notes (Signed)
[facility]    CSN: [id number] Arrival date & time: 01/06/21  1845      History   Chief Complaint Chief Complaint  Patient presents with   Sore Throat   Cough   Nasal Congestion   Shortness of Breath    HPI Dawn Brock is a 34 y.o. female.    Sore Throat Associated symptoms include shortness of breath.  Cough Associated symptoms: shortness of breath   Shortness of Breath Associated symptoms: cough   Here with a 1 day h/o sore throat, cough, congestion, and malaise. Has had some chills, though has not measured a fever. Feels a little short of breath when coughs  No n/v/d.   Past Medical History:  Diagnosis Date   Abnormal Pap smear    Anemia    Gastritis    Hx of chlamydia infection    Vaginal Pap smear, abnormal     Patient Active Problem List   Diagnosis Date Noted   H/O LEEP 08/21/2016   Headache 08/21/2016   Mild tetrahydrocannabinol (THC) abuse 08/21/2016   History of substance abuse ([place]) 08/21/2016   Cocaine abuse complicating pregnancy ([place]) 02/22/2013   Alcohol dependence ([place]) 03/12/2012   Cannabis dependence ([place]) 03/12/2012   Executive function deficit 03/11/2012    Class: Acute   Cigarette smoker 03/10/2012   Duodenitis with nausea, vomiting, diarrhea and abdominal pain 03/10/2012    Past Surgical History:  Procedure Laterality Date   HERNIA REPAIR     As an infant   LEEP      OB History     Gravida  4   Para  4   Term  4   Preterm  0   AB  0   Living  4      SAB  0   IAB  0   Ectopic  0   Multiple  0   Live Births  4            Home Medications    Prior to Admission medications   Not on File    Family History Family History  Problem Relation Age of Onset   Hypertension Mother    Fibromyalgia Mother    Cirrhosis Father    Kidney disease Father    Asthma Brother    Birth defects Son        hirschprungs    Social History Social History   Tobacco Use   Smoking status:  Every Day    Packs/day: 0.50    Years: 14.00    Pack years: 7.00    Types: Cigarettes   Smokeless tobacco: Never  Vaping Use   Vaping Use: Never used  Substance Use Topics   Alcohol use: Yes    Comment: Not since June 2014   Drug use: Yes    Types: Cocaine, Marijuana    Comment: Patient denies any recent use of Marijuna and cocaine as of 07/02/12    LAST SMOKED  MARIJUANA-   LAST WEEK LAST USED COCAINE-    2015     Allergies   Tramadol   Review of Systems Review of Systems  Respiratory:  Positive for cough and shortness of breath.     Physical Exam Triage Vital Signs ED Triage Vitals  Enc Vitals Group     BP 01/06/21 1932 119/86     Pulse Rate 01/06/21 1932 83     Resp 01/06/21 1932 19     Temp 01/06/21 1932 98.3 F (36.8  C)     Temp Source 01/06/21 1932 Oral     SpO2 01/06/21 1932 98 %     Weight --      Height --      Head Circumference --      Peak Flow --      Pain Score 01/06/21 1931 0     Pain Loc --      Pain Edu? --      Excl. in [place]? --    No data found.  Updated Vital Signs BP 119/86 (BP Location: Right Arm)    Pulse 83    Temp 98.3 F (36.8 C) (Oral)    Resp 19    LMP 12/13/2020 (Exact Date)    SpO2 98%   Visual Acuity Right Eye Distance:   Left Eye Distance:   Bilateral Distance:    Right Eye Near:   Left Eye Near:    Bilateral Near:     Physical Exam Constitutional:      General: She is not in acute distress.    Appearance: She is not toxic-appearing.  HENT:     Right Ear: Tympanic membrane and ear canal normal.     Left Ear: Tympanic membrane and ear canal normal.     Nose: Congestion present.     Mouth/Throat:     Mouth: Mucous membranes are moist.     Pharynx: Oropharyngeal exudate (clear mucus) and posterior oropharyngeal erythema (mild) present.  Eyes:     Extraocular Movements: Extraocular movements intact.     Conjunctiva/sclera: Conjunctivae normal.     Pupils: Pupils are equal, round, and reactive to light.   Cardiovascular:     Rate and Rhythm: Normal rate and regular rhythm.     Heart sounds: No murmur heard. Pulmonary:     Breath sounds: No wheezing, rhonchi or rales.  Musculoskeletal:     Cervical back: Neck supple.  Lymphadenopathy:     Cervical: No cervical adenopathy.  Skin:    Capillary Refill: Capillary refill takes less than 2 seconds.     Coloration: Skin is not jaundiced or pale.  Neurological:     General: No focal deficit present.     Mental Status: She is alert and oriented to person, place, and time.  Psychiatric:        Behavior: Behavior normal.     UC Treatments / Results  Labs (all labs ordered are listed, but only abnormal results are displayed) Labs Reviewed  SARS CORONAVIRUS 2 (TAT 6-24 HRS)  POC INFLUENZA A AND B ANTIGEN (URGENT CARE ONLY)    EKG   Radiology No results found.  Procedures Procedures (including critical care time)  Medications Ordered in UC Medications - No data to display  Initial Impression / Assessment and Plan / UC Course  I have reviewed the triage vital signs and the nursing notes.  Pertinent labs & imaging results that were available during my care of the patient were reviewed by me and considered in my medical decision making (see chart for details).  Clinical Course as of 01/06/21 2013  Mon Jan 06, 2021  2005 POC Influenza A & B Ag (Urgent Care) [PB]  2006 POC Influenza A & B Ag (Urgent Care) [PB]    Clinical Course User Index [PB] Barrett Henle, MD    Flu test is negative. Nyquil/dayquil as needed for symptoms Final Clinical Impressions(s) / UC Diagnoses   Final diagnoses:  Viral upper respiratory tract infection     Discharge  Instructions      Nyquil/dayquil as needed for symptoms  Your flu test was negative.   You have been swabbed for COVID, and the test will result in the next 24 hours. Our staff will call you if positive. If the test is positive, you should quarantine for 5 days.      ED  Prescriptions   None    PDMP not reviewed this encounter.   Barrett Henle, MD 01/06/21 2013

## 2021-01-06 NOTE — Discharge Instructions (Addendum)
Nyquil/dayquil as needed for symptoms  Your flu test was negative.   You have been swabbed for COVID, and the test will result in the next 24 hours. Our staff will call you if positive. If the test is positive, you should quarantine for 5 days.

## 2021-01-06 NOTE — ED Triage Notes (Signed)
Pt presents with c/o sore throat, cough. Runny nose and SOB. States she feels congested.

## 2021-01-07 LAB — SARS CORONAVIRUS 2 (TAT 6-24 HRS): SARS Coronavirus 2: POSITIVE — AB

## 2021-04-18 IMAGING — US US PELVIS COMPLETE
1 series · 13 of 25 positions shown · non-contrast
Comparison: CT same day.

CLINICAL DATA: Right lower quadrant and pelvic pain over the last 2
days.

EXAM:
TRANSABDOMINAL AND TRANSVAGINAL ULTRASOUND OF PELVIS
TECHNIQUE: Both transabdominal and transvaginal ultrasound examinations of the
pelvis were performed. Transabdominal technique was performed for
global imaging of the pelvis including uterus, ovaries, adnexal
regions, and pelvic cul-de-sac. It was necessary to proceed with
endovaginal exam following the transabdominal exam to visualize the
ovaries more clearly.

[Series 1: us pelvis complete · 79 acquisitions, 13 frames shown]
[im 1/79]
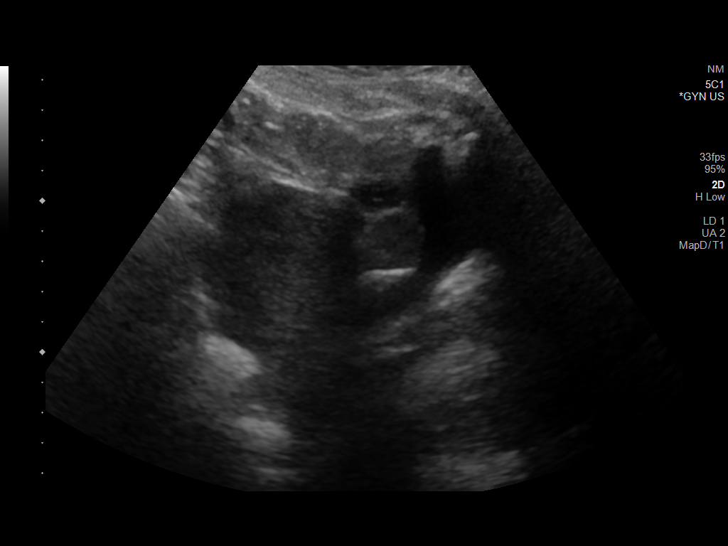
[im 7/79]
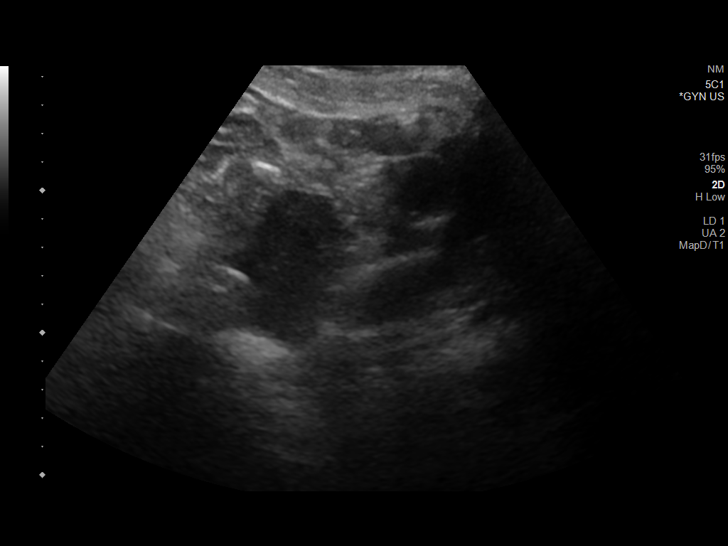
[im 14/79]
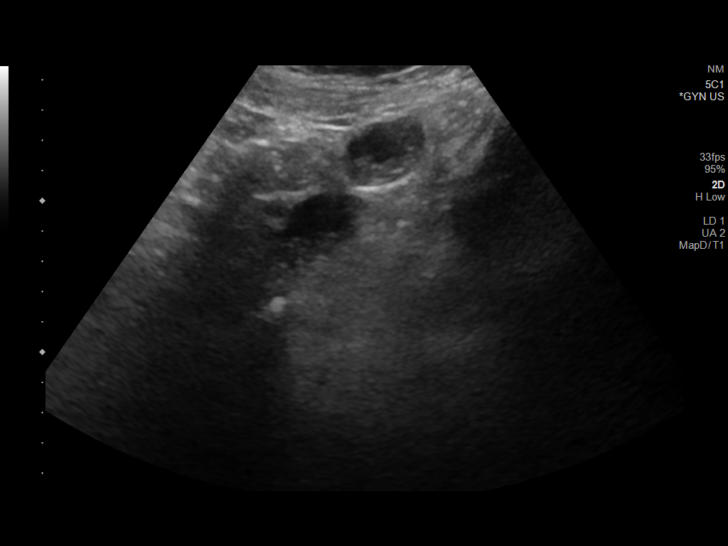
[im 20/79]
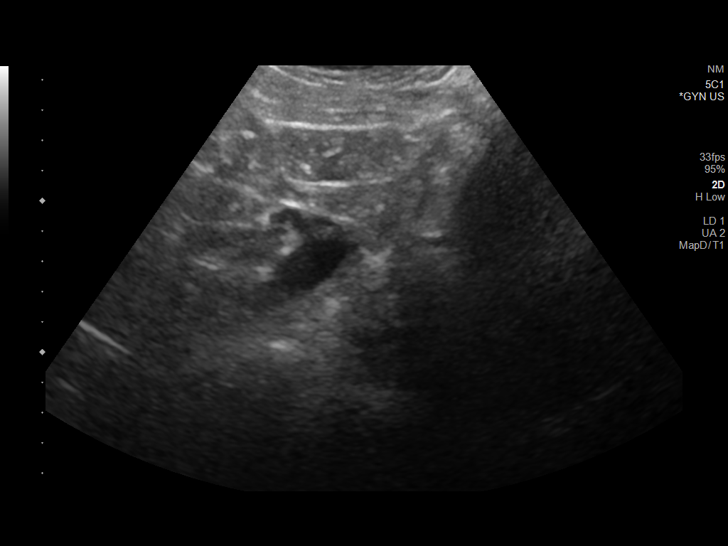
[im 27/79]
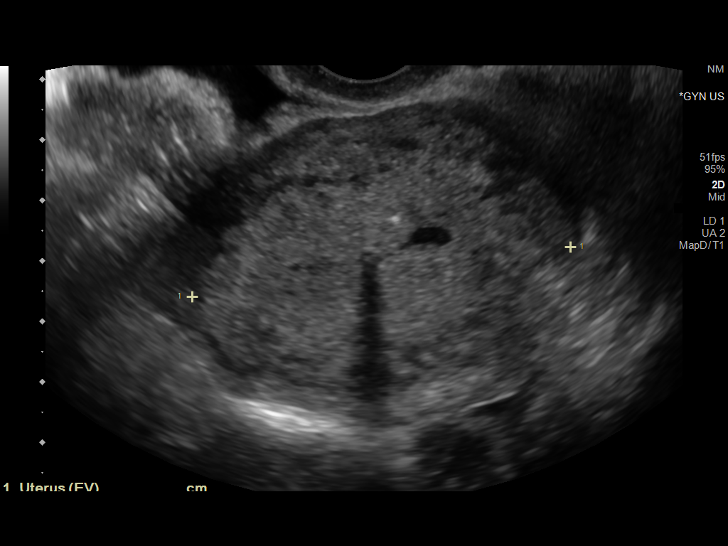
[im 33/79]
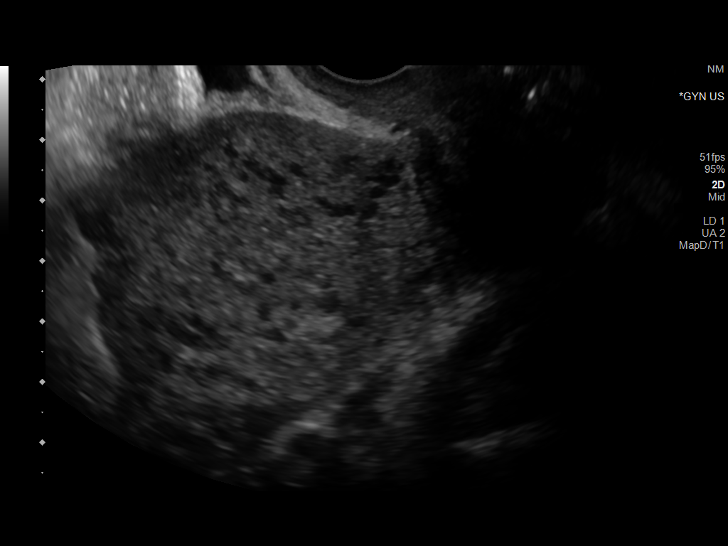
[im 40/79]
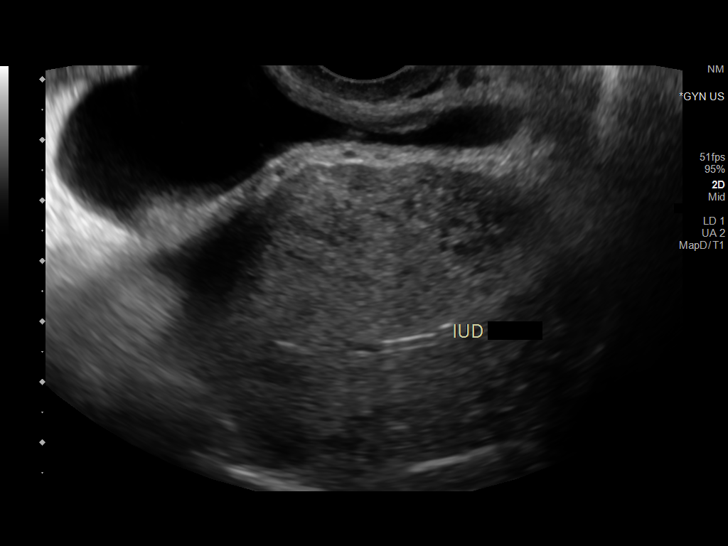
[im 46/79]
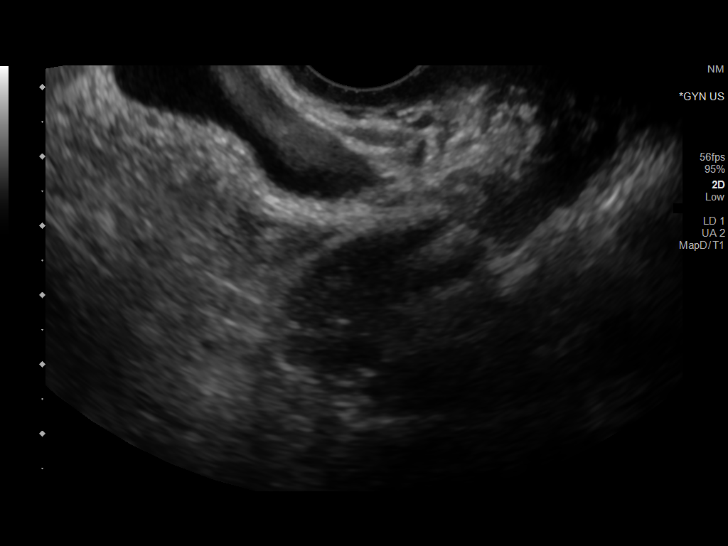
[im 53/79]
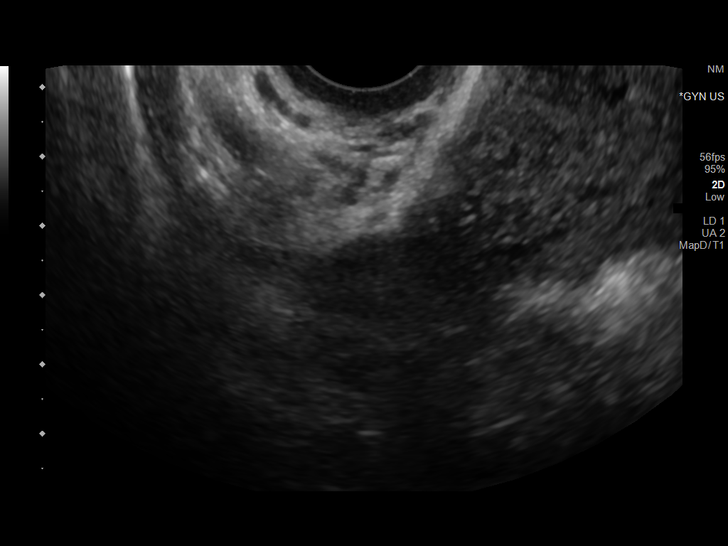
[im 59/79]
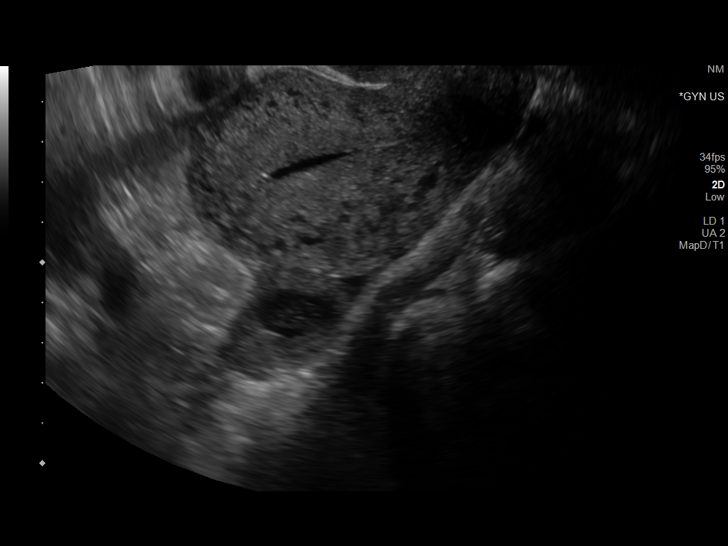
[im 66/79]
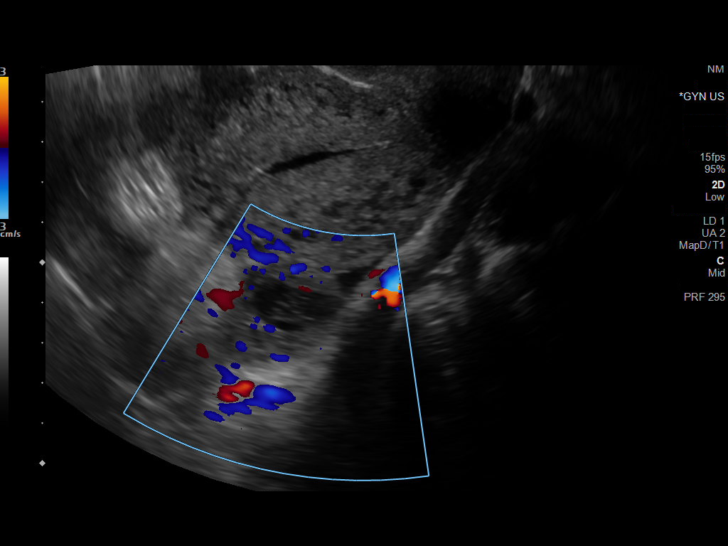
[im 72/79]
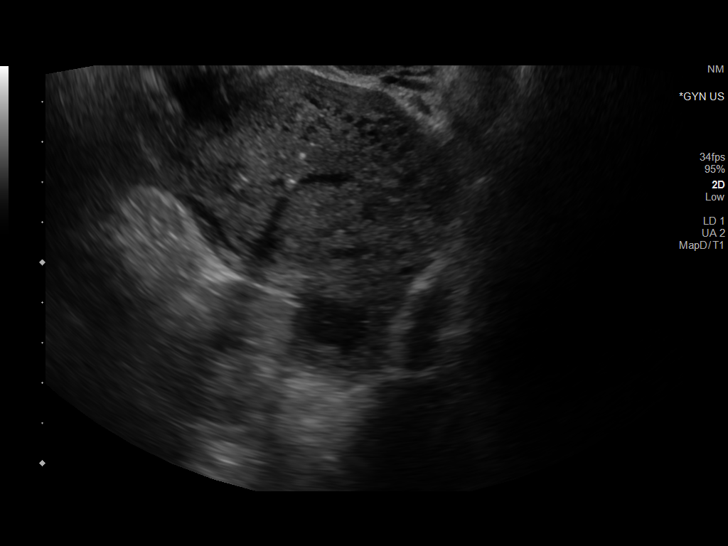
[im 79/79]
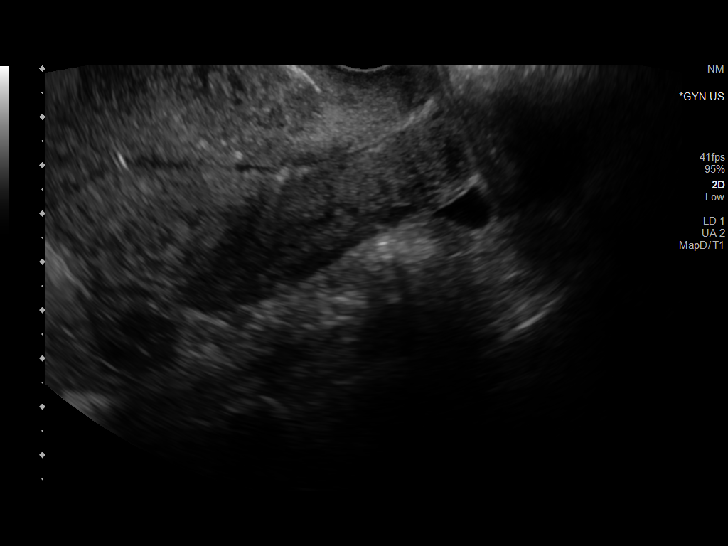

[13 of 25 positions shown; findings below may reference images not displayed]

FINDINGS: Uterus

Measurements: 9.4 x 5.5 x 6.3 cm = volume: 168 mL. No focal lesion.
IUD present within the canal.

Endometrium

IUD in place. Small amount fluid within the canal. No unexpected
finding.

Right ovary

Measurements: 2.3 x 1.4 x 2.6 cm = volume: 4.3 mL. Normal
appearance/no adnexal mass.

Left ovary

Measurements: 4.5 x 2.9 x 2.7 cm = volume: 18.2 mL. 2 x 1.2 x 1.4 cm
complex cyst. This is presumably a functional cyst.

Other findings

Trace amount of free fluid, within normal limits.
IMPRESSION: IUD in place within the uterus. No unexpected finding. Uterus is
self appears otherwise normal.

Normal right ovary.

Left ovary contains a 2 x 1.2 x 1.4 cm cyst, slightly complex,
likely an ordinary functional cyst.

## 2021-04-18 IMAGING — US US ART/VEN ABD/PELV/SCROTUM DOPPLER LTD
1 series · 13 of 25 positions shown · non-contrast
Comparison: CT abdomen pelvis dated 03/05/2019.

CLINICAL DATA: 33-year-old female with right lower quadrant
abdominal pain.

EXAM:
TRANSABDOMINAL AND TRANSVAGINAL ULTRASOUND OF PELVIS
DOPPLER ULTRASOUND OF OVARIES
TECHNIQUE: Both transabdominal and transvaginal ultrasound examinations of the
pelvis were performed. Transabdominal technique was performed for
global imaging of the pelvis including uterus, ovaries, adnexal
regions, and pelvic cul-de-sac.
It was necessary to proceed with endovaginal exam following the
transabdominal exam to visualize the endometrium and ovaries. Color
and duplex Doppler ultrasound was utilized to evaluate blood flow to
the ovaries.

[Series 1: us art/ven abd/pelv/scrotum doppler ltd · 79 acquisitions, 13 frames shown]
[im 1/79]
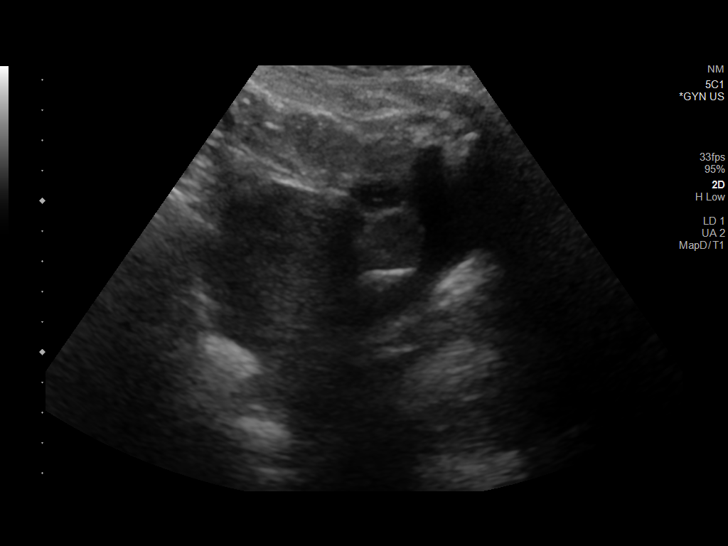
[im 7/79]
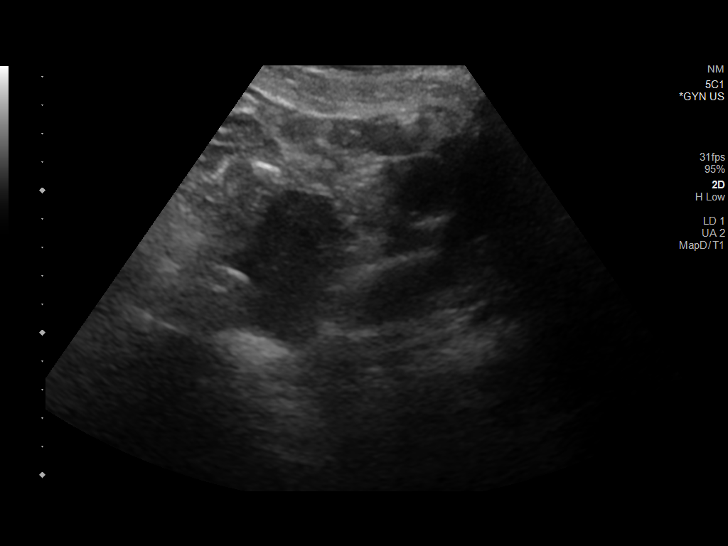
[im 14/79]
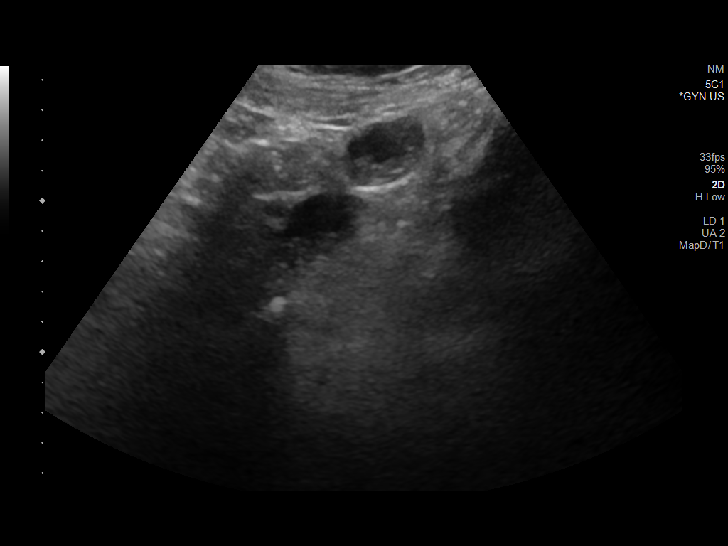
[im 20/79]
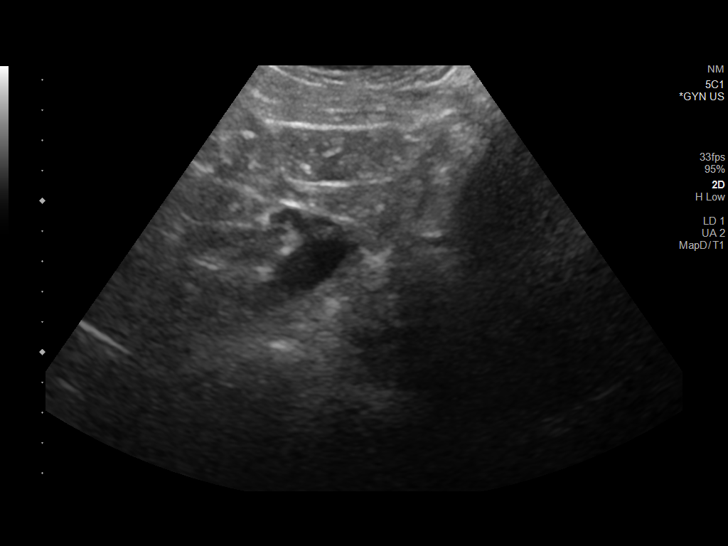
[im 27/79]
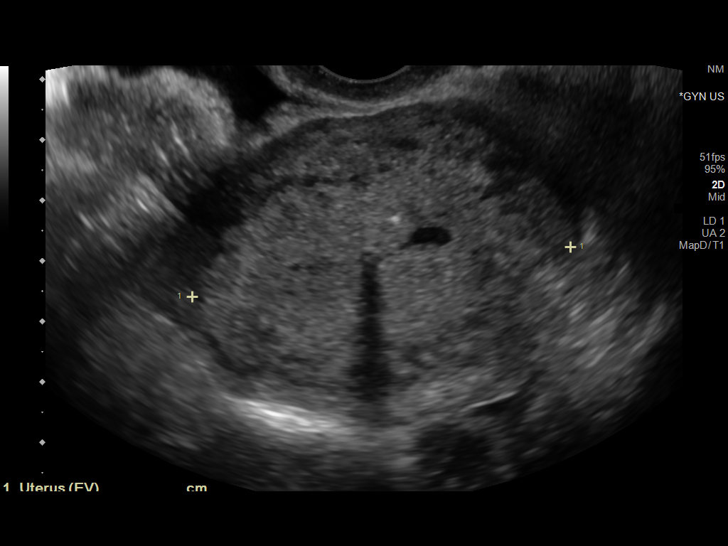
[im 33/79]
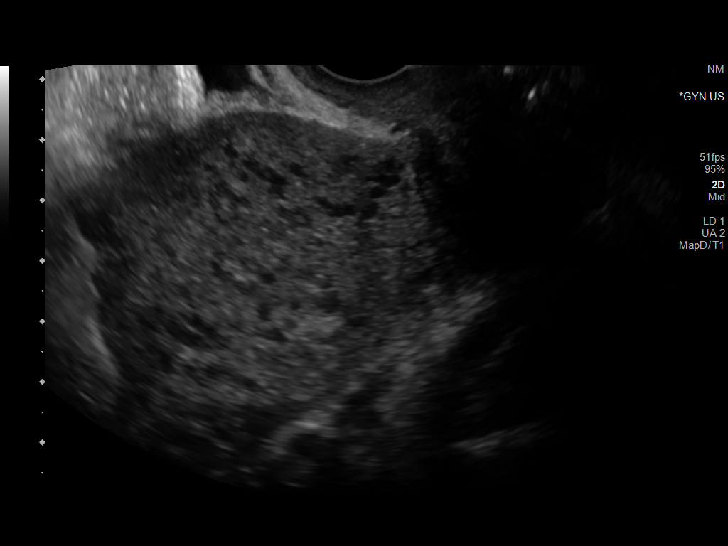
[im 40/79]
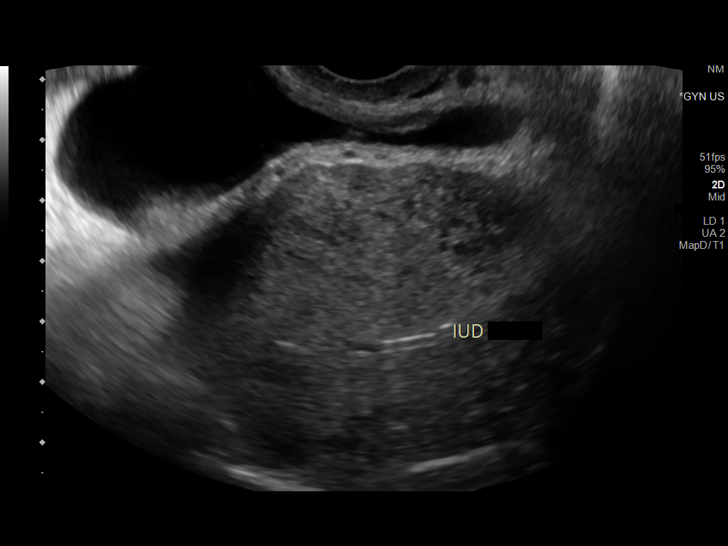
[im 46/79]
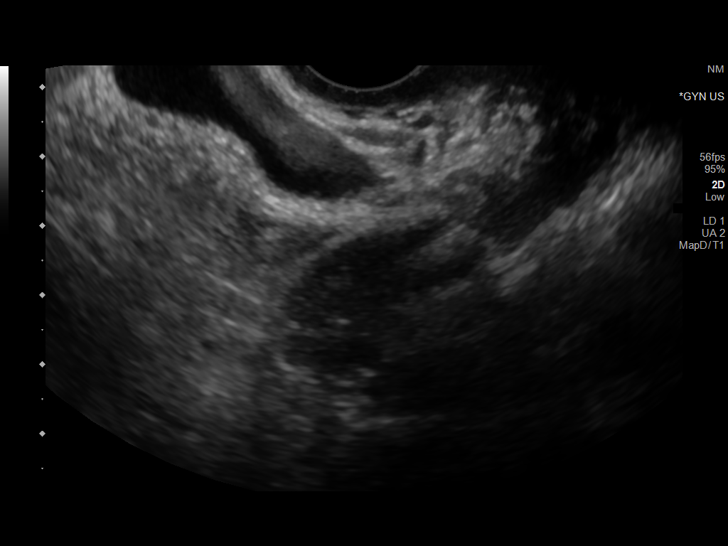
[im 53/79]
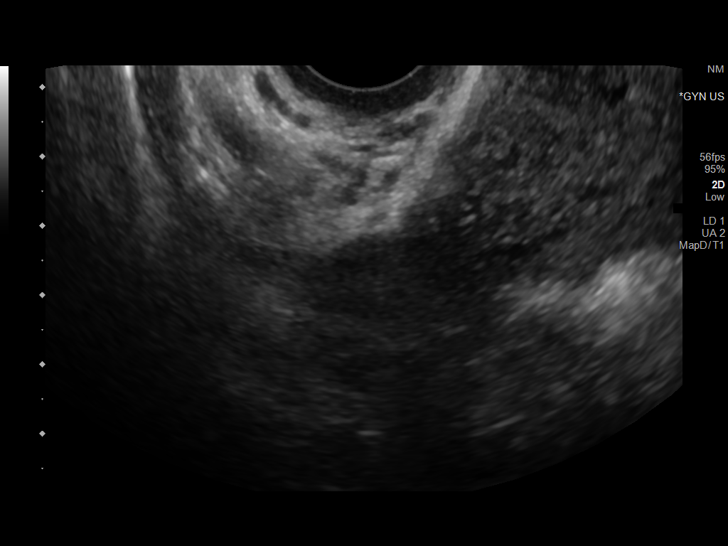
[im 59/79]
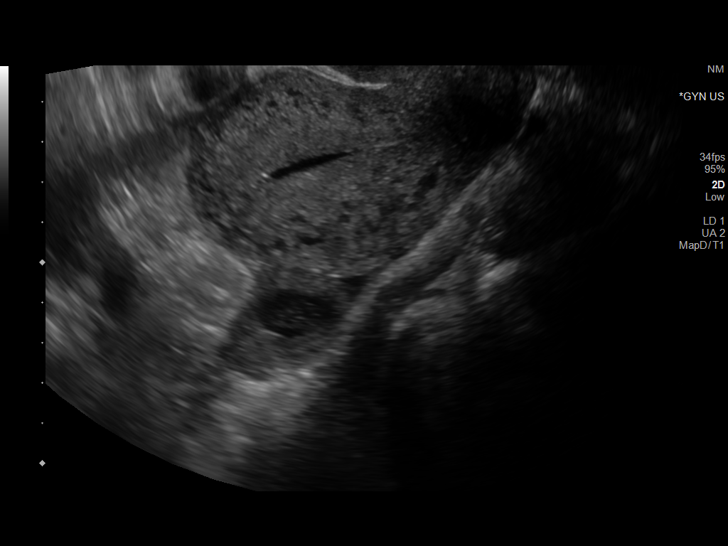
[im 66/79]
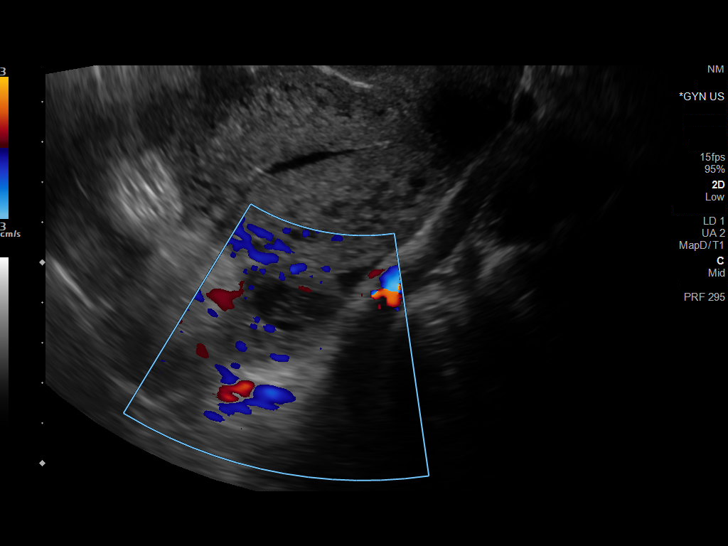
[im 72/79]
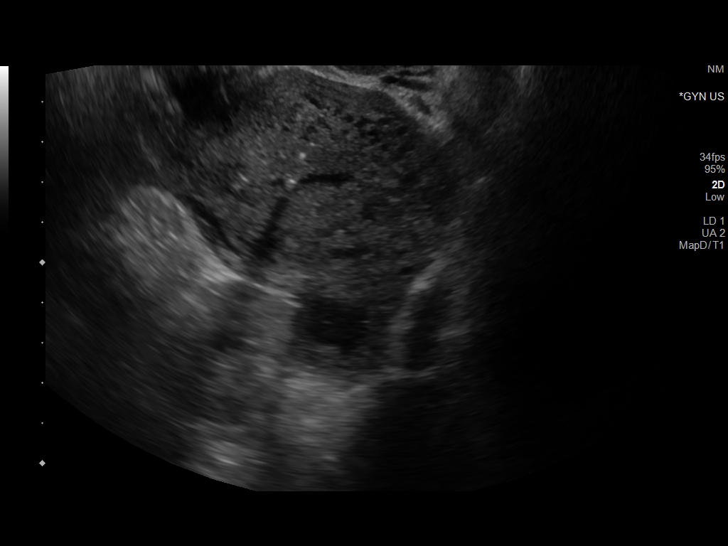
[im 79/79]
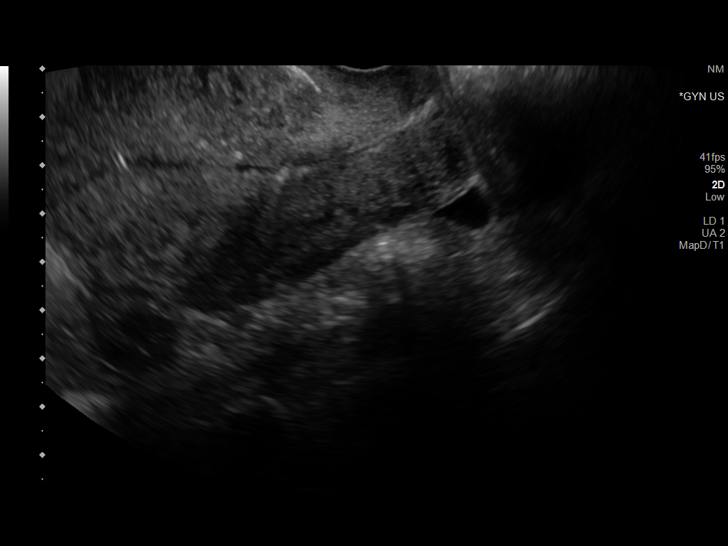

[13 of 25 positions shown; findings below may reference images not displayed]

FINDINGS: Uterus

Measurements: 9.4 x 5.5 x 6.3 cm = volume: 167 mL. The uterus is
anteverted and appears unremarkable.

Endometrium

Thickness: 7 mm. There is a small amount of fluid within the
endometrium. An intrauterine device is noted which appears in
appropriate positioning.

Right ovary

Measurements: 2.3 x 1.4 x 2.6 cm = volume: 4.3 mL. Normal
appearance/no adnexal mass.

Left ovary

Measurements: 4.5 x 2.9 x 2.7 cm = volume: 18.2 mL. There is a 2.0 x
1.2 x 1.4 cm complex cyst in the left ovary.

Pulsed Doppler evaluation of both ovaries demonstrates normal
low-resistance arterial and venous waveforms.

Other findings

No abnormal free fluid.
IMPRESSION: 1. Small amount of fluid within the endometrium.
2. Atrial tree device appears in appropriate positioning.
3. Small left ovarian complex/hemorrhagic cyst. Doppler detected
flow to both ovaries.

## 2021-04-18 IMAGING — CT CT ABD-PELV W/ CM
2 of 4 series · 15 of 46 positions shown, 17 images · IV contrast (APPLIED)
Comparison: CT of abdomen and pelvis 03/09/2012.

CLINICAL DATA: 33-year-old female with history of right lower
quadrant abdominal pain. Suspected acute appendicitis.

EXAM:
CT ABDOMEN AND PELVIS WITH CONTRAST
TECHNIQUE: Multidetector CT imaging of the abdomen and pelvis was performed
using the standard protocol following bolus administration of
intravenous contrast.
CONTRAST:  100mL OMNIPAQUE IOHEXOL 300 MG/ML  SOLN

[Series 3: abd/ pelvis 5.0 i30f 2 · axial · 0.85mm/px · z∈[+985,+1390]mm · 12 of 91 slices shown, 14 images]
[im 5/91  soft-tissue]
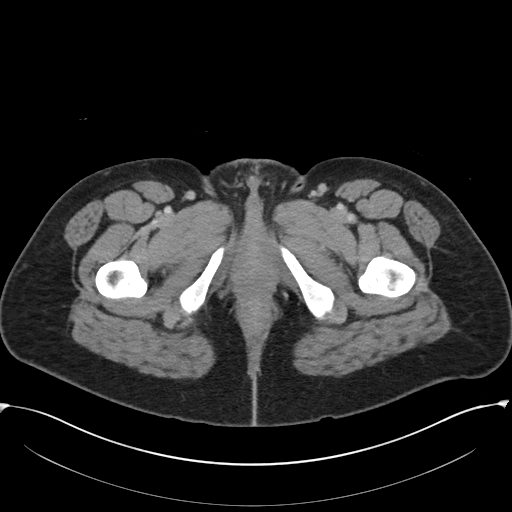
[im 5/91  bone]
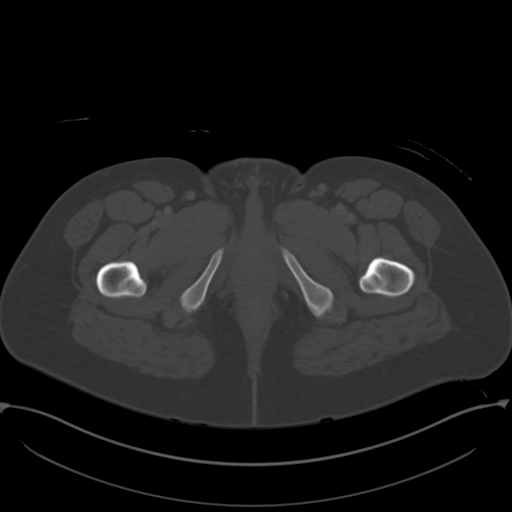
[im 13/91  soft-tissue]
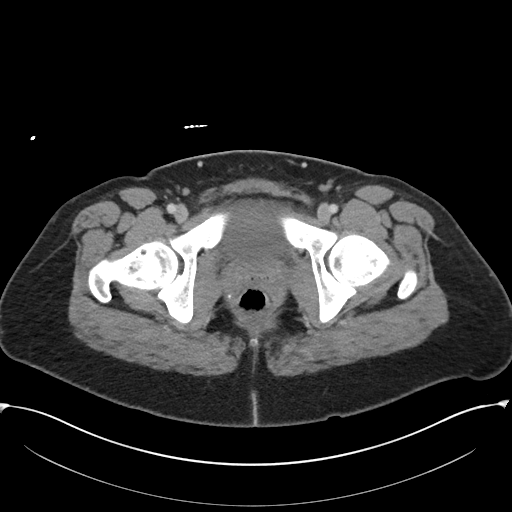
[im 22/91  soft-tissue]
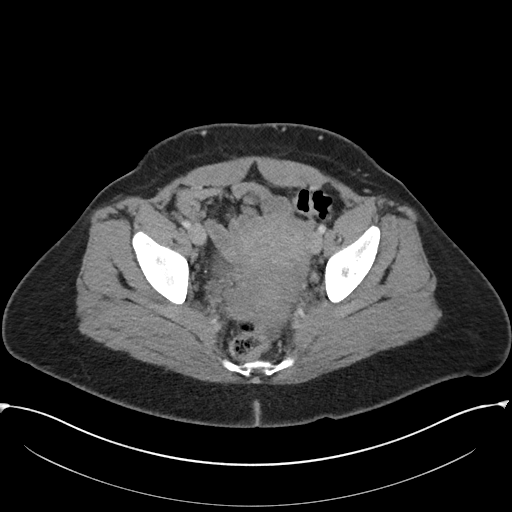
[im 26/91  soft-tissue]
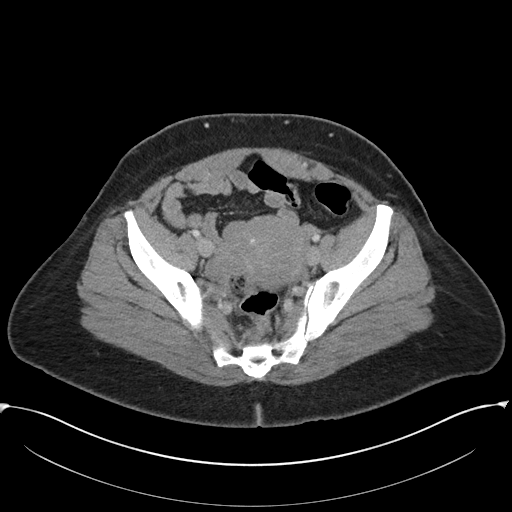
[im 35/91  soft-tissue]
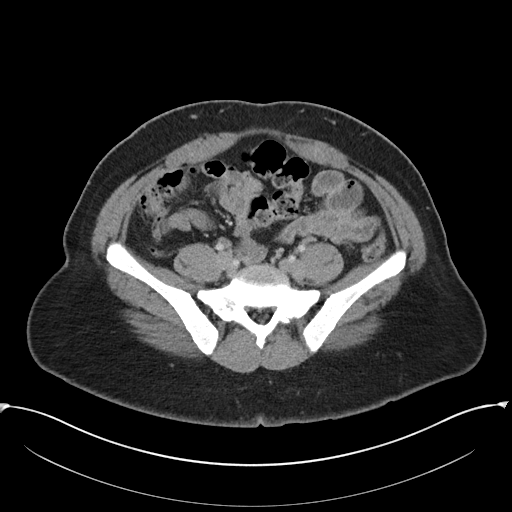
[im 43/91  soft-tissue]
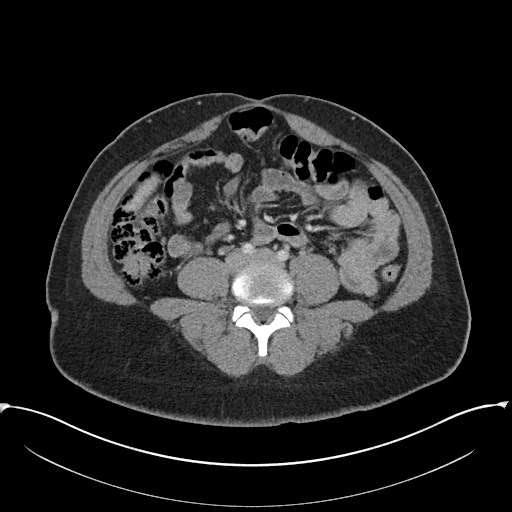
[im 48/91  soft-tissue]
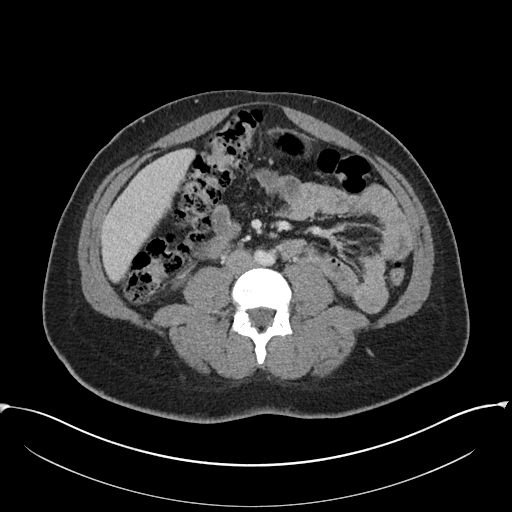
[im 56/91  soft-tissue]
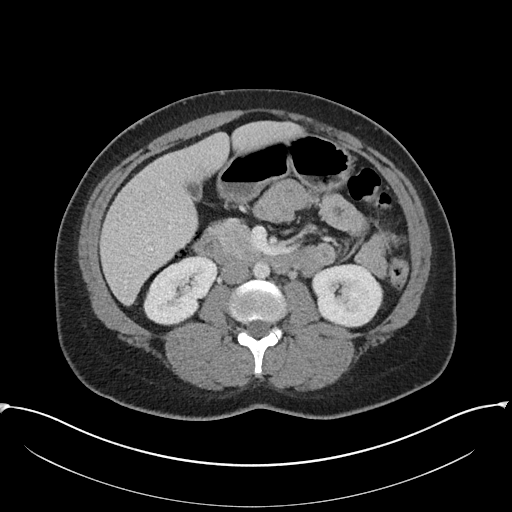
[im 65/91  soft-tissue]
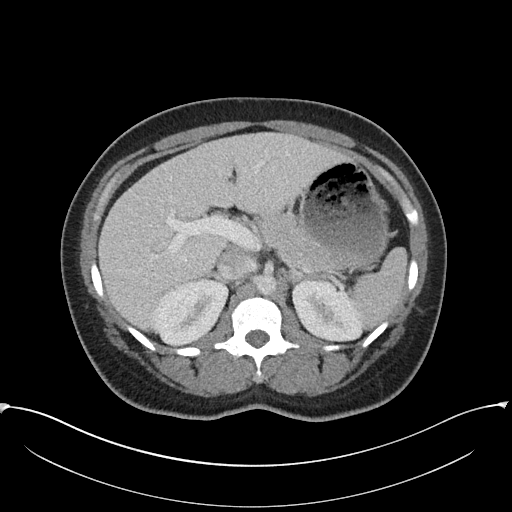
[im 65/91  bone]
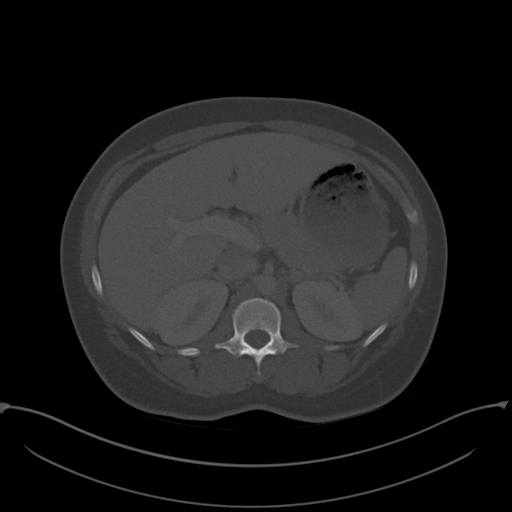
[im 69/91  soft-tissue]
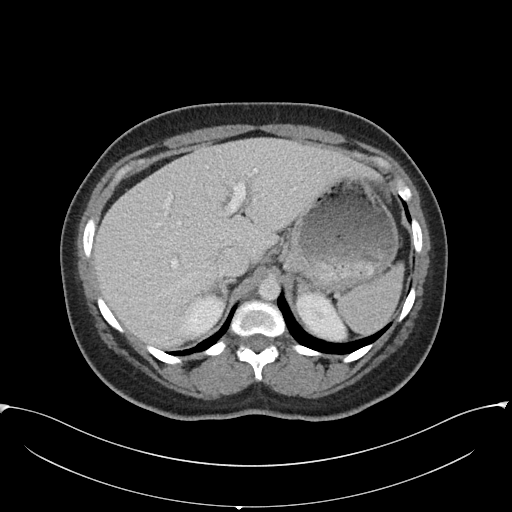
[im 78/91  soft-tissue]
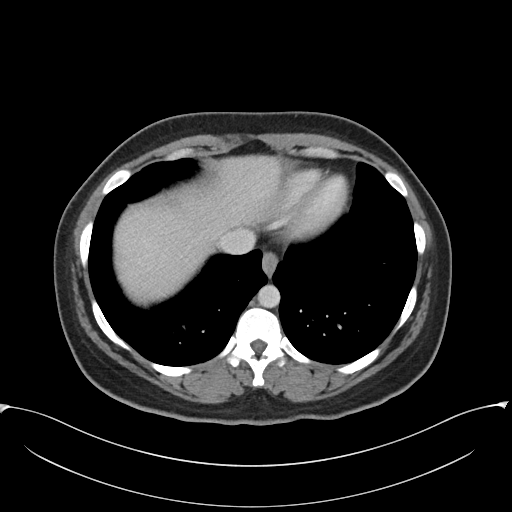
[im 86/91  soft-tissue]
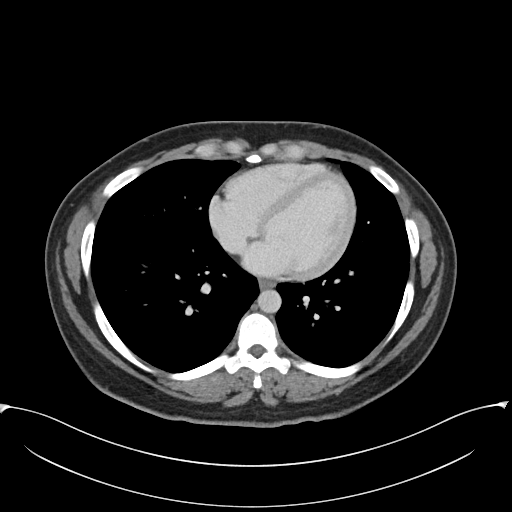

[Series 6: coronal soft tissue · coronal · 0.68mm/px · 3 of 101 slices shown]
[im 34/101  soft-tissue]
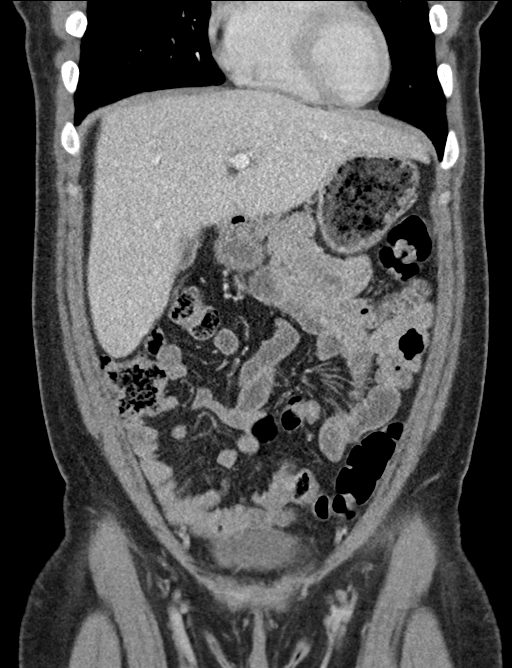
[im 45/101  soft-tissue]
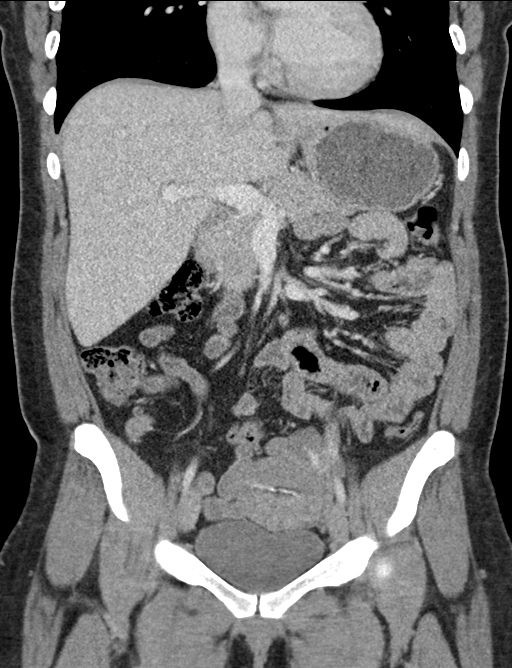
[im 56/101  soft-tissue]
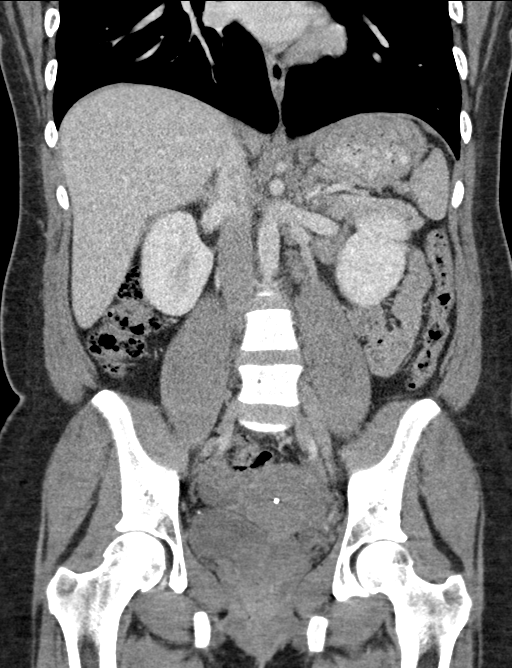

[15 of 46 positions shown; findings below may reference images not displayed]

FINDINGS: Lower chest: Unremarkable.

Hepatobiliary: No suspicious cystic or solid hepatic lesions. No
intra or extrahepatic biliary ductal dilatation. Gallbladder is
normal in appearance.

Pancreas: No pancreatic mass. No pancreatic ductal dilatation. No
pancreatic or peripancreatic fluid collections or inflammatory
changes.

Spleen: Unremarkable.

Adrenals/Urinary Tract: Right kidney and bilateral adrenal glands
are normal in appearance. Subcentimeter low-attenuation lesions in
the left kidney, too small to characterize, but statistically likely
to represent tiny cysts. Several nonobstructive calculi are noted
within the left renal collecting system measuring 2-3 mm in size. No
hydroureteronephrosis. Urinary bladder is normal in appearance.
Bilateral adrenal glands are normal in appearance.

Stomach/Bowel: Normal appearance of the stomach. No pathologic
dilatation of small bowel or colon. Normal appendix.

Vascular/Lymphatic: No significant atherosclerotic disease, aneurysm
or dissection noted in the abdominal or pelvic vasculature. No
lymphadenopathy noted in the abdomen or pelvis.

Reproductive: IUD present in the uterus. Uterus and right ovary are
otherwise unremarkable. 2.3 cm rim enhancing lesion in the left
adnexa, presumably a corpus luteum cyst.

Other: No significant volume of ascites.  No pneumoperitoneum.

Musculoskeletal: There are no aggressive appearing lytic or blastic
lesions noted in the visualized portions of the skeleton.
IMPRESSION: 1. Normal appendix. No acute findings are noted in the abdomen or
pelvis to account for the patient's symptoms.
2. Multiple nonobstructive calculi are present within the left renal
collecting system. No ureteral stones or findings of urinary tract
obstruction.
3. Incidental findings, as above.

## 2021-04-18 IMAGING — US US TRANSVAGINAL NON-OB
1 series · 13 of 25 positions shown · non-contrast
Comparison: CT abdomen pelvis dated 03/05/2019.

CLINICAL DATA: 33-year-old female with right lower quadrant
abdominal pain.

EXAM:
TRANSABDOMINAL AND TRANSVAGINAL ULTRASOUND OF PELVIS
DOPPLER ULTRASOUND OF OVARIES
TECHNIQUE: Both transabdominal and transvaginal ultrasound examinations of the
pelvis were performed. Transabdominal technique was performed for
global imaging of the pelvis including uterus, ovaries, adnexal
regions, and pelvic cul-de-sac.
It was necessary to proceed with endovaginal exam following the
transabdominal exam to visualize the endometrium and ovaries. Color
and duplex Doppler ultrasound was utilized to evaluate blood flow to
the ovaries.

[Series 1: us transvaginal non-ob · 79 acquisitions, 13 frames shown]
[im 1/79]
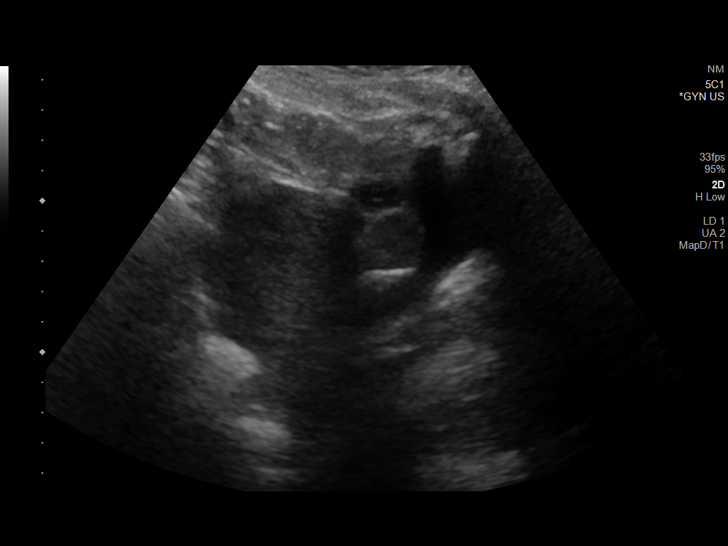
[im 7/79]
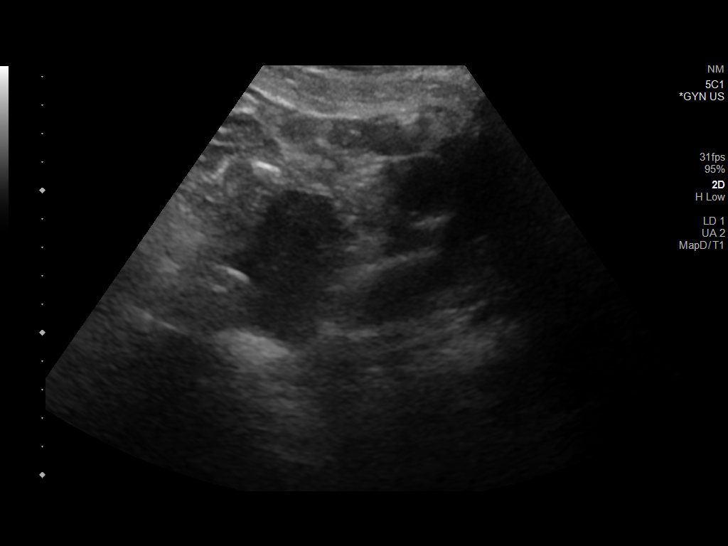
[im 14/79]
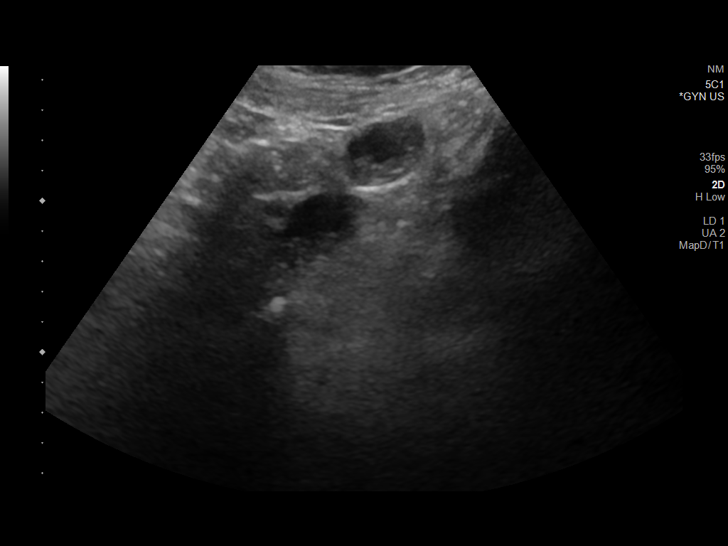
[im 20/79]
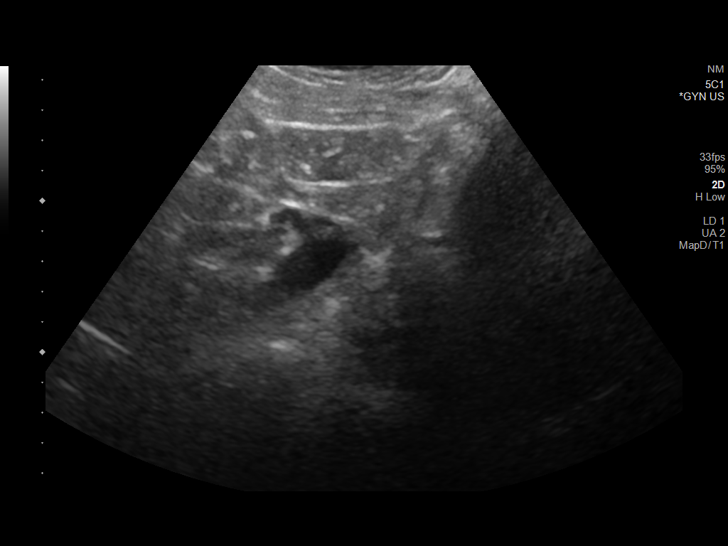
[im 27/79]
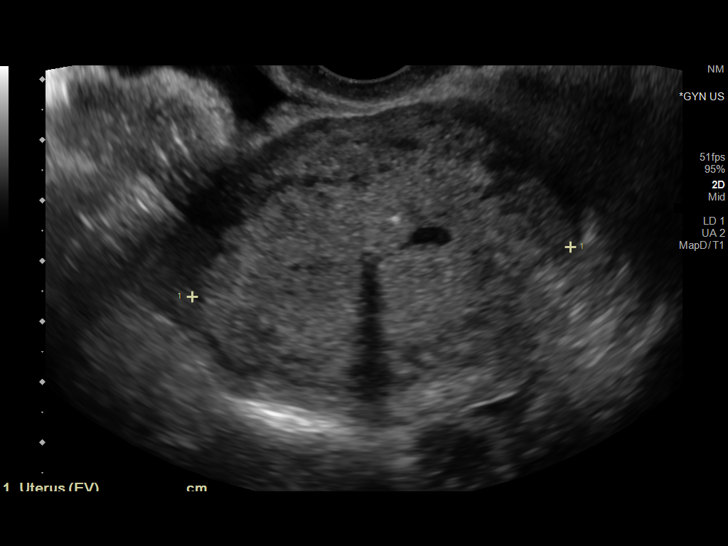
[im 33/79]
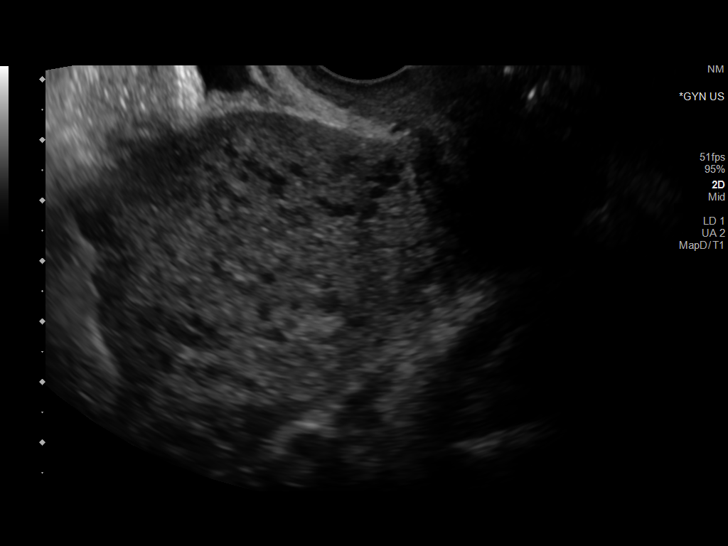
[im 40/79]
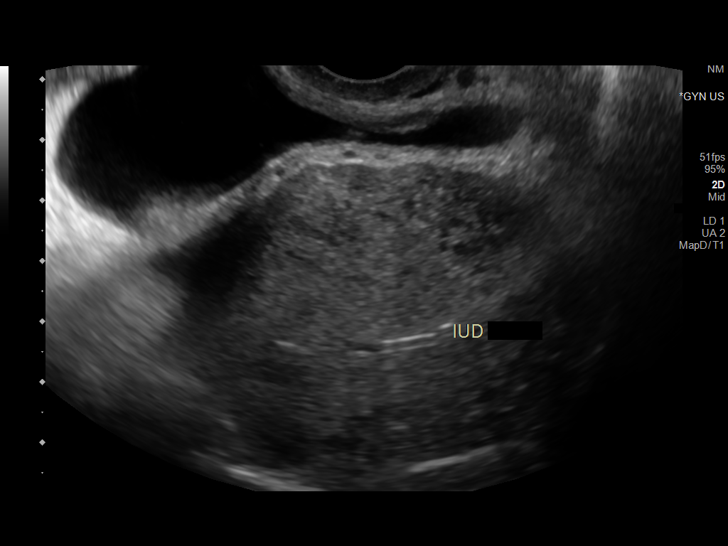
[im 46/79]
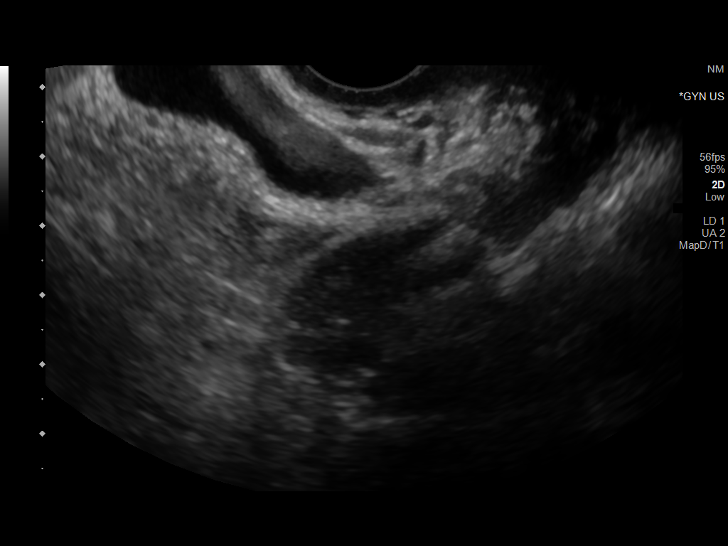
[im 53/79]
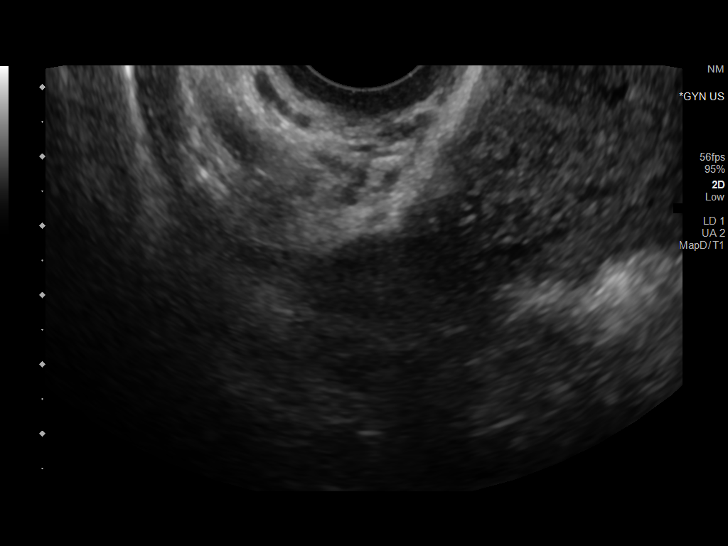
[im 59/79]
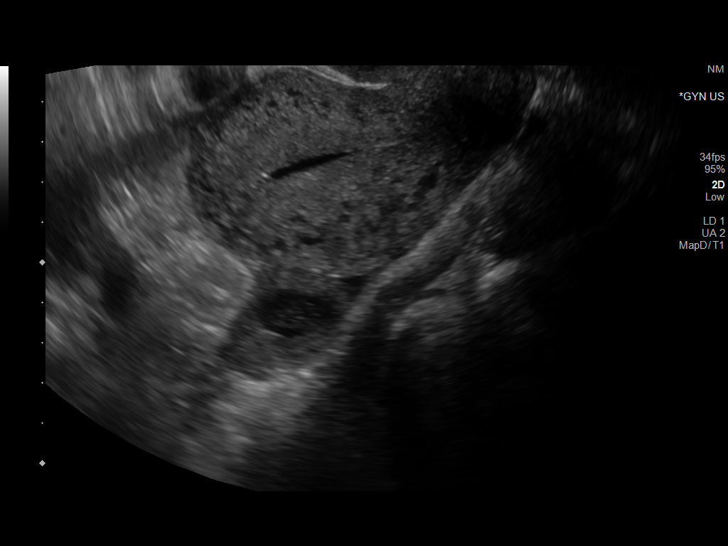
[im 66/79]
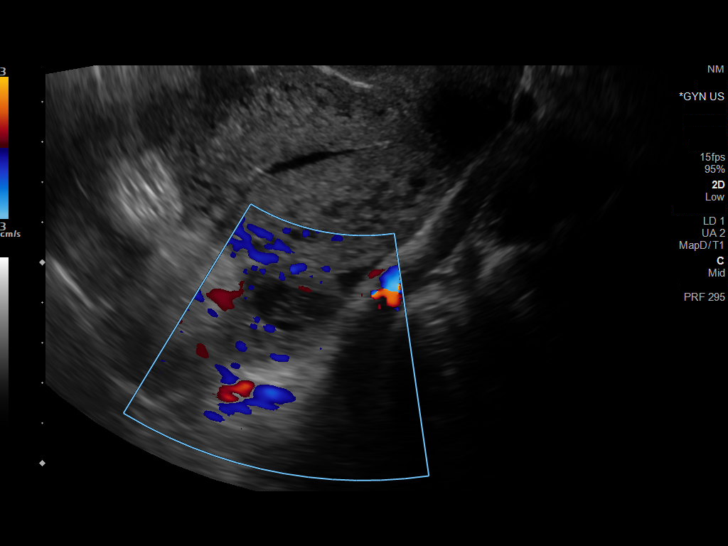
[im 72/79]
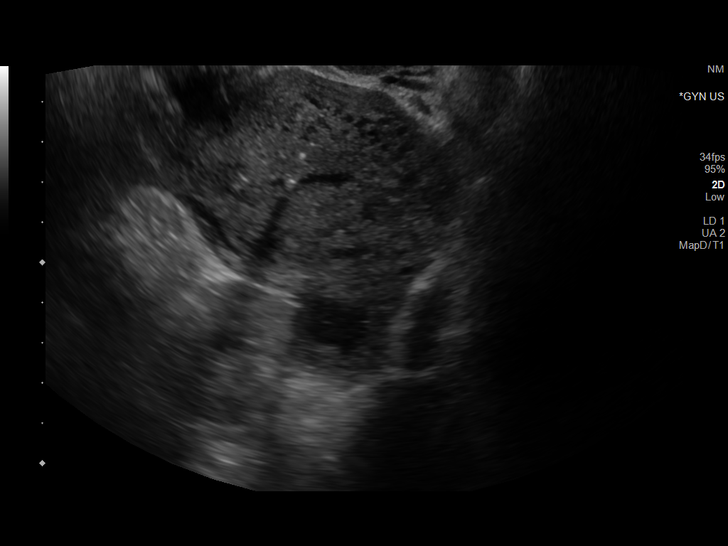
[im 79/79]
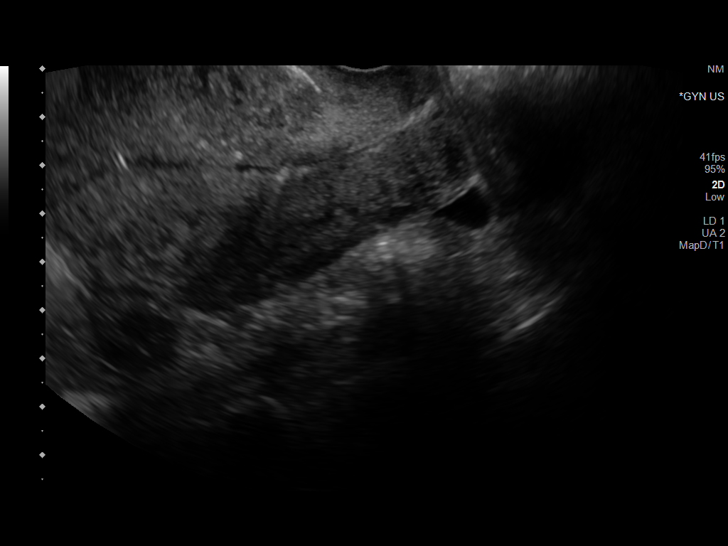

[13 of 25 positions shown; findings below may reference images not displayed]

FINDINGS: Uterus

Measurements: 9.4 x 5.5 x 6.3 cm = volume: 167 mL. The uterus is
anteverted and appears unremarkable.

Endometrium

Thickness: 7 mm. There is a small amount of fluid within the
endometrium. An intrauterine device is noted which appears in
appropriate positioning.

Right ovary

Measurements: 2.3 x 1.4 x 2.6 cm = volume: 4.3 mL. Normal
appearance/no adnexal mass.

Left ovary

Measurements: 4.5 x 2.9 x 2.7 cm = volume: 18.2 mL. There is a 2.0 x
1.2 x 1.4 cm complex cyst in the left ovary.

Pulsed Doppler evaluation of both ovaries demonstrates normal
low-resistance arterial and venous waveforms.

Other findings

No abnormal free fluid.
IMPRESSION: 1. Small amount of fluid within the endometrium.
2. Atrial tree device appears in appropriate positioning.
3. Small left ovarian complex/hemorrhagic cyst. Doppler detected
flow to both ovaries.

## 2021-05-05 ENCOUNTER — Ambulatory Visit (HOSPITAL_COMMUNITY)
Admission: EM | Admit: 2021-05-05 | Discharge: 2021-05-05 | Disposition: A | Discharge status: Home / Self Care | Payer: Medicaid Other | Attending: Internal Medicine | Admitting: Internal Medicine

## 2021-05-05 ENCOUNTER — Encounter (HOSPITAL_COMMUNITY): Payer: Self-pay

## 2021-05-05 DIAGNOSIS — L84 Corns and callosities: Secondary | ICD-10-CM | POA: Diagnosis not present

## 2021-05-05 NOTE — ED Triage Notes (Signed)
C/o right foot pain and swelling for several months.  ?

## 2021-05-05 NOTE — Discharge Instructions (Addendum)
Please make an appointment with the podiatry team to have further evaluation. ?

## 2021-05-08 NOTE — ED Provider Notes (Signed)
?[facility] ? ? ? ?CSN: [id number] ?Arrival date & time: 05/05/21  1937 ? ? ?  ? ?History   ?Chief Complaint ?No chief complaint on file. ? ? ?HPI ?Dawn Brock is a 36 y.o. female comes to the urgent care with painful callus over the right fifth toe.  Symptoms have been ongoing for several months.  Patient would like it addressed.  No bleeding, no redness, pain is of moderate severity.  He has not tried any over-the-counter medication..  ? ?HPI ? ?Past Medical History:  ?Diagnosis Date  ? Abnormal Pap smear   ? Anemia   ? Gastritis   ? Hx of chlamydia infection   ? Vaginal Pap smear, abnormal   ? ? ?Patient Active Problem List  ? Diagnosis Date Noted  ? H/O LEEP 08/21/2016  ? Headache 08/21/2016  ? Mild tetrahydrocannabinol (THC) abuse 08/21/2016  ? History of substance abuse ([place]) 08/21/2016  ? 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 Alcohol dependence ([place]) 03/12/2012  ? Cannabis dependence ([place]) 03/12/2012  ? Executive function deficit 03/11/2012  ?  Class: Acute  ? Cigarette smoker 03/10/2012  ? Duodenitis with nausea, vomiting, diarrhea and abdominal pain 03/10/2012  ? ? ?Past Surgical History:  ?Procedure Laterality Date  ? HERNIA REPAIR    ? As an infant  ? LEEP    ? ? ?OB History   ? ? Gravida  ?4  ? Para  ?4  ? Term  ?4  ? Preterm  ?0  ? AB  ?0  ? Living  ?4  ?  ? ? SAB  ?0  ? IAB  ?0  ? Ectopic  ?0  ? Multiple  ?0  ? Live Births  ?4  ?   ?  ?  ? ? ? ?Home Medications   ? ?Prior to Admission medications   ?Not on File  ? ? ?Family History ?Family History  ?Problem Relation Age of Onset  ? Hypertension Mother   ? Fibromyalgia Mother   ? Cirrhosis Father   ? Kidney disease Father   ? Asthma Brother   ? Birth defects Son   ?     hirschprungs  ? ? ?Social History ?Social History  ? ?Tobacco Use  ? Smoking status: Every Day  ?  Packs/day: 0.50  ?  Years: 14.00  ?  Pack years: 7.00  ?  Types: Cigarettes  ? Smokeless tobacco: Never  ?Vaping Use  ? Vaping Use: Never used   ?Substance Use Topics  ? Alcohol use: Yes  ?  Comment: Not since June 2014  ? Drug use: Yes  ?  Types: Cocaine, Marijuana  ?  Comment: Patient denies any recent use of Marijuna and cocaine as of 07/02/12    LAST SMOKED  MARIJUANA-   LAST WEEK LAST USED COCAINE-    2015  ? ? ? ?Allergies   ?Tramadol ? ? ?Review of Systems ?Review of Systems ?As per HPI ? ?Physical Exam ?Triage Vital Signs ?ED Triage Vitals [05/05/21 2012]  ?Enc Vitals Group  ?   BP 131/80  ?   Pulse Rate 78  ?   Resp 16  ?   Temp 98.2 ?F (36.8 ?C)  ?   Temp Source Oral  ?   SpO2 100 %  ?   Weight   ?   Height   ?   Head Circumference   ?   Peak Flow   ?   Pain Score 9  ?  Pain Loc   ?   Pain Edu?   ?   Excl. in [place]?   ? ?No data found. ? ?Updated Vital Signs ?BP 131/80 (BP Location: Left Arm)   Pulse 78   Temp 98.2 ?F (36.8 ?C) (Oral)   Resp 16   LMP 05/05/2021   SpO2 100%  ? ?Visual Acuity ?Right Eye Distance:   ?Left Eye Distance:   ?Bilateral Distance:   ? ?Right Eye Near:   ?Left Eye Near:    ?Bilateral Near:    ? ?Physical Exam ?Skin: ?   General: Skin is warm.  ?   Comments: Corn on the lateral aspect of the distal phalanx of the right foot.  No fluctuance, redness or purulent discharge.  ? ? ? ?UC Treatments / Results  ?Labs ?(all labs ordered are listed, but only abnormal results are displayed) ?Labs Reviewed - No data to display ? ?EKG ? ? ?Radiology ?No results found. ? ?Procedures ?Procedures (including critical care time) ? ?Medications Ordered in UC ?Medications - No data to display ? ?Initial Impression / Assessment and Plan / UC Course  ?I have reviewed the triage vital signs and the nursing notes. ? ?Pertinent labs & imaging results that were available during my care of the patient were reviewed by me and considered in my medical decision making (see chart for details). ? ?  ? ?1.  Corns of the right little toe: ?Patient is advised to follow-up with podiatry.  Information for podiatry practice has been given to the patient.   Patient will call and make an appointment to be seen by a podiatrist. ? ?Final Clinical Impressions(s) / UC Diagnoses  ? ?Final diagnoses:  ?Corns of multiple toes  ? ? ? ?Discharge Instructions   ? ?  ?Please make an appointment with the podiatry team to have further evaluation. ? ? ? ?ED Prescriptions   ?None ?  ? ?PDMP not reviewed this encounter. ?  ?Chase Picket, MD ?05/08/21 1606 ? ?

## 2021-05-15 ENCOUNTER — Ambulatory Visit (INDEPENDENT_AMBULATORY_CARE_PROVIDER_SITE_OTHER): Payer: Medicaid Other | Admitting: Podiatry

## 2021-05-15 DIAGNOSIS — M2041 Other hammer toe(s) (acquired), right foot: Secondary | ICD-10-CM | POA: Diagnosis not present

## 2021-05-15 DIAGNOSIS — L84 Corns and callosities: Secondary | ICD-10-CM | POA: Diagnosis not present

## 2021-05-15 NOTE — Patient Instructions (Signed)
Look for urea 40% cream or ointment and apply to the thickened dry skin / calluses. This can be bought over the counter, at a pharmacy or online such as [employer]. ? ? ?More silicone pads can be purchased from: ? ?[email or web address] ? ?Or [place]. The name of the pads are: digital corn pads (tan) and silicone toe cap (clear) ? ?

## 2021-05-19 NOTE — Progress Notes (Signed)
?  Subjective:  ?Patient ID: Dawn Brock, female    DOB: [birth year],  MRN: [id number] ? ?Chief Complaint  ?Patient presents with  ? Toe Pain  ?  (NP) follow up from urgent care, R pinky toe corn, pain  ? ? ?36 y.o. female presents with the above complaint. History confirmed with patient.  ? ?Objective:  ?Physical Exam: ?warm, good capillary refill, no trophic changes or ulcerative lesions, normal DP and PT pulses, normal sensory exam, and severely painful corn on the dorsal lateral PIPJ of the fifth toe which is contracted in adductovarus position. ? ?Assessment:  ? ?1. Callus of foot   ?2. Hammertoe of right foot   ? ? ? ?Plan:  ?Patient was evaluated and treated and all questions answered. ? ?Reviewed etiology and treatment options for hammertoe deformity as well as the painful corn in detail with her.  We discussed nonsurgical and surgical treatment today.  Debrided the lesion as a courtesy today I discussed with her that routine debridement of this is not a covered service for her but she could do this of pocket or with a pedicurist.  I did spend silicone toe crest and she will return as needed.  Recommended urea cream for the callused areas as well. ? ?Return if symptoms worsen or fail to improve.  ? ?

## 2021-06-17 ENCOUNTER — Ambulatory Visit: Payer: Medicaid Other | Admitting: Podiatry

## 2021-06-22 ENCOUNTER — Encounter (HOSPITAL_COMMUNITY): Payer: Self-pay | Admitting: Emergency Medicine

## 2021-06-22 ENCOUNTER — Ambulatory Visit (HOSPITAL_COMMUNITY)
Admission: EM | Admit: 2021-06-22 | Discharge: 2021-06-22 | Disposition: A | Discharge status: Home / Self Care | Payer: Medicaid Other | Attending: Internal Medicine | Admitting: Internal Medicine

## 2021-06-22 DIAGNOSIS — L02412 Cutaneous abscess of left axilla: Secondary | ICD-10-CM

## 2021-06-22 MED ORDER — KETOROLAC TROMETHAMINE 30 MG/ML IJ SOLN
INTRAMUSCULAR | Status: AC
  Filled 2021-06-22: qty 1

## 2021-06-22 MED ORDER — IBUPROFEN 600 MG PO TABS: 600.0000 mg | ORAL_TABLET | Freq: Four times a day (QID) | ORAL | 0 refills | Status: DC | PRN

## 2021-06-22 MED ORDER — LIDOCAINE-EPINEPHRINE 1 %-1:100000 IJ SOLN
INTRAMUSCULAR | Status: AC
  Filled 2021-06-22: qty 1

## 2021-06-22 MED ORDER — KETOROLAC TROMETHAMINE 30 MG/ML IJ SOLN
30.0000 mg | Freq: Once | INTRAMUSCULAR | Status: AC
  Administered 2021-06-22: 30 mg via INTRAMUSCULAR

## 2021-06-22 MED ORDER — AMOXICILLIN-POT CLAVULANATE 875-125 MG PO TABS: 1.0000 | ORAL_TABLET | Freq: Two times a day (BID) | ORAL | 0 refills | Status: DC

## 2021-06-22 NOTE — Discharge Instructions (Addendum)
Take Augmentin twice daily for the next 7 days for infection due to abscess.  You may take ibuprofen 600 mg every 6 hours starting tomorrow.  Do not take any ibuprofen tonight since you were given the ketorolac injection for pain in the clinic today.  I drained your abscess and left the wound open so that it can continue to drain.  Gently massage the area twice daily with a warm washcloth especially when you are in the shower to promote drainage from the wound.  If you notice worsening signs of infection such as worsening redness, swelling, or drainage after 2 to 3 days of being on the antibiotic, please return to urgent care for further evaluation and reassessment.   Change the dressing to your left armpit twice daily with nonstick gauze.  Do not apply any lotions, powders, or ointments to the area.  Do not wear deodorant to the left armpit for the next week to prevent moisture that could cause worsening infection.   Call the general surgeon listed on your discharge paperwork if you would like to have the cyst surgically removed to prevent further infection/abscess.  If you develop any new or worsening symptoms or do not improve in the next 2 to 3 days, please return.  If your symptoms are severe, please go to the emergency room.  Follow-up with your primary care provider for further evaluation and management of your symptoms as well as ongoing wellness visits.  I hope you feel better!

## 2021-06-22 NOTE — ED Provider Notes (Signed)
[facility]    CSN: [id number] Arrival date & time: 06/22/21  1607      History   Chief Complaint Chief Complaint  Patient presents with   Abscess    HPI Dawn Brock is a 36 y.o. female.   Patient presents to urgent care for evaluation of the abscess underneath her left armpit that she first noticed 3 days ago on Friday, June 19, 2021.  Abscess is grown in size and pain since then.  Abscess is warm, erythematous, and very painful to palpation.  She has not taken any medications for her pain and currently rates her pain at a 10 on a scale of 0-10.  Abscess is fluctuant and approximately 5cm x 2.5cm inferior to patient's left axilla.  She states that she has had abscesses in the same area 4-5 times in the past that have had to be drained due to swelling and infection.  She would like consult for general surgeon to surgically remove the cyst that is allowing the abscess to form repeatedly.     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[place]) 02/22/2013   Alcohol dependence ([place]) 03/12/2012   Cannabis dependence ([place]) 03/12/2012   Executive function deficit 03/11/2012    Class: Acute   Cigarette smoker 03/10/2012   Duodenitis with nausea, vomiting, diarrhea and abdominal pain 03/10/2012    Past Surgical History:  Procedure Laterality Date   HERNIA REPAIR     As an infant   LEEP      OB History     Gravida  4   Para  4   Term  4   Preterm  0   AB  0   Living  4      SAB  0   IAB  0   Ectopic  0   Multiple  0   Live Births  4            Home Medications    Prior to Admission  medications   Medication Sig Start Date End Date Taking? Authorizing Provider  amoxicillin-clavulanate (AUGMENTIN) 875-125 MG tablet Take 1 tablet by mouth every 12 (twelve) hours. 06/22/21  Yes Talbot Grumbling, FNP  ibuprofen (ADVIL) 600 MG tablet Take 1 tablet (600 mg total) by mouth every 6 (six) hours as needed. 06/22/21  Yes Niara Bunker, Stasia Cavalier, FNP    Family History Family History  Problem Relation Age of Onset   Hypertension Mother    Fibromyalgia Mother    Cirrhosis Father    Kidney disease Father    Asthma Brother    Birth defects Son        hirschprungs    Social History Social History   Tobacco Use   Smoking status: Every Day    Packs/day: 0.50    Years: 14.00    Total pack years: 7.00    Types: Cigarettes   Smokeless tobacco: Never  Vaping Use   Vaping Use: Never used  Substance Use Topics   Alcohol use: Yes    Comment: Not since June 2014  Drug use: Yes    Types: Cocaine, Marijuana    Comment: Patient denies any recent use of Marijuna and cocaine as of 07/02/12    LAST SMOKED  MARIJUANA-   LAST WEEK LAST USED COCAINE-    2015     Allergies   Tramadol   Review of Systems Review of Systems Per HPI  Physical Exam Triage Vital Signs ED Triage Vitals  Enc Vitals Group     BP 06/22/21 1656 (!) 142/95     Pulse Rate 06/22/21 1656 88     Resp 06/22/21 1656 20     Temp 06/22/21 1656 97.9 F (36.6 C)     Temp Source 06/22/21 1656 Oral     SpO2 06/22/21 1656 97 %     Weight --      Height --      Head Circumference --      Peak Flow --      Pain Score 06/22/21 1655 10     Pain Loc --      Pain Edu? --      Excl. in [place]? --    No data found.  Updated Vital Signs BP (!) 142/95 (BP Location: Right Arm)   Pulse 88   Temp 97.9 F (36.6 C) (Oral)   Resp 20   LMP 05/29/2021   SpO2 97%   Visual Acuity Right Eye Distance:   Left Eye Distance:   Bilateral Distance:    Right Eye Near:   Left Eye Near:    Bilateral Near:     Physical  Exam Vitals and nursing note reviewed.  Constitutional:      General: She is not in acute distress.    Appearance: She is well-developed.  HENT:     Head: Normocephalic and atraumatic.     Right Ear: External ear normal.     Left Ear: External ear normal.  Eyes:     Conjunctiva/sclera: Conjunctivae normal.  Cardiovascular:     Rate and Rhythm: Normal rate and regular rhythm.     Heart sounds: No murmur heard. Pulmonary:     Effort: Pulmonary effort is normal. No respiratory distress.     Breath sounds: Normal breath sounds.  Abdominal:     Palpations: Abdomen is soft.     Tenderness: There is no abdominal tenderness.  Musculoskeletal:        General: No swelling.     Cervical back: Neck supple.  Skin:    General: Skin is warm and dry.     Capillary Refill: Capillary refill takes less than 2 seconds.     Comments: Abscess noted to patient's inferior left axilla area.  Abscess is warm, erythematous, and very tender to palpation.  Abscess is approximately 5cm x 2.5cm in size and is fluctuant.   Neurological:     Mental Status: She is alert.  Psychiatric:        Mood and Affect: Mood normal.      UC Treatments / Results  Labs (all labs ordered are listed, but only abnormal results are displayed) Labs Reviewed - No data to display  EKG   Radiology No results found.  Procedures Incision and Drainage  Date/Time: 06/22/2021 5:49 PM  Performed by: Talbot Grumbling, FNP Authorized by: Talbot Grumbling, FNP   Consent:    Consent obtained:  Verbal   Consent given by:  Patient   Risks, benefits, and alternatives were discussed: yes     Risks discussed:  Bleeding, incomplete  drainage, pain, infection and damage to other organs   Alternatives discussed:  No treatment and delayed treatment Universal protocol:    Patient identity confirmed:  Verbally with patient Location:    Type:  Abscess   Size:  5cm by 2.5cm   Location:  Upper extremity   Upper extremity  location:  Arm   Arm location:  L upper arm (left axilla) Pre-procedure details:    Skin preparation:  Povidone-iodine Anesthesia:    Anesthesia method:  Local infiltration   Local anesthetic:  Lidocaine 1% WITH epi Procedure type:    Complexity:  Simple Procedure details:    Incision types:  Single straight   Incision depth:  Dermal   Wound management:  Irrigated with saline   Drainage:  Purulent and bloody   Drainage amount:  Moderate   Wound treatment:  Wound left open   Packing materials:  None Post-procedure details:    Procedure completion:  Tolerated well, no immediate complications  (including critical care time)  Medications Ordered in UC Medications  ketorolac (TORADOL) 30 MG/ML injection 30 mg (has no administration in time range)    Initial Impression / Assessment and Plan / UC Course  I have reviewed the triage vital signs and the nursing notes.  Pertinent labs & imaging results that were available during my care of the patient were reviewed by me and considered in my medical decision making (see chart for details).  Patient is a 36 year old female presenting to urgent care with abscess to her left axilla.  *** Final Clinical Impressions(s) / UC Diagnoses   Final diagnoses:  Abscess of left axilla     Discharge Instructions      Take Augmentin twice daily for the next 7 days for infection due to abscess.  You may take ibuprofen 600 mg every 6 hours starting tomorrow.  Do not take any ibuprofen tonight since you were given the ketorolac injection for pain in the clinic today.  I drained your abscess and left the wound open so that it can continue to drain.  Gently massage the area twice daily with a warm washcloth especially when you are in the shower to promote drainage from the wound.  If you notice worsening signs of infection such as worsening redness, swelling, or drainage after 2 to 3 days of being on the antibiotic, please return to urgent care for  further evaluation and reassessment.   Change the dressing to your left armpit twice daily with nonstick gauze.  Do not apply any lotions, powders, or ointments to the area.  Do not wear deodorant to the left armpit for the next week to prevent moisture that could cause worsening infection.   If you develop any new or worsening symptoms or do not improve in the next 2 to 3 days, please return.  If your symptoms are severe, please go to the emergency room.  Follow-up with your primary care provider for further evaluation and management of your symptoms as well as ongoing wellness visits.  I hope you feel better!     ED Prescriptions     Medication Sig Dispense Auth. Provider   amoxicillin-clavulanate (AUGMENTIN) 875-125 MG tablet Take 1 tablet by mouth every 12 (twelve) hours. 14 tablet Joella Prince M, FNP   ibuprofen (ADVIL) 600 MG tablet Take 1 tablet (600 mg total) by mouth every 6 (six) hours as needed. 30 tablet Talbot Grumbling, FNP      PDMP not reviewed this encounter.

## 2021-06-22 NOTE — ED Triage Notes (Signed)
Pt reports left axilla abscess since Friday. Pt reports pain and swelling.

## 2021-06-29 ENCOUNTER — Telehealth: Payer: Medicaid Other | Admitting: Physician Assistant

## 2021-06-29 DIAGNOSIS — N760 Acute vaginitis: Secondary | ICD-10-CM

## 2021-06-29 MED ORDER — FLUCONAZOLE 150 MG PO TABS: ORAL_TABLET | ORAL | 0 refills | Status: DC

## 2021-06-29 NOTE — Patient Instructions (Signed)
Vaginal Hygiene Healthy practices for vaginal hygiene - Avoid pajamas. A robe / nightgown allows better air circulation. Sleep without panties / panties whenever possible. - Wear cotton panties / panties during the day. After washing the panties / panties, rinse them twice to avoid irritating residues. Do not use fabric softeners for panties and / or swimsuits. - Avoid tights, leotard, leggings, tight pants or other tight attire. Loose skirts and pants allow air to circulate. - Avoid the pantiprotector. Better use tampons or sanitary pads. It is beneficial to bathe with warm water every day: - Soak in clean water (without soap) for 10 to 15 minutes. It has not been specifically studied that adding vinegar or baking soda / baking soda to water is of benefit and may not be better than water alone. - Use soap to wash the other regions apart from the genital area before leaving the tub. Limit the use of soap in the genital areas. Use soaps without fragrance. - Rinse the genital area and pat dry. A hair dryer can only be used if cold air is used. - Do not use bubble bath or fragrant soap. - Do not use vaginal sprays or talcum powder. These contain chemicals that irritate the skin. - If the genital area is sore or swollen, fragrance-free disposable wipes can be used instead of toilet paper. - Emollients, such as Vaseline may help protect the skin and may be applied to the irritated area. - Always remember to clean yourself from front to back after bowel movements. Pat dry after urinating. - Don't keep the wet swimsuit on for a long time after swimming.

## 2021-06-29 NOTE — Progress Notes (Signed)
Virtual Visit Consent   Dawn Brock, you are scheduled for a virtual visit with a [facility] provider today. Just as with appointments in the office, your consent must be obtained to participate. Your consent will be active for this visit and any virtual visit you may have with one of our providers in the next 365 days. If you have a MyChart account, a copy of this consent can be sent to you electronically.  As this is a virtual visit, video technology does not allow for your provider to perform a traditional examination. This may limit your provider's ability to fully assess your condition. If your provider identifies any concerns that need to be evaluated in person or the need to arrange testing (such as labs, EKG, etc.), we will make arrangements to do so. Although advances in technology are sophisticated, we cannot ensure that it will always work on either your end or our end. If the connection with a video visit is poor, the visit may have to be switched to a telephone visit. With either a video or telephone visit, we are not always able to ensure that we have a secure connection.  By engaging in this virtual visit, you consent to the provision of healthcare and authorize for your insurance to be billed (if applicable) for the services provided during this visit. Depending on your insurance coverage, you may receive a charge related to this service.  I need to obtain your verbal consent now. Are you willing to proceed with your visit today? Timeka AHNYA AKRE has provided verbal consent on 06/29/2021 for a virtual visit (video or telephone). Dawn Brock, [place]  Date: 06/29/2021 11:52 AM  Virtual Visit via Video Note   I, Dawn Brock, connected with  JARYN HOCUTT  ([id number], 1986/01/05) on 06/29/21 at 11:45 AM EDT by a video-enabled telemedicine application and verified that I am speaking with the correct person using two identifiers.  Location: Patient: Virtual Visit  Location Patient: Home Provider: Virtual Visit Location Provider: Home Office   I discussed the limitations of evaluation and management by telemedicine and the availability of in person appointments. The patient expressed understanding and agreed to proceed.    History of Present Illness: Dawn Brock is a 36 y.o. who identifies as a female who was assigned female at birth, and is being seen today for yeast infection.  Patient was seen by Urgent Care on 06/22/21 for left axilla abscess. This was drained and she was given oral augmentin x 7 days. She has completed the antibiotic. She has vaginal itching, irritation and white discharge. She has hx of yeast infections and feels like this is the same.  Denies: fevers, chills, concerns for pregnancy or STD, current breastfeeding status, urinary pain/pelvic pain  She has not tried anything for her symptoms  HPI: HPI  Problems:  Patient Active Problem List   Diagnosis Date Noted   H/O LEEP 08/21/2016   Headache 08/21/2016   Mild tetrahydrocannabinol (THC) abuse 08/21/2016   History of substance abuse ([place]) 08/21/2016   Cocaine abuse complicating pregnancy ([place]) 02/22/2013   Alcohol dependence ([place]) 03/12/2012   Cannabis dependence ([place]) 03/12/2012   Executive function deficit 03/11/2012    Class: Acute   Cigarette smoker 03/10/2012   Duodenitis with nausea, vomiting, diarrhea and abdominal pain 03/10/2012    Allergies:  Allergies  Allergen Reactions   Tramadol Nausea And Vomiting   Medications:  Current Outpatient Medications:    fluconazole (DIFLUCAN) 150 MG tablet, Take one  tablet today, may repeat in 3 days if needed, Disp: 2 tablet, Rfl: 0   amoxicillin-clavulanate (AUGMENTIN) 875-125 MG tablet, Take 1 tablet by mouth every 12 (twelve) hours., Disp: 14 tablet, Rfl: 0   ibuprofen (ADVIL) 600 MG tablet, Take 1 tablet (600 mg total) by mouth every 6 (six) hours as needed., Disp: 30 tablet, Rfl:  0  Observations/Objective: Patient is well-developed, well-nourished in no acute distress.  Resting comfortably  at home.  Head is normocephalic, atraumatic.  No labored breathing.  Speech is clear and coherent with logical content.  Patient is alert and oriented at baseline.   Assessment and Plan: 1. Acute vaginitis No red flag sx Suspect yeast infection Recommend diflucan 150 mg today, may repeat in 3 days if needed If lack of improvement or new sx, needs in person visit for vaginal swab and other appropriate work-up  Follow Up Instructions: I discussed the assessment and treatment plan with the patient. The patient was provided an opportunity to ask questions and all were answered. The patient agreed with the plan and demonstrated an understanding of the instructions.  A copy of instructions were sent to the patient via MyChart unless otherwise noted below.   Patient has requested to receive PHI (AVS, Work Notes, etc) pertaining to this video visit through e-mail as they are currently without active [place]. They have voiced understand that email is not considered secure and their health information could be viewed by someone other than the patient.   The patient was advised to call back or seek an in-person evaluation if the symptoms worsen or if the condition fails to improve as anticipated.  Time:  I spent 5-10 minutes with the patient via telehealth technology discussing the above problems/concerns.    Dawn Brock, [place]

## 2021-07-01 ENCOUNTER — Ambulatory Visit: Payer: Medicaid Other | Admitting: Podiatry

## 2021-07-02 ENCOUNTER — Encounter: Payer: Medicaid Other | Admitting: Nurse Practitioner

## 2021-07-16 ENCOUNTER — Ambulatory Visit: Payer: Medicaid Other | Admitting: Podiatry

## 2021-08-15 DIAGNOSIS — Z114 Encounter for screening for human immunodeficiency virus [HIV]: Secondary | ICD-10-CM | POA: Diagnosis not present

## 2021-08-15 DIAGNOSIS — A5901 Trichomonal vulvovaginitis: Secondary | ICD-10-CM | POA: Diagnosis not present

## 2021-08-15 DIAGNOSIS — N760 Acute vaginitis: Secondary | ICD-10-CM | POA: Diagnosis not present

## 2021-08-15 DIAGNOSIS — Z113 Encounter for screening for infections with a predominantly sexual mode of transmission: Secondary | ICD-10-CM | POA: Diagnosis not present

## 2021-08-15 DIAGNOSIS — N390 Urinary tract infection, site not specified: Secondary | ICD-10-CM | POA: Diagnosis not present

## 2021-08-25 ENCOUNTER — Ambulatory Visit (INDEPENDENT_AMBULATORY_CARE_PROVIDER_SITE_OTHER): Payer: Medicaid Other | Admitting: Podiatry

## 2021-08-25 DIAGNOSIS — Z91199 Patient's noncompliance with other medical treatment and regimen due to unspecified reason: Secondary | ICD-10-CM

## 2021-08-26 NOTE — Progress Notes (Signed)
Patient was no-show for appointment today 

## 2021-09-17 ENCOUNTER — Emergency Department (HOSPITAL_COMMUNITY)
Admission: EM | Admit: 2021-09-17 | Discharge: 2021-09-17 | Disposition: A | Discharge status: Home / Self Care | Payer: Medicaid Other | Attending: Emergency Medicine | Admitting: Emergency Medicine

## 2021-09-17 ENCOUNTER — Encounter (HOSPITAL_COMMUNITY): Payer: Self-pay | Admitting: Emergency Medicine

## 2021-09-17 ENCOUNTER — Other Ambulatory Visit: Payer: Self-pay

## 2021-09-17 DIAGNOSIS — Y9367 Activity, basketball: Secondary | ICD-10-CM | POA: Insufficient documentation

## 2021-09-17 DIAGNOSIS — W500XXA Accidental hit or strike by another person, initial encounter: Secondary | ICD-10-CM | POA: Diagnosis not present

## 2021-09-17 DIAGNOSIS — S01511A Laceration without foreign body of lip, initial encounter: Secondary | ICD-10-CM | POA: Insufficient documentation

## 2021-09-17 DIAGNOSIS — S0993XA Unspecified injury of face, initial encounter: Secondary | ICD-10-CM | POA: Diagnosis present

## 2021-09-17 MED ORDER — LIDOCAINE-EPINEPHRINE-TETRACAINE (LET) TOPICAL GEL
3.0000 mL | Freq: Once | TOPICAL | Status: AC
  Administered 2021-09-17: 3 mL via TOPICAL
  Filled 2021-09-17: qty 3

## 2021-09-17 MED ORDER — LORAZEPAM 1 MG PO TABS
0.5000 mg | ORAL_TABLET | Freq: Once | ORAL | Status: AC
Start: 2021-09-17 — End: 2021-09-17
  Administered 2021-09-17: 0.5 mg via ORAL
  Filled 2021-09-17: qty 1

## 2021-09-17 MED ORDER — LIDOCAINE HCL (PF) 1 % IJ SOLN
10.0000 mL | Freq: Once | INTRAMUSCULAR | Status: AC
  Administered 2021-09-17: 10 mL
  Filled 2021-09-17: qty 10

## 2021-09-17 MED ORDER — IBUPROFEN 600 MG PO TABS: 600.0000 mg | ORAL_TABLET | Freq: Four times a day (QID) | ORAL | 0 refills | Status: DC | PRN

## 2021-09-17 MED ORDER — OXYCODONE-ACETAMINOPHEN 5-325 MG PO TABS: 1.0000 | ORAL_TABLET | Freq: Three times a day (TID) | ORAL | 0 refills | Status: DC | PRN

## 2021-09-17 MED ORDER — OXYCODONE-ACETAMINOPHEN 5-325 MG PO TABS
1.0000 | ORAL_TABLET | Freq: Once | ORAL | Status: AC
  Administered 2021-09-17: 1 via ORAL
  Filled 2021-09-17: qty 1

## 2021-09-17 NOTE — ED Provider Triage Note (Addendum)
Emergency Medicine Provider Triage Evaluation Note  Dawn Brock , a 36 y.o. female  was evaluated in triage.  Pt complains of facial trauma around mid afternoon yesterday states she was elbowed playing basketball.  Bleeding was controlled but it was more swollen today so she came to ER  Review of Systems  Positive: Face trauma Negative: Fever   Physical Exam  BP (!) 165/96 (BP Location: Right Arm)   Pulse (!) 53   Temp 98 F (36.7 C)   Resp 18   Ht '5\' 7"'$  (1.702 m)   Wt 102 kg   SpO2 99%   BMI 35.22 kg/m  Gen:   Awake, no distress   Resp:  Normal effort  MSK:   Moves extremities without difficulty  Other:  Swollen right upper lip w laceration  Medical Decision Making  Medically screening exam initiated at 12:34 PM.  Appropriate orders placed.  [name] was informed that the remainder of the evaluation will be completed by another provider, this initial triage assessment does not replace that evaluation, and the importance of remaining in the ED until their evaluation is complete.  Last tdap 5 years ago   Tedd Sias, [place] 09/17/21 1235    Pati Gallo [place], [place] 09/17/21 1236

## 2021-09-17 NOTE — ED Notes (Signed)
Patient provided with saline gauze at this time to place on wound per provider request.

## 2021-09-17 NOTE — ED Provider Notes (Signed)
[facility] EMERGENCY DEPARTMENT Provider Note   CSN: [id number] Arrival date & time: 09/17/21  1209     History  Chief Complaint  Patient presents with   Laceration    Dawn Brock is a 36 y.o. female who presents to the ED complaining of laceration onset yesterday. She notes that she was elbowed in the face while playing basketball.  Has not tried any medication for symptoms. Tetanus within the past 5 years. Denies trouble breathing/swallowing.  The history is provided by the patient. No language interpreter was used.       Home Medications Prior to Admission medications   Medication Sig Start Date End Date Taking? Authorizing Provider  oxyCODONE-acetaminophen (PERCOCET/ROXICET) 5-325 MG tablet Take 1 tablet by mouth every 8 (eight) hours as needed for severe pain. 09/17/21  Yes Leauna Sharber A, PA-C  amoxicillin-clavulanate (AUGMENTIN) 875-125 MG tablet Take 1 tablet by mouth every 12 (twelve) hours. 06/22/21   Talbot Grumbling, FNP  fluconazole (DIFLUCAN) 150 MG tablet Take one tablet today, may repeat in 3 days if needed 06/29/21   Inda Coke, PA  ibuprofen (ADVIL) 600 MG tablet Take 1 tablet (600 mg total) by mouth every 6 (six) hours as needed. 09/17/21   Welles Walthall A, PA-C      Allergies    Tramadol    Review of Systems   Review of Systems  HENT:  Positive for facial swelling (Lip).   Skin:  Positive for wound. Negative for color change.  All other systems reviewed and are negative.   Physical Exam Updated Vital Signs BP (!) 165/96 (BP Location: Right Arm)   Pulse (!) 53   Temp 98 F (36.7 C)   Resp 18   Ht '5\' 7"'$  (1.702 m)   Wt 102 kg   SpO2 99%   BMI 35.22 kg/m  Physical Exam Vitals and nursing note reviewed.  Constitutional:      General: She is not in acute distress.    Appearance: She is not diaphoretic.  HENT:     Head: Normocephalic.     Mouth/Throat:     Mouth: Lacerations present.     Pharynx: No  oropharyngeal exudate.      Comments: Laceration noted to right upper lip through vermilion border.  Laceration noted to inner mucosa of right upper lip approximately 2 cm in length. Eyes:     General: No scleral icterus.    Conjunctiva/sclera: Conjunctivae normal.  Cardiovascular:     Rate and Rhythm: Normal rate and regular rhythm.     Pulses: Normal pulses.     Heart sounds: Normal heart sounds.  Pulmonary:     Effort: Pulmonary effort is normal. No respiratory distress.     Breath sounds: Normal breath sounds. No wheezing.  Abdominal:     General: Abdomen is flat. There is no distension.     Palpations: Abdomen is soft.  Musculoskeletal:        General: Normal range of motion.     Cervical back: Normal range of motion and neck supple.  Skin:    General: Skin is warm and dry.  Neurological:     Mental Status: She is alert.  Psychiatric:        Behavior: Behavior normal.     ED Results / Procedures / Treatments   Labs (all labs ordered are listed, but only abnormal results are displayed) Labs Reviewed - No data to display  EKG None  Radiology No results found.  Procedures .Marland KitchenLaceration Repair  Date/Time: 09/17/2021 6:37 PM  Performed by: Steva Colder A, PA-C Authorized by: Nehemiah Settle, PA-C   Consent:    Consent obtained:  Verbal   Consent given by:  Patient   Risks discussed:  Infection and pain Universal protocol:    Patient identity confirmed:  Verbally with patient and hospital-assigned identification number Anesthesia:    Anesthesia method:  Topical application and local infiltration   Topical anesthetic:  LET   Local anesthetic:  Lidocaine 1% w/o epi Laceration details:    Location:  Lip   Lip location:  Upper lip, full thickness   Vermilion border involved: yes     Height of lip laceration:  Vermilion only   Length (cm):  2 Pre-procedure details:    Preparation:  Patient was prepped and draped in usual sterile fashion Exploration:     Hemostasis achieved with:  LET and direct pressure   Imaging outcome: foreign body not noted     Wound exploration: entire depth of wound visualized   Treatment:    Area cleansed with:  Saline   Amount of cleaning:  Standard   Irrigation solution:  Sterile saline   Irrigation method:  Syringe Skin repair:    Repair method:  Sutures   Suture size:  6-0   Suture material:  Prolene   Suture technique:  Simple interrupted   Number of sutures:  1 Approximation:    Approximation:  Close   Vermilion border well-aligned: yes   Repair type:    Repair type:  Simple Post-procedure details:    Dressing:  Open (no dressing)   Procedure completion:  Tolerated well, no immediate complications .Marland KitchenLaceration Repair  Date/Time: 09/17/2021 6:39 PM  Performed by: Steva Colder A, PA-C Authorized by: Nehemiah Settle, PA-C   Consent:    Consent obtained:  Verbal   Consent given by:  Patient   Risks discussed:  Infection and pain Universal protocol:    Patient identity confirmed:  Verbally with patient and hospital-assigned identification number Anesthesia:    Anesthesia method:  Topical application and local infiltration   Topical anesthetic:  LET   Local anesthetic:  Lidocaine 1% w/o epi Laceration details:    Location:  Lip   Lip location:  Upper lip, full thickness   Vermilion border involved: yes     Height of lip laceration:  Vermilion only   Length (cm):  2 Pre-procedure details:    Preparation:  Patient was prepped and draped in usual sterile fashion Exploration:    Hemostasis achieved with:  Direct pressure and LET   Imaging outcome: foreign body not noted     Wound exploration: entire depth of wound visualized   Treatment:    Area cleansed with:  Saline   Irrigation solution:  Sterile water   Irrigation method:  Syringe Skin repair:    Repair method:  Sutures   Suture size:  6-0   Wound skin closure material used: Vicryl.   Suture technique:  Simple interrupted   Number of  sutures:  5 Approximation:    Approximation:  Close   Vermilion border well-aligned: yes   Repair type:    Repair type:  Simple Post-procedure details:    Dressing:  Open (no dressing)   Procedure completion:  Tolerated well, no immediate complications     Medications Ordered in ED Medications  oxyCODONE-acetaminophen (PERCOCET/ROXICET) 5-325 MG per tablet 1 tablet (has no administration in time range)  oxyCODONE-acetaminophen (PERCOCET/ROXICET) 5-325 MG per tablet 1  tablet (1 tablet Oral Given 09/17/21 1350)  lidocaine (PF) (XYLOCAINE) 1 % injection 10 mL (10 mLs Infiltration Given by Other 09/17/21 1627)  LORazepam (ATIVAN) tablet 0.5 mg (0.5 mg Oral Given 09/17/21 1650)  lidocaine-EPINEPHrine-tetracaine (LET) topical gel (3 mLs Topical Given by Other 09/17/21 1700)    ED Course/ Medical Decision Making/ A&P                           Medical Decision Making Risk Prescription drug management.   Patient presents with laceration noted to right upper lip onset yesterday status post being hit after playing basketball. Pt is not on anticoagulants at this time. Vital signs, patient afebrile. On exam, patient with Laceration noted to right upper lip through vermilion border.  Laceration noted to inner mucosa of right upper lip approximately 2 cm in length. Tetanus up-to-date. Laceration occurred < 12 hours prior to repair. Differential diagnosis includes, fracture, foreign body, dislocation, avulsion.    Medications:  I ordered medication including Percocet, Ativan, LET for symptom management Reevaluation of the patient after these medicines and interventions, I reevaluated the patient and found that they have improved I have reviewed the patients home medicines and have made adjustments as needed    Disposition: Presenting suspicious for laceration. Doubt fracture, dislocation, or foreign body at this time. Tetanus up to date. Wound thoroughly irrigated, no foreign bodies noted. Laceration  repaired in the ED today. After consideration of the diagnostic results and the patients response to treatment, I feel that the patient would benefit from Discharge home.  PDMP reviewed, short course of Percocet sent today.  Prescription for ibuprofen also sent.  Discussed laceration care with pt and answered questions. Pt to follow up for suture/staple removal in 5-7 days and wound check sooner should there be signs of dehiscence or infection. Pt is hemodynamically stable with no complaints prior to discharge. Supportive care measures and strict return precautions discussed with patient at bedside. Pt acknowledges and verbalizes understanding. Pt appears safe for discharge. Follow up as indicated in discharge paperwork.    This chart was dictated using voice recognition software, Dragon. Despite the best efforts of this provider to proofread and correct errors, errors may still occur which can change documentation meaning.  Final Clinical Impression(s) / ED Diagnoses Final diagnoses:  Lip laceration, initial encounter    Rx / DC Orders ED Discharge Orders          Ordered    oxyCODONE-acetaminophen (PERCOCET/ROXICET) 5-325 MG tablet  Every 8 hours PRN        09/17/21 1832    ibuprofen (ADVIL) 600 MG tablet  Every 6 hours PRN        09/17/21 1833              Tashara Suder A, PA-C 09/17/21 1841    Charlesetta Shanks, MD 09/26/21 0010

## 2021-09-17 NOTE — Discharge Instructions (Addendum)
It was a pleasure taking care of you today!   You may return to urgent care or return to the emergency department for suture removal in 5-7 days.  Keep the area clean and dry. You will be sent a short course of percocet, take as directed. You may alternate this medication with 600 mg ibuprofen as prescribed. You may place ice to the affected area for up to 15 minutes at a time, ensure to place a barrier between your skin and the ice.  Return to the emergency department if worsening or persistent pain, drainage of wound, increased swelling, or color change to area.

## 2021-09-17 NOTE — ED Notes (Signed)
All discharge instructions including follow up care and prescriptions reviewed with patient and patient verbalized understanding of same. Patient stable and ambulatory at time of discharge.  

## 2021-09-17 NOTE — ED Triage Notes (Signed)
Pt was playing basketball yesterday and was elbowed in lip. Laceration to right side of lip. Swelling to right side of face.

## 2021-09-18 ENCOUNTER — Other Ambulatory Visit (HOSPITAL_COMMUNITY)
Admission: RE | Admit: 2021-09-18 | Discharge: 2021-09-18 | Disposition: A | Discharge status: Home / Self Care | Payer: Medicaid Other | Source: Ambulatory Visit | Attending: Nurse Practitioner | Admitting: Nurse Practitioner

## 2021-09-18 ENCOUNTER — Telehealth: Payer: Self-pay

## 2021-09-18 ENCOUNTER — Ambulatory Visit (INDEPENDENT_AMBULATORY_CARE_PROVIDER_SITE_OTHER): Payer: Medicaid Other | Admitting: Nurse Practitioner

## 2021-09-18 ENCOUNTER — Encounter: Payer: Self-pay | Admitting: Nurse Practitioner

## 2021-09-18 VITALS — BP 160/70 | HR 80 | Temp 98.3°F | Ht 67.0 in | Wt 203.0 lb

## 2021-09-18 DIAGNOSIS — Z720 Tobacco use: Secondary | ICD-10-CM | POA: Diagnosis not present

## 2021-09-18 DIAGNOSIS — Z124 Encounter for screening for malignant neoplasm of cervix: Secondary | ICD-10-CM | POA: Diagnosis not present

## 2021-09-18 DIAGNOSIS — Z1159 Encounter for screening for other viral diseases: Secondary | ICD-10-CM

## 2021-09-18 DIAGNOSIS — A599 Trichomoniasis, unspecified: Secondary | ICD-10-CM

## 2021-09-18 DIAGNOSIS — B9689 Other specified bacterial agents as the cause of diseases classified elsewhere: Secondary | ICD-10-CM

## 2021-09-18 DIAGNOSIS — S01511D Laceration without foreign body of lip, subsequent encounter: Secondary | ICD-10-CM | POA: Diagnosis not present

## 2021-09-18 DIAGNOSIS — Z6831 Body mass index (BMI) 31.0-31.9, adult: Secondary | ICD-10-CM

## 2021-09-18 DIAGNOSIS — Z23 Encounter for immunization: Secondary | ICD-10-CM | POA: Diagnosis not present

## 2021-09-18 DIAGNOSIS — Z0000 Encounter for general adult medical examination without abnormal findings: Secondary | ICD-10-CM

## 2021-09-18 DIAGNOSIS — Z0001 Encounter for general adult medical examination with abnormal findings: Secondary | ICD-10-CM | POA: Diagnosis not present

## 2021-09-18 DIAGNOSIS — N760 Acute vaginitis: Secondary | ICD-10-CM | POA: Diagnosis not present

## 2021-09-18 DIAGNOSIS — E6609 Other obesity due to excess calories: Secondary | ICD-10-CM

## 2021-09-18 LAB — POCT URINALYSIS DIPSTICK
Glucose, UA: NEGATIVE
Leukocytes, UA: NEGATIVE
Nitrite, UA: NEGATIVE
Protein, UA: POSITIVE — AB
Spec Grav, UA: 1.02 (ref 1.010–1.025)
Urobilinogen, UA: 1 E.U./dL
pH, UA: 7 (ref 5.0–8.0)

## 2021-09-18 MED ORDER — NICOTINE 21 MG/24HR TD PT24: 21.0000 mg | MEDICATED_PATCH | TRANSDERMAL | 1 refills | Status: AC

## 2021-09-18 NOTE — Telephone Encounter (Signed)
Called patient to schedule appt for 09/24/2021 to get stiches removed patient stated she may  not be able to make it due to transportation issues so she may cancel appt with [place] and do walk in at Urgent care.

## 2021-09-18 NOTE — Patient Instructions (Signed)

## 2021-09-18 NOTE — Progress Notes (Signed)
I,Tianna Badgett,acting as a Education administrator for [employer], FNP.,have documented all relevant documentation on the behalf of Minette Brine, FNP,as directed by  Minette Brine, FNP while in the presence of Minette Brine, [place].  Subjective:     Patient ID: Dawn Brock , female    DOB: 10-11-1985 , 36 y.o.   MRN: [id number]   Chief Complaint  Patient presents with   Annual Exam    HPI  Patient presents today for HM.  Declines any cold symptoms.  She reports while playing basketball she busted her lip on the right side. She took 600 mg ibuprofen and oxycodone this morning and applying ice pack.  [facility] and treated for bacterial vaginitis and trichomoniasis - took metronidazole and bactrim.   Denies any drug use since her last pregnancy in 2018, denies any further use.        Past Medical History:  Diagnosis Date   Abnormal Pap smear    Anemia    Gastritis    Hx of chlamydia infection    Vaginal Pap smear, abnormal      Family History  Problem Relation Age of Onset   Hypertension Mother    Fibromyalgia Mother    Cirrhosis Father    Kidney disease Father    Asthma Brother    Birth defects Son        hirschprungs     Current Outpatient Medications:    nicotine (NICODERM CQ - DOSED IN MG/24 HOURS) 21 mg/24hr patch, Place 1 patch (21 mg total) onto the skin daily., Disp: 30 patch, Rfl: 1   ibuprofen (ADVIL) 600 MG tablet, Take 1 tablet (600 mg total) by mouth every 6 (six) hours as needed., Disp: 30 tablet, Rfl: 0   oxyCODONE-acetaminophen (PERCOCET/ROXICET) 5-325 MG tablet, Take 1 tablet by mouth every 8 (eight) hours as needed for severe pain., Disp: 5 tablet, Rfl: 0   Allergies  Allergen Reactions   Tramadol Nausea And Vomiting      The patient states she uses none for birth control. Patient's last menstrual period was 08/28/2021 (approximate).  Patient's last menstrual period was 08/28/2021 (approximate).  Negative  for Dysmenorrhea and Positive for Menorrhagia. IUD removed in 2021, has been having heavy bleeding since that time. Negative for: breast discharge, breast lump(s), breast pain and breast self exam. Associated symptoms include abnormal vaginal bleeding. Pertinent negatives include abnormal bleeding (hematology), anxiety, decreased libido, depression, difficulty falling sleep, dyspareunia, history of infertility, nocturia, sexual dysfunction, sleep disturbances, urinary incontinence, urinary urgency, vaginal discharge and vaginal itching. Diet regular. The patient states her exercise level is none. Reports she is moving around a lot but not on a regimen   The patient's tobacco use is:  Social History   Tobacco Use  Smoking Status Every Day   Packs/day: 0.50   Years: 20.00   Total pack years: 10.00   Types: Cigarettes  Smokeless Tobacco Never   She has been exposed to passive smoke. The patient's alcohol use is:  Social History   Substance and Sexual Activity  Alcohol Use Yes   Comment: Not since June 2014   Additional information: Last pap more than 3 years, next one scheduled for today.    Review of Systems  Constitutional: Negative.   HENT: Negative.    Eyes: Negative.   Respiratory: Negative.    Cardiovascular: Negative.   Gastrointestinal: Negative.   Endocrine: Negative.   Genitourinary: Negative.   Musculoskeletal: Negative.  Skin: Negative.   Allergic/Immunologic: Negative.   Neurological: Negative.  Negative for dizziness.  Hematological: Negative.   Psychiatric/Behavioral: Negative.       Today's Vitals   09/18/21 1535  BP: (!) 160/70  Pulse: 80  Temp: 98.3 F (36.8 C)  TempSrc: Oral  Weight: 203 lb (92.1 kg)  Height: $Remove'5\' 7"'HwrwTlC$  (1.702 m)   Body mass index is 31.79 kg/m.  Wt Readings from Last 3 Encounters:  09/24/21 203 lb (92.1 kg)  09/18/21 203 lb (92.1 kg)  09/17/21 224 lb 13.9 oz (102 kg)    Objective:  Physical Exam Vitals reviewed. Exam conducted  with a chaperone present.  Constitutional:      General: She is not in acute distress.    Appearance: Normal appearance. She is well-developed. She is obese.  HENT:     Head: Normocephalic and atraumatic.     Right Ear: Hearing, tympanic membrane, ear canal and external ear normal. There is no impacted cerumen.     Left Ear: Hearing, tympanic membrane, ear canal and external ear normal. There is no impacted cerumen.     Nose:     Comments: Deferred - masked    Mouth/Throat:     Comments: Deferred - masked Eyes:     General: Lids are normal.     Extraocular Movements: Extraocular movements intact.     Conjunctiva/sclera: Conjunctivae normal.     Pupils: Pupils are equal, round, and reactive to light.     Funduscopic exam:    Right eye: No papilledema.        Left eye: No papilledema.  Neck:     Thyroid: No thyroid mass.     Vascular: No carotid bruit.  Cardiovascular:     Rate and Rhythm: Normal rate and regular rhythm.     Pulses: Normal pulses.     Heart sounds: Normal heart sounds. No murmur heard. Pulmonary:     Effort: Pulmonary effort is normal. No respiratory distress.     Breath sounds: Normal breath sounds. No wheezing.  Chest:     Chest wall: No mass.  Breasts:    Tanner Score is 5.     Right: Normal. No mass or tenderness.     Left: Normal. No mass or tenderness.  Abdominal:     General: Abdomen is flat. Bowel sounds are normal. There is no distension.     Palpations: Abdomen is soft.     Tenderness: There is no abdominal tenderness.     Hernia: There is no hernia in the right inguinal area.  Genitourinary:    General: Normal vulva.     Exam position: Lithotomy position.     Tanner stage (genital): 5.     Labia:        Right: No rash or tenderness.        Left: No rash or tenderness.      Rectum: Normal. Guaiac result negative.  Musculoskeletal:        General: No swelling or tenderness. Normal range of motion.     Cervical back: Full passive range of  motion without pain, normal range of motion and neck supple.     Right lower leg: No edema.     Left lower leg: No edema.  Lymphadenopathy:     Upper Body:     Right upper body: No supraclavicular, axillary or pectoral adenopathy.     Left upper body: No supraclavicular, axillary or pectoral adenopathy.  Skin:    General: Skin  is warm and dry.     Capillary Refill: Capillary refill takes less than 2 seconds.     Comments: Has suture to right side of lip with some swelling  Neurological:     General: No focal deficit present.     Mental Status: She is alert and oriented to person, place, and time.     Cranial Nerves: No cranial nerve deficit.     Sensory: No sensory deficit.  Psychiatric:        Mood and Affect: Mood normal.        Behavior: Behavior normal.        Thought Content: Thought content normal.        Judgment: Judgment normal.         Assessment And Plan:     1. Encounter for annual physical exam Behavior modifications discussed and diet history reviewed.   Pt will continue to exercise regularly and modify diet with low GI, plant based foods and decrease intake of processed foods.  Recommend intake of daily multivitamin, Vitamin D, and calcium.  Recommend self breast exams for preventive screenings, as well as recommend immunizations that include influenza, TDAP  - CBC - Hemoglobin A1c - CMP14+EGFR - Lipid panel  2. Encounter for hepatitis C screening test for low risk patient Will check Hepatitis C screening due to recent recommendations to screen all adults 18 years and older - Hepatitis C antibody  3. Class 1 obesity due to excess calories without serious comorbidity with body mass index (BMI) of 31.0 to 31.9 in adult She is encouraged to strive for BMI less than 30 to decrease cardiac risk. Advised to aim for at least 150 minutes of exercise per week.  4. Lip laceration, subsequent encounter Comments: she is to return on 9/13 for suture removal   5.  Trichomoniasis Comments: She [facility], will recheck to see if she is clear. - Cytology -Pap Smear  6. Bacterial vaginosis Comments: She was treated recently, will recheck.  - Urine cytology ancillary only - POCT Urinalysis Dipstick (81002)  7. Tobacco abuse Comments: She is ready to quit smoking, sent Rx for nicotine patches - nicotine (NICODERM CQ - DOSED IN MG/24 HOURS) 21 mg/24hr patch; Place 1 patch (21 mg total) onto the skin daily.  Dispense: 30 patch; Refill: 1  8. Need for influenza vaccination Influenza vaccine administered Encouraged to take Tylenol as needed for fever or muscle aches. - Flu Vaccine QUAD 6+ mos PF IM (Fluarix Quad PF)  9. Encounter for Papanicolaou smear of cervix   Patient was given opportunity to ask questions. Patient verbalized understanding of the plan and was able to repeat key elements of the plan. All questions were answered to their satisfaction.   Minette Brine, FNP   I, Minette Brine, FNP, have reviewed all documentation for this visit. The documentation on 09/18/21 for the exam, diagnosis, procedures, and orders are all accurate and complete.   THE PATIENT IS ENCOURAGED TO PRACTICE SOCIAL DISTANCING DUE TO THE COVID-19 PANDEMIC.

## 2021-09-19 LAB — CMP14+EGFR
ALT: 9 IU/L (ref 0–32)
AST: 22 IU/L (ref 0–40)
Albumin/Globulin Ratio: 1.7 (ref 1.2–2.2)
Albumin: 4.8 g/dL (ref 3.9–4.9)
Alkaline Phosphatase: 91 IU/L (ref 44–121)
BUN/Creatinine Ratio: 10 (ref 9–23)
BUN: 7 mg/dL (ref 6–20)
Bilirubin Total: 0.8 mg/dL (ref 0.0–1.2)
CO2: 20 mmol/L (ref 20–29)
Calcium: 9.4 mg/dL (ref 8.7–10.2)
Chloride: 104 mmol/L (ref 96–106)
Creatinine, Ser: 0.68 mg/dL (ref 0.57–1.00)
Globulin, Total: 2.8 g/dL (ref 1.5–4.5)
Glucose: 94 mg/dL (ref 70–99)
Potassium: 4 mmol/L (ref 3.5–5.2)
Sodium: 140 mmol/L (ref 134–144)
Total Protein: 7.6 g/dL (ref 6.0–8.5)
eGFR: 115 mL/min/{1.73_m2} (ref >59)

## 2021-09-19 LAB — CBC
Hematocrit: 39.9 % (ref 34.0–46.6)
Hemoglobin: 13.5 g/dL (ref 11.1–15.9)
MCH: 31.5 pg (ref 26.6–33.0)
MCHC: 33.8 g/dL (ref 31.5–35.7)
MCV: 93 fL (ref 79–97)
Platelets: 215 10*3/uL (ref 150–450)
RBC: 4.28 x10E6/uL (ref 3.77–5.28)
RDW: 12.3 % (ref 11.7–15.4)
WBC: 5.5 10*3/uL (ref 3.4–10.8)

## 2021-09-19 LAB — LIPID PANEL
Chol/HDL Ratio: 2.7 ratio (ref 0.0–4.4)
Cholesterol, Total: 163 mg/dL (ref 100–199)
HDL: 61 mg/dL (ref >39)
LDL Chol Calc (NIH): 91 mg/dL (ref 0–99)
Triglycerides: 55 mg/dL (ref 0–149)
VLDL Cholesterol Cal: 11 mg/dL (ref 5–40)

## 2021-09-19 LAB — HEMOGLOBIN A1C
Est. average glucose Bld gHb Est-mCnc: 120 mg/dL
Hgb A1c MFr Bld: 5.8 % — ABNORMAL HIGH (ref 4.8–5.6)

## 2021-09-19 LAB — HEPATITIS C ANTIBODY: Hep C Virus Ab: NONREACTIVE

## 2021-09-21 ENCOUNTER — Telehealth: Payer: Medicaid Other | Admitting: Nurse Practitioner

## 2021-09-21 DIAGNOSIS — Z5189 Encounter for other specified aftercare: Secondary | ICD-10-CM | POA: Diagnosis not present

## 2021-09-21 NOTE — Progress Notes (Signed)
Virtual Visit Consent   Dawn Brock, you are scheduled for a virtual visit with a [facility] provider today. Just as with appointments in the office, your consent must be obtained to participate. Your consent will be active for this visit and any virtual visit you may have with one of our providers in the next 365 days. If you have a MyChart account, a copy of this consent can be sent to you electronically.  As this is a virtual visit, video technology does not allow for your provider to perform a traditional examination. This may limit your provider's ability to fully assess your condition. If your provider identifies any concerns that need to be evaluated in person or the need to arrange testing (such as labs, EKG, etc.), we will make arrangements to do so. Although advances in technology are sophisticated, we cannot ensure that it will always work on either your end or our end. If the connection with a video visit is poor, the visit may have to be switched to a telephone visit. With either a video or telephone visit, we are not always able to ensure that we have a secure connection.  By engaging in this virtual visit, you consent to the provision of healthcare and authorize for your insurance to be billed (if applicable) for the services provided during this visit. Depending on your insurance coverage, you may receive a charge related to this service.  I need to obtain your verbal consent now. Are you willing to proceed with your visit today? Dawn Brock has provided verbal consent on 09/21/2021 for a virtual visit (video or telephone). Tish Men, NP  Date: 09/21/2021 4:00 PM  Virtual Visit via Video Note   I, [place], connected with  Dawn Brock  ([id number], [birth year]) on 09/21/21 at  3:30 PM EDT by a video-enabled telemedicine application and verified that I am speaking with the correct person using two identifiers.  Location: Patient:  Virtual Visit Location Patient: Home Provider: Virtual Visit Location Provider: Home   I discussed the limitations of evaluation and management by telemedicine and the availability of in person appointments. The patient expressed understanding and agreed to proceed.    History of Present Illness: Dawn Brock is a 36 y.o. who identifies as a female who was assigned female at birth, and is being seen today for wound check for sutures placed in the 4 sutures in the right upper lip on 9/7 or 9/8. Patient is concerned because she has a scab over the sutures. She is concerned because she was told that if a scab forms, it will cause risk of the wound pulling apart when the stitches are removed. The patient denies fever, chills, nausea, vomiting, foul-smelling drainage from the site. States she has been cleaning the area with warm water.   HPI: HPI  Problems:  Patient Active Problem List   Diagnosis Date Noted   H/O LEEP 08/21/2016   Headache 08/21/2016   Mild tetrahydrocannabinol (THC) abuse 08/21/2016   History of substance abuse ([place]) 08/21/2016   Cocaine abuse complicating pregnancy ([place]) 02/22/2013   Alcohol dependence ([place]) 03/12/2012   Cannabis dependence ([place]) 03/12/2012   Executive function deficit 03/11/2012    Class: Acute   Cigarette smoker 03/10/2012   Duodenitis with nausea, vomiting, diarrhea and abdominal pain 03/10/2012    Allergies:  Allergies  Allergen Reactions   Tramadol Nausea And Vomiting   Medications:  Current Outpatient Medications:    ibuprofen (ADVIL) 600 MG  tablet, Take 1 tablet (600 mg total) by mouth every 6 (six) hours as needed., Disp: 30 tablet, Rfl: 0   nicotine (NICODERM CQ - DOSED IN MG/24 HOURS) 21 mg/24hr patch, Place 1 patch (21 mg total) onto the skin daily., Disp: 30 patch, Rfl: 1   oxyCODONE-acetaminophen (PERCOCET/ROXICET) 5-325 MG tablet, Take 1 tablet by mouth every 8 (eight) hours as needed for severe pain., Disp: 5 tablet, Rfl:  0  Observations/Objective: Patient is well-developed, well-nourished in no acute distress.  Resting comfortably  at home.  Head is normocephalic, atraumatic.  No labored breathing.  Skin: wound noted to corner aspect of right upper lip. No obvious drainage, redness present (limitations noted using video) Speech is clear and coherent with logical content.  Patient is alert and oriented at baseline.    Assessment and Plan: 1. Wound check, abscess  Patient presents for wound check after sutures placed 2-3 days ago. She has a scab that has formed over the wound and is concerned. Difficult to see wound via vv platform. Does not appear to be infected at this time. Patient advised to continue wound care with warm water, avoid disrupting the scab. Patient advised to apply neosporin to the site. Patient advised to advised that if symptoms worsen, recommend a face-to-face visit. Patient verbalizes understanding, all questions were answered.   Follow Up Instructions: I discussed the assessment and treatment plan with the patient. The patient was provided an opportunity to ask questions and all were answered. The patient agreed with the plan and demonstrated an understanding of the instructions.  A copy of instructions were sent to the patient via MyChart unless otherwise noted below.   The patient was advised to call back or seek an in-person evaluation if the symptoms worsen or if the condition fails to improve as anticipated.  Time:  I spent 10 minutes with the patient via telehealth technology discussing the above problems/concerns.    Tish Men, NP

## 2021-09-21 NOTE — Patient Instructions (Signed)
  Dawn Brock, thank you for joining Tish Men, NP for today's virtual visit.  While this provider is not your primary care provider (PCP), if your PCP is located in our provider database this encounter information will be shared with them immediately following your visit.  Consent: (Patient) Dawn Brock provided verbal consent for this virtual visit at the beginning of the encounter.  Current Medications:  Current Outpatient Medications:    ibuprofen (ADVIL) 600 MG tablet, Take 1 tablet (600 mg total) by mouth every 6 (six) hours as needed., Disp: 30 tablet, Rfl: 0   nicotine (NICODERM CQ - DOSED IN MG/24 HOURS) 21 mg/24hr patch, Place 1 patch (21 mg total) onto the skin daily., Disp: 30 patch, Rfl: 1   oxyCODONE-acetaminophen (PERCOCET/ROXICET) 5-325 MG tablet, Take 1 tablet by mouth every 8 (eight) hours as needed for severe pain., Disp: 5 tablet, Rfl: 0   Medications ordered in this encounter:  No orders of the defined types were placed in this encounter.    *If you need refills on other medications prior to your next appointment, please contact your pharmacy*  Follow-Up: Call back or seek an in-person evaluation if the symptoms worsen or if the condition fails to improve as anticipated.  Other Instructions Continue keeping the area clean and dry. Avoid picking or disrupting the formed scab. May apply topical neosporin as needed. Follow up on 09/24/21 for suture removal. If you develop fever, chills, foul-smelling drainage, fever, chills or other concerns go to the ER immediately.    If you have been instructed to have an in-person evaluation today at a local Urgent Care facility, please use the link below. It will take you to a list of all of our available [facility], including address, phone number and hours of operation. Please do not delay care.  [facility]  If you or a family member do not have a primary care provider,  use the link below to schedule a visit and establish care. When you choose a [facility] primary care physician or advanced practice provider, you gain a long-term partner in health. Find a Primary Care Provider  Learn more about [facility]'s in-office and virtual care options: [facility]

## 2021-09-23 LAB — URINE CYTOLOGY ANCILLARY ONLY
Bacterial Vaginitis-Urine: NEGATIVE
Candida Urine: NEGATIVE

## 2021-09-24 ENCOUNTER — Encounter (HOSPITAL_COMMUNITY): Payer: Self-pay

## 2021-09-24 ENCOUNTER — Ambulatory Visit: Payer: Self-pay | Admitting: Nurse Practitioner

## 2021-09-24 ENCOUNTER — Ambulatory Visit (HOSPITAL_COMMUNITY)
Admission: EM | Admit: 2021-09-24 | Discharge: 2021-09-24 | Disposition: A | Discharge status: Home / Self Care | Payer: Medicaid Other

## 2021-09-24 DIAGNOSIS — S01511D Laceration without foreign body of lip, subsequent encounter: Secondary | ICD-10-CM

## 2021-09-24 DIAGNOSIS — Z4802 Encounter for removal of sutures: Secondary | ICD-10-CM

## 2021-09-24 LAB — CYTOLOGY - PAP
Adequacy: ABSENT
Comment: NEGATIVE
Comment: NEGATIVE
Diagnosis: NEGATIVE
HSV1: NEGATIVE
HSV2: NEGATIVE
Trichomonas: NEGATIVE

## 2021-09-24 NOTE — ED Triage Notes (Signed)
Pt is here for a suture removal on top lip corner .

## 2021-10-30 NOTE — Telephone Encounter (Signed)
er

## 2021-11-10 ENCOUNTER — Encounter (HOSPITAL_COMMUNITY): Payer: Self-pay | Admitting: *Deleted

## 2021-11-10 ENCOUNTER — Ambulatory Visit (HOSPITAL_COMMUNITY)
Admission: EM | Admit: 2021-11-10 | Discharge: 2021-11-10 | Disposition: A | Discharge status: Home / Self Care | Payer: Medicaid Other | Attending: Emergency Medicine | Admitting: Emergency Medicine

## 2021-11-10 ENCOUNTER — Telehealth (HOSPITAL_COMMUNITY): Payer: Self-pay

## 2021-11-10 ENCOUNTER — Ambulatory Visit (INDEPENDENT_AMBULATORY_CARE_PROVIDER_SITE_OTHER): Payer: Medicaid Other

## 2021-11-10 ENCOUNTER — Other Ambulatory Visit: Payer: Self-pay

## 2021-11-10 DIAGNOSIS — S8991XA Unspecified injury of right lower leg, initial encounter: Secondary | ICD-10-CM

## 2021-11-10 DIAGNOSIS — M25561 Pain in right knee: Secondary | ICD-10-CM

## 2021-11-10 MED ORDER — IBUPROFEN 800 MG PO TABS
800.0000 mg | ORAL_TABLET | Freq: Once | ORAL | Status: AC
  Administered 2021-11-10: 800 mg via ORAL

## 2021-11-10 MED ORDER — IBUPROFEN 800 MG PO TABS
ORAL_TABLET | ORAL | Status: AC
  Filled 2021-11-10: qty 1
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
## 2021-11-10 NOTE — Discharge Instructions (Addendum)
Your xray was negative. As we discussed, you may have a soft tissue injury that's not evident on plain x-ray imaging.  I recommend RICE therapy and anti-inflammatories for the next few days. If symptoms persist, I would follow up with the orthopedic specialists  Rest - try to avoid heavy lifting and high impact activity Ice - apply for 20 minutes a few times daily Compression - use knee sleeve as needed when standing/walking Elevation - prop up on a pillow  Ibuprofen up to 800 mg every 6 hours

## 2021-11-12 DIAGNOSIS — S8291XA Unspecified fracture of right lower leg, initial encounter for closed fracture: Secondary | ICD-10-CM | POA: Diagnosis not present

## 2021-11-12 DIAGNOSIS — M25561 Pain in right knee: Secondary | ICD-10-CM | POA: Diagnosis not present

## 2021-11-21 DIAGNOSIS — M25561 Pain in right knee: Secondary | ICD-10-CM | POA: Diagnosis not present

## 2021-12-01 DIAGNOSIS — M25561 Pain in right knee: Secondary | ICD-10-CM | POA: Diagnosis not present

## 2021-12-10 DIAGNOSIS — M25561 Pain in right knee: Secondary | ICD-10-CM | POA: Diagnosis not present

## 2021-12-12 DIAGNOSIS — M25561 Pain in right knee: Secondary | ICD-10-CM | POA: Diagnosis not present

## 2022-03-09 DIAGNOSIS — S83411A Sprain of medial collateral ligament of right knee, initial encounter: Secondary | ICD-10-CM | POA: Diagnosis not present

## 2022-08-09 ENCOUNTER — Emergency Department (HOSPITAL_COMMUNITY)
Admission: EM | Admit: 2022-08-09 | Discharge: 2022-08-10 | Disposition: A | Discharge status: Home / Self Care | Payer: Medicaid Other | Attending: Emergency Medicine | Admitting: Emergency Medicine

## 2022-08-09 ENCOUNTER — Encounter (HOSPITAL_COMMUNITY): Payer: Self-pay

## 2022-08-09 ENCOUNTER — Other Ambulatory Visit: Payer: Self-pay

## 2022-08-09 DIAGNOSIS — J029 Acute pharyngitis, unspecified: Secondary | ICD-10-CM | POA: Diagnosis present

## 2022-08-09 DIAGNOSIS — J069 Acute upper respiratory infection, unspecified: Secondary | ICD-10-CM | POA: Insufficient documentation

## 2022-08-09 NOTE — ED Triage Notes (Signed)
Pt reports sore throat, onset a couple of weeks ago. Painful with swallowing.

## 2022-08-10 MED ORDER — AMOXICILLIN-POT CLAVULANATE 875-125 MG PO TABS
1.0000 | ORAL_TABLET | Freq: Once | ORAL | Status: AC
  Administered 2022-08-10: 1 via ORAL
  Filled 2022-08-10: qty 1

## 2022-08-10 MED ORDER — AMOXICILLIN-POT CLAVULANATE 875-125 MG PO TABS: 1.0000 | ORAL_TABLET | Freq: Two times a day (BID) | ORAL | 0 refills | Status: DC

## 2022-08-10 MED ORDER — DEXAMETHASONE SODIUM PHOSPHATE 10 MG/ML IJ SOLN
10.0000 mg | Freq: Once | INTRAMUSCULAR | Status: AC
  Administered 2022-08-10: 10 mg via INTRAMUSCULAR
  Filled 2022-08-10: qty 1

## 2022-08-10 MED ORDER — KETOROLAC TROMETHAMINE 30 MG/ML IJ SOLN
30.0000 mg | Freq: Once | INTRAMUSCULAR | Status: AC
  Administered 2022-08-10: 30 mg via INTRAMUSCULAR
  Filled 2022-08-10: qty 1

## 2022-08-10 NOTE — ED Provider Notes (Signed)
[facility] AT [facility] Provider Note   CSN: [id number] Arrival date & time: 08/09/22  2257     History  Chief Complaint  Patient presents with   Sore Throat    Dawn Brock is a 37 y.o. female.  Patient presents to the emergency department for evaluation of sore throat.  Patient reports that she has had sinus congestion, sore throat, cough for 2 weeks or more.  She has been bringing up yellow sputum.  She reports that she has been doing some minor home remedies but the sore throat is not going away.  She reports that is very painful to swallow.       Home Medications Prior to Admission medications   Medication Sig Start Date End Date Taking? Authorizing Provider  amoxicillin-clavulanate (AUGMENTIN) 875-125 MG tablet Take 1 tablet by mouth every 12 (twelve) hours. 08/10/22  Yes Shiana Rappleye, Canary Brim, MD  ibuprofen (ADVIL) 600 MG tablet Take 1 tablet (600 mg total) by mouth every 6 (six) hours as needed. 09/17/21   Blue, Soijett A, PA-C  nicotine (NICODERM CQ - DOSED IN MG/24 HOURS) 21 mg/24hr patch Place 1 patch (21 mg total) onto the skin daily. 09/18/21 09/18/22  Arnette Felts, FNP  oxyCODONE-acetaminophen (PERCOCET/ROXICET) 5-325 MG tablet Take 1 tablet by mouth every 8 (eight) hours as needed for severe pain. 09/17/21   Blue, Soijett A, PA-C      Allergies    Tramadol    Review of Systems   Review of Systems  Physical Exam Updated Vital Signs BP (!) 143/102 (BP Location: Right Arm)   Pulse 77   Temp 99.2 F (37.3 C) (Oral)   Resp 18   Ht 5\' 7"  (1.702 m)   Wt 90.7 kg   LMP 07/25/2022 (Approximate)   SpO2 100%   Breastfeeding No   BMI 31.32 kg/m  Physical Exam Vitals and nursing note reviewed.  Constitutional:      General: She is not in acute distress.    Appearance: She is well-developed.  HENT:     Head: Normocephalic and atraumatic.     Mouth/Throat:     Mouth: Mucous membranes are moist.     Pharynx: No oropharyngeal  exudate or posterior oropharyngeal erythema.     Tonsils: No tonsillar exudate or tonsillar abscesses. 2+ on the right. 2+ on the left.  Eyes:     General: Vision grossly intact. Gaze aligned appropriately.     Extraocular Movements: Extraocular movements intact.     Conjunctiva/sclera: Conjunctivae normal.  Cardiovascular:     Rate and Rhythm: Normal rate and regular rhythm.     Pulses: Normal pulses.     Heart sounds: Normal heart sounds, S1 normal and S2 normal. No murmur heard.    No friction rub. No gallop.  Pulmonary:     Effort: Pulmonary effort is normal. No respiratory distress.     Breath sounds: Normal breath sounds.  Abdominal:     General: Bowel sounds are normal.     Palpations: Abdomen is soft.     Tenderness: There is no abdominal tenderness. There is no guarding or rebound.     Hernia: No hernia is present.  Musculoskeletal:        General: No swelling.     Cervical back: Full passive range of motion without pain, normal range of motion and neck supple. No spinous process tenderness or muscular tenderness. Normal range of motion.     Right lower leg: No edema.  Left lower leg: No edema.  Skin:    General: Skin is warm and dry.     Capillary Refill: Capillary refill takes less than 2 seconds.     Findings: No ecchymosis, erythema, rash or wound.  Neurological:     General: No focal deficit present.     Mental Status: She is alert and oriented to person, place, and time.     GCS: GCS eye subscore is 4. GCS verbal subscore is 5. GCS motor subscore is 6.     Cranial Nerves: Cranial nerves 2-12 are intact.     Sensory: Sensation is intact.     Motor: Motor function is intact.     Coordination: Coordination is intact.  Psychiatric:        Attention and Perception: Attention normal.        Mood and Affect: Mood normal.        Speech: Speech normal.        Behavior: Behavior normal.     ED Results / Procedures / Treatments   Labs (all labs ordered are listed,  but only abnormal results are displayed) Labs Reviewed  GROUP A STREP BY PCR    EKG None  Radiology No results found.  Procedures Procedures    Medications Ordered in ED Medications  dexamethasone (DECADRON) injection 10 mg (has no administration in time range)  ketorolac (TORADOL) 30 MG/ML injection 30 mg (has no administration in time range)  amoxicillin-clavulanate (AUGMENTIN) 875-125 MG per tablet 1 tablet (has no administration in time range)    ED Course/ Medical Decision Making/ A&P                             Medical Decision Making  Differential Diagnosis considered includes, but not limited to: COVID-19; influenza; RSV; simple viral URI; pneumonia  Patient with 2-week history of URI symptoms, progressively worsening sore throat.  Strep negative.  Consider possibility of postnasal drip secondary to sinusitis.  Treat empirically.  Patient appears well, no signs of airway compromise.  No physical signs of peritonsillar abscess.        Final Clinical Impression(s) / ED Diagnoses Final diagnoses:  Upper respiratory tract infection, unspecified type    Rx / DC Orders ED Discharge Orders          Ordered    amoxicillin-clavulanate (AUGMENTIN) 875-125 MG tablet  Every 12 hours        08/10/22 0315              Gilda Crease, MD 08/10/22 [phone]

## 2022-08-11 ENCOUNTER — Telehealth: Payer: Self-pay

## 2022-08-11 NOTE — Transitions of Care (Post Inpatient/ED Visit) (Signed)
   08/11/2022  Name: Dawn Brock MRN: [id number] DOB: [birth year]  Today's TOC FU Call Status: Today's TOC FU Call Status:: Unsuccessul Call (1st Attempt) Unsuccessful Call (1st Attempt) Date: 08/11/22  Attempted to reach the patient regarding the most recent Inpatient/ED visit.  Follow Up Plan: Additional outreach attempts will be made to reach the patient to complete the Transitions of Care (Post Inpatient/ED visit) call.   Signature YL,RMA

## 2022-08-12 ENCOUNTER — Telehealth: Payer: Self-pay

## 2022-08-12 NOTE — Transitions of Care (Post Inpatient/ED Visit) (Signed)
   08/12/2022  Name: Dawn Brock MRN: [id number] DOB: 1985-08-11  Today's TOC FU Call Status: Today's TOC FU Call Status:: Unsuccessful Call (2nd Attempt) Unsuccessful Call (2nd Attempt) Date: 08/12/22  Attempted to reach the patient regarding the most recent Inpatient/ED visit.  Follow Up Plan: Additional outreach attempts will be made to reach the patient to complete the Transitions of Care (Post Inpatient/ED visit) call.   Signature YL,RMA

## 2022-09-28 NOTE — Progress Notes (Unsigned)
Madelaine Bhat, CMA,acting as a Neurosurgeon for Arnette Felts, FNP.,have documented all relevant documentation on the behalf of Arnette Felts, FNP,as directed by  Arnette Felts, FNP while in the presence of Arnette Felts, FNP.  Subjective:    Patient ID: Dawn Brock , female    DOB: [birth year] , 37 y.o.   MRN: [id number]  No chief complaint on file.   HPI  Patient presents today for HM, patient reports compliance with medications. Patient denies any chest pain, SOB, or headaches. Patient has no concerns today.     Past Medical History:  Diagnosis Date  . Abnormal Pap smear   . Anemia   . Gastritis   . Hx of chlamydia infection   . Vaginal Pap smear, abnormal      Family History  Problem Relation Age of Onset  . Hypertension Mother   . Fibromyalgia Mother   . Cirrhosis Father   . Kidney disease Father   . Asthma Brother   . Birth defects Son        hirschprungs     Current Outpatient Medications:  .  amoxicillin-clavulanate (AUGMENTIN) 875-125 MG tablet, Take 1 tablet by mouth every 12 (twelve) hours., Disp: 14 tablet, Rfl: 0 .  ibuprofen (ADVIL) 600 MG tablet, Take 1 tablet (600 mg total) by mouth every 6 (six) hours as needed., Disp: 30 tablet, Rfl: 0 .  oxyCODONE-acetaminophen (PERCOCET/ROXICET) 5-325 MG tablet, Take 1 tablet by mouth every 8 (eight) hours as needed for severe pain., Disp: 5 tablet, Rfl: 0   Allergies  Allergen Reactions  . Tramadol Nausea And Vomiting      The patient states she uses {contraceptive methods:5051} for birth control. No LMP recorded.. {Dysmenorrhea-menorrhagia:21918}. Negative for: breast discharge, breast lump(s), breast pain and breast self exam. Associated symptoms include abnormal vaginal bleeding. Pertinent negatives include abnormal bleeding (hematology), anxiety, decreased libido, depression, difficulty falling sleep, dyspareunia, history of infertility, nocturia, sexual dysfunction, sleep disturbances, urinary incontinence,  urinary urgency, vaginal discharge and vaginal itching. Diet regular.The patient states her exercise level is    . The patient's tobacco use is:  Social History   Tobacco Use  Smoking Status Every Day  . Current packs/day: 0.50  . Average packs/day: 0.5 packs/day for 20.0 years (10.0 ttl pk-yrs)  . Types: Cigarettes  Smokeless Tobacco Never  . She has been exposed to passive smoke. The patient's alcohol use is:  Social History   Substance and Sexual Activity  Alcohol Use Yes   Comment: Not since June 2014  . Additional information: Last pap ***, next one scheduled for ***.    Review of Systems  Constitutional: Negative.   HENT: Negative.    Eyes: Negative.   Respiratory: Negative.    Cardiovascular: Negative.   Gastrointestinal: Negative.   Endocrine: Negative.   Genitourinary: Negative.   Musculoskeletal: Negative.   Skin: Negative.   Allergic/Immunologic: Negative.   Neurological: Negative.   Hematological: Negative.   Psychiatric/Behavioral: Negative.      There were no vitals filed for this visit. There is no height or weight on file to calculate BMI.  Wt Readings from Last 3 Encounters:  08/09/22 200 lb (90.7 kg)  09/24/21 203 lb (92.1 kg)  09/18/21 203 lb (92.1 kg)     Objective:  Physical Exam      Assessment And Plan:     Encounter for annual health examination     No follow-ups on file. Patient was given opportunity to ask questions. Patient verbalized understanding of  the plan and was able to repeat key elements of the plan. All questions were answered to their satisfaction.   Arnette Felts, FNP  I, Arnette Felts, FNP, have reviewed all documentation for this visit. The documentation on 09/28/22 for the exam, diagnosis, procedures, and orders are all accurate and complete.

## 2022-09-29 ENCOUNTER — Encounter: Payer: Self-pay | Admitting: Nurse Practitioner

## 2022-09-29 ENCOUNTER — Ambulatory Visit (INDEPENDENT_AMBULATORY_CARE_PROVIDER_SITE_OTHER): Payer: Medicaid Other | Admitting: Nurse Practitioner

## 2022-09-29 VITALS — BP 120/80 | HR 70 | Temp 98.5°F | Ht 67.0 in | Wt 204.2 lb

## 2022-09-29 DIAGNOSIS — Z720 Tobacco use: Secondary | ICD-10-CM

## 2022-09-29 DIAGNOSIS — Z0000 Encounter for general adult medical examination without abnormal findings: Secondary | ICD-10-CM | POA: Diagnosis not present

## 2022-09-29 DIAGNOSIS — Z23 Encounter for immunization: Secondary | ICD-10-CM

## 2022-09-29 DIAGNOSIS — Z3141 Encounter for fertility testing: Secondary | ICD-10-CM

## 2022-09-29 DIAGNOSIS — Z13220 Encounter for screening for lipoid disorders: Secondary | ICD-10-CM | POA: Diagnosis not present

## 2022-09-29 DIAGNOSIS — Z13228 Encounter for screening for other metabolic disorders: Secondary | ICD-10-CM

## 2022-09-29 DIAGNOSIS — F1021 Alcohol dependence, in remission: Secondary | ICD-10-CM | POA: Diagnosis not present

## 2022-09-29 DIAGNOSIS — E6609 Other obesity due to excess calories: Secondary | ICD-10-CM | POA: Diagnosis not present

## 2022-09-29 DIAGNOSIS — Z6831 Body mass index (BMI) 31.0-31.9, adult: Secondary | ICD-10-CM | POA: Diagnosis not present

## 2022-09-29 DIAGNOSIS — Z113 Encounter for screening for infections with a predominantly sexual mode of transmission: Secondary | ICD-10-CM | POA: Diagnosis not present

## 2022-09-29 MED ORDER — WEGOVY 0.5 MG/0.5ML ~~LOC~~ SOAJ: 0.5000 mg | SUBCUTANEOUS | 0 refills | Status: DC

## 2022-09-29 NOTE — Assessment & Plan Note (Signed)
She has cut back to social drinking

## 2022-10-01 LAB — NUSWAB VAGINITIS PLUS (VG+)
Atopobium vaginae: HIGH Score — AB
BVAB 2: HIGH Score — AB
Candida albicans, NAA: NEGATIVE
Candida glabrata, NAA: NEGATIVE
Chlamydia trachomatis, NAA: NEGATIVE
Neisseria gonorrhoeae, NAA: NEGATIVE
Trich vag by NAA: NEGATIVE

## 2022-10-11 ENCOUNTER — Other Ambulatory Visit: Payer: Self-pay | Admitting: Nurse Practitioner

## 2022-10-11 DIAGNOSIS — Z0000 Encounter for general adult medical examination without abnormal findings: Secondary | ICD-10-CM | POA: Insufficient documentation

## 2022-10-11 DIAGNOSIS — B9689 Other specified bacterial agents as the cause of diseases classified elsewhere: Secondary | ICD-10-CM

## 2022-10-11 DIAGNOSIS — Z720 Tobacco use: Secondary | ICD-10-CM | POA: Insufficient documentation

## 2022-10-11 MED ORDER — METRONIDAZOLE 500 MG PO TABS: 500.0000 mg | ORAL_TABLET | Freq: Three times a day (TID) | ORAL | 0 refills | Status: AC

## 2022-10-11 NOTE — Assessment & Plan Note (Signed)
Behavior modifications discussed and diet history reviewed.   Pt will continue to exercise regularly and modify diet with low GI, plant based foods and decrease intake of processed foods.  Recommend intake of daily multivitamin, Vitamin D, and calcium.  Recommend self breast exams monthly for preventive screenings, as well as recommend immunizations that include influenza, TDAP  

## 2022-10-11 NOTE — Assessment & Plan Note (Signed)
I have encouraged her to focusing on quitting smoking.

## 2022-10-11 NOTE — Assessment & Plan Note (Signed)
She would like to see GYN for fertility testing.

## 2022-10-11 NOTE — Assessment & Plan Note (Signed)
Influenza vaccine administered Encouraged to take Tylenol as needed for fever or muscle aches.

## 2022-10-11 NOTE — Assessment & Plan Note (Addendum)
She is encouraged to strive for BMI less than 30 to decrease cardiac risk.  Will see if she can get approved for [facility], discussed side effects. If you have any stomach pain or difficulty swallowing call to office. Once approved she will call office for teaching. She will need to come in for a sample 0.25 mg once approved Wegovy may cause nausea allow time for this to improve Goal to lose 1-2 lbs per week Increase your physical activity and incorporate 2 days of strength training.

## 2022-11-09 ENCOUNTER — Encounter (HOSPITAL_COMMUNITY): Payer: Self-pay | Admitting: Emergency Medicine

## 2022-11-09 ENCOUNTER — Ambulatory Visit (HOSPITAL_COMMUNITY)
Admission: EM | Admit: 2022-11-09 | Discharge: 2022-11-09 | Disposition: A | Discharge status: Home / Self Care | Payer: Medicaid Other | Attending: Family Medicine | Admitting: Family Medicine

## 2022-11-09 DIAGNOSIS — G4486 Cervicogenic headache: Secondary | ICD-10-CM

## 2022-11-09 DIAGNOSIS — M542 Cervicalgia: Secondary | ICD-10-CM | POA: Diagnosis not present

## 2022-11-09 MED ORDER — KETOROLAC TROMETHAMINE 30 MG/ML IJ SOLN
30.0000 mg | Freq: Once | INTRAMUSCULAR | Status: AC
  Administered 2022-11-09: 30 mg via INTRAMUSCULAR

## 2022-11-09 MED ORDER — KETOROLAC TROMETHAMINE 30 MG/ML IJ SOLN
INTRAMUSCULAR | Status: AC
  Filled 2022-11-09: qty 1

## 2022-11-09 MED ORDER — TIZANIDINE HCL 4 MG PO TABS: 4.0000 mg | ORAL_TABLET | Freq: Every evening | ORAL | 0 refills | Status: DC | PRN

## 2022-11-09 MED ORDER — KETOROLAC TROMETHAMINE 10 MG PO TABS: 10.0000 mg | ORAL_TABLET | Freq: Four times a day (QID) | ORAL | 0 refills | Status: DC | PRN
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
## 2022-11-09 NOTE — Discharge Instructions (Signed)
You have been given a shot of Toradol 30 mg today.  Ketorolac 10 mg tablets--take 1 tablet every 6 hours as needed for pain.  This is the same medicine that is in the shot we just gave you   Take tizanidine 4 mg--1  in the evening at bedtime as needed for muscle spasms; this medication can cause dizziness and sleepiness
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
## 2022-11-17 NOTE — Progress Notes (Deleted)
Madelaine Bhat, CMA,acting as a Neurosurgeon for Arnette Felts, FNP.,have documented all relevant documentation on the behalf of Arnette Felts, FNP,as directed by  Arnette Felts, FNP while in the presence of Arnette Felts, FNP.  Subjective:  Patient ID: Dawn Brock , female    DOB: [birth year] , 37 y.o.   MRN: [id number]  No chief complaint on file.   HPI  HPI   Past Medical History:  Diagnosis Date   Abnormal Pap smear    Anemia    Gastritis    Hx of chlamydia infection    Vaginal Pap smear, abnormal      Family History  Problem Relation Age of Onset   Hypertension Mother    Fibromyalgia Mother    Cirrhosis Father    Kidney disease Father    Asthma Brother    Birth defects Son        hirschprungs     Current Outpatient Medications:    ketorolac (TORADOL) 10 MG tablet, Take 1 tablet (10 mg total) by mouth every 6 (six) hours as needed (pain)., Disp: 20 tablet, Rfl: 0   Semaglutide-Weight Management (WEGOVY) 0.5 MG/0.5ML SOAJ, Inject 0.5 mg into the skin once a week., Disp: 2 mL, Rfl: 0   tiZANidine (ZANAFLEX) 4 MG tablet, Take 1 tablet (4 mg total) by mouth at bedtime as needed for muscle spasms., Disp: 10 tablet, Rfl: 0   Allergies  Allergen Reactions   Tramadol Nausea And Vomiting     Review of Systems   There were no vitals filed for this visit. There is no height or weight on file to calculate BMI.  Wt Readings from Last 3 Encounters:  09/29/22 204 lb 3.2 oz (92.6 kg)  08/09/22 200 lb (90.7 kg)  09/24/21 203 lb (92.1 kg)    The ASCVD Risk score (Arnett DK, et al., 2019) failed to calculate for the following reasons:   The 2019 ASCVD risk score is only valid for ages 40 to 60  Objective:  Physical Exam      Assessment And Plan:  There are no diagnoses linked to this encounter.  No follow-ups on file.  Patient was given opportunity to ask questions. Patient verbalized understanding of the plan and was able to repeat key elements of the plan. All  questions were answered to their satisfaction.    Jeanell Sparrow, FNP, have reviewed all documentation for this visit. The documentation on 11/17/22 for the exam, diagnosis, procedures, and orders are all accurate and complete.   IF YOU HAVE BEEN REFERRED TO A SPECIALIST, IT MAY TAKE 1-2 WEEKS TO SCHEDULE/PROCESS THE REFERRAL. IF YOU HAVE NOT HEARD FROM US/SPECIALIST IN TWO WEEKS, PLEASE GIVE [place] A CALL AT [phone].

## 2022-11-18 ENCOUNTER — Ambulatory Visit: Payer: Medicaid Other | Admitting: Nurse Practitioner

## 2022-12-01 ENCOUNTER — Telehealth: Payer: Self-pay | Admitting: Family Medicine

## 2022-12-01 NOTE — Telephone Encounter (Signed)
Patient called in to get her appointment scheduled for the referral that was sent to [place]. Informed patient I will call her in December to get her scheduled for January.

## 2022-12-02 DIAGNOSIS — M2041 Other hammer toe(s) (acquired), right foot: Secondary | ICD-10-CM | POA: Diagnosis not present

## 2022-12-10 ENCOUNTER — Encounter (HOSPITAL_COMMUNITY): Payer: Self-pay

## 2022-12-10 ENCOUNTER — Other Ambulatory Visit: Payer: Self-pay

## 2022-12-10 ENCOUNTER — Emergency Department (HOSPITAL_COMMUNITY)
Admission: EM | Admit: 2022-12-10 | Discharge: 2022-12-10 | Disposition: A | Discharge status: Home / Self Care | Payer: Medicaid Other | Attending: Emergency Medicine | Admitting: Emergency Medicine

## 2022-12-10 DIAGNOSIS — R059 Cough, unspecified: Secondary | ICD-10-CM | POA: Diagnosis present

## 2022-12-10 DIAGNOSIS — Z1152 Encounter for screening for COVID-19: Secondary | ICD-10-CM | POA: Diagnosis not present

## 2022-12-10 DIAGNOSIS — J069 Acute upper respiratory infection, unspecified: Secondary | ICD-10-CM | POA: Diagnosis not present

## 2022-12-10 LAB — SARS CORONAVIRUS 2 BY RT PCR: SARS Coronavirus 2 by RT PCR: NEGATIVE

## 2022-12-10 NOTE — ED Triage Notes (Signed)
Pt came in via POV d/t beginning to having cold s/s beginning last night. States she began having congestion, sneezing with decreased smell/taste. A/Ox4, states she is concerned she has covid.

## 2022-12-10 NOTE — Discharge Instructions (Signed)
You can follow the result of the COVID test on MyChart.

## 2022-12-10 NOTE — ED Provider Notes (Signed)
  [place] [facility] DE[facility]MENT AT [facility] Provider Note   CSN: [id number] Arrival date & time: 12/10/22  7425     History  Chief Complaint  Patient presents with   Cold s/s    Dawn Brock is a 37 y.o. female.  HPI Patient with URI symptoms.  Cough.  Began yesterday.  Has had some sick contacts but was about 2 weeks ago.  Patient is worried she could have COVID.  Some congestion nasally and ear congestion.  No fevers.  States her boyfriend was worried she could have COVID.   Past Medical History:  Diagnosis Date   Abnormal Pap smear    Anemia    Gastritis    Hx of chlamydia infection    Vaginal Pap smear, abnormal     Home Medications Prior to Admission medications   Medication Sig Start Date End Date Taking? Authorizing Provider  ketorolac (TORADOL) 10 MG tablet Take 1 tablet (10 mg total) by mouth every 6 (six) hours as needed (pain). 11/09/22   Zenia Resides, MD  Semaglutide-Weight Management (WEGOVY) 0.5 MG/0.5ML SOAJ Inject 0.5 mg into the skin once a week. 09/29/22   Arnette Felts, FNP  tiZANidine (ZANAFLEX) 4 MG tablet Take 1 tablet (4 mg total) by mouth at bedtime as needed for muscle spasms. 11/09/22   Zenia Resides, MD      Allergies    Tramadol    Review of Systems   Review of Systems  Physical Exam Updated Vital Signs BP (!) 135/90 (BP Location: Right Arm)   Pulse 89   Temp 98.1 F (36.7 C) (Oral)   Resp 20   Ht 5\' 7"  (1.702 m)   Wt 94.8 kg   SpO2 99%   BMI 32.73 kg/m  Physical Exam Vitals and nursing note reviewed.  HENT:     Mouth/Throat:     Pharynx: Posterior oropharyngeal erythema present.  Cardiovascular:     Rate and Rhythm: Regular rhythm.  Pulmonary:     Breath sounds: No wheezing or rhonchi.  Abdominal:     Tenderness: There is no abdominal tenderness.  Neurological:     Mental Status: She is alert.     ED Results / Procedures / Treatments   Labs (all labs ordered are listed, but only  abnormal results are displayed) Labs Reviewed  SARS CORONAVIRUS 2 BY RT PCR    EKG None  Radiology No results found.  Procedures Procedures    Medications Ordered in ED Medications - No data to display  ED Course/ Medical Decision Making/ A&P                                 Medical Decision Making  Patient with URI symptoms.  Cough.  Lungs are clear.  Doubt pneumonia.  COVID is considered however.  Discussed with patient and she would not want Paxil for treatment.  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  Will wear mask.  COVID test will be done and she will follow-up MyChart.  Will discharge home however        Final Clinical Impression(s) / ED Diagnoses Final diagnoses:  Upper respiratory tract infection, unspecified type    Rx / DC Orders ED Discharge Orders     None         Benjiman Core, MD 12/10/22 1021

## 2022-12-29 DIAGNOSIS — M2041 Other hammer toe(s) (acquired), right foot: Secondary | ICD-10-CM | POA: Diagnosis not present

## 2023-01-11 DIAGNOSIS — M2041 Other hammer toe(s) (acquired), right foot: Secondary | ICD-10-CM | POA: Diagnosis not present

## 2023-01-20 DIAGNOSIS — M2041 Other hammer toe(s) (acquired), right foot: Secondary | ICD-10-CM | POA: Diagnosis not present

## 2023-02-12 DIAGNOSIS — M2041 Other hammer toe(s) (acquired), right foot: Secondary | ICD-10-CM | POA: Diagnosis not present

## 2023-04-12 ENCOUNTER — Emergency Department (HOSPITAL_COMMUNITY)

## 2023-04-12 ENCOUNTER — Other Ambulatory Visit: Payer: Self-pay

## 2023-04-12 ENCOUNTER — Emergency Department (HOSPITAL_COMMUNITY)
Admission: EM | Admit: 2023-04-12 | Discharge: 2023-04-12 | Disposition: A | Discharge status: Home / Self Care | Attending: Emergency Medicine | Admitting: Emergency Medicine

## 2023-04-12 ENCOUNTER — Encounter (HOSPITAL_COMMUNITY): Payer: Self-pay | Admitting: Emergency Medicine

## 2023-04-12 DIAGNOSIS — M79652 Pain in left thigh: Secondary | ICD-10-CM | POA: Diagnosis not present

## 2023-04-12 DIAGNOSIS — M7989 Other specified soft tissue disorders: Secondary | ICD-10-CM | POA: Diagnosis not present

## 2023-04-12 DIAGNOSIS — W57XXXA Bitten or stung by nonvenomous insect and other nonvenomous arthropods, initial encounter: Secondary | ICD-10-CM | POA: Diagnosis not present

## 2023-04-12 DIAGNOSIS — M25562 Pain in left knee: Secondary | ICD-10-CM | POA: Diagnosis present

## 2023-04-12 MED ORDER — MELOXICAM 7.5 MG PO TABS: 15.0000 mg | ORAL_TABLET | Freq: Every day | ORAL | 0 refills | Status: DC

## 2023-04-12 MED ORDER — IBUPROFEN 800 MG PO TABS
800.0000 mg | ORAL_TABLET | Freq: Once | ORAL | Status: AC
  Administered 2023-04-12: 800 mg via ORAL
  Filled 2023-04-12: qty 1

## 2023-04-12 NOTE — Discharge Instructions (Addendum)
 X-ray is normal. Take the prescribed medication as directed.  Can wear knee brace for now to keep swelling down.  Try to keep leg elevated when you can. Follow-up with your primary care doctor and/or your orthopedist, Dr. Susa Simmonds. Return to the ED for new or worsening symptoms.

## 2023-04-12 NOTE — ED Triage Notes (Signed)
 Patient reports she thinks she got bit by something while walking yesterday on her left upper thigh.  Patient has redness and swelling to right upper thigh.

## 2023-04-12 NOTE — ED Provider Notes (Signed)
 [place] [facility] AT [facility] Provider Note   CSN: [id number] Arrival date & time: 04/12/23  0016     History  Chief Complaint  Patient presents with   Insect Bite    Dawn Brock is a 38 y.o. female.  The history is provided by the patient and medical records.   38 y.o. F here with left knee pain.  Got bit by something yesterday while walking for exercise which she just started doing.  States she walked about 1 mile or so. Today has felt a lot of pain in her knee and has noticed some swelling.  She denies fever/chills.  No intervention tried PTA.  Concerned because she starts a new job tomorrow and does not feel like she can stand.  Home Medications Prior to Admission medications   Medication Sig Start Date End Date Taking? Authorizing Provider  meloxicam (MOBIC) 7.5 MG tablet Take 2 tablets (15 mg total) by mouth daily. 04/12/23  Yes Garlon Hatchet, PA-C  ketorolac (TORADOL) 10 MG tablet Take 1 tablet (10 mg total) by mouth every 6 (six) hours as needed (pain). 11/09/22   Zenia Resides, MD  Semaglutide-Weight Management (WEGOVY) 0.5 MG/0.5ML SOAJ Inject 0.5 mg into the skin once a week. 09/29/22   Arnette Felts, FNP  tiZANidine (ZANAFLEX) 4 MG tablet Take 1 tablet (4 mg total) by mouth at bedtime as needed for muscle spasms. 11/09/22   Zenia Resides, MD      Allergies    Tramadol    Review of Systems   Review of Systems  Musculoskeletal:  Positive for arthralgias.  All other systems reviewed and are negative.   Physical Exam Updated Vital Signs BP 131/81 (BP Location: Right Arm)   Pulse (!) 108   Temp 98.3 F (36.8 C) (Oral)   Resp 18   Wt 90.7 kg   SpO2 99%   BMI 31.32 kg/m   Physical Exam Vitals and nursing note reviewed.  Constitutional:      Appearance: She is well-developed.  HENT:     Head: Normocephalic and atraumatic.  Eyes:     Conjunctiva/sclera: Conjunctivae normal.     Pupils: Pupils are equal,  round, and reactive to light.  Cardiovascular:     Rate and Rhythm: Normal rate and regular rhythm.     Heart sounds: Normal heart sounds.  Pulmonary:     Effort: Pulmonary effort is normal. No respiratory distress.     Breath sounds: Normal breath sounds. No rhonchi.  Musculoskeletal:        General: Normal range of motion.     Cervical back: Normal range of motion.     Comments: Left knee with some swelling along lateral aspect; is able to walk but is limping, able to flex/extend, no erythema of the joint Bug bite is far more proximal in the medial thigh area, appears to be excoriations but I do not appreciate any localized swelling here or erythema, no streaking  Skin:    General: Skin is warm and dry.  Neurological:     Mental Status: She is alert and oriented to person, place, and time.     ED Results / Procedures / Treatments   Labs (all labs ordered are listed, but only abnormal results are displayed) Labs Reviewed - No data to display  EKG None  Radiology DG Knee Complete 4 Views Left Result Date: 04/12/2023 CLINICAL DATA:  Left knee injury, pain EXAM: LEFT KNEE - COMPLETE 4+ VIEW  COMPARISON:  None Available. FINDINGS: No evidence of fracture, dislocation, or joint effusion. No evidence of arthropathy or other focal bone abnormality. Soft tissues are unremarkable. IMPRESSION: Negative. Electronically Signed   By: Helyn Numbers M.D.   On: 04/12/2023 02:07    Procedures Procedures    Medications Ordered in ED Medications  ibuprofen (ADVIL) tablet 800 mg (800 mg Oral Given 04/12/23 0157)    ED Course/ Medical Decision Making/ A&P                                 Medical Decision Making Amount and/or Complexity of Data Reviewed Radiology: ordered and independent interpretation performed.  Risk Prescription drug management.   38 year old female presenting to the ED with left knee pain and swelling.  [place] something on her [place] thought maybe that was caused.  She also started walking for exercise, walk a mile or so yesterday which she has not done in quite some time.  She does have some swelling about the left knee, mostly along the lateral aspect.  I do not appreciate any significant effusion.  She is able to range the knee and ambulate but with a slight limp.  Bug bite appears more like excoriations to left medial thigh and much more proximal. Does not really seem related to her knee swelling.  She denies prior history of knee issues however on chart review it appears she has previously had issues with left knee in the past.  Has had multiple x-rays and MRI 2022 with Dr. Lajoyce Corners with MCL sprain.    X-ray today without acute findings.  I suspect this is likely brought on from exercise yesterday.  No signs/symptoms concerning for septic joint, DVT, etc.  Will place in knee sleeve, meloxicam for home (most recent SrCr was normal).  Can follow-up with PCP, she is also established with orthopedics if needed.  Return here for new concerns.  Final Clinical Impression(s) / ED Diagnoses Final diagnoses:  Acute pain of left knee    Rx / DC Orders ED Discharge Orders          Ordered    meloxicam (MOBIC) 7.5 MG tablet  Daily        04/12/23 0211              Garlon Hatchet, PA-C 04/12/23 0232    Tilden Fossa, MD 04/12/23 [phone]

## 2023-04-27 ENCOUNTER — Encounter: Payer: Self-pay | Admitting: Nurse Practitioner

## 2023-04-27 ENCOUNTER — Ambulatory Visit: Admitting: Nurse Practitioner

## 2023-04-27 VITALS — BP 110/70 | HR 91 | Temp 98.4°F | Ht 67.0 in | Wt 221.0 lb

## 2023-04-27 DIAGNOSIS — E66811 Obesity, class 1: Secondary | ICD-10-CM

## 2023-04-27 DIAGNOSIS — R221 Localized swelling, mass and lump, neck: Secondary | ICD-10-CM

## 2023-04-27 DIAGNOSIS — R300 Dysuria: Secondary | ICD-10-CM

## 2023-04-27 DIAGNOSIS — R051 Acute cough: Secondary | ICD-10-CM

## 2023-04-27 DIAGNOSIS — Z6834 Body mass index (BMI) 34.0-34.9, adult: Secondary | ICD-10-CM

## 2023-04-27 DIAGNOSIS — R350 Frequency of micturition: Secondary | ICD-10-CM | POA: Diagnosis not present

## 2023-04-27 DIAGNOSIS — E6609 Other obesity due to excess calories: Secondary | ICD-10-CM

## 2023-04-27 LAB — POCT URINALYSIS DIPSTICK
Bilirubin, UA: NEGATIVE
Glucose, UA: NEGATIVE
Ketones, UA: NEGATIVE
Leukocytes, UA: NEGATIVE
Nitrite, UA: NEGATIVE
Protein, UA: POSITIVE — AB
Spec Grav, UA: 1.025 (ref 1.010–1.025)
Urobilinogen, UA: 0.2 U/dL
pH, UA: 7 (ref 5.0–8.0)

## 2023-04-27 MED ORDER — FLUCONAZOLE 100 MG PO TABS: 100.0000 mg | ORAL_TABLET | Freq: Every day | ORAL | 0 refills | Status: DC

## 2023-04-27 MED ORDER — AMOXICILLIN 875 MG PO TABS: 875.0000 mg | ORAL_TABLET | Freq: Two times a day (BID) | ORAL | 0 refills | Status: DC

## 2023-04-27 NOTE — Progress Notes (Signed)
 I,Jameka J Llittleton, CMA,acting as a Neurosurgeon for [employer], FNP.,have documented all relevant documentation on the behalf of Susanna Epley, FNP,as directed by  Susanna Epley, FNP while in the presence of Susanna Epley, FNP.  Subjective:  Patient ID: Dawn Brock , female    DOB: May 11, 1985 , 38 y.o.   MRN: [id number]  Chief Complaint  Patient presents with   Dysuria   URI    HPI  Patient presents for cold symptoms. She reports her symptoms started last Thursday or Friday. Chest congestion, productive with yellow color. Patient reports she has been drinking herbal teas and ginger teas. She took robitussin and cough drops.   Patient also feels like she may have a uti. She has been urinating more than usual. She denies having any pain while urinating.   She reports her mother was diagnosed with stage 4 bone cancer.   URI  This is a new problem. The current episode started in the past 7 days. The problem has been unchanged. There has been no fever. Associated symptoms include congestion, coughing and rhinorrhea. Pertinent negatives include no sinus pain. She has tried decongestant for the symptoms. The treatment provided no relief.     Past Medical History:  Diagnosis Date   Abnormal Pap smear    Anemia    Gastritis    Hx of chlamydia infection    Vaginal Pap smear, abnormal      Family History  Problem Relation Age of Onset   Hypertension Mother    Fibromyalgia Mother    Cirrhosis Father    Kidney disease Father    Asthma Brother    Birth defects Son        hirschprungs     Current Outpatient Medications:    amoxicillin  (AMOXIL ) 875 MG tablet, Take 1 tablet (875 mg total) by mouth 2 (two) times daily., Disp: 14 tablet, Rfl: 0   fluconazole  (DIFLUCAN ) 100 MG tablet, Take 1 tablet (100 mg total) by mouth daily. Take 1 tablet by mouth now repeat in 5 days, Disp: 2 tablet, Rfl: 0   ketorolac  (TORADOL ) 10 MG tablet, Take 1 tablet (10 mg total) by mouth every 6 (six)  hours as needed (pain). (Patient not taking: Reported on 04/27/2023), Disp: 20 tablet, Rfl: 0   meloxicam  (MOBIC ) 7.5 MG tablet, Take 2 tablets (15 mg total) by mouth daily. (Patient not taking: Reported on 04/27/2023), Disp: 14 tablet, Rfl: 0   Semaglutide -Weight Management (WEGOVY ) 0.5 MG/0.5ML SOAJ, Inject 0.5 mg into the skin once a week. (Patient not taking: Reported on 04/27/2023), Disp: 2 mL, Rfl: 0   tiZANidine  (ZANAFLEX ) 4 MG tablet, Take 1 tablet (4 mg total) by mouth at bedtime as needed for muscle spasms. (Patient not taking: Reported on 04/27/2023), Disp: 10 tablet, Rfl: 0   Allergies  Allergen Reactions   Tramadol  Nausea And Vomiting     Review of Systems  Constitutional: Negative.   HENT:  Positive for congestion and rhinorrhea. Negative for sinus pain.   Respiratory:  Positive for cough.   Cardiovascular: Negative.   Neurological: Negative.   Psychiatric/Behavioral: Negative.       Today's Vitals   04/27/23 1051  BP: 110/70  Pulse: 91  Temp: 98.4 F (36.9 C)  TempSrc: Oral  Weight: 221 lb (100.2 kg)  Height: 5\' 7"  (1.702 m)  PainSc: 0-No pain   Body mass index is 34.61 kg/m.  Wt Readings from Last 3 Encounters:  04/27/23 221 lb (100.2 kg)  04/12/23 200 lb (  90.7 kg)  12/10/22 209 lb (94.8 kg)    Objective:  Physical Exam Vitals and nursing note reviewed.  Constitutional:      General: She is not in acute distress.    Appearance: Normal appearance. She is well-developed. She is obese.  HENT:     Head: Normocephalic and atraumatic.     Right Ear: Tympanic membrane, ear canal and external ear normal. There is no impacted cerumen.     Left Ear: Tympanic membrane, ear canal and external ear normal. There is no impacted cerumen.     Nose: Nose normal.     Mouth/Throat:     Mouth: Mucous membranes are moist.  Eyes:     Pupils: Pupils are equal, round, and reactive to light.  Cardiovascular:     Rate and Rhythm: Normal rate and regular rhythm.     Pulses:  Normal pulses.     Heart sounds: Normal heart sounds. No murmur heard. Pulmonary:     Effort: Pulmonary effort is normal. No respiratory distress.     Breath sounds: Normal breath sounds. No wheezing.  Musculoskeletal:        General: Normal range of motion.  Lymphadenopathy:     Cervical: Cervical adenopathy (left posterior cervical chain has a lump present firm) present.  Skin:    General: Skin is warm and dry.     Capillary Refill: Capillary refill takes less than 2 seconds.  Neurological:     General: No focal deficit present.     Mental Status: She is alert and oriented to person, place, and time.     Cranial Nerves: No cranial nerve deficit.  Psychiatric:        Mood and Affect: Mood normal.      Assessment And Plan:  Acute cough Assessment & Plan: Will treat with amoxicillin  due to productive cough.   Orders: -     Amoxicillin ; Take 1 tablet (875 mg total) by mouth 2 (two) times daily.  Dispense: 14 tablet; Refill: 0 -     CBC with Differential/Platelet  Dysuria Assessment & Plan: Will send urine culture.   Orders: -     POCT urinalysis dipstick  Urinary frequency -     Urine Culture -     Hemoglobin A1c -     BMP8+eGFR  Lump in neck Assessment & Plan: Has cervical lymphadenopathy will check ultrasound of neck  Orders: -     US  SOFT TISSUE HEAD & NECK (NON-THYROID); Future  Class 1 obesity due to excess calories without serious comorbidity with body mass index (BMI) of 34.0 to 34.9 in adult Assessment & Plan: She is encouraged to strive for BMI less than 30 to decrease cardiac risk. Advised to aim for at least 150 minutes of exercise per week.    Other orders -     Fluconazole ; Take 1 tablet (100 mg total) by mouth daily. Take 1 tablet by mouth now repeat in 5 days  Dispense: 2 tablet; Refill: 0    Return if symptoms worsen or fail to improve.  Patient was given opportunity to ask questions. Patient verbalized understanding of the plan and was able  to repeat key elements of the plan. All questions were answered to their satisfaction.   Inge Mangle, FNP, have reviewed all documentation for this visit. The documentation on 04/27/23 for the exam, diagnosis, procedures, and orders are all accurate and complete.   IF YOU HAVE BEEN REFERRED TO A SPECIALIST, IT MAY TAKE 1-2 WEEKS  TO SCHEDULE/PROCESS THE REFERRAL. IF YOU HAVE NOT HEARD FROM US /SPECIALIST IN TWO WEEKS, PLEASE GIVE US  A CALL AT [phone].

## 2023-04-27 NOTE — Patient Instructions (Signed)
 Stay well hydrated with water and you can drink hot tea with lemon and honey

## 2023-04-28 LAB — CBC WITH DIFFERENTIAL/PLATELET
Basophils Absolute: 0.1 10*3/uL (ref 0.0–0.2)
Basos: 1 %
EOS (ABSOLUTE): 0.1 10*3/uL (ref 0.0–0.4)
Eos: 1 %
Hematocrit: 40.7 % (ref 34.0–46.6)
Hemoglobin: 13.3 g/dL (ref 11.1–15.9)
Immature Grans (Abs): 0 10*3/uL (ref 0.0–0.1)
Immature Granulocytes: 0 %
Lymphocytes Absolute: 3 10*3/uL (ref 0.7–3.1)
Lymphs: 34 %
MCH: 31.2 pg (ref 26.6–33.0)
MCHC: 32.7 g/dL (ref 31.5–35.7)
MCV: 96 fL (ref 79–97)
Monocytes Absolute: 0.7 10*3/uL (ref 0.1–0.9)
Monocytes: 7 %
Neutrophils Absolute: 5.1 10*3/uL (ref 1.4–7.0)
Neutrophils: 57 %
Platelets: 304 10*3/uL (ref 150–450)
RBC: 4.26 x10E6/uL (ref 3.77–5.28)
RDW: 11.8 % (ref 11.7–15.4)
WBC: 8.9 10*3/uL (ref 3.4–10.8)

## 2023-04-28 LAB — BMP8+EGFR
BUN/Creatinine Ratio: 14 (ref 9–23)
BUN: 10 mg/dL (ref 6–20)
CO2: 21 mmol/L (ref 20–29)
Calcium: 9.7 mg/dL (ref 8.7–10.2)
Chloride: 102 mmol/L (ref 96–106)
Creatinine, Ser: 0.7 mg/dL (ref 0.57–1.00)
Glucose: 93 mg/dL (ref 70–99)
Potassium: 4.4 mmol/L (ref 3.5–5.2)
Sodium: 137 mmol/L (ref 134–144)
eGFR: 113 mL/min/{1.73_m2} (ref >59)

## 2023-04-28 LAB — HEMOGLOBIN A1C
Est. average glucose Bld gHb Est-mCnc: 123 mg/dL
Hgb A1c MFr Bld: 5.9 % — ABNORMAL HIGH (ref 4.8–5.6)

## 2023-04-29 LAB — URINE CULTURE

## 2023-05-04 ENCOUNTER — Encounter: Payer: Self-pay | Admitting: Physical Therapy

## 2023-05-04 ENCOUNTER — Ambulatory Visit: Admission: RE | Admit: 2023-05-04 | Source: Ambulatory Visit

## 2023-05-04 ENCOUNTER — Ambulatory Visit: Attending: Orthopedic Surgery | Admitting: Physical Therapy

## 2023-05-04 ENCOUNTER — Other Ambulatory Visit: Payer: Self-pay

## 2023-05-04 DIAGNOSIS — M25562 Pain in left knee: Secondary | ICD-10-CM | POA: Diagnosis present

## 2023-05-04 DIAGNOSIS — R600 Localized edema: Secondary | ICD-10-CM | POA: Insufficient documentation

## 2023-05-04 DIAGNOSIS — M6281 Muscle weakness (generalized): Secondary | ICD-10-CM | POA: Diagnosis present

## 2023-05-04 NOTE — Therapy (Signed)
 OUTPATIENT PHYSICAL THERAPY LOWER EXTREMITY EVALUATION   Patient Name: Dawn Brock MRN: [id number] DOB:Feb 05, 1985, 38 y.o., female Today's Date: 05/04/2023  END OF SESSION:  PT End of Session - 05/04/23 0845     Visit Number 1    Number of Visits 13    Date for PT Re-Evaluation 06/15/23    Authorization Type [employer]    Authorization Time Period 05/04/23 to 06/17/23    PT Start Time 0812    PT Stop Time 0842    PT Time Calculation (min) 30 min    Activity Tolerance Patient tolerated treatment well    Behavior During Therapy WFL for tasks assessed/performed             Past Medical History:  Diagnosis Date   Abnormal Pap smear    Anemia    Gastritis    Hx of chlamydia infection    Vaginal Pap smear, abnormal    Past Surgical History:  Procedure Laterality Date   HERNIA REPAIR     As an infant   LEEP     Patient Active Problem List   Diagnosis Date Noted   Encounter for annual health examination 10/11/2022   Tobacco use 10/11/2022   Fertility testing 09/29/2022   Class 1 obesity due to excess calories without serious comorbidity with body mass index (BMI) of 31.0 to 31.9 in adult 09/29/2022   Screening for STDs (sexually transmitted diseases) 09/29/2022   Need for influenza vaccination 09/29/2022   Tobacco abuse 09/29/2022   H/O LEEP 08/21/2016   Headache 08/21/2016   Mild tetrahydrocannabinol (THC) abuse 08/21/2016   History of substance abuse (HCC) 08/21/2016   Cocaine abuse complicating pregnancy (HCC) 02/22/2013   Alcohol dependence (HCC) 03/12/2012   Cannabis dependence (HCC) 03/12/2012   Executive function deficit 03/11/2012    Class: Acute   Cigarette smoker 03/10/2012   Duodenitis with nausea, vomiting, diarrhea and abdominal pain 03/10/2012    PCP: Parry Bolster FNP   REFERRING PROVIDER: Saundra Curl, MD  REFERRING DIAG:  Free Text Diagnosis  Lumbar, Lt Knee Pain    THERAPY DIAG:  Acute pain of left knee  Muscle  weakness (generalized)  Localized edema  Rationale for Evaluation and Treatment: Rehabilitation  ONSET DATE: end of march 2025  SUBJECTIVE:   SUBJECTIVE STATEMENT:  Doctor said that I may have a pinched nerve due to where the discomfort is in my leg. Do have a history of MCL injury when running 1-2 years ago. Did get an injection, doctor said keep an eye on things to see if the pain is going from my back to my knee or the opposite. Have been trying to exercise and I don't know if I did too much but walked and knee swole up and that led me to go to the doctor originally.   PERTINENT HISTORY: See above  PAIN:  Are you having pain? Yes: NPRS scale: 6-7/10 Pain location: L knee  Pain description: swelling,posterior and lateral knee  aching, stabbing  Aggravating factors: exercising, doing too much, steps  Relieving factors: sitting, resting, elevating leg   PRECAUTIONS: None  RED FLAGS: None   WEIGHT BEARING RESTRICTIONS: No  FALLS:  Has patient fallen in last 6 months? 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  DIAGNOSTIC FINDINGS:   Narrative & Impression  CLINICAL DATA:  Left knee injury, pain   EXAM: LEFT KNEE - COMPLETE 4+ VIEW   COMPARISON:  None Available.   FINDINGS: No evidence of fracture, dislocation, or joint effusion. No evidence of arthropathy or other focal bone abnormality. Soft tissues are unremarkable.   IMPRESSION: Negative.    PATIENT SURVEYS:  LEFS 32/80  COGNITION: Overall cognitive status: Within functional limits for tasks  assessed     SENSATION: Not tested  EDEMA:   Reports edema L knee posteriorly and laterally       LOWER EXTREMITY ROM:  Active ROM Right eval Left eval  Hip flexion    Hip extension    Hip abduction    Hip adduction    Hip internal rotation    Hip external rotation    Knee flexion  118*  Knee extension  -8* supine bolster under heel, hyperextension  Ankle dorsiflexion    Ankle plantarflexion    Ankle inversion    Ankle eversion     (Blank rows = not tested)  LOWER EXTREMITY MMT:  MMT Right eval Left eval  Hip flexion 5 4 knee pain   Hip extension 3+ 3  Hip abduction 4 3-  Hip adduction    Hip internal rotation    Hip external rotation    Knee flexion 5 4 pain   Knee extension 5 4 pain   Ankle dorsiflexion    Ankle plantarflexion    Ankle inversion    Ankle eversion     (Blank rows = not tested)                                                                                                                                 TREATMENT DATE:   05/04/23  Eval (LIMITED), HEP, POC     PATIENT EDUCATION:  Education details: exam, POC, HEP  Person educated: Patient Education method: Explanation, Demonstration, and Handouts Education comprehension: verbalized understanding, returned demonstration, and needs further education  HOME EXERCISE PROGRAM: Access Code: [email or web address] URL: [email or web address] Date: 05/04/2023 Prepared by: Terrel Ferries  Exercises - Supine Bridge with Resistance Band  - 1 x daily - 7 x weekly - 1-2 sets - 10 reps - Sidelying Hip Abduction with Resistance at Thighs  - 1 x daily - 7 x weekly - 1-2 sets - 10 reps - Supine Hamstring Stretch with Strap  - 1 x daily - 7 x weekly - 1 sets - 3 reps - 30 seconds  hold - Supine Active Straight Leg Raise  - 1 x daily - 7 x weekly - 1-2 sets - 10 reps  ASSESSMENT:  CLINICAL IMPRESSION: Patient is a 38 y.o. F who was seen today for physical therapy evaluation and treatment for  knee  and back pain. She arrived late and was very talkative so Pt evaluation was quite limited today unfortunately. See objectives above. I do not agree that she may have lumbar involvement, all sx appear related to her knee today. Will make every effort to meet her goals and assist her in return to exercise pain free.    OBJECTIVE IMPAIRMENTS: decreased mobility, difficulty walking, decreased strength, impaired flexibility, obesity, and pain.   ACTIVITY LIMITATIONS: standing, squatting, stairs, transfers, and locomotion level  PARTICIPATION LIMITATIONS: driving, shopping, community activity, occupation, and yard work  PERSONAL FACTORS: Age, Behavior pattern, Education, Fitness, Profession, Social background, and Time since onset of injury/illness/exacerbation are also affecting patient's functional outcome.   REHAB POTENTIAL: Fair chronicity of knee issues, poor health literacy   CLINICAL DECISION MAKING: Stable/uncomplicated  EVALUATION COMPLEXITY: Low   GOALS: Goals reviewed with patient? No  SHORT TERM GOALS: Target date: 05/25/2023   Will be compliant with appropriate HEP Baseline: Goal status: INITIAL  2.  Knee edema to have resolved due to independent management strategies  Baseline:  Goal status: INITIAL    LONG TERM GOALS: Target date: 06/15/2023    MMT to have improved by 1 grade all weak groups  Baseline:  Goal status: INITIAL  2.  Will be able to perform squat with good form to avoid aggravating knee  Baseline:  Goal status: INITIAL  3.  Will be able to exercise as desired with good form and correct use of machines/exercise progressions with no knee pain  Baseline:  Goal status: INITIAL  4.  Knee pain to be no more than 2/10 at worst  Baseline:  Goal status: INITIAL  5.  LEFS score to have improved by at least 10 points  Baseline:  Goal status: INITIAL     PLAN:  PT FREQUENCY: 2x/week  PT DURATION: 6 weeks  PLANNED INTERVENTIONS: 97750-  Physical Performance Testing, 97110-Therapeutic exercises, 97530- Therapeutic activity, 97112- Neuromuscular re-education, 97535- Self Care, and 22025- Manual therapy  PLAN FOR NEXT SESSION: general strength/conditioning, work on biomechanics for exercise (body weight and machines)  Terrel Ferries, PT, DPT 05/04/23 8:55 AM

## 2023-05-06 ENCOUNTER — Ambulatory Visit: Admitting: Physical Therapy

## 2023-05-07 ENCOUNTER — Other Ambulatory Visit

## 2023-05-07 ENCOUNTER — Encounter: Payer: Self-pay | Admitting: Nurse Practitioner

## 2023-05-07 DIAGNOSIS — R300 Dysuria: Secondary | ICD-10-CM | POA: Insufficient documentation

## 2023-05-07 DIAGNOSIS — R221 Localized swelling, mass and lump, neck: Secondary | ICD-10-CM | POA: Insufficient documentation

## 2023-05-07 DIAGNOSIS — R051 Acute cough: Secondary | ICD-10-CM | POA: Insufficient documentation

## 2023-05-07 DIAGNOSIS — R350 Frequency of micturition: Secondary | ICD-10-CM | POA: Insufficient documentation

## 2023-05-07 DIAGNOSIS — E6609 Other obesity due to excess calories: Secondary | ICD-10-CM | POA: Insufficient documentation

## 2023-05-07 NOTE — Assessment & Plan Note (Signed)
 Has cervical lymphadenopathy will check ultrasound of neck

## 2023-05-07 NOTE — Assessment & Plan Note (Signed)
Will send urine culture

## 2023-05-07 NOTE — Assessment & Plan Note (Signed)
 Will treat with amoxicillin  due to productive cough.

## 2023-05-07 NOTE — Assessment & Plan Note (Signed)
 She is encouraged to strive for BMI less than 30 to decrease cardiac risk. Advised to aim for at least 150 minutes of exercise per week.

## 2023-05-10 ENCOUNTER — Ambulatory Visit: Admitting: Physical Therapy

## 2023-05-11 ENCOUNTER — Inpatient Hospital Stay: Admission: RE | Admit: 2023-05-11 | Source: Ambulatory Visit

## 2023-05-17 ENCOUNTER — Encounter: Payer: Self-pay | Admitting: Physical Therapy

## 2023-05-17 ENCOUNTER — Ambulatory Visit: Attending: Nurse Practitioner | Admitting: Physical Therapy

## 2023-05-17 DIAGNOSIS — M6281 Muscle weakness (generalized): Secondary | ICD-10-CM | POA: Diagnosis present

## 2023-05-17 DIAGNOSIS — M25562 Pain in left knee: Secondary | ICD-10-CM | POA: Diagnosis present

## 2023-05-17 DIAGNOSIS — R600 Localized edema: Secondary | ICD-10-CM | POA: Insufficient documentation

## 2023-05-17 NOTE — Therapy (Signed)
 OUTPATIENT PHYSICAL THERAPY LOWER EXTREMITY EVALUATION   Patient Name: Dawn Brock MRN: [id number] DOB:1985/05/24, 38 y.o., female Today's Date: 05/17/2023  END OF SESSION:  PT End of Session - 05/17/23 0931     Visit Number 2    Date for PT Re-Evaluation 06/15/23    PT Start Time 0931    PT Stop Time 1015    PT Time Calculation (min) 44 min    Activity Tolerance Patient tolerated treatment well    Behavior During Therapy WFL for tasks assessed/performed             Past Medical History:  Diagnosis Date   Abnormal Pap smear    Anemia    Gastritis    Hx of chlamydia infection    Vaginal Pap smear, abnormal    Past Surgical History:  Procedure Laterality Date   HERNIA REPAIR     As an infant   LEEP     Patient Active Problem List   Diagnosis Date Noted   Acute cough 05/07/2023   Dysuria 05/07/2023   Urinary frequency 05/07/2023   Lump in neck 05/07/2023   Class 1 obesity due to excess calories without serious comorbidity with body mass index (BMI) of 34.0 to 34.9 in adult 05/07/2023   Encounter for annual health examination 10/11/2022   Tobacco use 10/11/2022   Fertility testing 09/29/2022   Class 1 obesity due to excess calories without serious comorbidity with body mass index (BMI) of 31.0 to 31.9 in adult 09/29/2022   Screening for STDs (sexually transmitted diseases) 09/29/2022   Need for influenza vaccination 09/29/2022   Tobacco abuse 09/29/2022   H/O LEEP 08/21/2016   Headache 08/21/2016   Mild tetrahydrocannabinol (THC) abuse 08/21/2016   History of substance abuse (HCC) 08/21/2016   Cocaine abuse complicating pregnancy (HCC) 02/22/2013   Alcohol dependence (HCC) 03/12/2012   Cannabis dependence (HCC) 03/12/2012   Executive function deficit 03/11/2012    Class: Acute   Cigarette smoker 03/10/2012   Duodenitis with nausea, vomiting, diarrhea and abdominal pain 03/10/2012    PCP: Parry Bolster FNP   REFERRING PROVIDER: Saundra Curl,  MD  REFERRING DIAG:  Free Text Diagnosis  Lumbar, Lt Knee Pain    THERAPY DIAG:  Acute pain of left knee  Muscle weakness (generalized)  Localized edema  Rationale for Evaluation and Treatment: Rehabilitation  ONSET DATE: end of march 2025  SUBJECTIVE:   SUBJECTIVE STATEMENT: Feel better overall, not popping as much going up stairs  Doctor said that I may have a pinched nerve due to where the discomfort is in my leg. Do have a history of MCL injury when running 1-2 years ago. Did get an injection, doctor said keep an eye on things to see if the pain is going from my back to my knee or the opposite. Have been trying to exercise and I don't know if I did too much but walked and knee swole up and that led me to go to the doctor originally.   PERTINENT HISTORY: See above  PAIN:  Are you having pain? Yes: NPRS scale: 0/10 Pain location: L knee  Pain description: swelling,posterior and lateral knee  aching, stabbing  Aggravating factors: exercising, doing too much, steps  Relieving factors: sitting, resting, elevating leg   PRECAUTIONS: None  RED FLAGS: None   WEIGHT BEARING RESTRICTIONS: No  FALLS:  Has patient fallen in last 6 months? 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  DIAGNOSTIC FINDINGS:   Narrative & Impression  CLINICAL DATA:  Left knee injury, pain   EXAM: LEFT KNEE - COMPLETE 4+ VIEW   COMPARISON:  None Available.   FINDINGS: No evidence of fracture, dislocation, or joint effusion. No evidence of arthropathy or other focal bone abnormality. Soft tissues  are unremarkable.   IMPRESSION: Negative.    PATIENT SURVEYS:  LEFS 32/80  COGNITION: Overall cognitive status: Within functional limits for tasks assessed     SENSATION: Not tested  EDEMA:   Reports edema L knee posteriorly and laterally       LOWER EXTREMITY ROM:  Active ROM Right eval Left eval  Hip flexion    Hip extension    Hip abduction    Hip adduction    Hip internal rotation    Hip external rotation    Knee flexion  118*  Knee extension  -8* supine bolster under heel, hyperextension  Ankle dorsiflexion    Ankle plantarflexion    Ankle inversion    Ankle eversion     (Blank rows = not tested)  LOWER EXTREMITY MMT:  MMT Right eval Left eval  Hip flexion 5 4 knee pain   Hip extension 3+ 3  Hip abduction 4 3-  Hip adduction    Hip internal rotation    Hip external rotation    Knee flexion 5 4 pain   Knee extension 5 4 pain   Ankle dorsiflexion    Ankle plantarflexion    Ankle inversion    Ankle eversion     (Blank rows = not tested)                                                                                                                                 TREATMENT DATE:  05/14/23 Bike L 3 x 6 min 30lb resisted gait 4 way x 3 each Step ups forward & lateral 6in x 10 each  Slant board calf stretch HS curls 25lb 2x10  Leg Ext 5lb 2x10 Sit to stands 2x10  05/04/23  Eval (LIMITED), HEP, POC     PATIENT EDUCATION:  Education details: exam, POC, HEP  Person educated: Patient Education method: Explanation, Demonstration, and Handouts Education comprehension: verbalized understanding, returned demonstration, and needs further education  HOME EXERCISE PROGRAM: Access Code: [email or web address] URL: [email or web address] Date: 05/04/2023 Prepared by: Terrel Ferries  Exercises - Supine Bridge with Resistance Band  - 1 x daily - 7 x weekly - 1-2 sets - 10 reps - Sidelying Hip Abduction with Resistance at Thighs  - 1 x daily - 7  x weekly - 1-2 sets -  10 reps - Supine Hamstring Stretch with Strap  - 1 x daily - 7 x weekly - 1 sets - 3 reps - 30 seconds  hold - Supine Active Straight Leg Raise  - 1 x daily - 7 x weekly - 1-2 sets - 10 reps  ASSESSMENT:  CLINICAL IMPRESSION: Pt enters doing well reporting less pain overall. Progressed session with functional strengthening interventions. She reports no pain during session only pulling form muscles working.  Increase fatigue with activity requiring multiple rest breaks. She reports that she will be going to the gym, I advised her not to do too much too soon. STG MET  Patient is a 38 y.o. F who was seen today for physical therapy evaluation and treatment for knee and back pain. She arrived late and was very talkative so Pt evaluation was quite limited today unfortunately. See objectives above. I do not agree that she may have lumbar involvement, all sx appear related to her knee today. Will make every effort to meet her goals and assist her in return to exercise pain free.    OBJECTIVE IMPAIRMENTS: decreased mobility, difficulty walking, decreased strength, impaired flexibility, obesity, and pain.   ACTIVITY LIMITATIONS: standing, squatting, stairs, transfers, and locomotion level  PARTICIPATION LIMITATIONS: driving, shopping, community activity, occupation, and yard work  PERSONAL FACTORS: Age, Behavior pattern, Education, Fitness, Profession, Social background, and Time since onset of injury/illness/exacerbation are also affecting patient's functional outcome.   REHAB POTENTIAL: Fair chronicity of knee issues, poor health literacy   CLINICAL DECISION MAKING: Stable/uncomplicated  EVALUATION COMPLEXITY: Low   GOALS: Goals reviewed with patient? No  SHORT TERM GOALS: Target date: 05/25/2023   Will be compliant with appropriate HEP Baseline: Goal status: Met 05/17/23  2.  Knee edema to have resolved due to independent management strategies  Baseline:  Goal status:  Met 05/17/23    LONG TERM GOALS: Target date: 06/15/2023    MMT to have improved by 1 grade all weak groups  Baseline:  Goal status: INITIAL  2.  Will be able to perform squat with good form to avoid aggravating knee  Baseline:  Goal status: INITIAL  3.  Will be able to exercise as desired with good form and correct use of machines/exercise progressions with no knee pain  Baseline:  Goal status: INITIAL  4.  Knee pain to be no more than 2/10 at worst  Baseline:  Goal status: INITIAL  5.  LEFS score to have improved by at least 10 points  Baseline:  Goal status: INITIAL     PLAN:  PT FREQUENCY: 2x/week  PT DURATION: 6 weeks  PLANNED INTERVENTIONS: 97750- Physical Performance Testing, 97110-Therapeutic exercises, 97530- Therapeutic activity, 97112- Neuromuscular re-education, 97535- Self Care, and 16109- Manual therapy  PLAN FOR NEXT SESSION: general strength/conditioning, work on biomechanics for exercise (body weight and machines)  Terrel Ferries, PT, DPT 05/17/23 9:32 AM

## 2023-05-19 ENCOUNTER — Ambulatory Visit: Admitting: Physical Therapy

## 2023-05-19 ENCOUNTER — Encounter: Payer: Self-pay | Admitting: Physical Therapy

## 2023-05-19 DIAGNOSIS — M6281 Muscle weakness (generalized): Secondary | ICD-10-CM

## 2023-05-19 DIAGNOSIS — R600 Localized edema: Secondary | ICD-10-CM

## 2023-05-19 DIAGNOSIS — M25562 Pain in left knee: Secondary | ICD-10-CM

## 2023-05-19 NOTE — Therapy (Signed)
 OUTPATIENT PHYSICAL THERAPY LOWER EXTREMITY TREATMENT    Patient Name: Dawn Brock MRN: [id number] DOB:May 02, 1985, 38 y.o., female Today's Date: 05/19/2023  END OF SESSION:  PT End of Session - 05/19/23 1024     Visit Number 3    Number of Visits 13    Date for PT Re-Evaluation 06/15/23    Authorization Type [employer]    Authorization Time Period 05/04/23 to 06/17/23    PT Start Time 1018    PT Stop Time 1058    PT Time Calculation (min) 40 min    Activity Tolerance Patient tolerated treatment well    Behavior During Therapy WFL for tasks assessed/performed              Past Medical History:  Diagnosis Date   Abnormal Pap smear    Anemia    Gastritis    Hx of chlamydia infection    Vaginal Pap smear, abnormal    Past Surgical History:  Procedure Laterality Date   HERNIA REPAIR     As an infant   LEEP     Patient Active Problem List   Diagnosis Date Noted   Acute cough 05/07/2023   Dysuria 05/07/2023   Urinary frequency 05/07/2023   Lump in neck 05/07/2023   Class 1 obesity due to excess calories without serious comorbidity with body mass index (BMI) of 34.0 to 34.9 in adult 05/07/2023   Encounter for annual health examination 10/11/2022   Tobacco use 10/11/2022   Fertility testing 09/29/2022   Class 1 obesity due to excess calories without serious comorbidity with body mass index (BMI) of 31.0 to 31.9 in adult 09/29/2022   Screening for STDs (sexually transmitted diseases) 09/29/2022   Need for influenza vaccination 09/29/2022   Tobacco abuse 09/29/2022   H/O LEEP 08/21/2016   Headache 08/21/2016   Mild tetrahydrocannabinol (THC) abuse 08/21/2016   History of substance abuse (HCC) 08/21/2016   Cocaine abuse complicating pregnancy (HCC) 02/22/2013   Alcohol dependence (HCC) 03/12/2012   Cannabis dependence (HCC) 03/12/2012   Executive function deficit 03/11/2012    Class: Acute   Cigarette smoker 03/10/2012   Duodenitis with nausea,  vomiting, diarrhea and abdominal pain 03/10/2012    PCP: Parry Bolster FNP   REFERRING PROVIDER: Saundra Curl, MD  REFERRING DIAG:  Free Text Diagnosis  Lumbar, Lt Knee Pain    THERAPY DIAG:  Acute pain of left knee  Muscle weakness (generalized)  Localized edema  Rationale for Evaluation and Treatment: Rehabilitation  ONSET DATE: end of march 2025  SUBJECTIVE:   SUBJECTIVE STATEMENT:  Went to the gym, did TM last time I was there. Want to try using other machines there, is that OK? Walked about a mile this morning and felt OK    EVAL: Doctor said that I may have a pinched nerve due to where the discomfort is in my leg. Do have a history of MCL injury when running 1-2 years ago. Did get an injection, doctor said keep an eye on things to see if the pain is going from my back to my knee or the opposite. Have been trying to exercise and I don't know if I did too much but walked and knee swole up and that led me to go to the doctor originally.   PERTINENT HISTORY: See above  PAIN:  Are you having pain? No 0/10  PRECAUTIONS: None  RED FLAGS: None   WEIGHT BEARING RESTRICTIONS: No  FALLS:  Has patient fallen in last  6 months? 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  DIAGNOSTIC FINDINGS:   Narrative & Impression  CLINICAL DATA:  Left knee injury, pain   EXAM: LEFT KNEE - COMPLETE 4+ VIEW   COMPARISON:  None Available.   FINDINGS: No evidence of fracture, dislocation, or joint effusion. No evidence of arthropathy or other focal bone abnormality. Soft tissues  are unremarkable.   IMPRESSION: Negative.    PATIENT SURVEYS:  LEFS 32/80  COGNITION: Overall cognitive status: Within functional limits for tasks assessed     SENSATION: Not tested  EDEMA:   Reports edema L knee posteriorly and laterally       LOWER EXTREMITY ROM:  Active ROM Right eval Left eval  Hip flexion    Hip extension    Hip abduction    Hip adduction    Hip internal rotation    Hip external rotation    Knee flexion  118*  Knee extension  -8* supine bolster under heel, hyperextension  Ankle dorsiflexion    Ankle plantarflexion    Ankle inversion    Ankle eversion     (Blank rows = not tested)  LOWER EXTREMITY MMT:  MMT Right eval Left eval  Hip flexion 5 4 knee pain   Hip extension 3+ 3  Hip abduction 4 3-  Hip adduction    Hip internal rotation    Hip external rotation    Knee flexion 5 4 pain   Knee extension 5 4 pain   Ankle dorsiflexion    Ankle plantarflexion    Ankle inversion    Ankle eversion     (Blank rows = not tested)                                                                                                                                 TREATMENT DATE:   05/19/23  Nustep L4 x8 minutes BLEs only  Staggered STS 2x5 B  Hip hikes 1x10 B Hip hikes + ABD 1x10 B Forward step ups 6 inch step x15 B  Lateral step ups 6 inch x15 B   Bridges + ABD into green TB x12  Sidelying hip ABD green TB x12 Walking bridges x7  HS stretches on steps (standing) 2x30 seconds B  Gastroc stretches 2x30 seconds B   Education on not overdoing at the gym, educated about Horticulturist, commercial (quads/hams/leg press) with limited reps, OK for TM and encouraged upper body strengthening as well but avoiding lower body resistance between PT sessions outside of HEP for now     05/14/23 Bike L  3 x 6 min 30lb resisted gait 4 way x 3 each Step ups forward & lateral 6in x 10 each  Slant board calf stretch HS curls 25lb 2x10  Leg Ext 5lb  2x10 Sit to stands 2x10  05/04/23  Eval (LIMITED), HEP, POC     PATIENT EDUCATION:  Education details: exam, POC, HEP  Person educated: Patient Education method: Explanation, Demonstration, and Handouts Education comprehension: verbalized understanding, returned demonstration, and needs further education  HOME EXERCISE PROGRAM: Access Code: [email or web address] URL: [email or web address] Date: 05/04/2023 Prepared by: Terrel Ferries  Exercises - Supine Bridge with Resistance Band  - 1 x daily - 7 x weekly - 1-2 sets - 10 reps - Sidelying Hip Abduction with Resistance at Thighs  - 1 x daily - 7 x weekly - 1-2 sets - 10 reps - Supine Hamstring Stretch with Strap  - 1 x daily - 7 x weekly - 1 sets - 3 reps - 30 seconds  hold - Supine Active Straight Leg Raise  - 1 x daily - 7 x weekly - 1-2 sets - 10 reps  ASSESSMENT:  CLINICAL IMPRESSION:  Pt arrives doing OK, sounds like she has been feeling well going to the gym, she has really only done the TM so far- encouraged some limited strengthening on quad/ham machines and leg press but with slow progression to avoid overdoing. Continued working on functional strengthening today with mixed focus on hip and knee musculature, with review of some HEP exercises with TB per her request. Tolerating PT very well so far.      Patient is a 38 y.o. F who was seen today for physical therapy evaluation and treatment for knee and back pain. She arrived late and was very talkative so Pt evaluation was quite limited today unfortunately. See objectives above. I do not agree that she may have lumbar involvement, all sx appear related to her knee today. Will make every effort to meet her goals and assist her in return to exercise pain free.    OBJECTIVE IMPAIRMENTS: decreased mobility, difficulty walking, decreased strength, impaired flexibility, obesity, and pain.   ACTIVITY LIMITATIONS: standing, squatting, stairs, transfers, and locomotion  level  PARTICIPATION LIMITATIONS: driving, shopping, community activity, occupation, and yard work  PERSONAL FACTORS: Age, Behavior pattern, Education, Fitness, Profession, Social background, and Time since onset of injury/illness/exacerbation are also affecting patient's functional outcome.   REHAB POTENTIAL: Fair chronicity of knee issues, poor health literacy   CLINICAL DECISION MAKING: Stable/uncomplicated  EVALUATION COMPLEXITY: Low   GOALS: Goals reviewed with patient? No  SHORT TERM GOALS: Target date: 05/25/2023   Will be compliant with appropriate HEP Baseline: Goal status: Met 05/17/23  2.  Knee edema to have resolved due to independent management strategies  Baseline:  Goal status: Met 05/17/23    LONG TERM GOALS: Target date: 06/15/2023    MMT to have improved by 1 grade all weak groups  Baseline:  Goal status: INITIAL  2.  Will be able to perform squat with good form to avoid aggravating knee  Baseline:  Goal status: INITIAL  3.  Will be able to exercise as desired with good form and correct use of machines/exercise progressions with no knee pain  Baseline:  Goal status: INITIAL  4.  Knee pain to be no more than 2/10 at worst  Baseline:  Goal status: INITIAL  5.  LEFS score to have improved by at least 10 points  Baseline:  Goal status: INITIAL     PLAN:  PT  FREQUENCY: 2x/week  PT DURATION: 6 weeks  PLANNED INTERVENTIONS: 97750- Physical Performance Testing, 97110-Therapeutic exercises, 97530- Therapeutic activity, [id number]- Neuromuscular re-education, 97535- Self Care, and 16109- Manual therapy  PLAN FOR NEXT SESSION: general strength/conditioning, work on biomechanics for exercise (body weight and machines), progress HEP PRN   Terrel Ferries, PT, DPT 05/19/23 10:58 AM

## 2023-05-24 ENCOUNTER — Ambulatory Visit: Admitting: Physical Therapy

## 2023-05-26 ENCOUNTER — Ambulatory Visit: Admitting: Physical Therapy

## 2023-06-03 ENCOUNTER — Ambulatory Visit: Admitting: Physical Therapy

## 2023-06-03 DIAGNOSIS — M6281 Muscle weakness (generalized): Secondary | ICD-10-CM

## 2023-06-03 DIAGNOSIS — M25562 Pain in left knee: Secondary | ICD-10-CM | POA: Diagnosis not present

## 2023-06-03 NOTE — Therapy (Signed)
 OUTPATIENT PHYSICAL THERAPY LOWER EXTREMITY TREATMENT    Patient Name: Dawn Brock MRN: [id number] DOB:Jul 22, 1985, 38 y.o., female Today's Date: 06/03/2023  END OF SESSION:  PT End of Session - 06/03/23 0934     Visit Number 4    Number of Visits 13    Date for PT Re-Evaluation 06/15/23    Authorization Type [employer]    Authorization Time Period 05/04/23 to 06/17/23    PT Start Time 0934    PT Stop Time 1015    PT Time Calculation (min) 41 min              Past Medical History:  Diagnosis Date   Abnormal Pap smear    Anemia    Gastritis    Hx of chlamydia infection    Vaginal Pap smear, abnormal    Past Surgical History:  Procedure Laterality Date   HERNIA REPAIR     As an infant   LEEP     Patient Active Problem List   Diagnosis Date Noted   Acute cough 05/07/2023   Dysuria 05/07/2023   Urinary frequency 05/07/2023   Lump in neck 05/07/2023   Class 1 obesity due to excess calories without serious comorbidity with body mass index (BMI) of 34.0 to 34.9 in adult 05/07/2023   Encounter for annual health examination 10/11/2022   Tobacco use 10/11/2022   Fertility testing 09/29/2022   Class 1 obesity due to excess calories without serious comorbidity with body mass index (BMI) of 31.0 to 31.9 in adult 09/29/2022   Screening for STDs (sexually transmitted diseases) 09/29/2022   Need for influenza vaccination 09/29/2022   Tobacco abuse 09/29/2022   H/O LEEP 08/21/2016   Headache 08/21/2016   Mild tetrahydrocannabinol (THC) abuse 08/21/2016   History of substance abuse (HCC) 08/21/2016   Cocaine abuse complicating pregnancy (HCC) 02/22/2013   Alcohol dependence (HCC) 03/12/2012   Cannabis dependence (HCC) 03/12/2012   Executive function deficit 03/11/2012    Class: Acute   Cigarette smoker 03/10/2012   Duodenitis with nausea, vomiting, diarrhea and abdominal pain 03/10/2012    PCP: Parry Bolster FNP   REFERRING PROVIDER: Saundra Curl,  MD  REFERRING DIAG:  Free Text Diagnosis  Lumbar, Lt Knee Pain    THERAPY DIAG:  Acute pain of left knee  Muscle weakness (generalized)  Rationale for Evaluation and Treatment: Rehabilitation  ONSET DATE: end of march 2025  SUBJECTIVE:   SUBJECTIVE STATEMENT: missed a few appt ,a lot going on. Have not been to gym either laterly   PERTINENT HISTORY: See above  PAIN:  Are you having pain? No 0/10  PRECAUTIONS: None  RED FLAGS: None   WEIGHT BEARING RESTRICTIONS: No  FALLS:  Has patient fallen in last 6 months? 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  DIAGNOSTIC FINDINGS:   Narrative & Impression  CLINICAL DATA:  Left knee injury, pain   EXAM: LEFT KNEE - COMPLETE 4+ VIEW   COMPARISON:  None Available.   FINDINGS: No evidence of fracture, dislocation, or joint effusion. No evidence of arthropathy or other focal bone abnormality. Soft tissues are unremarkable.  IMPRESSION: Negative.    PATIENT SURVEYS:  LEFS 32/80  COGNITION: Overall cognitive status: Within functional limits for tasks assessed     SENSATION: Not tested  EDEMA:   Reports edema L knee posteriorly and laterally       LOWER EXTREMITY ROM:  Active ROM Right eval Left eval Left 06/03/23  Hip flexion     Hip extension     Hip abduction     Hip adduction     Hip internal rotation     Hip external rotation     Knee flexion  118* WFLs  Knee extension  -8* supine bolster under heel, hyperextension WFLS  Ankle dorsiflexion     Ankle plantarflexion     Ankle inversion     Ankle eversion       (Blank rows = not tested)  LOWER EXTREMITY MMT:  MMT Right eval Left eval RT/Left 06/03/23  Hip flexion 5 4 knee pain  5/5  Hip extension 3+ 3 4/4  Hip abduction 4 3- 4/4  Hip adduction     Hip internal rotation     Hip external rotation     Knee flexion 5 4 pain  5/4+  Knee extension 5 4 pain  5/5  Ankle dorsiflexion     Ankle plantarflexion     Ankle inversion     Ankle eversion      (Blank rows = not tested)                                                                                                                                 TREATMENT DATE:   06/03/23 Nustep L 5 6 min LE only Assessed MMT /ROM and goals 6 inch step up fwd opp leg ext 6 inch lateral step up opp leg abd STS with wt ball chest press 10 x then 10 x with OH Knee ext 2 sets 10  25# HS curl 2x10 10# Leg press 2 x 10  60# Lat pull 2 x 10 30# Seated row 2 x 10 30#   05/19/23  Nustep L4 x8 minutes BLEs only  Staggered STS 2x5 B  Hip hikes 1x10 B Hip hikes + ABD 1x10 B Forward step ups 6 inch step x15 B  Lateral step ups 6 inch x15 B   Bridges + ABD into green TB x12  Sidelying hip ABD green TB x12 Walking bridges x7  HS stretches on steps (standing) 2x30 seconds B  Gastroc stretches 2x30 seconds B   Education on not overdoing at the gym, educated about Horticulturist, commercial (quads/hams/leg press) with limited reps, OK for TM and encouraged upper body strengthening as well but avoiding lower body resistance between PT sessions outside of HEP for now     05/14/23 Bike L 3 x 6 min 30lb resisted gait 4 way x 3 each Step ups forward & lateral 6in x 10 each  Slant board calf stretch HS curls  25lb 2x10  Leg Ext 5lb 2x10 Sit to stands 2x10  05/04/23  Eval (LIMITED), HEP, POC     PATIENT EDUCATION:  Education details: exam, POC, HEP  Person educated: Patient Education method: Explanation, Demonstration, and Handouts Education comprehension: verbalized understanding, returned  demonstration, and needs further education  HOME EXERCISE PROGRAM: Access Code: [email or web address] URL: [email or web address] Date: 05/04/2023 Prepared by: Terrel Ferries  Exercises - Supine Bridge with Resistance Band  - 1 x daily - 7 x weekly - 1-2 sets - 10 reps - Sidelying Hip Abduction with Resistance at Thighs  - 1 x daily - 7 x weekly - 1-2 sets - 10 reps - Supine Hamstring Stretch with Strap  - 1 x daily - 7 x weekly - 1 sets - 3 reps - 30 seconds  hold - Supine Active Straight Leg Raise  - 1 x daily - 7 x weekly - 1-2 sets - 10 reps  ASSESSMENT:  CLINICAL IMPRESSION:  Pt arrives doing OK, States no pain. Mentally a lot going in life so has missed a few appts and has not been to gym. Assessed and documented ROM/MMT/goals.Pt states therapy has really helped pain and swelling, not really either now. Progressed strengthening and educ on gym ex and wt progression safely for gym and she VU.    Patient is a 38 y.o. F who was seen today for physical therapy evaluation and treatment for knee and back pain. She arrived late and was very talkative so Pt evaluation was quite limited today unfortunately. See objectives above. I do not agree that she may have lumbar involvement, all sx appear related to her knee today. Will make every effort to meet her goals and assist her in return to exercise pain free.    OBJECTIVE IMPAIRMENTS: decreased mobility, difficulty walking, decreased strength, impaired flexibility, obesity, and pain.   ACTIVITY LIMITATIONS: standing, squatting, stairs, transfers, and locomotion level  PARTICIPATION LIMITATIONS: driving, shopping, community activity, occupation, and yard work  PERSONAL FACTORS: Age, Behavior pattern, Education, Fitness, Profession, Social background, and Time since onset of injury/illness/exacerbation are also affecting patient's functional outcome.   REHAB POTENTIAL: Fair chronicity of knee issues, poor health literacy   CLINICAL  DECISION MAKING: Stable/uncomplicated  EVALUATION COMPLEXITY: Low   GOALS: Goals reviewed with patient? No  SHORT TERM GOALS: Target date: 05/25/2023   Will be compliant with appropriate HEP Baseline: Goal status: Met 05/17/23  2.  Knee edema to have resolved due to independent management strategies  Baseline:  Goal status: Met 05/17/23    LONG TERM GOALS: Target date: 06/15/2023    MMT to have improved by 1 grade all weak groups  Baseline:  Goal status: MET 06/03/23  2.  Will be able to perform squat with good form to avoid aggravating knee  Baseline:  Goal status: MET 06/03/23  3.  Will be able to exercise as desired with good form and correct use of machines/exercise progressions with no knee pain  Baseline:  Goal status: INITIAL  4.  Knee pain to be no more than 2/10 at worst  Baseline:  Goal status: progressing 06/03/23  5.  LEFS score to have improved by at least 10 points  Baseline:  Goal status: 06/03/23 MET     PLAN:  PT FREQUENCY: 2x/week  PT DURATION: 6 weeks  PLANNED INTERVENTIONS: 97750- Physical Performance Testing, 97110-Therapeutic exercises, 97530- Therapeutic activity, 97112- Neuromuscular re-education, 97535- Self Care, and 54098- Manual therapy  PLAN FOR NEXT SESSION: encouraged pt to get back into gym  routine and schedule PT follow up if needed  Patient Details  Name: Dawn Brock MRN: [id number] Date of Birth: 1985-04-03 Referring Provider:  Saundra Curl, MD  Encounter Date: 06/03/2023   Aquilla Bayley, PTA 06/03/2023, 9:35 AM  [facility] [address] [place], [place], [address] Phone: [phone]   Fax:  [phone]

## 2023-09-30 ENCOUNTER — Ambulatory Visit (INDEPENDENT_AMBULATORY_CARE_PROVIDER_SITE_OTHER): Payer: Medicaid Other | Admitting: Nurse Practitioner

## 2023-09-30 ENCOUNTER — Encounter: Payer: Self-pay | Admitting: Nurse Practitioner

## 2023-09-30 VITALS — BP 120/80 | HR 98 | Temp 98.5°F | Ht 67.0 in | Wt 212.6 lb

## 2023-09-30 DIAGNOSIS — Z0000 Encounter for general adult medical examination without abnormal findings: Secondary | ICD-10-CM

## 2023-09-30 DIAGNOSIS — Z6833 Body mass index (BMI) 33.0-33.9, adult: Secondary | ICD-10-CM

## 2023-09-30 DIAGNOSIS — E66811 Obesity, class 1: Secondary | ICD-10-CM | POA: Insufficient documentation

## 2023-09-30 DIAGNOSIS — F17210 Nicotine dependence, cigarettes, uncomplicated: Secondary | ICD-10-CM | POA: Diagnosis not present

## 2023-09-30 DIAGNOSIS — Z23 Encounter for immunization: Secondary | ICD-10-CM | POA: Diagnosis not present

## 2023-09-30 DIAGNOSIS — Z114 Encounter for screening for human immunodeficiency virus [HIV]: Secondary | ICD-10-CM

## 2023-09-30 DIAGNOSIS — Z13228 Encounter for screening for other metabolic disorders: Secondary | ICD-10-CM

## 2023-09-30 DIAGNOSIS — Z139 Encounter for screening, unspecified: Secondary | ICD-10-CM

## 2023-09-30 DIAGNOSIS — E6609 Other obesity due to excess calories: Secondary | ICD-10-CM | POA: Diagnosis not present

## 2023-09-30 DIAGNOSIS — Z13220 Encounter for screening for lipoid disorders: Secondary | ICD-10-CM

## 2023-09-30 DIAGNOSIS — Z113 Encounter for screening for infections with a predominantly sexual mode of transmission: Secondary | ICD-10-CM

## 2023-09-30 DIAGNOSIS — Z2821 Immunization not carried out because of patient refusal: Secondary | ICD-10-CM

## 2023-09-30 DIAGNOSIS — Z136 Encounter for screening for cardiovascular disorders: Secondary | ICD-10-CM

## 2023-09-30 NOTE — Progress Notes (Signed)
 [name], CMA,acting as a Neurosurgeon for [name], FNP.,have documented all relevant documentation on the behalf of [name], FNP,as directed by  [name], FNP while in the presence of [name], FNP.  Subjective:    Patient ID: Dawn Brock , female    DOB: 04/15/1985 , 38 y.o.   MRN: [id number]  Chief Complaint  Patient presents with   Annual Exam    Patient presents today for HM, Patient reports compliance with medication. Patient denies any chest pain, SOB, or headaches. Patient has no concerns today. Swab [facility] STDs [facility].       HPI  Discussed the use of AI scribe software for clinical note transcription with the patient, who gave verbal consent to proceed.  History of Present Illness Dawn Brock is a 38 year old female who presents for follow-up regarding weight management and smoking cessation.  She has been actively working on weight loss since her last visit in April. She joined a gym and has been modifying her diet, resulting in weight loss from 220 pounds to an unspecified lower weight, with a goal of reaching 200 pounds. Her schedule, which includes attending school from 8:30 AM to 1:00 PM and working at [employer], affects her ability to maintain a consistent eating schedule. She tries not to eat after 8 PM and is reducing carbohydrate intake.  She continues to smoke cigarettes despite efforts to quit and has not tried medications like Wellbutrin. She received free nicotine  products. She quit smoking marijuana, stating she no longer has a desire for it.  Her mother is on hospice care due to cancer affecting her tailbone, which has been a significant stressor. She is the primary caregiver, with her siblings living out of state. Her mother was initially able to walk but now requires ambulance transport due to her condition.  She has been trying to maintain an active lifestyle, using home exercise equipment like stretch bands and leg  weights, and performing exercises to strengthen her knees. She wants to return to the gym to use the treadmill.  She mentions a previous A1c of 5.9 and is awaiting current lab results. She has not received a flu shot but is getting a tetanus shot.  She has not been to the dentist in a year and was advised to get a deep cleaning, which is not covered by her insurance. She is exploring options for dental insurance through her job.   Past Medical History:  Diagnosis Date   Abnormal Pap smear    Anemia    Gastritis    Hx of chlamydia infection    Vaginal Pap smear, abnormal      Family History  Problem Relation Age of Onset   Hypertension Mother    Fibromyalgia Mother    Cirrhosis Father    Kidney disease Father    Asthma Brother    Birth defects Son        hirschprungs     Current Outpatient Medications:    fluconazole  (DIFLUCAN ) 100 MG tablet, Take 1 tablet (100 mg total) by mouth daily. Take 1 tablet by mouth now repeat in 5 days, Disp: 2 tablet, Rfl: 0   metroNIDAZOLE  (FLAGYL ) 500 MG tablet, Take 1 tablet (500 mg total) by mouth 3 (three) times daily for 10 days., Disp: 30 tablet, Rfl: 0   Allergies  Allergen Reactions   Tramadol  Nausea And Vomiting      The patient states she uses none for birth  control. Patient's last menstrual period was 09/19/2023.. Negative for Dysmenorrhea and Negative for Menorrhagia. Negative for: breast discharge, breast lump(s), breast pain and breast self exam. Associated symptoms include abnormal vaginal bleeding. Pertinent negatives include abnormal bleeding (hematology), anxiety, decreased libido, depression, difficulty falling sleep, dyspareunia, history of infertility, nocturia, sexual dysfunction, sleep disturbances, urinary incontinence, urinary urgency, vaginal discharge and vaginal itching. Diet regular.The patient states her exercise level is    . The patient's tobacco use is:  Social History   Tobacco Use  Smoking Status Every Day    Current packs/day: 0.50   Average packs/day: 0.5 packs/day for 20.0 years (10.0 ttl pk-yrs)   Types: Cigarettes  Smokeless Tobacco Never  Tobacco Comments   She just bought a pack yesterday - 04/27/23  . She has been exposed to passive smoke. The patient's alcohol use is:  Social History   Substance and Sexual Activity  Alcohol Use Yes   Comment: Not since June 2014   Additional information: Last pap 09/18/2021, next one scheduled for 09/18/2024.    Review of Systems  Constitutional: Negative.   HENT: Negative.    Eyes: Negative.   Respiratory: Negative.    Cardiovascular: Negative.   Gastrointestinal: Negative.   Endocrine: Negative.   Genitourinary: Negative.   Musculoskeletal: Negative.   Skin: Negative.   Allergic/Immunologic: Negative.   Neurological: Negative.   Hematological: Negative.   Psychiatric/Behavioral: Negative.       Today's Vitals   09/30/23 1529  BP: 120/80  Pulse: 98  Temp: 98.5 F (36.9 C)  TempSrc: Oral  Weight: 212 lb 9.6 oz (96.4 kg)  Height: 5' 7 (1.702 m)  PainSc: 0-No pain   Body mass index is 33.3 kg/m.  Wt Readings from Last 3 Encounters:  09/30/23 212 lb 9.6 oz (96.4 kg)  04/27/23 221 lb (100.2 kg)  04/12/23 200 lb (90.7 kg)     Objective:  Physical Exam Vitals and nursing note reviewed. Exam conducted with a chaperone present.  Constitutional:      General: She is not in acute distress.    Appearance: Normal appearance. She is well-developed. She is obese.  HENT:     Head: Normocephalic and atraumatic.     Right Ear: Hearing, tympanic membrane, ear canal and external ear normal. There is no impacted cerumen.     Left Ear: Hearing, tympanic membrane, ear canal and external ear normal. There is no impacted cerumen.     Nose: Nose normal.     Mouth/Throat:     Mouth: Mucous membranes are moist.  Eyes:     General: Lids are normal.     Extraocular Movements: Extraocular movements intact.     Conjunctiva/sclera: Conjunctivae  normal.     Pupils: Pupils are equal, round, and reactive to light.     Funduscopic exam:    Right eye: No papilledema.        Left eye: No papilledema.  Neck:     Thyroid: No thyroid mass.     Vascular: No carotid bruit.  Cardiovascular:     Rate and Rhythm: Normal rate and regular rhythm.     Pulses: Normal pulses.     Heart sounds: Normal heart sounds. No murmur heard. Pulmonary:     Effort: Pulmonary effort is normal. No respiratory distress.     Breath sounds: Normal breath sounds. No wheezing.  Chest:     Chest wall: No mass.  Breasts:    Tanner Score is 5.     Right: Normal.  No mass or tenderness.     Left: Normal. No mass or tenderness.  Abdominal:     General: Abdomen is flat. Bowel sounds are normal. There is no distension.     Palpations: Abdomen is soft.     Tenderness: There is no abdominal tenderness.     Hernia: There is no hernia in the right inguinal area.  Genitourinary:    General: Normal vulva.     Exam position: Lithotomy position.     Tanner stage (genital): 5.     Labia:        Right: No rash or tenderness.        Left: No rash or tenderness.      Rectum: Normal. Guaiac result negative.  Musculoskeletal:        General: No swelling or tenderness. Normal range of motion.     Cervical back: Full passive range of motion without pain, normal range of motion and neck supple.     Right lower leg: No edema.     Left lower leg: No edema.  Lymphadenopathy:     Upper Body:     Right upper body: No supraclavicular, axillary or pectoral adenopathy.     Left upper body: No supraclavicular, axillary or pectoral adenopathy.  Skin:    General: Skin is warm and dry.     Capillary Refill: Capillary refill takes less than 2 seconds.  Neurological:     General: No focal deficit present.     Mental Status: She is alert and oriented to person, place, and time.     Cranial Nerves: No cranial nerve deficit.     Sensory: No sensory deficit.     Motor: No weakness.   Psychiatric:        Mood and Affect: Mood normal.        Behavior: Behavior normal.        Thought Content: Thought content normal.        Judgment: Judgment normal.     Assessment And Plan:     Encounter for annual health examination Assessment & Plan: Discussed lifestyle modifications and importance of regular physical activity. - Ordered labs: cholesterol, A1c, kidney and liver function tests, hemoglobin, hepatitis B immunity check. - Administered tetanus shot. - Encouraged continuation of healthy lifestyle changes. - Encouraged regular dental visits and exploration of dental insurance options.  Orders: -     CBC with Differential/Platelet -     CMP14+EGFR  Cigarette smoker Assessment & Plan: Ongoing nicotine  dependence. Considering smoking cessation options. - Discussed Wellbutrin as a potential aid for smoking cessation and weight loss. - Encouraged use of nicotine  replacement therapy if needed.   COVID-19 vaccination declined  Influenza vaccination declined  Need for Tdap vaccination Assessment & Plan: Tdap given in office  Orders: -     Tdap vaccine greater than or equal to 7yo IM  Class 1 obesity due to excess calories without serious comorbidity with body mass index (BMI) of 33.0 to 33.9 in adult Assessment & Plan: Actively pursuing weight loss through diet and exercise. - Encouraged continuation of diet and exercise regimen. - Discussed potential benefits of Wellbutrin for weight loss and smoking cessation.   Screening for STDs (sexually transmitted diseases) Assessment & Plan:  We will send std screening to [facility].   Orders: -     RPR  Encounter for HIV (human immunodeficiency virus) test -     HIV Antibody (routine testing w rflx)  Encounter for lipid screening for cardiovascular  disease -     Lipid panel  Encounter for screening for metabolic disorder -     Hemoglobin A1c  Encounter for screening -     Hepatitis B surface  antibody,qualitative   Return for 1 year physical, 69m pre DM. Patient was given opportunity to ask questions. Patient verbalized understanding of the plan and was able to repeat key elements of the plan. All questions were answered to their satisfaction.   [name], FNP  I, [name], FNP, have reviewed all documentation for this visit. The documentation on 09/30/23 for the exam, diagnosis, procedures, and orders are all accurate and complete.

## 2023-10-01 LAB — CBC WITH DIFFERENTIAL/PLATELET
Basophils Absolute: 0.1 x10E3/uL (ref 0.0–0.2)
Basos: 1 %
EOS (ABSOLUTE): 0.2 x10E3/uL (ref 0.0–0.4)
Eos: 2 %
Hematocrit: 39.8 % (ref 34.0–46.6)
Hemoglobin: 12.8 g/dL (ref 11.1–15.9)
Immature Grans (Abs): 0 x10E3/uL (ref 0.0–0.1)
Immature Granulocytes: 0 %
Lymphocytes Absolute: 4 x10E3/uL — ABNORMAL HIGH (ref 0.7–3.1)
Lymphs: 45 %
MCH: 31.8 pg (ref 26.6–33.0)
MCHC: 32.2 g/dL (ref 31.5–35.7)
MCV: 99 fL — ABNORMAL HIGH (ref 79–97)
Monocytes Absolute: 0.6 x10E3/uL (ref 0.1–0.9)
Monocytes: 7 %
Neutrophils Absolute: 4.1 x10E3/uL (ref 1.4–7.0)
Neutrophils: 45 %
Platelets: 267 x10E3/uL (ref 150–450)
RBC: 4.03 x10E6/uL (ref 3.77–5.28)
RDW: 12.3 % (ref 11.7–15.4)
WBC: 8.9 x10E3/uL (ref 3.4–10.8)

## 2023-10-01 LAB — LIPID PANEL
Chol/HDL Ratio: 2.4 ratio (ref 0.0–4.4)
Cholesterol, Total: 160 mg/dL (ref 100–199)
HDL: 68 mg/dL (ref >39)
LDL Chol Calc (NIH): 79 mg/dL (ref 0–99)
Triglycerides: 63 mg/dL (ref 0–149)
VLDL Cholesterol Cal: 13 mg/dL (ref 5–40)

## 2023-10-01 LAB — HIV ANTIBODY (ROUTINE TESTING W REFLEX): HIV Screen 4th Generation wRfx: NONREACTIVE

## 2023-10-01 LAB — CMP14+EGFR
ALT: 7 IU/L (ref 0–32)
AST: 17 IU/L (ref 0–40)
Albumin: 4.4 g/dL (ref 3.9–4.9)
Alkaline Phosphatase: 90 IU/L (ref 41–116)
BUN/Creatinine Ratio: 12 (ref 9–23)
BUN: 8 mg/dL (ref 6–20)
Bilirubin Total: 0.2 mg/dL (ref 0.0–1.2)
CO2: 22 mmol/L (ref 20–29)
Calcium: 9.2 mg/dL (ref 8.7–10.2)
Chloride: 103 mmol/L (ref 96–106)
Creatinine, Ser: 0.68 mg/dL (ref 0.57–1.00)
Globulin, Total: 2.8 g/dL (ref 1.5–4.5)
Glucose: 81 mg/dL (ref 70–99)
Potassium: 3.8 mmol/L (ref 3.5–5.2)
Sodium: 141 mmol/L (ref 134–144)
Total Protein: 7.2 g/dL (ref 6.0–8.5)
eGFR: 114 mL/min/1.73 (ref >59)

## 2023-10-01 LAB — SYPHILIS: RPR W/REFLEX TO RPR TITER AND TREPONEMAL ANTIBODIES, TRADITIONAL SCREENING AND DIAGNOSIS ALGORITHM: RPR Ser Ql: NONREACTIVE

## 2023-10-01 LAB — HEPATITIS B SURFACE ANTIBODY,QUALITATIVE

## 2023-10-01 LAB — HEMOGLOBIN A1C
Est. average glucose Bld gHb Est-mCnc: 123 mg/dL
Hgb A1c MFr Bld: 5.9 % — ABNORMAL HIGH (ref 4.8–5.6)

## 2023-10-07 ENCOUNTER — Ambulatory Visit: Payer: Self-pay | Admitting: Nurse Practitioner

## 2023-10-07 MED ORDER — METRONIDAZOLE 500 MG PO TABS
500.0000 mg | ORAL_TABLET | Freq: Three times a day (TID) | ORAL | 0 refills | Status: AC
Start: 2023-10-07 — End: 2023-10-17

## 2023-10-10 NOTE — Assessment & Plan Note (Signed)
 Ongoing nicotine  dependence. Considering smoking cessation options. - Discussed Wellbutrin as a potential aid for smoking cessation and weight loss. - Encouraged use of nicotine  replacement therapy if needed.

## 2023-10-10 NOTE — Assessment & Plan Note (Signed)
 Discussed lifestyle modifications and importance of regular physical activity. - Ordered labs: cholesterol, A1c, kidney and liver function tests, hemoglobin, hepatitis B immunity check. - Administered tetanus shot. - Encouraged continuation of healthy lifestyle changes. - Encouraged regular dental visits and exploration of dental insurance options.

## 2023-10-10 NOTE — Assessment & Plan Note (Signed)
Tdap given in office

## 2023-10-10 NOTE — Assessment & Plan Note (Signed)
 Actively pursuing weight loss through diet and exercise. - Encouraged continuation of diet and exercise regimen. - Discussed potential benefits of Wellbutrin for weight loss and smoking cessation.

## 2023-10-10 NOTE — Assessment & Plan Note (Signed)
  We will send std screening to [facility].

## 2023-10-24 ENCOUNTER — Other Ambulatory Visit: Payer: Self-pay

## 2023-10-24 ENCOUNTER — Emergency Department (HOSPITAL_COMMUNITY)
Admission: EM | Admit: 2023-10-24 | Discharge: 2023-10-25 | Disposition: A | Discharge status: Home / Self Care | Attending: Emergency Medicine | Admitting: Emergency Medicine

## 2023-10-24 ENCOUNTER — Encounter (HOSPITAL_COMMUNITY): Payer: Self-pay

## 2023-10-24 DIAGNOSIS — I889 Nonspecific lymphadenitis, unspecified: Secondary | ICD-10-CM

## 2023-10-24 DIAGNOSIS — R2232 Localized swelling, mass and lump, left upper limb: Secondary | ICD-10-CM | POA: Diagnosis present

## 2023-10-24 DIAGNOSIS — L042 Acute lymphadenitis of upper limb: Secondary | ICD-10-CM | POA: Insufficient documentation

## 2023-10-24 NOTE — ED Triage Notes (Signed)
 Pt arrived from home via POV c/o cyst on left axilla. Pt states that she has hx of same.

## 2023-10-25 MED ORDER — NAPROXEN 500 MG PO TABS
500.0000 mg | ORAL_TABLET | ORAL | Status: AC
  Administered 2023-10-25: 500 mg via ORAL
  Filled 2023-10-25: qty 1

## 2023-10-25 MED ORDER — CEPHALEXIN 500 MG PO CAPS: 500.0000 mg | ORAL_CAPSULE | Freq: Four times a day (QID) | ORAL | 0 refills | Status: DC

## 2023-10-25 MED ORDER — CEPHALEXIN 500 MG PO CAPS
500.0000 mg | ORAL_CAPSULE | Freq: Once | ORAL | Status: AC
  Administered 2023-10-25: 500 mg via ORAL
  Filled 2023-10-25: qty 1

## 2023-10-25 MED ORDER — NAPROXEN 500 MG PO TABS: 500.0000 mg | ORAL_TABLET | Freq: Two times a day (BID) | ORAL | 0 refills | Status: DC

## 2023-10-25 MED ORDER — DOXYCYCLINE HYCLATE 100 MG PO CAPS: 100.0000 mg | ORAL_CAPSULE | Freq: Two times a day (BID) | ORAL | 0 refills | Status: DC

## 2023-10-25 MED ORDER — DOXYCYCLINE HYCLATE 100 MG PO TABS
100.0000 mg | ORAL_TABLET | Freq: Once | ORAL | Status: AC
Start: 2023-10-25 — End: 2023-10-25
  Administered 2023-10-25: 100 mg via ORAL
  Filled 2023-10-25: qty 1

## 2023-10-25 NOTE — ED Notes (Signed)
 Patient d/c with home care instructions.

## 2023-10-25 NOTE — ED Notes (Addendum)
 Dawn Brock

## 2023-10-25 NOTE — ED Provider Notes (Signed)
 [place] EMERGENCY DEPARTMENT AT [facility] Provider Note   CSN: [id number] Arrival date & time: 10/24/23  2335     Patient presents with: Cyst   Dawn Brock is a 38 y.o. female.   The history is provided by the patient.  Illness Location:  Left axilla Quality:  Pain swelling Severity:  Moderate Onset quality:  Gradual Duration:  2 days Timing:  Constant Progression:  Worsening Worsened by:  Nothing Ineffective treatments:  Warm compresses Associated symptoms: no fever        Prior to Admission medications   Medication Sig Start Date End Date Taking? Authorizing Provider  cephALEXin  (KEFLEX ) 500 MG capsule Take 1 capsule (500 mg total) by mouth 4 (four) times daily. 10/25/23  Yes Neila Teem, MD  doxycycline  (VIBRAMYCIN ) 100 MG capsule Take 1 capsule (100 mg total) by mouth 2 (two) times daily. 10/25/23  Yes Xzavior Reinig, MD  naproxen  (NAPROSYN ) 500 MG tablet Take 1 tablet (500 mg total) by mouth 2 (two) times daily with a meal. 10/25/23  Yes Shatonya Passon, MD  fluconazole  (DIFLUCAN ) 100 MG tablet Take 1 tablet (100 mg total) by mouth daily. Take 1 tablet by mouth now repeat in 5 days 04/27/23   Georgina Speaks, FNP    Allergies: Tramadol     Review of Systems  Constitutional:  Negative for fever.  Musculoskeletal:  Negative for neck pain and neck stiffness.  All other systems reviewed and are negative.   Updated Vital Signs BP (!) 131/95 (BP Location: Left Arm)   Pulse 81   Temp 97.6 F (36.4 C) (Oral)   Resp 18   Ht 5' 7 (1.702 m)   Wt 96.2 kg   LMP 10/13/2023 (Exact Date)   SpO2 100%   BMI 33.20 kg/m   Physical Exam Vitals and nursing note reviewed.  Constitutional:      General: She is not in acute distress.    Appearance: Normal appearance. She is well-developed.  HENT:     Head: Normocephalic and atraumatic.     Nose: Nose normal.  Eyes:     Pupils: Pupils are equal, round, and reactive to light.  Cardiovascular:      Rate and Rhythm: Normal rate and regular rhythm.     Pulses: Normal pulses.     Heart sounds: Normal heart sounds.  Pulmonary:     Effort: Pulmonary effort is normal. No respiratory distress.     Breath sounds: Normal breath sounds.  Abdominal:     General: Bowel sounds are normal. There is no distension.     Palpations: Abdomen is soft.     Tenderness: There is no abdominal tenderness. There is no guarding or rebound.  Musculoskeletal:        General: Normal range of motion.     Cervical back: Normal range of motion and neck supple.  Skin:    General: Skin is warm and dry.     Capillary Refill: Capillary refill takes less than 2 seconds.     Findings: No erythema or rash.  Neurological:     General: No focal deficit present.     Mental Status: She is alert and oriented to person, place, and time.     Deep Tendon Reflexes: Reflexes normal.  Psychiatric:        Mood and Affect: Mood normal.     (all labs ordered are listed, but only abnormal results are displayed) Labs Reviewed - No data to display  EKG: None  Radiology:  No results found.   Procedures   Medications Ordered in the ED  doxycycline  (VIBRA -TABS) tablet 100 mg (has no administration in time range)  cephALEXin  (KEFLEX ) capsule 500 mg (has no administration in time range)  naproxen  (NAPROSYN ) tablet 500 mg (has no administration in time range)                                    Medical Decision Making Patient with left axillary pain   Amount and/or Complexity of Data Reviewed External Data Reviewed: notes.    Details: Previous notes reviewed   Risk Prescription drug management. Risk Details: On US  no abscess, there is a slightly enlarged lymph nose < 1 cm, not matted freely mobile.  I favor reactive lymphadenitis and will treat with antibiotics and anti inflammatories.       Final diagnoses:  Lymphadenitis   No signs of systemic illness or infection. The patient is nontoxic-appearing on exam  and vital signs are within normal limits.  I have reviewed the triage vital signs and the nursing notes. Pertinent labs & imaging results that were available during my care of the patient were reviewed by me and considered in my medical decision making (see chart for details). After history, exam, and medical workup I feel the patient has been appropriately medically screened and is safe for discharge home. Pertinent diagnoses were discussed with the patient. Patient was given return precautions.  ED Discharge Orders          Ordered    naproxen  (NAPROSYN ) 500 MG tablet  2 times daily with meals        10/25/23 0024    cephALEXin  (KEFLEX ) 500 MG capsule  4 times daily        10/25/23 0024    doxycycline  (VIBRAMYCIN ) 100 MG capsule  2 times daily        10/25/23 0024               Brittni Hult, MD 10/25/23 9955

## 2023-10-27 ENCOUNTER — Encounter (HOSPITAL_COMMUNITY): Payer: Self-pay | Admitting: *Deleted

## 2023-10-27 ENCOUNTER — Emergency Department (HOSPITAL_COMMUNITY)
Admission: EM | Admit: 2023-10-27 | Discharge: 2023-10-27 | Disposition: A | Discharge status: Home / Self Care | Attending: Emergency Medicine | Admitting: Emergency Medicine

## 2023-10-27 ENCOUNTER — Other Ambulatory Visit: Payer: Self-pay

## 2023-10-27 DIAGNOSIS — L02412 Cutaneous abscess of left axilla: Secondary | ICD-10-CM | POA: Insufficient documentation

## 2023-10-27 MED ORDER — LIDOCAINE-EPINEPHRINE (PF) 2 %-1:200000 IJ SOLN
10.0000 mL | Freq: Once | INTRAMUSCULAR | Status: AC
  Administered 2023-10-27: 10 mL
  Filled 2023-10-27: qty 20

## 2023-10-27 NOTE — ED Provider Notes (Signed)
 [place] EMERGENCY DEPARTMENT AT [facility] Provider Note   CSN: [id number] Arrival date & time: 10/27/23  9086     Patient presents with: Abscess   Dawn Brock is a 38 y.o. female.   Abscess Patient is a 38 year old female presenting ED today presents for abscess to left axilla, noting that she had previously been seen 3 days ago and sent home on antibiotics concerned that this may be lymphadenitis.  However notes that it has come to a pustule head and is wishing to have it drained.  Previous medical history of polysubstance use, abscesses, anemia  Denies headache, fever, vision changes, chest pain, shortness of breath, abdominal pain, nausea, vomiting, body aches, chills.     Prior to Admission medications   Medication Sig Start Date End Date Taking? Authorizing Provider  cephALEXin  (KEFLEX ) 500 MG capsule Take 1 capsule (500 mg total) by mouth 4 (four) times daily. 10/25/23   Palumbo, April, MD  doxycycline  (VIBRAMYCIN ) 100 MG capsule Take 1 capsule (100 mg total) by mouth 2 (two) times daily. 10/25/23   Palumbo, April, MD  fluconazole  (DIFLUCAN ) 100 MG tablet Take 1 tablet (100 mg total) by mouth daily. Take 1 tablet by mouth now repeat in 5 days 04/27/23   Georgina Speaks, FNP  naproxen  (NAPROSYN ) 500 MG tablet Take 1 tablet (500 mg total) by mouth 2 (two) times daily with a meal. 10/25/23   Palumbo, April, MD    Allergies: Tramadol     Review of Systems  Skin:        Abscess  All other systems reviewed and are negative.   Updated Vital Signs BP 128/81   Pulse 71   Temp 98.5 F (36.9 C) (Oral)   Resp 16   Ht 5' 7 (1.702 m)   Wt 96.2 kg   LMP 10/13/2023 (Exact Date)   SpO2 100%   BMI 33.20 kg/m   Physical Exam Vitals and nursing note reviewed.  Constitutional:      General: She is not in acute distress.    Appearance: Normal appearance. She is not ill-appearing or diaphoretic.  HENT:     Head: Normocephalic and atraumatic.  Eyes:      General: No scleral icterus.       Right eye: No discharge.        Left eye: No discharge.     Extraocular Movements: Extraocular movements intact.     Conjunctiva/sclera: Conjunctivae normal.  Cardiovascular:     Rate and Rhythm: Normal rate and regular rhythm.     Pulses: Normal pulses.     Heart sounds: Normal heart sounds. No murmur heard.    No friction rub. No gallop.  Pulmonary:     Effort: Pulmonary effort is normal. No respiratory distress.     Breath sounds: No stridor. No wheezing, rhonchi or rales.  Chest:     Chest wall: No tenderness.  Abdominal:     General: Abdomen is flat. There is no distension.     Palpations: Abdomen is soft.     Tenderness: There is no abdominal tenderness. There is no right CVA tenderness, left CVA tenderness, guarding or rebound.  Musculoskeletal:        General: No swelling, deformity or signs of injury.     Cervical back: Normal range of motion. No rigidity.     Right lower leg: No edema.     Left lower leg: No edema.  Skin:    General: Skin is warm and dry.  Findings: Lesion (They do have approximately 2 x 3 cm abscess to left axilla.) present.  Neurological:     General: No focal deficit present.     Mental Status: She is alert and oriented to person, place, and time. Mental status is at baseline.     Sensory: No sensory deficit.     Motor: No weakness.  Psychiatric:        Mood and Affect: Mood normal.     (all labs ordered are listed, but only abnormal results are displayed) Labs Reviewed - No data to display  EKG: None  Radiology: No results found.  .Incision and Drainage  Date/Time: 10/27/2023 2:32 PM  Performed by: Beola Terrall RAMAN, PA-C Authorized by: Beola Terrall RAMAN, PA-C   Consent:    Consent obtained:  Verbal   Consent given by:  Patient   Risks, benefits, and alternatives were discussed: yes     Risks discussed:  Bleeding, damage to other organs, incomplete drainage, pain and infection   Alternatives  discussed:  No treatment and delayed treatment Universal protocol:    Procedure explained and questions answered to patient or proxy's satisfaction: yes     Relevant documents present and verified: yes     Site/side marked: yes     Immediately prior to procedure, a time out was called: yes     Patient identity confirmed:  Verbally with patient and arm band Location:    Type:  Abscess   Size:  2x3   Location:  Upper extremity   Upper extremity location:  Shoulder   Shoulder location:  L shoulder Pre-procedure details:    Skin preparation:  Antiseptic wash, chlorhexidine and povidone-iodine Sedation:    Sedation type:  None Anesthesia:    Anesthesia method:  Local infiltration   Local anesthetic:  Lidocaine  2% WITH epi Procedure type:    Complexity:  Simple Procedure details:    Ultrasound guidance: no     Needle aspiration: yes     Needle size:  18 G   Incision types:  Single straight   Wound management:  Probed and deloculated and irrigated with saline   Drainage:  Purulent and bloody   Drainage amount:  Copious   Wound treatment:  Wound left open   Packing materials:  None Post-procedure details:    Procedure completion:  Tolerated well, no immediate complications    Medications Ordered in the ED  lidocaine -EPINEPHrine  (XYLOCAINE  W/EPI) 2 %-1:200000 (PF) injection 10 mL (10 mLs Infiltration Given 10/27/23 1425)     Medical Decision Making   This patient is a 38 year old female who presents to the ED for concern of abscess to left axilla, noted to have been seen in the emergency department 3 days ago.  Prescribed antibiotics anti-inflammatories.  Coming into the emergency department today for persistent pain.  On physical exam, patient is in no acute distress, afebrile, alert and orient x 4, speaking in full sentences, nontachypneic, nontachycardic.  Notably does have area of fluctuance noted to left axilla noting to be approximately 2 x 3 cm.  Painful to the touch.   Axillary lymph nodes also palpated.  Suspect likely secondary to the abscess.  Abscess has already came to ahead and starting to drain.  Patient is requesting further drainage to be done at this time with breaking up of loculations.  Patient was cleaned profusely into the axilla as well as I&D with loculations broken up.  Patient had relief of symptoms.  Will have her continue taking antibiotics that  she is already been taking and finish the full course.  As well as return to PCP for any persistent symptoms.  Patient vital signs have remained stable throughout the course of patient's time in the ED. Low suspicion for any other emergent pathology at this time. I believe this patient is safe to be discharged. Provided strict return to ER precautions. Patient expressed agreement and understanding of plan. All questions were answered.   Differential diagnoses prior to evaluation: The emergent differential diagnosis includes, but is not limited to, abscess, cyst, cellulitis, necrotizing fasciitis, lymphadenitis. This is not an exhaustive differential.   Past Medical History / Co-morbidities / Social History: Alcohol dependence, headache, THC abuse, gastritis, anemia  Additional history: Chart reviewed. Pertinent results include:    Patient was last seen in the emergency department on 10/24/2023 noted to have left axillary cyst present for the last 2 days noted to have no abscess on ultrasound favored to be enlarged lymph node.  Sent home with naproxen , Keflex , doxycycline .   Medications:  I have reviewed the patients home medicines and have made adjustments as needed.  Critical Interventions: None  Social Determinants of Health: None  Disposition: After consideration of the diagnostic results and the patients response to treatment, I feel that the patient would benefit from discharge and treatment as above.   emergency department workup does not suggest an emergent condition requiring  admission or immediate intervention beyond what has been performed at this time. The plan is: Follow-up with PCP/dermatology, continue taking antibiotics, return for new or worsening symptoms. The patient is safe for discharge and has been instructed to return immediately for worsening symptoms, change in symptoms or any other concerns.  Final diagnoses:  Abscess of left axilla    ED Discharge Orders     None          Adalid Beckmann S, PA-C 10/27/23 1435    Patsey Lot, MD 10/28/23 [id number]

## 2023-10-27 NOTE — ED Triage Notes (Signed)
 Pt c/o abscess to L ac that was tx at [facility] on Monday.  Pt has been taken her antibiotics with no improvement.

## 2023-10-27 NOTE — Discharge Instructions (Addendum)
 You are seen today for abscess to left axilla.  Area was drained today with copious amount of fluid removed.  Recommend continue using heating pads over area as well as taking antibiotics, finishing full course of both antibiotics that you have been prescribed as this will help with preventing recurrence.  Recommend continued follow-up with your PCP for further evaluation as well as possible referral to dermatology if this continues to be an issue for you.  Return to the ER for any new or worsening symptoms

## 2023-11-28 ENCOUNTER — Emergency Department (HOSPITAL_COMMUNITY)
Admission: EM | Admit: 2023-11-28 | Discharge: 2023-11-28 | Disposition: A | Discharge status: Home / Self Care | Attending: Emergency Medicine | Admitting: Emergency Medicine

## 2023-11-28 ENCOUNTER — Telehealth: Admitting: Physician Assistant

## 2023-11-28 ENCOUNTER — Other Ambulatory Visit: Payer: Self-pay

## 2023-11-28 DIAGNOSIS — N730 Acute parametritis and pelvic cellulitis: Secondary | ICD-10-CM | POA: Diagnosis not present

## 2023-11-28 DIAGNOSIS — B9689 Other specified bacterial agents as the cause of diseases classified elsewhere: Secondary | ICD-10-CM | POA: Insufficient documentation

## 2023-11-28 DIAGNOSIS — N898 Other specified noninflammatory disorders of vagina: Secondary | ICD-10-CM

## 2023-11-28 DIAGNOSIS — N760 Acute vaginitis: Secondary | ICD-10-CM | POA: Insufficient documentation

## 2023-11-28 LAB — CBC WITH DIFFERENTIAL/PLATELET
Abs Immature Granulocytes: 0.01 K/uL (ref 0.00–0.07)
Basophils Absolute: 0.1 K/uL (ref 0.0–0.1)
Basophils Relative: 1 %
Eosinophils Absolute: 0.2 K/uL (ref 0.0–0.5)
Eosinophils Relative: 3 %
HCT: 39.9 % (ref 36.0–46.0)
Hemoglobin: 13.1 g/dL (ref 12.0–15.0)
Immature Granulocytes: 0 %
Lymphocytes Relative: 51 %
Lymphs Abs: 3.9 K/uL (ref 0.7–4.0)
MCH: 32 pg (ref 26.0–34.0)
MCHC: 32.8 g/dL (ref 30.0–36.0)
MCV: 97.6 fL (ref 80.0–100.0)
Monocytes Absolute: 0.5 K/uL (ref 0.1–1.0)
Monocytes Relative: 6 %
Neutro Abs: 3 K/uL (ref 1.7–7.7)
Neutrophils Relative %: 39 %
Platelets: 269 K/uL (ref 150–400)
RBC: 4.09 MIL/uL (ref 3.87–5.11)
RDW: 13 % (ref 11.5–15.5)
WBC: 7.6 K/uL (ref 4.0–10.5)
nRBC: 0 % (ref 0.0–0.2)

## 2023-11-28 LAB — URINALYSIS, ROUTINE W REFLEX MICROSCOPIC
Bilirubin Urine: NEGATIVE
Glucose, UA: NEGATIVE mg/dL
Hgb urine dipstick: NEGATIVE
Ketones, ur: NEGATIVE mg/dL
Nitrite: NEGATIVE
Protein, ur: NEGATIVE mg/dL
Specific Gravity, Urine: 1.02 (ref 1.005–1.030)
pH: 6 (ref 5.0–8.0)

## 2023-11-28 LAB — WET PREP, GENITAL
Sperm: NONE SEEN
Trich, Wet Prep: NONE SEEN
WBC, Wet Prep HPF POC: <10 (ref <10)
Yeast Wet Prep HPF POC: NONE SEEN

## 2023-11-28 LAB — HCG, SERUM, QUALITATIVE: Preg, Serum: NEGATIVE

## 2023-11-28 MED ORDER — STERILE WATER FOR INJECTION IJ SOLN
INTRAMUSCULAR | Status: AC
  Administered 2023-11-28: 2.1 mL
  Filled 2023-11-28: qty 10

## 2023-11-28 MED ORDER — METRONIDAZOLE 500 MG PO TABS: 500.0000 mg | ORAL_TABLET | Freq: Two times a day (BID) | ORAL | 0 refills | Status: DC

## 2023-11-28 MED ORDER — CEFTRIAXONE SODIUM 1 G IJ SOLR
500.0000 mg | Freq: Once | INTRAMUSCULAR | Status: AC
  Administered 2023-11-28: 500 mg via INTRAMUSCULAR
  Filled 2023-11-28: qty 10

## 2023-11-28 MED ORDER — DOXYCYCLINE HYCLATE 100 MG PO CAPS: 100.0000 mg | ORAL_CAPSULE | Freq: Two times a day (BID) | ORAL | 0 refills | Status: DC

## 2023-11-28 NOTE — ED Provider Notes (Signed)
 [place] EMERGENCY DEPARTMENT AT [facility] Provider Note   CSN: [id number] Arrival date & time: 11/28/23  1445     Patient presents with: Vaginal Itching   Dawn Brock is a 38 y.o. female.   The history is provided by the patient and medical records. No language interpreter was used.  Vaginal Itching     38 year old female with history of polysubstance use presenting requesting to be screened for potential STI.  Patient states she recently had a new sexual partner several days ago.  Afterwards she noted some vaginal irritation and slight itching.  She does not endorse any vaginal discharge strong odor denies any abdominal pain no fever no other symptom.  She denies any change in her feminine product soap or detergent.  She has remote history of STI including trichomonas.  States she is sexually active with a female and not using protection.  States he does not have any symptom.  She denies any specific treatment tried.  Prior to Admission medications   Medication Sig Start Date End Date Taking? Authorizing Provider  cephALEXin  (KEFLEX ) 500 MG capsule Take 1 capsule (500 mg total) by mouth 4 (four) times daily. 10/25/23   Palumbo, April, MD  doxycycline  (VIBRAMYCIN ) 100 MG capsule Take 1 capsule (100 mg total) by mouth 2 (two) times daily. 10/25/23   Palumbo, April, MD  fluconazole  (DIFLUCAN ) 100 MG tablet Take 1 tablet (100 mg total) by mouth daily. Take 1 tablet by mouth now repeat in 5 days 04/27/23   Georgina Speaks, FNP  naproxen  (NAPROSYN ) 500 MG tablet Take 1 tablet (500 mg total) by mouth 2 (two) times daily with a meal. 10/25/23   Palumbo, April, MD    Allergies: Tramadol     Review of Systems  All other systems reviewed and are negative.   Updated Vital Signs BP (!) 143/76   Pulse 92   Temp 98.1 F (36.7 C) (Oral)   Resp 16   Ht 5' 7 (1.702 m)   Wt 94.8 kg   LMP 11/13/2023   SpO2 99%   BMI 32.73 kg/m   Physical Exam Vitals and nursing note  reviewed.  Constitutional:      General: She is not in acute distress.    Appearance: She is well-developed.  HENT:     Head: Atraumatic.  Eyes:     Conjunctiva/sclera: Conjunctivae normal.  Pulmonary:     Effort: Pulmonary effort is normal.  Abdominal:     Palpations: Abdomen is soft.     Tenderness: There is no abdominal tenderness.  Musculoskeletal:     Cervical back: Neck supple.  Skin:    Findings: No rash.  Neurological:     Mental Status: She is alert.  Psychiatric:        Mood and Affect: Mood normal.     (all labs ordered are listed, but only abnormal results are displayed) Labs Reviewed  WET PREP, GENITAL - Abnormal; Notable for the following components:      Result Value   Clue Cells Wet Prep HPF POC PRESENT (*)    All other components within normal limits  URINALYSIS, ROUTINE W REFLEX MICROSCOPIC - Abnormal; Notable for the following components:   APPearance HAZY (*)    Leukocytes,Ua TRACE (*)    Bacteria, UA FEW (*)    All other components within normal limits  CBC WITH DIFFERENTIAL/PLATELET  HCG, SERUM, QUALITATIVE  RPR  MISC [facility] TEST (SEND OUT)  GC/CHLAMYDIA PROBE AMP ([place]) NOT AT  [facility]    EKG: None  Radiology: No results found.   .Pelvic exam  Date/Time: 11/28/2023 4:58 PM  Performed by: Nivia Colon, PA-C Authorized by: Nivia Colon, PA-C  Comments: Chaperone present during exam.  No inguinal lymphadenopathy or inguinal hernia noted.  Normal external genitalia.  Some mild skin irritation noted to the introitus but no abnormal lesions noted.  Patient does exhibit discomfort with speculum insertion.  Moderate amount of vaginal discharge noted in vaginal vault without any vaginal bleeding.  Close cervical os.  Bimanual exam is limited as patient endorse quite a bit of pain and discomfort with the exam.      Medications Ordered in the ED  cefTRIAXone  (ROCEPHIN ) injection 500 mg (500 mg Intramuscular Given 11/28/23 1740)  sterile water  (preservative free) injection (2.1 mLs  Given 11/28/23 1751)                                    Medical Decision Making Amount and/or Complexity of Data Reviewed Labs: ordered.  Risk Prescription drug management.   BP (!) 143/76   Pulse 92   Temp 98.1 F (36.7 C) (Oral)   Resp 16   Ht 5' 7 (1.702 m)   Wt 94.8 kg   LMP 11/13/2023   SpO2 99%   BMI 32.73 kg/m   43:46 PM  38 year old female with history of polysubstance use presenting requesting to be screened for potential STI.  Patient states she recently had a new sexual partner several days ago.  Afterwards she noted some vaginal irritation and slight itching.  She does not endorse any vaginal discharge strong odor denies any abdominal pain no fever no other symptom.  She denies any change in her feminine product soap or detergent.  She has remote history of STI including trichomonas.  States she is sexually active with a female and not using protection.  States she does not have any symptom.  She denies any specific treatment tried.  On exam patient is well-appearing appears to be in no acute discomfort.  She has a soft and nontender abdomen.  Pelvic exam notable for vaginal discharge with pain with bimanual exam.  Patient given antibiotic to cover for potential PID.  -Labs ordered, independently viewed and interpreted by me.  Labs remarkable for wet prep with evidence of clue cells.  Due to vaginal discomfort, patient will benefit from treatment with Flagyl .  Her pregnancy test is negative. -The patient was maintained on a cardiac monitor.  I personally viewed and interpreted the cardiac monitored which showed an underlying rhythm of: Normal sinus rhythm -Imaging including transvaginal ultrasound considered but not performed and is likely low yield -This patient presents to the ED for concern of vaginal discomfort, this involves an extensive number of treatment options, and is a complaint that carries with it a high risk of  complications and morbidity.  The differential diagnosis includes vaginal candidiasis, bacterial vaginosis, PID,, TOA, ectopic pregnancy -Co morbidities that complicate the patient evaluation includes polysubstance use -Treatment includes Rocephin  -Reevaluation of the patient after these medicines showed that the patient improved -PCP office notes or outside notes reviewed -Escalation to admission/observation considered: patients feels much better, is comfortable with discharge, and will follow up with PCP -Prescription medication considered, patient comfortable with doxycycline , Flagyl  -Social Determinant of Health considered which includes tobacco use      Final diagnoses:  BV (bacterial vaginosis)  PID (acute pelvic inflammatory  disease)    ED Discharge Orders          Ordered    doxycycline  (VIBRAMYCIN ) 100 MG capsule  2 times daily        11/28/23 1815    metroNIDAZOLE  (FLAGYL ) 500 MG tablet  2 times daily        11/28/23 1815               Nivia Colon, PA-C 11/28/23 1819    Pamella Ozell LABOR, DO 11/29/23 2324

## 2023-11-28 NOTE — ED Notes (Signed)
..  The patient is A&OX4, ambulatory at d/c with independent steady gait, NAD. Pt verbalized understanding of d/c instructions, prescriptions and follow up care.

## 2023-11-28 NOTE — Progress Notes (Signed)
  Because of your vaginal discharge, I feel your condition warrants further evaluation and I recommend that you be seen in a face-to-face visit.   NOTE: There will be NO CHARGE for this E-Visit   If you are having a true medical emergency, please call 911.     For an urgent face to face visit, [facility] has multiple urgent care centers for your convenience.  Click the link below for the full list of locations and hours, walk-in wait times, appointment scheduling options and driving directions:  Urgent Care - [place], [place], [place], [place], [place], [place]  [place]     Your MyChart E-visit questionnaire answers were reviewed by a board certified advanced clinical practitioner to complete your personal care plan based on your specific symptoms.    Thank you for using e-Visits.

## 2023-11-28 NOTE — ED Triage Notes (Signed)
 Patient to ED by POV for vaginal discomfort and itching. She reports area is sensitive to touch, swollen and itchy. Symptoms started Friday.

## 2023-11-28 NOTE — Discharge Instructions (Addendum)
 You have been evaluated for your symptoms.  Due to your vaginal discomfort, please take antibiotic to cover for potential pelvic inflammatory disease.  You also have evidence of bacterial vaginosis, check flagyl  as well.  Take medication with food so it does not upset your stomach.

## 2023-11-29 LAB — GC/CHLAMYDIA PROBE AMP (~~LOC~~) NOT AT ARMC
Chlamydia: NEGATIVE
Comment: NEGATIVE
Comment: NORMAL
Neisseria Gonorrhea: NEGATIVE

## 2023-11-29 LAB — RPR: RPR Ser Ql: NONREACTIVE

## 2023-11-30 LAB — MISC LABCORP TEST (SEND OUT): Labcorp test code: 83935

## 2023-12-01 ENCOUNTER — Ambulatory Visit: Admitting: Nurse Practitioner

## 2023-12-01 ENCOUNTER — Encounter: Payer: Self-pay | Admitting: Nurse Practitioner

## 2023-12-01 VITALS — BP 120/80 | HR 86 | Temp 98.5°F | Ht 67.0 in | Wt 212.4 lb

## 2023-12-01 DIAGNOSIS — Z6833 Body mass index (BMI) 33.0-33.9, adult: Secondary | ICD-10-CM

## 2023-12-01 DIAGNOSIS — E66811 Obesity, class 1: Secondary | ICD-10-CM

## 2023-12-01 DIAGNOSIS — R7303 Prediabetes: Secondary | ICD-10-CM

## 2023-12-01 DIAGNOSIS — N898 Other specified noninflammatory disorders of vagina: Secondary | ICD-10-CM

## 2023-12-01 DIAGNOSIS — E6609 Other obesity due to excess calories: Secondary | ICD-10-CM

## 2023-12-01 LAB — HEMOGLOBIN A1C
Est. average glucose Bld gHb Est-mCnc: 123 mg/dL
Hgb A1c MFr Bld: 5.9 % — ABNORMAL HIGH (ref 4.8–5.6)

## 2023-12-01 MED ORDER — CLOBETASOL PROPIONATE 0.05 % EX CREA
1.0000 | TOPICAL_CREAM | Freq: Two times a day (BID) | CUTANEOUS | 0 refills | Status: AC
Start: 2023-12-01 — End: ?

## 2023-12-01 MED ORDER — FLUCONAZOLE 100 MG PO TABS: 100.0000 mg | ORAL_TABLET | Freq: Every day | ORAL | 0 refills | Status: DC

## 2023-12-01 NOTE — Progress Notes (Deleted)
 [name], CMA,acting as a neurosurgeon for [name], FNP.,have documented all relevant documentation on the behalf of [name], FNP,as directed by  [name], FNP while in the presence of [name], FNP.  Subjective:  Patient ID: Dawn Brock , female    DOB: 1985/09/24 , 38 y.o.   MRN: [id number]  No chief complaint on file.   HPI  HPI   Past Medical History:  Diagnosis Date   Abnormal Pap smear    Anemia    Gastritis    Hx of chlamydia infection    Vaginal Pap smear, abnormal      Family History  Problem Relation Age of Onset   Hypertension Mother    Fibromyalgia Mother    Cirrhosis Father    Kidney disease Father    Asthma Brother    Birth defects Son        hirschprungs     Current Outpatient Medications:    cephALEXin  (KEFLEX ) 500 MG capsule, Take 1 capsule (500 mg total) by mouth 4 (four) times daily., Disp: 28 capsule, Rfl: 0   doxycycline  (VIBRAMYCIN ) 100 MG capsule, Take 1 capsule (100 mg total) by mouth 2 (two) times daily., Disp: 20 capsule, Rfl: 0   fluconazole  (DIFLUCAN ) 100 MG tablet, Take 1 tablet (100 mg total) by mouth daily. Take 1 tablet by mouth now repeat in 5 days, Disp: 2 tablet, Rfl: 0   metroNIDAZOLE  (FLAGYL ) 500 MG tablet, Take 1 tablet (500 mg total) by mouth 2 (two) times daily., Disp: 14 tablet, Rfl: 0   naproxen  (NAPROSYN ) 500 MG tablet, Take 1 tablet (500 mg total) by mouth 2 (two) times daily with a meal., Disp: 10 tablet, Rfl: 0   Allergies  Allergen Reactions   Tramadol  Nausea And Vomiting     Review of Systems   There were no vitals filed for this visit. There is no height or weight on file to calculate BMI.  Wt Readings from Last 3 Encounters:  11/28/23 209 lb (94.8 kg)  10/27/23 212 lb (96.2 kg)  10/24/23 212 lb (96.2 kg)    The 10-year ASCVD risk score (Arnett DK, et al., 2019) is: 0.7%   Values used to calculate the score:     Age: 29 years     Clincally relevant sex: Female     Is Non-Hispanic  African American: Yes     Diabetic: No     Tobacco smoker: Yes     Systolic Blood Pressure: 133 mmHg     Is BP treated: No     HDL Cholesterol: 68 mg/dL     Total Cholesterol: 160 mg/dL  Objective:  Physical Exam      Assessment And Plan:   Assessment & Plan Vaginal itching  Vaginal irritation   No orders of the defined types were placed in this encounter.    No follow-ups on file.  Patient was given opportunity to ask questions. Patient verbalized understanding of the plan and was able to repeat key elements of the plan. All questions were answered to their satisfaction.    LILLETTE [name], FNP, have reviewed all documentation for this visit. The documentation on 12/01/23 for the exam, diagnosis, procedures, and orders are all accurate and complete.   IF YOU HAVE BEEN REFERRED TO A SPECIALIST, IT MAY TAKE 1-2 WEEKS TO SCHEDULE/PROCESS THE REFERRAL. IF YOU HAVE NOT HEARD FROM US /SPECIALIST IN TWO WEEKS, PLEASE GIVE US  A CALL AT [phone] X [phone].

## 2023-12-01 NOTE — Progress Notes (Signed)
 [name], CMA,acting as a neurosurgeon for [name], FNP.,have documented all relevant documentation on the behalf of [name], FNP,as directed by  [name], FNP while in the presence of [name], FNP.  Subjective:  Patient ID: Dawn Brock , female    DOB: 1985/08/10 , 38 y.o.   MRN: [id number]  Chief Complaint  Patient presents with   Vaginal Itching    Patient reports she was told her pelvis was inflamed and she had BV she reports she is still having irritation and itching. She started medication from ER on 11/28/2023. She reports using a new soap 11/26/2023 and a sexual partner shortly after.     HPI  Discussed the use of AI scribe software for clinical note transcription with the patient, who gave verbal consent to proceed.  History of Present Illness Dawn Brock is a 38 year old female who presents with vaginal irritation and potential pelvic inflammatory disease (PID).  She experiences vaginal irritation, swelling, and itching, primarily on the labia, without discharge or odor. Testing for various infections was negative according to the patient. She is currently on Flagyl  and dianloxazine but is uncertain about their effectiveness.  She reports a slight increase in urinary frequency, which she attributes to aging, and is trying to stay hydrated by drinking fluids like cranberry water . No vaginal discharge or odor is present.  Her A1c was previously recorded at 5.9. Her weight was 209 lbs on November 28, 2023, but is currently 212 lbs. She is taking vitamins, including cranberry pills, CMOS, and biotin.  Her mother has stage four cancer affecting her tailbone and is currently bed-bound. She is seeking transportation options to move her mother back to [place]  for in-home hospice care, as her mother is currently in a hospital receiving pain management.  Past Medical History:  Diagnosis Date   Abnormal Pap smear    Anemia    Gastritis    Hx of  chlamydia infection    Vaginal Pap smear, abnormal      Family History  Problem Relation Age of Onset   Hypertension Mother    Fibromyalgia Mother    Cirrhosis Father    Kidney disease Father    Asthma Brother    Birth defects Son        hirschprungs     Current Outpatient Medications:    clobetasol  cream (TEMOVATE ) 0.05 %, Apply 1 Application topically 2 (two) times daily., Disp: 30 g, Rfl: 0   doxycycline  (VIBRAMYCIN ) 100 MG capsule, Take 1 capsule (100 mg total) by mouth 2 (two) times daily., Disp: 20 capsule, Rfl: 0   metroNIDAZOLE  (FLAGYL ) 500 MG tablet, Take 1 tablet (500 mg total) by mouth 2 (two) times daily., Disp: 14 tablet, Rfl: 0   fluconazole  (DIFLUCAN ) 100 MG tablet, Take 1 tablet (100 mg total) by mouth daily. Take 1 tablet by mouth now repeat in 5 days, Disp: 2 tablet, Rfl: 0   naproxen  (NAPROSYN ) 500 MG tablet, Take 1 tablet (500 mg total) by mouth 2 (two) times daily with a meal. (Patient not taking: Reported on 12/01/2023), Disp: 10 tablet, Rfl: 0   Allergies  Allergen Reactions   Tramadol  Nausea And Vomiting     Review of Systems  Constitutional: Negative.   Respiratory: Negative.    Cardiovascular: Negative.   Endocrine: Negative for polydipsia, polyphagia and polyuria.  Genitourinary:  Positive for frequency. Negative for vaginal discharge and vaginal pain.     Today's Vitals   12/01/23 1221  BP: 120/80  Pulse: 86  Temp: 98.5 F (36.9 C)  TempSrc: Oral  Weight: 212 lb 6.4 oz (96.3 kg)  Height: 5' 7 (1.702 m)  PainSc: 0-No pain   Body mass index is 33.27 kg/m.  Wt Readings from Last 3 Encounters:  12/01/23 212 lb 6.4 oz (96.3 kg)  11/28/23 209 lb (94.8 kg)  10/27/23 212 lb (96.2 kg)     Objective:  Physical Exam Vitals reviewed.  Constitutional:      General: She is not in acute distress.    Appearance: Normal appearance.  Cardiovascular:     Rate and Rhythm: Normal rate and regular rhythm.     Pulses: Normal pulses.     Heart  sounds: Normal heart sounds. No murmur heard. Pulmonary:     Effort: Pulmonary effort is normal. No respiratory distress.     Breath sounds: Normal breath sounds. No wheezing.  Neurological:     Mental Status: She is alert.      Assessment And Plan:   Assessment & Plan Vaginal itching Irritation and itching localized to the labia without discharge or odor. Differential includes yeast infection. - Prescribed Diflucan  (fluconazole ) 100 mg orally once now and repeat in five days. - Prescribed topical cream for external labia application, avoiding internal use. - Advised against using body wash in the vaginal area; recommended oatmeal soap without fragrance. Pre-diabetes A1c was 5.9%, indicating prediabetes. Advised exercise and carbohydrate reduction. - Ordered A1c test to monitor glucose levels. - Encouraged dietary modifications to reduce sugar intake and increase water  consumption. Vaginal discharge  Class 1 obesity due to excess calories without serious comorbidity with body mass index (BMI) of 33.0 to 33.9 in adult Weight at 212 lbs, previously 209 lbs. Encouraged increased physical activity and reduced carbohydrate intake. - Encouraged increased physical activity and reduction of carbohydrate intake.  Orders Placed This Encounter  Procedures   Hemoglobin A1c   NuSwab Vaginitis Plus (VG+)    Return for keep same next.  Patient was given opportunity to ask questions. Patient verbalized understanding of the plan and was able to repeat key elements of the plan. All questions were answered to their satisfaction.    LILLETTE [name], FNP, have reviewed all documentation for this visit. The documentation on 12/01/23 for the exam, diagnosis, procedures, and orders are all accurate and complete.   IF YOU HAVE BEEN REFERRED TO A SPECIALIST, IT MAY TAKE 1-2 WEEKS TO SCHEDULE/PROCESS THE REFERRAL. IF YOU HAVE NOT HEARD FROM US /SPECIALIST IN TWO WEEKS, PLEASE GIVE US  A CALL AT  [phone].

## 2023-12-04 LAB — NUSWAB VAGINITIS PLUS (VG+)
Candida albicans, NAA: POSITIVE — AB
Candida glabrata, NAA: NEGATIVE
Chlamydia trachomatis, NAA: NEGATIVE
Neisseria gonorrhoeae, NAA: NEGATIVE
Trich vag by NAA: NEGATIVE

## 2023-12-09 ENCOUNTER — Ambulatory Visit: Payer: Self-pay | Admitting: Nurse Practitioner

## 2023-12-09 DIAGNOSIS — N898 Other specified noninflammatory disorders of vagina: Secondary | ICD-10-CM | POA: Insufficient documentation

## 2023-12-09 DIAGNOSIS — R7303 Prediabetes: Secondary | ICD-10-CM | POA: Insufficient documentation

## 2023-12-09 NOTE — Assessment & Plan Note (Signed)
 Irritation and itching localized to the labia without discharge or odor. Differential includes yeast infection. - Prescribed Diflucan  (fluconazole ) 100 mg orally once now and repeat in five days. - Prescribed topical cream for external labia application, avoiding internal use. - Advised against using body wash in the vaginal area; recommended oatmeal soap without fragrance.

## 2023-12-09 NOTE — Assessment & Plan Note (Addendum)
 A1c was 5.9%, indicating prediabetes. Advised exercise and carbohydrate reduction. - Ordered A1c test to monitor glucose levels. - Encouraged dietary modifications to reduce sugar intake and increase water  consumption.

## 2023-12-09 NOTE — Assessment & Plan Note (Addendum)
 Weight at 212 lbs, previously 209 lbs. Encouraged increased physical activity and reduced carbohydrate intake. - Encouraged increased physical activity and reduction of carbohydrate intake.

## 2024-01-26 ENCOUNTER — Encounter: Payer: Self-pay | Admitting: Nurse Practitioner

## 2024-01-26 ENCOUNTER — Ambulatory Visit: Admitting: Nurse Practitioner

## 2024-01-26 VITALS — BP 116/70 | HR 85 | Temp 99.5°F | Ht 67.0 in | Wt 208.0 lb

## 2024-01-26 DIAGNOSIS — F331 Major depressive disorder, recurrent, moderate: Secondary | ICD-10-CM | POA: Diagnosis not present

## 2024-01-26 DIAGNOSIS — Z6832 Body mass index (BMI) 32.0-32.9, adult: Secondary | ICD-10-CM | POA: Diagnosis not present

## 2024-01-26 DIAGNOSIS — R5382 Chronic fatigue, unspecified: Secondary | ICD-10-CM | POA: Diagnosis not present

## 2024-01-26 DIAGNOSIS — R350 Frequency of micturition: Secondary | ICD-10-CM | POA: Diagnosis not present

## 2024-01-26 DIAGNOSIS — Z113 Encounter for screening for infections with a predominantly sexual mode of transmission: Secondary | ICD-10-CM | POA: Diagnosis not present

## 2024-01-26 DIAGNOSIS — E66811 Obesity, class 1: Secondary | ICD-10-CM

## 2024-01-26 DIAGNOSIS — E6609 Other obesity due to excess calories: Secondary | ICD-10-CM | POA: Diagnosis not present

## 2024-01-26 DIAGNOSIS — Z1231 Encounter for screening mammogram for malignant neoplasm of breast: Secondary | ICD-10-CM | POA: Diagnosis not present

## 2024-01-26 NOTE — Progress Notes (Signed)
 [name], CMA,acting as a neurosurgeon for [name], FNP.,have documented all relevant documentation on the behalf of [name], FNP,as directed by  [name], FNP while in the presence of [name], FNP.  Subjective:  Patient ID: Dawn Brock , female    DOB: 27-Mar-1985 , 39 y.o.   MRN: [id number]  Chief Complaint  Patient presents with   Urinary Frequency    Patient reports she has been frequent urination since last week. Patient has no other symptoms.       HPI  Discussed the use of AI scribe software for clinical note transcription with the patient, who gave verbal consent to proceed.  History of Present Illness Dawn Brock is a 39 year old female who presents with urinary frequency and fatigue.  She has been experiencing urinary frequency for the past week, characterized by a sensation of bladder fullness without dysuria or discharge. She is concerned about her sugar intake, noting cravings for sweets and dietary inconsistencies.  She reports significant fatigue and lack of energy despite taking vitamins in the morning. She feels more tired this year compared to last year, particularly after getting her children on the bus in the morning. She questions if low iron could be a factor.  Socially, she feels overwhelmed with her responsibilities as a mother and has not sought counseling. She has a 18 year old daughter and a 64 year old son who is about to graduate. She is concerned about her daughter's weight and emotional state.  No burning on urination or discharge. Reports fatigue and lack of energy.  Past Medical History:  Diagnosis Date   Abnormal Pap smear    Anemia    Gastritis    Hx of chlamydia infection    Vaginal Pap smear, abnormal      Family History  Problem Relation Age of Onset   Hypertension Mother    Fibromyalgia Mother    Cirrhosis Father    Kidney disease Father    Asthma Brother    Birth defects Son        hirschprungs     Current Medications[1]   Allergies[2]   Review of Systems  Constitutional: Negative.   Respiratory: Negative.    Cardiovascular: Negative.   Genitourinary:  Positive for frequency. Negative for hematuria and urgency.       She reports her bladder fills up   Neurological: Negative.   Psychiatric/Behavioral: Negative.       Today's Vitals   01/26/24 1549  BP: 116/70  Pulse: 85  Temp: 99.5 F (37.5 C)  TempSrc: Oral  Weight: 208 lb (94.3 kg)  Height: 5' 7 (1.702 m)  PainSc: 0-No pain   Body mass index is 32.58 kg/m.  Wt Readings from Last 3 Encounters:  01/26/24 208 lb (94.3 kg)  12/01/23 212 lb 6.4 oz (96.3 kg)  11/28/23 209 lb (94.8 kg)     Objective:  Physical Exam Vitals and nursing note reviewed.  Constitutional:      General: She is not in acute distress.    Appearance: Normal appearance. She is obese.  Cardiovascular:     Rate and Rhythm: Normal rate and regular rhythm.     Pulses: Normal pulses.     Heart sounds: Normal heart sounds. No murmur heard. Pulmonary:     Effort: Pulmonary effort is normal. No respiratory distress.     Breath sounds: Normal breath sounds. No wheezing.  Neurological:     Mental Status: She is alert.  09/30/2023    3:44 PM 09/29/2022    3:31 PM 09/18/2021    3:25 PM 09/05/2018   11:57 AM  Depression screen PHQ 2/9  Decreased Interest 0 0 0 0  Down, Depressed, Hopeless 0 0 0 0  PHQ - 2 Score 0 0 0 0  Altered sleeping 0 0    Tired, decreased energy 0 0    Change in appetite 0 0    Feeling bad or failure about yourself  0 0    Trouble concentrating 0 0    Moving slowly or fidgety/restless 0 0    Suicidal thoughts 0 0    PHQ-9 Score 0  0     Difficult doing work/chores Not difficult at all Not difficult at all       Data saved with a previous flowsheet row definition    Assessment And Plan:   Assessment & Plan Urinary frequency Concern for diabetes due to pre-diabetes and dietary habits. - Ordered  urinalysis. - Ordered A1c test. Screening for STDs (sexually transmitted diseases)  Class 1 obesity due to excess calories with body mass index (BMI) of 32.0 to 32.9 in adult, unspecified whether serious comorbidity present  Encounter for screening mammogram for breast cancer  Chronic fatigue - Ordered labs to evaluate for anemia and other causes of fatigue. Moderate episode of recurrent major depressive disorder (HCC) Prefers natural remedies over medication. Counseling suggested. - Referred to in-house counselor, Tawni, for initial counseling. - Screen for anxiety and depression. Consent received.  Orders Placed This Encounter  Procedures   Culture, Urine   MM Digital Screening   Urinalysis, Complete   NuSwab Vaginitis Plus (VG+)   TSH   Iron, TIBC and Ferritin Panel   Vitamin B12   Vitamin D  (25 hydroxy)   Hemoglobin A1c     Return for keep same next; schedule with [facility].  Patient was given opportunity to ask questions. Patient verbalized understanding of the plan and was able to repeat key elements of the plan. All questions were answered to their satisfaction.   Patient and/or legal guardian verbally consented to [facility] services about presenting concerns and psychiatric consultation as appropriate.  The services will be billed as appropriate for the patient   I, [name], FNP, have reviewed all documentation for this visit. The documentation on 01/26/2024 for the exam, diagnosis, procedures, and orders are all accurate and complete.   IF YOU HAVE BEEN REFERRED TO A SPECIALIST, IT MAY TAKE 1-2 WEEKS TO SCHEDULE/PROCESS THE REFERRAL. IF YOU HAVE NOT HEARD FROM US /SPECIALIST IN TWO WEEKS, PLEASE GIVE US  A CALL AT [phone].      [1]  Current Outpatient Medications:    clobetasol  cream (TEMOVATE ) 0.05 %, Apply 1 Application topically 2 (two) times daily., Disp: 30 g, Rfl: 0   doxycycline  (VIBRAMYCIN ) 100 MG capsule, Take 1  capsule (100 mg total) by mouth 2 (two) times daily., Disp: 20 capsule, Rfl: 0   fluconazole  (DIFLUCAN ) 100 MG tablet, Take 1 tablet (100 mg total) by mouth daily. Take 1 tablet by mouth now repeat in 5 days (Patient not taking: Reported on 01/26/2024), Disp: 2 tablet, Rfl: 0   metroNIDAZOLE  (FLAGYL ) 500 MG tablet, Take 1 tablet (500 mg total) by mouth 2 (two) times daily. (Patient not taking: Reported on 01/26/2024), Disp: 14 tablet, Rfl: 0   naproxen  (NAPROSYN ) 500 MG tablet, Take 1 tablet (500 mg total) by mouth 2 (two) times daily with a meal. (Patient not  taking: Reported on 01/26/2024), Disp: 10 tablet, Rfl: 0 [2]  Allergies Allergen Reactions   Tramadol  Nausea And Vomiting

## 2024-01-27 ENCOUNTER — Ambulatory Visit: Payer: Self-pay

## 2024-01-27 ENCOUNTER — Ambulatory Visit: Payer: Self-pay | Admitting: Licensed Clinical Social Worker

## 2024-01-27 ENCOUNTER — Ambulatory Visit: Payer: Self-pay | Admitting: Nurse Practitioner

## 2024-01-27 DIAGNOSIS — E0590 Thyrotoxicosis, unspecified without thyrotoxic crisis or storm: Secondary | ICD-10-CM

## 2024-01-27 DIAGNOSIS — Z91199 Patient's noncompliance with other medical treatment and regimen due to unspecified reason: Secondary | ICD-10-CM

## 2024-01-27 DIAGNOSIS — B9689 Other specified bacterial agents as the cause of diseases classified elsewhere: Secondary | ICD-10-CM

## 2024-01-27 LAB — IRON,TIBC AND FERRITIN PANEL
Ferritin: 28 ng/mL (ref 15–150)
Iron Saturation: 22 % (ref 15–55)
Iron: 95 ug/dL (ref 27–159)
Total Iron Binding Capacity: 424 ug/dL (ref 250–450)
UIBC: 329 ug/dL (ref 131–425)

## 2024-01-27 LAB — URINALYSIS, COMPLETE
Bilirubin, UA: NEGATIVE
Glucose, UA: NEGATIVE
Ketones, UA: NEGATIVE
Leukocytes,UA: NEGATIVE
Nitrite, UA: NEGATIVE
Protein,UA: NEGATIVE
RBC, UA: NEGATIVE
Specific Gravity, UA: 1.028 (ref 1.005–1.030)
Urobilinogen, Ur: 1 mg/dL (ref 0.2–1.0)
pH, UA: 6 (ref 5.0–7.5)

## 2024-01-27 LAB — MICROSCOPIC EXAMINATION
Casts: NONE SEEN /LPF
WBC, UA: NONE SEEN /HPF (ref 0–5)

## 2024-01-27 LAB — VITAMIN D 25 HYDROXY (VIT D DEFICIENCY, FRACTURES): Vit D, 25-Hydroxy: 20.6 ng/mL — ABNORMAL LOW (ref 30.0–100.0)

## 2024-01-27 LAB — TSH: TSH: 0.415 u[IU]/mL — ABNORMAL LOW (ref 0.450–4.500)

## 2024-01-27 LAB — HEMOGLOBIN A1C
Est. average glucose Bld gHb Est-mCnc: 123 mg/dL
Hgb A1c MFr Bld: 5.9 % — ABNORMAL HIGH (ref 4.8–5.6)

## 2024-01-27 LAB — VITAMIN B12: Vitamin B-12: 832 pg/mL (ref 232–1245)

## 2024-01-27 MED ORDER — NITROFURANTOIN MONOHYD MACRO 100 MG PO CAPS: 100.0000 mg | ORAL_CAPSULE | Freq: Two times a day (BID) | ORAL | 0 refills | Status: AC

## 2024-01-27 NOTE — Telephone Encounter (Signed)
 FYI Only or Action Required?: Action required by provider: lab or test result follow-up needed.  Patient was last seen in primary care on 01/26/2024 by Georgina Speaks, FNP.  Called Nurse Triage reporting Fatigue.  Symptoms began a week ago.  Interventions attempted: Rest, hydration, or home remedies.  Symptoms are: gradually worsening.  Triage Disposition: Call PCP Now (overriding See HCP Within 4 Hours (Or PCP Triage))  Patient/caregiver understands and will follow disposition?: Yes    Saw PCP yesterday for urinary frequency and weakness. Got UA vaginal swab and lab work done. Pt wanting to know what the results are and if something can be prescribed for persistent moderate-severe weakness x1 week and urinary frequency. Denies SOB or CP or fever. Has not fainted. Able to walk around.   Message from CMA to pt states that it would take 7-10 business days to go over results. Pt states she can't wait that long. Called [facility] and spoke with Chevonne and nurse Sierra. She will relay message to PCP to review labs and get back with pt. Advised pt to seek out UC or ED in the meantime for worsening symptoms.    Copied from CRM [id number]. Topic: Clinical - Red Word Triage >> Jan 27, 2024  3:30 PM Dawn Brock wrote: Red Word that prompted transfer to Nurse Triage: Pt is feeling weak and is wanting to know if medication should be prescribed. She is not feeling well. Vitamin D  and thyroid levels are low. Pt feels like she is going to pass out. Transferred to NT Reason for Disposition  [1] MODERATE weakness (e.g., interferes with work, school, normal activities) AND [2] cause unknown  (Exceptions: Weakness from acute minor illness or poor fluid intake; weakness is chronic and not worse.)  Answer Assessment - Initial Assessment Questions 1. DESCRIPTION: Describe how you are feeling.     Very weak  2. SEVERITY: How bad is it?  Can you stand and walk?     Moderate to severe fatigue, able to walk  around  3. ONSET: When did these symptoms begin? (e.g., hours, days, weeks, months)     Last week  4. CAUSE: What do you think is causing the weakness or fatigue? (e.g., not drinking enough fluids, medical problem, trouble sleeping)     Labs are off  5. NEW MEDICINES:  Have you started on any new medicines recently? (e.g., opioid pain medicines, benzodiazepines, muscle relaxants, antidepressants, antihistamines, neuroleptics, beta blockers)     Denies  6. OTHER SYMPTOMS: Do you have any other symptoms? (e.g., chest pain, fever, cough, SOB, vomiting, diarrhea, bleeding, other areas of pain)     Urinary frequency  7. PREGNANCY: Is there any chance you are pregnant? When was your last menstrual period?     Denies  Protocols used: Weakness (Generalized) and Fatigue-A-AH

## 2024-01-27 NOTE — BH Specialist Note (Signed)
 [facility] Collaborative Care Clinician attempted to connect with Dawn Brock for an appointment scheduled 01/27/24 at 11:00 AM EST in office. Patient did not show up for the appointment. After waiting 15 minutes, clinician attempted to reach pt by phone to offer virtual visit.   Pt did not answer phone and clinician left HIPAA compliant voicemail for pt to call office to reschedule appointment.    Clinician spent total of 15 minutes attempting to connect with patient for scheduled visit.

## 2024-01-28 LAB — NUSWAB VAGINITIS PLUS (VG+)
Atopobium vaginae: HIGH {score} — AB
BVAB 2: HIGH {score} — AB
Candida albicans, NAA: NEGATIVE
Candida glabrata, NAA: NEGATIVE
Chlamydia trachomatis, NAA: NEGATIVE
Megasphaera 1: HIGH {score} — AB
Neisseria gonorrhoeae, NAA: NEGATIVE
Trich vag by NAA: NEGATIVE

## 2024-01-28 LAB — URINE CULTURE

## 2024-02-06 DIAGNOSIS — F331 Major depressive disorder, recurrent, moderate: Secondary | ICD-10-CM | POA: Insufficient documentation

## 2024-02-06 DIAGNOSIS — R5382 Chronic fatigue, unspecified: Secondary | ICD-10-CM | POA: Insufficient documentation

## 2024-02-06 MED ORDER — METRONIDAZOLE 500 MG PO TABS: 500.0000 mg | ORAL_TABLET | Freq: Two times a day (BID) | ORAL | 0 refills | Status: AC

## 2024-02-06 NOTE — Assessment & Plan Note (Signed)
-   Ordered labs to evaluate for anemia and other causes of fatigue.

## 2024-02-06 NOTE — Assessment & Plan Note (Signed)
 Concern for diabetes due to pre-diabetes and dietary habits. - Ordered urinalysis. - Ordered A1c test.

## 2024-02-06 NOTE — Assessment & Plan Note (Signed)
 Prefers natural remedies over medication. Counseling suggested. - Referred to in-house counselor, Tawni, for initial counseling. - Screen for anxiety and depression. Consent received.

## 2024-02-10 ENCOUNTER — Institutional Professional Consult (permissible substitution): Payer: Self-pay | Admitting: Licensed Clinical Social Worker

## 2024-10-05 ENCOUNTER — Encounter: Payer: Self-pay | Admitting: Nurse Practitioner
